# Patient Record
Sex: Male | Born: 1935
Health system: Southern US, Community
[De-identification: ages and names within clinical notes are randomized; demographics above are authoritative.]

## PROBLEM LIST (undated history)

## (undated) DIAGNOSIS — I219 Acute myocardial infarction, unspecified: Secondary | ICD-10-CM

## (undated) DIAGNOSIS — N189 Chronic kidney disease, unspecified: Secondary | ICD-10-CM

## (undated) DIAGNOSIS — M1990 Unspecified osteoarthritis, unspecified site: Secondary | ICD-10-CM

## (undated) DIAGNOSIS — D649 Anemia, unspecified: Secondary | ICD-10-CM

## (undated) DIAGNOSIS — F329 Major depressive disorder, single episode, unspecified: Secondary | ICD-10-CM

## (undated) DIAGNOSIS — I8392 Asymptomatic varicose veins of left lower extremity: Secondary | ICD-10-CM

## (undated) DIAGNOSIS — K219 Gastro-esophageal reflux disease without esophagitis: Secondary | ICD-10-CM

## (undated) DIAGNOSIS — R011 Cardiac murmur, unspecified: Secondary | ICD-10-CM

## (undated) DIAGNOSIS — F32A Depression, unspecified: Secondary | ICD-10-CM

## (undated) DIAGNOSIS — I10 Essential (primary) hypertension: Secondary | ICD-10-CM

## (undated) DIAGNOSIS — M109 Gout, unspecified: Secondary | ICD-10-CM

## (undated) DIAGNOSIS — H269 Unspecified cataract: Secondary | ICD-10-CM

## (undated) DIAGNOSIS — F419 Anxiety disorder, unspecified: Secondary | ICD-10-CM

## (undated) DIAGNOSIS — E119 Type 2 diabetes mellitus without complications: Secondary | ICD-10-CM

## (undated) HISTORY — PX: OTHER SURGICAL HISTORY: SHX169

## (undated) HISTORY — PX: BUNIONECTOMY: SHX129

---

## 2011-06-10 ENCOUNTER — Other Ambulatory Visit: Payer: Self-pay | Admitting: Hematology and Oncology

## 2011-06-10 ENCOUNTER — Encounter (HOSPITAL_BASED_OUTPATIENT_CLINIC_OR_DEPARTMENT_OTHER): Payer: Medicare Other | Admitting: Hematology and Oncology

## 2011-06-10 ENCOUNTER — Ambulatory Visit (HOSPITAL_COMMUNITY)
Admission: RE | Admit: 2011-06-10 | Discharge: 2011-06-10 | Disposition: A | Discharge status: Home / Self Care | Payer: Medicare Other | Source: Ambulatory Visit | Attending: Hematology and Oncology | Admitting: Hematology and Oncology

## 2011-06-10 DIAGNOSIS — R0602 Shortness of breath: Secondary | ICD-10-CM | POA: Insufficient documentation

## 2011-06-10 DIAGNOSIS — Z862 Personal history of diseases of the blood and blood-forming organs and certain disorders involving the immune mechanism: Secondary | ICD-10-CM

## 2011-06-10 DIAGNOSIS — F17200 Nicotine dependence, unspecified, uncomplicated: Secondary | ICD-10-CM

## 2011-06-10 DIAGNOSIS — Z87891 Personal history of nicotine dependence: Secondary | ICD-10-CM | POA: Insufficient documentation

## 2011-06-10 DIAGNOSIS — I10 Essential (primary) hypertension: Secondary | ICD-10-CM | POA: Insufficient documentation

## 2011-06-10 DIAGNOSIS — D539 Nutritional anemia, unspecified: Secondary | ICD-10-CM

## 2011-06-10 LAB — URINALYSIS, MICROSCOPIC - CHCC
Glucose: NEGATIVE g/dL
Nitrite: NEGATIVE
RBC count: NEGATIVE (ref 0–2)
Specific Gravity, Urine: 1.02 (ref 1.003–1.035)

## 2011-06-10 LAB — CBC & DIFF AND RETIC
BASO%: 0.3 % (ref 0.0–2.0)
Basophils Absolute: 0 10*3/uL (ref 0.0–0.1)
EOS%: 1.7 % (ref 0.0–7.0)
HGB: 10.3 g/dL — ABNORMAL LOW (ref 13.0–17.1)
MCH: 22 pg — ABNORMAL LOW (ref 27.2–33.4)
MCHC: 32.3 g/dL (ref 32.0–36.0)
MCV: 68 fL — ABNORMAL LOW (ref 79.3–98.0)
MONO%: 5.9 % (ref 0.0–14.0)
RDW: 17.8 % — ABNORMAL HIGH (ref 11.0–14.6)
Retic %: 1.62 % (ref 0.80–1.80)
Retic Ct Abs: 75.98 10*3/uL (ref 34.80–93.90)
lymph#: 1.9 10*3/uL (ref 0.9–3.3)

## 2011-06-15 LAB — PROTEIN ELECTROPHORESIS, SERUM, WITH REFLEX
Alpha-2-Globulin: 12.4 % — ABNORMAL HIGH (ref 7.1–11.8)
Total Protein, Serum Electrophoresis: 8 g/dL (ref 6.0–8.3)

## 2011-06-15 LAB — HEMOGLOBINOPATHY EVALUATION
Hemoglobin Other: 0 % (ref 0.0–0.0)
Hgb A2 Quant: 2.5 % (ref 2.2–3.2)
Hgb A: 97.5 % (ref 96.8–97.8)

## 2011-06-15 LAB — VITAMIN B12: Vitamin B-12: 419 pg/mL (ref 211–911)

## 2011-06-15 LAB — COMPREHENSIVE METABOLIC PANEL
Albumin: 4.6 g/dL (ref 3.5–5.2)
BUN: 23 mg/dL (ref 6–23)
CO2: 18 mEq/L — ABNORMAL LOW (ref 19–32)
Calcium: 10.1 mg/dL (ref 8.4–10.5)
Chloride: 110 mEq/L (ref 96–112)
Creatinine, Ser: 1.62 mg/dL — ABNORMAL HIGH (ref 0.50–1.35)

## 2011-06-15 LAB — HAPTOGLOBIN: Haptoglobin: 232 mg/dL — ABNORMAL HIGH (ref 30–200)

## 2011-06-15 LAB — IRON AND TIBC
%SAT: 10 % — ABNORMAL LOW (ref 20–55)
Iron: 43 ug/dL (ref 42–165)
TIBC: 447 ug/dL — ABNORMAL HIGH (ref 215–435)

## 2011-06-17 ENCOUNTER — Other Ambulatory Visit: Payer: Self-pay | Admitting: Hematology and Oncology

## 2011-06-17 ENCOUNTER — Encounter (HOSPITAL_BASED_OUTPATIENT_CLINIC_OR_DEPARTMENT_OTHER): Payer: Medicare Other | Admitting: Hematology and Oncology

## 2011-06-17 DIAGNOSIS — D539 Nutritional anemia, unspecified: Secondary | ICD-10-CM

## 2011-06-17 DIAGNOSIS — R112 Nausea with vomiting, unspecified: Secondary | ICD-10-CM

## 2011-06-17 DIAGNOSIS — N2589 Other disorders resulting from impaired renal tubular function: Secondary | ICD-10-CM

## 2011-06-23 ENCOUNTER — Other Ambulatory Visit: Payer: Self-pay | Admitting: Hematology and Oncology

## 2011-06-23 DIAGNOSIS — R634 Abnormal weight loss: Secondary | ICD-10-CM

## 2011-06-23 DIAGNOSIS — R05 Cough: Secondary | ICD-10-CM

## 2011-06-23 DIAGNOSIS — R9389 Abnormal findings on diagnostic imaging of other specified body structures: Secondary | ICD-10-CM

## 2011-06-24 ENCOUNTER — Encounter (HOSPITAL_BASED_OUTPATIENT_CLINIC_OR_DEPARTMENT_OTHER): Payer: Medicare Other | Admitting: Hematology and Oncology

## 2011-06-24 DIAGNOSIS — D509 Iron deficiency anemia, unspecified: Secondary | ICD-10-CM

## 2011-06-26 ENCOUNTER — Ambulatory Visit (HOSPITAL_COMMUNITY)
Admission: RE | Admit: 2011-06-26 | Discharge: 2011-06-26 | Disposition: A | Discharge status: Home / Self Care | Payer: Medicare Other | Source: Ambulatory Visit | Attending: Hematology and Oncology | Admitting: Hematology and Oncology

## 2011-06-26 DIAGNOSIS — R05 Cough: Secondary | ICD-10-CM

## 2011-06-26 DIAGNOSIS — J438 Other emphysema: Secondary | ICD-10-CM | POA: Insufficient documentation

## 2011-06-26 DIAGNOSIS — R9389 Abnormal findings on diagnostic imaging of other specified body structures: Secondary | ICD-10-CM | POA: Insufficient documentation

## 2011-06-26 DIAGNOSIS — R634 Abnormal weight loss: Secondary | ICD-10-CM | POA: Insufficient documentation

## 2011-06-26 DIAGNOSIS — E041 Nontoxic single thyroid nodule: Secondary | ICD-10-CM | POA: Insufficient documentation

## 2011-06-26 DIAGNOSIS — D649 Anemia, unspecified: Secondary | ICD-10-CM | POA: Insufficient documentation

## 2011-06-26 DIAGNOSIS — R059 Cough, unspecified: Secondary | ICD-10-CM | POA: Insufficient documentation

## 2011-06-26 MED ORDER — IOHEXOL 300 MG/ML  SOLN
50.0000 mL | Freq: Once | INTRAMUSCULAR | Status: AC | PRN
  Administered 2011-06-26: 50 mL via INTRAVENOUS

## 2011-11-04 DIAGNOSIS — J00 Acute nasopharyngitis [common cold]: Secondary | ICD-10-CM | POA: Diagnosis not present

## 2011-11-04 DIAGNOSIS — Z79899 Other long term (current) drug therapy: Secondary | ICD-10-CM | POA: Diagnosis not present

## 2011-11-04 DIAGNOSIS — IMO0001 Reserved for inherently not codable concepts without codable children: Secondary | ICD-10-CM | POA: Diagnosis not present

## 2011-11-04 DIAGNOSIS — M109 Gout, unspecified: Secondary | ICD-10-CM | POA: Diagnosis not present

## 2011-11-04 DIAGNOSIS — I10 Essential (primary) hypertension: Secondary | ICD-10-CM | POA: Diagnosis not present

## 2011-12-02 ENCOUNTER — Other Ambulatory Visit (HOSPITAL_BASED_OUTPATIENT_CLINIC_OR_DEPARTMENT_OTHER): Payer: Medicare Other | Admitting: Lab

## 2011-12-02 DIAGNOSIS — D539 Nutritional anemia, unspecified: Secondary | ICD-10-CM

## 2011-12-02 LAB — CBC WITH DIFFERENTIAL/PLATELET
Basophils Absolute: 0 10*3/uL (ref 0.0–0.1)
EOS%: 3.9 % (ref 0.0–7.0)
Eosinophils Absolute: 0.3 10*3/uL (ref 0.0–0.5)
HGB: 9.3 g/dL — ABNORMAL LOW (ref 13.0–17.1)
LYMPH%: 32.5 % (ref 14.0–49.0)
MCH: 23.5 pg — ABNORMAL LOW (ref 27.2–33.4)
MCV: 74 fL — ABNORMAL LOW (ref 79.3–98.0)
MONO%: 9.9 % (ref 0.0–14.0)
NEUT#: 3.7 10*3/uL (ref 1.5–6.5)
Platelets: 215 10*3/uL (ref 140–400)
RBC: 3.98 10*6/uL — ABNORMAL LOW (ref 4.20–5.82)

## 2011-12-03 LAB — COMPREHENSIVE METABOLIC PANEL
Alkaline Phosphatase: 122 U/L — ABNORMAL HIGH (ref 39–117)
CO2: 17 mEq/L — ABNORMAL LOW (ref 19–32)
Creatinine, Ser: 1.68 mg/dL — ABNORMAL HIGH (ref 0.50–1.35)
Glucose, Bld: 115 mg/dL — ABNORMAL HIGH (ref 70–99)
Total Bilirubin: 0.2 mg/dL — ABNORMAL LOW (ref 0.3–1.2)

## 2011-12-03 LAB — IRON AND TIBC
%SAT: 17 % — ABNORMAL LOW (ref 20–55)
Iron: 60 ug/dL (ref 42–165)
TIBC: 347 ug/dL (ref 215–435)
UIBC: 287 ug/dL (ref 125–400)

## 2011-12-03 LAB — VITAMIN B12: Vitamin B-12: 424 pg/mL (ref 211–911)

## 2011-12-03 LAB — FERRITIN: Ferritin: 113 ng/mL (ref 22–322)

## 2011-12-09 ENCOUNTER — Encounter: Payer: Self-pay | Admitting: Hematology and Oncology

## 2011-12-09 ENCOUNTER — Telehealth: Payer: Self-pay | Admitting: Hematology and Oncology

## 2011-12-09 ENCOUNTER — Ambulatory Visit (HOSPITAL_BASED_OUTPATIENT_CLINIC_OR_DEPARTMENT_OTHER): Payer: Medicare Other | Admitting: Hematology and Oncology

## 2011-12-09 VITALS — BP 118/69 | HR 72 | Temp 97.7°F | Ht 72.0 in | Wt 205.0 lb

## 2011-12-09 DIAGNOSIS — D509 Iron deficiency anemia, unspecified: Secondary | ICD-10-CM | POA: Diagnosis not present

## 2011-12-09 DIAGNOSIS — N289 Disorder of kidney and ureter, unspecified: Secondary | ICD-10-CM

## 2011-12-09 DIAGNOSIS — D638 Anemia in other chronic diseases classified elsewhere: Secondary | ICD-10-CM | POA: Diagnosis not present

## 2011-12-09 DIAGNOSIS — R3129 Other microscopic hematuria: Secondary | ICD-10-CM

## 2011-12-09 DIAGNOSIS — E119 Type 2 diabetes mellitus without complications: Secondary | ICD-10-CM | POA: Insufficient documentation

## 2011-12-09 DIAGNOSIS — D539 Nutritional anemia, unspecified: Secondary | ICD-10-CM

## 2011-12-09 DIAGNOSIS — Z794 Long term (current) use of insulin: Secondary | ICD-10-CM | POA: Insufficient documentation

## 2011-12-09 NOTE — Progress Notes (Signed)
CC:   Robyn N. Allyne Gee, M.D.  IDENTIFYING STATEMENT:  The patient is a 78 year old man with anemia who presents for followup.  INTERVAL HISTORY:  [name] was last seen 6 months ago.  He notes he has gained weight.  He is eating well.  He denies pain.  He denies overt blood loss, but does have history of chronic hematuria.  He has yet not established care with a urologist in this area and would like to do so. He is taking over-the-counter iron 1 daily which he is tolerating very well with no GI upset.  He has good energy levels.  He had received a CT scan at his last visit in October 2012 because I was concerned about some peribronchial thickening, but there was no evidence of malignancy. The thoracic spine did show some lucent lesions, but myeloma markers were unremarkable.  MEDICATIONS:  Reviewed and updated.  ALLERGIES:  None.  PHYSICAL EXAM:  The patient is a well-appearing, well-nourished man in no distress.  Vitals:  Pulse 72, blood pressure 108/69, temp 97.7, respirations 20, weight 205 pounds.  HEENT:  Head is atraumatic, normocephalic.  Sclerae anicteric.  Mouth moist.  Neck:  Supple.  Chest: Clear.  Cardiovascular:  Unremarkable.  Abdomen:  Soft, nontender. Bowel sounds present.  Extremities:  No edema.  LAB DATA:  12/02/2011 white cell count 6.9, hemoglobin 9.3, hematocrit 29.4, platelets 215, MCV 74.  Sodium 139, potassium 5.4, chloride 112, CO2 17, glucose 115, BUN 24, creatinine 1.68, T bilirubin 0.2, alkaline phosphatase 122, AST 19, ALT 15, calcium 10, ferritin 113, iron 60, TIBC 247, saturation 17%, B12 424.  IMPRESSION/PLAN:  Mr. Hegeman is a 78 year old man with: 1. Iron deficiency anemia with microscopic hematuria.  His ferritin     store is adequate, so I have asked that he continue with oral iron     as he is already doing. 2. Anemia of chronic disease with underlying diabetes and renal     insufficiency. 3. The patient has microscopic hematuria and will  assist him with a     referral to urology.  He follows up in 9 months' time.    ______________________________ Laurice Record, M.D. LIO/MEDQ  D:  12/09/2011  T:  12/09/2011  Job:  [id number]

## 2011-12-09 NOTE — Progress Notes (Signed)
This office note has been dictated.

## 2011-12-09 NOTE — Telephone Encounter (Signed)
gv pt appt schedule for jan 2014. Also gv pt appt w/dr ottelin for 4/30 @ 9:15 am. 1st available appt was w/dr ottelin otherwise next new pt appt was July. Urology appt made per 4/17 pof.

## 2011-12-09 NOTE — Patient Instructions (Signed)
Patient to follow up as instructed.   Current Outpatient Prescriptions  Medication Sig Dispense Refill  . allopurinol (ZYLOPRIM) 100 MG tablet Take 100 mg by mouth 2 (two) times daily as needed.      Marland Kitchen amLODipine (NORVASC) 5 MG tablet Take 5 mg by mouth daily.      Marland Kitchen aspirin 81 MG tablet Take 81 mg by mouth daily.      . carvedilol (COREG) 25 MG tablet Take 25 mg by mouth 2 (two) times daily with a meal.      . clopidogrel (PLAVIX) 75 MG tablet Take 75 mg by mouth daily.      Marland Kitchen omeprazole (PRILOSEC) 20 MG capsule Take 20 mg by mouth daily.      . sitaGLIPtan-metformin (JANUMET) 50-1000 MG per tablet Take 1 tablet by mouth 2 (two) times daily with a meal.      . Tamsulosin HCl (FLOMAX) 0.4 MG CAPS Take 0.4 mg by mouth 2 (two) times daily.            April 2013  Sunday Monday Tuesday Wednesday Thursday Friday Saturday      1   2   3   4   5   6    7   8   9   10    LAB MO     1:45 PM  (15 min.)  Windell Hummingbird  [facility] 11   12   13    14   15   16   17    EST PT 30  11:30 AM  (30 min.)  Laurice Record, MD  [facility] 18   19   20    21   22   23   24   25   26   27    28   29    30

## 2011-12-22 DIAGNOSIS — N281 Cyst of kidney, acquired: Secondary | ICD-10-CM | POA: Diagnosis not present

## 2011-12-22 DIAGNOSIS — N529 Male erectile dysfunction, unspecified: Secondary | ICD-10-CM | POA: Diagnosis not present

## 2011-12-22 DIAGNOSIS — R3129 Other microscopic hematuria: Secondary | ICD-10-CM | POA: Diagnosis not present

## 2011-12-22 DIAGNOSIS — E291 Testicular hypofunction: Secondary | ICD-10-CM | POA: Diagnosis not present

## 2011-12-22 DIAGNOSIS — N401 Enlarged prostate with lower urinary tract symptoms: Secondary | ICD-10-CM | POA: Diagnosis not present

## 2012-02-04 DIAGNOSIS — D6489 Other specified anemias: Secondary | ICD-10-CM | POA: Diagnosis not present

## 2012-02-04 DIAGNOSIS — Z79899 Other long term (current) drug therapy: Secondary | ICD-10-CM | POA: Diagnosis not present

## 2012-02-04 DIAGNOSIS — M109 Gout, unspecified: Secondary | ICD-10-CM | POA: Diagnosis not present

## 2012-02-04 DIAGNOSIS — I10 Essential (primary) hypertension: Secondary | ICD-10-CM | POA: Diagnosis not present

## 2012-02-05 DIAGNOSIS — Z79899 Other long term (current) drug therapy: Secondary | ICD-10-CM | POA: Diagnosis not present

## 2012-02-05 DIAGNOSIS — D6489 Other specified anemias: Secondary | ICD-10-CM | POA: Diagnosis not present

## 2012-02-05 DIAGNOSIS — M109 Gout, unspecified: Secondary | ICD-10-CM | POA: Diagnosis not present

## 2012-02-12 DIAGNOSIS — I70209 Unspecified atherosclerosis of native arteries of extremities, unspecified extremity: Secondary | ICD-10-CM | POA: Diagnosis not present

## 2012-02-12 DIAGNOSIS — M722 Plantar fascial fibromatosis: Secondary | ICD-10-CM | POA: Diagnosis not present

## 2012-02-12 DIAGNOSIS — B351 Tinea unguium: Secondary | ICD-10-CM | POA: Diagnosis not present

## 2012-02-12 DIAGNOSIS — M79609 Pain in unspecified limb: Secondary | ICD-10-CM | POA: Diagnosis not present

## 2012-02-12 DIAGNOSIS — E1159 Type 2 diabetes mellitus with other circulatory complications: Secondary | ICD-10-CM | POA: Diagnosis not present

## 2012-02-26 DIAGNOSIS — E213 Hyperparathyroidism, unspecified: Secondary | ICD-10-CM | POA: Diagnosis not present

## 2012-02-26 DIAGNOSIS — E119 Type 2 diabetes mellitus without complications: Secondary | ICD-10-CM | POA: Diagnosis not present

## 2012-02-26 DIAGNOSIS — I129 Hypertensive chronic kidney disease with stage 1 through stage 4 chronic kidney disease, or unspecified chronic kidney disease: Secondary | ICD-10-CM | POA: Diagnosis not present

## 2012-02-26 DIAGNOSIS — N183 Chronic kidney disease, stage 3 unspecified: Secondary | ICD-10-CM | POA: Diagnosis not present

## 2012-02-26 DIAGNOSIS — I120 Hypertensive chronic kidney disease with stage 5 chronic kidney disease or end stage renal disease: Secondary | ICD-10-CM | POA: Diagnosis not present

## 2012-02-26 DIAGNOSIS — D509 Iron deficiency anemia, unspecified: Secondary | ICD-10-CM | POA: Diagnosis not present

## 2012-02-26 DIAGNOSIS — D649 Anemia, unspecified: Secondary | ICD-10-CM | POA: Diagnosis not present

## 2012-03-03 DIAGNOSIS — D509 Iron deficiency anemia, unspecified: Secondary | ICD-10-CM | POA: Diagnosis not present

## 2012-04-04 DIAGNOSIS — I129 Hypertensive chronic kidney disease with stage 1 through stage 4 chronic kidney disease, or unspecified chronic kidney disease: Secondary | ICD-10-CM | POA: Diagnosis not present

## 2012-04-04 DIAGNOSIS — M109 Gout, unspecified: Secondary | ICD-10-CM | POA: Diagnosis not present

## 2012-04-04 DIAGNOSIS — D649 Anemia, unspecified: Secondary | ICD-10-CM | POA: Diagnosis not present

## 2012-05-11 DIAGNOSIS — Z8249 Family history of ischemic heart disease and other diseases of the circulatory system: Secondary | ICD-10-CM | POA: Diagnosis not present

## 2012-05-11 DIAGNOSIS — Z833 Family history of diabetes mellitus: Secondary | ICD-10-CM | POA: Diagnosis not present

## 2012-05-11 DIAGNOSIS — I10 Essential (primary) hypertension: Secondary | ICD-10-CM | POA: Diagnosis not present

## 2012-05-18 DIAGNOSIS — Z0000 Encounter for general adult medical examination without abnormal findings: Secondary | ICD-10-CM | POA: Diagnosis not present

## 2012-05-18 DIAGNOSIS — M889 Osteitis deformans of unspecified bone: Secondary | ICD-10-CM | POA: Diagnosis not present

## 2012-05-18 DIAGNOSIS — E559 Vitamin D deficiency, unspecified: Secondary | ICD-10-CM | POA: Diagnosis not present

## 2012-05-18 DIAGNOSIS — Z125 Encounter for screening for malignant neoplasm of prostate: Secondary | ICD-10-CM | POA: Diagnosis not present

## 2012-05-18 DIAGNOSIS — I10 Essential (primary) hypertension: Secondary | ICD-10-CM | POA: Diagnosis not present

## 2012-05-18 DIAGNOSIS — Z79899 Other long term (current) drug therapy: Secondary | ICD-10-CM | POA: Diagnosis not present

## 2012-05-18 DIAGNOSIS — H6120 Impacted cerumen, unspecified ear: Secondary | ICD-10-CM | POA: Diagnosis not present

## 2012-05-18 DIAGNOSIS — D6489 Other specified anemias: Secondary | ICD-10-CM | POA: Diagnosis not present

## 2012-06-06 DIAGNOSIS — Z23 Encounter for immunization: Secondary | ICD-10-CM | POA: Diagnosis not present

## 2012-07-18 DIAGNOSIS — I10 Essential (primary) hypertension: Secondary | ICD-10-CM | POA: Diagnosis not present

## 2012-07-18 DIAGNOSIS — M889 Osteitis deformans of unspecified bone: Secondary | ICD-10-CM | POA: Diagnosis not present

## 2012-07-18 DIAGNOSIS — E119 Type 2 diabetes mellitus without complications: Secondary | ICD-10-CM | POA: Diagnosis not present

## 2012-07-20 DIAGNOSIS — H43399 Other vitreous opacities, unspecified eye: Secondary | ICD-10-CM | POA: Diagnosis not present

## 2012-07-20 DIAGNOSIS — H538 Other visual disturbances: Secondary | ICD-10-CM | POA: Diagnosis not present

## 2012-07-20 DIAGNOSIS — H2510 Age-related nuclear cataract, unspecified eye: Secondary | ICD-10-CM | POA: Diagnosis not present

## 2012-07-20 DIAGNOSIS — H524 Presbyopia: Secondary | ICD-10-CM | POA: Diagnosis not present

## 2012-07-20 DIAGNOSIS — D492 Neoplasm of unspecified behavior of bone, soft tissue, and skin: Secondary | ICD-10-CM | POA: Diagnosis not present

## 2012-08-13 ENCOUNTER — Telehealth: Payer: Self-pay | Admitting: Oncology

## 2012-08-13 NOTE — Telephone Encounter (Signed)
s/w pt and he is aware dr lo is leaving and has been re assign to dr g/lisa,new appt 09/07/2012      anne

## 2012-08-27 ENCOUNTER — Telehealth: Payer: Self-pay | Admitting: Oncology

## 2012-08-27 ENCOUNTER — Encounter: Payer: Self-pay | Admitting: Oncology

## 2012-08-29 ENCOUNTER — Telehealth: Payer: Self-pay | Admitting: Oncology

## 2012-08-29 NOTE — Telephone Encounter (Signed)
Left Message- advised pt on change in appt due to provider schedule changes.Marland KitchenMarland KitchenMarland Kitchen

## 2012-09-02 ENCOUNTER — Other Ambulatory Visit: Payer: Medicare Other | Admitting: Lab

## 2012-09-06 ENCOUNTER — Ambulatory Visit: Payer: Medicare Other | Admitting: Hematology and Oncology

## 2012-09-07 ENCOUNTER — Ambulatory Visit: Payer: Medicare Other | Admitting: Nurse Practitioner

## 2012-09-09 ENCOUNTER — Telehealth: Payer: Self-pay | Admitting: Oncology

## 2012-09-26 DIAGNOSIS — N400 Enlarged prostate without lower urinary tract symptoms: Secondary | ICD-10-CM | POA: Diagnosis not present

## 2012-09-26 DIAGNOSIS — E782 Mixed hyperlipidemia: Secondary | ICD-10-CM | POA: Diagnosis not present

## 2012-09-26 DIAGNOSIS — I10 Essential (primary) hypertension: Secondary | ICD-10-CM | POA: Diagnosis not present

## 2012-09-28 ENCOUNTER — Other Ambulatory Visit (HOSPITAL_BASED_OUTPATIENT_CLINIC_OR_DEPARTMENT_OTHER): Payer: Medicare Other | Admitting: Lab

## 2012-09-28 ENCOUNTER — Other Ambulatory Visit: Payer: Self-pay | Admitting: Nurse Practitioner

## 2012-09-28 DIAGNOSIS — D539 Nutritional anemia, unspecified: Secondary | ICD-10-CM

## 2012-09-29 LAB — CBC WITH DIFFERENTIAL/PLATELET
BASO%: 0.4 % (ref 0.0–2.0)
EOS%: 2.1 % (ref 0.0–7.0)
HCT: 31 % — ABNORMAL LOW (ref 38.4–49.9)
MCH: 22.5 pg — ABNORMAL LOW (ref 27.2–33.4)
MCHC: 31.9 g/dL — ABNORMAL LOW (ref 32.0–36.0)
MONO%: 8.8 % (ref 0.0–14.0)
NEUT%: 59.3 % (ref 39.0–75.0)
RDW: 18 % — ABNORMAL HIGH (ref 11.0–14.6)
lymph#: 2.1 10*3/uL (ref 0.9–3.3)

## 2012-09-29 LAB — TECHNOLOGIST REVIEW

## 2012-09-30 ENCOUNTER — Ambulatory Visit: Payer: Medicare Other | Admitting: Nurse Practitioner

## 2012-10-06 ENCOUNTER — Ambulatory Visit: Payer: Medicare Other | Admitting: Nurse Practitioner

## 2012-10-06 ENCOUNTER — Telehealth: Payer: Self-pay | Admitting: *Deleted

## 2012-10-06 NOTE — Telephone Encounter (Signed)
Not coming to appointment today. Office to reschedule

## 2012-10-07 ENCOUNTER — Other Ambulatory Visit: Payer: Medicare Other | Admitting: Lab

## 2012-10-10 ENCOUNTER — Ambulatory Visit: Payer: Medicare Other | Admitting: Nurse Practitioner

## 2012-10-10 ENCOUNTER — Other Ambulatory Visit: Payer: Medicare Other | Admitting: Lab

## 2012-10-25 DIAGNOSIS — M109 Gout, unspecified: Secondary | ICD-10-CM | POA: Diagnosis not present

## 2012-10-25 DIAGNOSIS — E119 Type 2 diabetes mellitus without complications: Secondary | ICD-10-CM | POA: Diagnosis not present

## 2012-10-25 DIAGNOSIS — I10 Essential (primary) hypertension: Secondary | ICD-10-CM | POA: Diagnosis not present

## 2012-10-25 DIAGNOSIS — N2581 Secondary hyperparathyroidism of renal origin: Secondary | ICD-10-CM | POA: Diagnosis not present

## 2012-10-25 DIAGNOSIS — D649 Anemia, unspecified: Secondary | ICD-10-CM | POA: Diagnosis not present

## 2012-10-25 DIAGNOSIS — E213 Hyperparathyroidism, unspecified: Secondary | ICD-10-CM | POA: Diagnosis not present

## 2012-10-25 DIAGNOSIS — I129 Hypertensive chronic kidney disease with stage 1 through stage 4 chronic kidney disease, or unspecified chronic kidney disease: Secondary | ICD-10-CM | POA: Diagnosis not present

## 2012-11-08 ENCOUNTER — Other Ambulatory Visit (HOSPITAL_COMMUNITY): Payer: Self-pay | Admitting: *Deleted

## 2012-11-09 ENCOUNTER — Encounter (HOSPITAL_COMMUNITY)
Admission: RE | Admit: 2012-11-09 | Discharge: 2012-11-09 | Disposition: A | Discharge status: Home / Self Care | Payer: Medicare Other | Source: Ambulatory Visit | Attending: Nephrology | Admitting: Nephrology

## 2012-11-09 VITALS — BP 123/75 | HR 77 | Temp 98.2°F | Resp 18 | Ht 71.0 in | Wt 204.0 lb

## 2012-11-09 DIAGNOSIS — N289 Disorder of kidney and ureter, unspecified: Secondary | ICD-10-CM

## 2012-11-09 DIAGNOSIS — D539 Nutritional anemia, unspecified: Secondary | ICD-10-CM | POA: Diagnosis not present

## 2012-11-09 MED ORDER — EPOETIN ALFA 20000 UNIT/ML IJ SOLN
INTRAMUSCULAR | Status: AC
  Filled 2012-11-09: qty 1

## 2012-11-09 MED ORDER — FERUMOXYTOL INJECTION 510 MG/17 ML
510.0000 mg | Freq: Once | INTRAVENOUS | Status: AC
  Administered 2012-11-09: 510 mg via INTRAVENOUS

## 2012-11-09 MED ORDER — EPOETIN ALFA 20000 UNIT/ML IJ SOLN
20000.0000 [IU] | INTRAMUSCULAR | Status: DC
  Administered 2012-11-09: 20000 [IU] via SUBCUTANEOUS

## 2012-11-09 MED ORDER — SODIUM CHLORIDE 0.9 % IV SOLN
Freq: Once | INTRAVENOUS | Status: AC
  Administered 2012-11-09: 13:00:00 via INTRAVENOUS

## 2012-11-15 DIAGNOSIS — I10 Essential (primary) hypertension: Secondary | ICD-10-CM | POA: Diagnosis not present

## 2012-11-15 DIAGNOSIS — M889 Osteitis deformans of unspecified bone: Secondary | ICD-10-CM | POA: Diagnosis not present

## 2012-11-30 ENCOUNTER — Encounter (HOSPITAL_COMMUNITY)
Admission: RE | Admit: 2012-11-30 | Discharge: 2012-11-30 | Disposition: A | Discharge status: Home / Self Care | Payer: Medicare Other | Source: Ambulatory Visit | Attending: Nephrology | Admitting: Nephrology

## 2012-11-30 VITALS — BP 145/63 | HR 86 | Temp 98.4°F

## 2012-11-30 DIAGNOSIS — N289 Disorder of kidney and ureter, unspecified: Secondary | ICD-10-CM | POA: Diagnosis not present

## 2012-11-30 LAB — IRON AND TIBC: Iron: 66 ug/dL (ref 42–135)

## 2012-11-30 LAB — POCT HEMOGLOBIN-HEMACUE: Hemoglobin: 10.3 g/dL — ABNORMAL LOW (ref 13.0–17.0)

## 2012-11-30 LAB — FERRITIN: Ferritin: 499 ng/mL — ABNORMAL HIGH (ref 22–322)

## 2012-11-30 MED ORDER — EPOETIN ALFA 20000 UNIT/ML IJ SOLN
INTRAMUSCULAR | Status: AC
  Filled 2012-11-30: qty 1

## 2012-11-30 MED ORDER — EPOETIN ALFA 20000 UNIT/ML IJ SOLN
20000.0000 [IU] | INTRAMUSCULAR | Status: DC
  Administered 2012-11-30: 20000 [IU] via SUBCUTANEOUS

## 2012-12-20 ENCOUNTER — Other Ambulatory Visit (HOSPITAL_COMMUNITY): Payer: Self-pay | Admitting: *Deleted

## 2012-12-21 ENCOUNTER — Encounter (HOSPITAL_COMMUNITY)
Admission: RE | Admit: 2012-12-21 | Discharge: 2012-12-21 | Disposition: A | Discharge status: Home / Self Care | Payer: Medicare Other | Source: Ambulatory Visit | Attending: Nephrology | Admitting: Nephrology

## 2012-12-21 VITALS — BP 121/74 | HR 67 | Temp 98.3°F | Resp 18 | Ht 71.0 in | Wt 204.0 lb

## 2012-12-21 DIAGNOSIS — N289 Disorder of kidney and ureter, unspecified: Secondary | ICD-10-CM

## 2012-12-21 LAB — IRON AND TIBC
Iron: 66 ug/dL (ref 42–135)
Saturation Ratios: 18 % — ABNORMAL LOW (ref 20–55)
TIBC: 372 ug/dL (ref 215–435)

## 2012-12-21 LAB — POCT HEMOGLOBIN-HEMACUE: Hemoglobin: 11.1 g/dL — ABNORMAL LOW (ref 13.0–17.0)

## 2012-12-21 MED ORDER — FERUMOXYTOL INJECTION 510 MG/17 ML
510.0000 mg | Freq: Once | INTRAVENOUS | Status: AC
  Administered 2012-12-21: 510 mg via INTRAVENOUS

## 2012-12-21 MED ORDER — SODIUM CHLORIDE 0.9 % IV SOLN
INTRAVENOUS | Status: DC
  Administered 2012-12-21: 11:00:00 via INTRAVENOUS

## 2012-12-21 MED ORDER — EPOETIN ALFA 20000 UNIT/ML IJ SOLN
20000.0000 [IU] | INTRAMUSCULAR | Status: DC
  Administered 2012-12-21: 20000 [IU] via SUBCUTANEOUS

## 2012-12-21 MED ORDER — EPOETIN ALFA 20000 UNIT/ML IJ SOLN
INTRAMUSCULAR | Status: AC
  Filled 2012-12-21: qty 1

## 2012-12-26 DIAGNOSIS — E782 Mixed hyperlipidemia: Secondary | ICD-10-CM | POA: Diagnosis not present

## 2012-12-26 DIAGNOSIS — I10 Essential (primary) hypertension: Secondary | ICD-10-CM | POA: Diagnosis not present

## 2012-12-26 DIAGNOSIS — E119 Type 2 diabetes mellitus without complications: Secondary | ICD-10-CM | POA: Diagnosis not present

## 2012-12-26 DIAGNOSIS — M109 Gout, unspecified: Secondary | ICD-10-CM | POA: Diagnosis not present

## 2012-12-26 DIAGNOSIS — D649 Anemia, unspecified: Secondary | ICD-10-CM | POA: Diagnosis not present

## 2013-01-04 DIAGNOSIS — I10 Essential (primary) hypertension: Secondary | ICD-10-CM | POA: Diagnosis not present

## 2013-01-04 DIAGNOSIS — R5383 Other fatigue: Secondary | ICD-10-CM | POA: Diagnosis not present

## 2013-01-04 DIAGNOSIS — R5381 Other malaise: Secondary | ICD-10-CM | POA: Diagnosis not present

## 2013-01-04 DIAGNOSIS — E669 Obesity, unspecified: Secondary | ICD-10-CM | POA: Diagnosis not present

## 2013-01-11 ENCOUNTER — Encounter (HOSPITAL_COMMUNITY): Payer: Medicare Other

## 2013-01-17 ENCOUNTER — Other Ambulatory Visit (HOSPITAL_COMMUNITY): Payer: Self-pay

## 2013-01-18 ENCOUNTER — Encounter (HOSPITAL_COMMUNITY)
Admission: RE | Admit: 2013-01-18 | Discharge: 2013-01-18 | Disposition: A | Discharge status: Home / Self Care | Payer: Medicare Other | Source: Ambulatory Visit | Attending: Nephrology | Admitting: Nephrology

## 2013-01-18 VITALS — BP 120/78 | HR 69 | Temp 98.2°F | Resp 18 | Ht 71.0 in | Wt 204.0 lb

## 2013-01-18 DIAGNOSIS — N289 Disorder of kidney and ureter, unspecified: Secondary | ICD-10-CM | POA: Diagnosis not present

## 2013-01-18 LAB — IRON AND TIBC
Iron: 89 ug/dL (ref 42–135)
Saturation Ratios: 26 % (ref 20–55)
TIBC: 337 ug/dL (ref 215–435)

## 2013-01-18 MED ORDER — SODIUM CHLORIDE 0.9 % IV SOLN
INTRAVENOUS | Status: DC
  Administered 2013-01-18: 11:00:00 via INTRAVENOUS

## 2013-01-18 MED ORDER — FERUMOXYTOL INJECTION 510 MG/17 ML: 510.0000 mg | Freq: Once | INTRAVENOUS | Status: AC

## 2013-01-18 MED ORDER — FERUMOXYTOL INJECTION 510 MG/17 ML
INTRAVENOUS | Status: AC
  Administered 2013-01-18: 510 mg via INTRAVENOUS
  Filled 2013-01-18: qty 17

## 2013-01-18 MED ORDER — EPOETIN ALFA 20000 UNIT/ML IJ SOLN
20000.0000 [IU] | INTRAMUSCULAR | Status: DC
  Administered 2013-01-18: 20000 [IU] via SUBCUTANEOUS

## 2013-02-09 DIAGNOSIS — N401 Enlarged prostate with lower urinary tract symptoms: Secondary | ICD-10-CM | POA: Diagnosis not present

## 2013-02-14 ENCOUNTER — Other Ambulatory Visit (HOSPITAL_COMMUNITY): Payer: Self-pay | Admitting: *Deleted

## 2013-02-14 DIAGNOSIS — I129 Hypertensive chronic kidney disease with stage 1 through stage 4 chronic kidney disease, or unspecified chronic kidney disease: Secondary | ICD-10-CM | POA: Diagnosis not present

## 2013-02-14 DIAGNOSIS — E119 Type 2 diabetes mellitus without complications: Secondary | ICD-10-CM | POA: Diagnosis not present

## 2013-02-14 DIAGNOSIS — E213 Hyperparathyroidism, unspecified: Secondary | ICD-10-CM | POA: Diagnosis not present

## 2013-02-14 DIAGNOSIS — I10 Essential (primary) hypertension: Secondary | ICD-10-CM | POA: Diagnosis not present

## 2013-02-14 DIAGNOSIS — N401 Enlarged prostate with lower urinary tract symptoms: Secondary | ICD-10-CM | POA: Diagnosis not present

## 2013-02-14 DIAGNOSIS — N2581 Secondary hyperparathyroidism of renal origin: Secondary | ICD-10-CM | POA: Diagnosis not present

## 2013-02-14 DIAGNOSIS — D649 Anemia, unspecified: Secondary | ICD-10-CM | POA: Diagnosis not present

## 2013-02-15 ENCOUNTER — Encounter (HOSPITAL_COMMUNITY): Payer: Medicare Other

## 2013-04-19 ENCOUNTER — Encounter (HOSPITAL_COMMUNITY)
Admission: RE | Admit: 2013-04-19 | Discharge: 2013-04-19 | Disposition: A | Discharge status: Home / Self Care | Payer: Medicare Other | Source: Ambulatory Visit | Attending: Nephrology | Admitting: Nephrology

## 2013-04-19 VITALS — BP 108/71 | HR 72 | Resp 18

## 2013-04-19 DIAGNOSIS — N189 Chronic kidney disease, unspecified: Secondary | ICD-10-CM | POA: Insufficient documentation

## 2013-04-19 DIAGNOSIS — E119 Type 2 diabetes mellitus without complications: Secondary | ICD-10-CM | POA: Insufficient documentation

## 2013-04-19 DIAGNOSIS — N289 Disorder of kidney and ureter, unspecified: Secondary | ICD-10-CM

## 2013-04-19 DIAGNOSIS — D539 Nutritional anemia, unspecified: Secondary | ICD-10-CM | POA: Diagnosis not present

## 2013-04-19 LAB — IRON AND TIBC
Saturation Ratios: 33 % (ref 20–55)
UIBC: 213 ug/dL (ref 125–400)

## 2013-04-19 LAB — POCT HEMOGLOBIN-HEMACUE: Hemoglobin: 9.6 g/dL — ABNORMAL LOW (ref 13.0–17.0)

## 2013-04-19 LAB — FERRITIN: Ferritin: 924 ng/mL — ABNORMAL HIGH (ref 22–322)

## 2013-04-19 MED ORDER — EPOETIN ALFA 20000 UNIT/ML IJ SOLN: 20000.0000 [IU] | INTRAMUSCULAR | Status: DC

## 2013-04-19 MED ORDER — EPOETIN ALFA 20000 UNIT/ML IJ SOLN
15000.0000 [IU] | Freq: Once | INTRAMUSCULAR | Status: AC
Start: 2013-04-19 — End: 2013-04-19
  Administered 2013-04-19: 15000 [IU] via SUBCUTANEOUS

## 2013-04-19 MED ORDER — EPOETIN ALFA 20000 UNIT/ML IJ SOLN
INTRAMUSCULAR | Status: AC
  Filled 2013-04-19: qty 1

## 2013-05-16 ENCOUNTER — Other Ambulatory Visit (HOSPITAL_COMMUNITY): Payer: Self-pay | Admitting: *Deleted

## 2013-05-17 ENCOUNTER — Encounter (HOSPITAL_COMMUNITY)
Admission: RE | Admit: 2013-05-17 | Discharge: 2013-05-17 | Disposition: A | Discharge status: Home / Self Care | Payer: Medicare Other | Source: Ambulatory Visit | Attending: Nephrology | Admitting: Nephrology

## 2013-05-17 VITALS — BP 126/69 | HR 49 | Resp 18

## 2013-05-17 DIAGNOSIS — N289 Disorder of kidney and ureter, unspecified: Secondary | ICD-10-CM | POA: Insufficient documentation

## 2013-05-17 LAB — IRON AND TIBC
Iron: 71 ug/dL (ref 42–135)
TIBC: 322 ug/dL (ref 215–435)

## 2013-05-17 LAB — POCT HEMOGLOBIN-HEMACUE: Hemoglobin: 10.4 g/dL — ABNORMAL LOW (ref 13.0–17.0)

## 2013-05-17 LAB — FERRITIN: Ferritin: 811 ng/mL — ABNORMAL HIGH (ref 22–322)

## 2013-05-17 MED ORDER — EPOETIN ALFA 20000 UNIT/ML IJ SOLN
INTRAMUSCULAR | Status: AC
  Filled 2013-05-17: qty 1

## 2013-05-17 MED ORDER — EPOETIN ALFA 20000 UNIT/ML IJ SOLN
20000.0000 [IU] | INTRAMUSCULAR | Status: DC
  Administered 2013-05-17: 20000 [IU] via SUBCUTANEOUS

## 2013-05-18 DIAGNOSIS — I10 Essential (primary) hypertension: Secondary | ICD-10-CM | POA: Diagnosis not present

## 2013-05-18 DIAGNOSIS — E119 Type 2 diabetes mellitus without complications: Secondary | ICD-10-CM | POA: Diagnosis not present

## 2013-05-18 DIAGNOSIS — Z23 Encounter for immunization: Secondary | ICD-10-CM | POA: Diagnosis not present

## 2013-05-18 DIAGNOSIS — I129 Hypertensive chronic kidney disease with stage 1 through stage 4 chronic kidney disease, or unspecified chronic kidney disease: Secondary | ICD-10-CM | POA: Diagnosis not present

## 2013-05-22 DIAGNOSIS — E119 Type 2 diabetes mellitus without complications: Secondary | ICD-10-CM | POA: Diagnosis not present

## 2013-05-22 DIAGNOSIS — N400 Enlarged prostate without lower urinary tract symptoms: Secondary | ICD-10-CM | POA: Diagnosis not present

## 2013-05-22 DIAGNOSIS — M109 Gout, unspecified: Secondary | ICD-10-CM | POA: Diagnosis not present

## 2013-05-22 DIAGNOSIS — Z23 Encounter for immunization: Secondary | ICD-10-CM | POA: Diagnosis not present

## 2013-05-22 DIAGNOSIS — K219 Gastro-esophageal reflux disease without esophagitis: Secondary | ICD-10-CM | POA: Diagnosis not present

## 2013-05-22 DIAGNOSIS — B360 Pityriasis versicolor: Secondary | ICD-10-CM | POA: Diagnosis not present

## 2013-05-22 DIAGNOSIS — Z0100 Encounter for examination of eyes and vision without abnormal findings: Secondary | ICD-10-CM | POA: Diagnosis not present

## 2013-05-22 DIAGNOSIS — Z0110 Encounter for examination of ears and hearing without abnormal findings: Secondary | ICD-10-CM | POA: Diagnosis not present

## 2013-05-22 DIAGNOSIS — Z79899 Other long term (current) drug therapy: Secondary | ICD-10-CM | POA: Diagnosis not present

## 2013-05-22 DIAGNOSIS — I10 Essential (primary) hypertension: Secondary | ICD-10-CM | POA: Diagnosis not present

## 2013-05-22 DIAGNOSIS — E782 Mixed hyperlipidemia: Secondary | ICD-10-CM | POA: Diagnosis not present

## 2013-05-22 DIAGNOSIS — Z0000 Encounter for general adult medical examination without abnormal findings: Secondary | ICD-10-CM | POA: Diagnosis not present

## 2013-05-23 DIAGNOSIS — R9431 Abnormal electrocardiogram [ECG] [EKG]: Secondary | ICD-10-CM | POA: Diagnosis not present

## 2013-05-23 DIAGNOSIS — R0789 Other chest pain: Secondary | ICD-10-CM | POA: Diagnosis not present

## 2013-05-23 DIAGNOSIS — I10 Essential (primary) hypertension: Secondary | ICD-10-CM | POA: Diagnosis not present

## 2013-05-23 DIAGNOSIS — E785 Hyperlipidemia, unspecified: Secondary | ICD-10-CM | POA: Diagnosis not present

## 2013-05-26 DIAGNOSIS — R972 Elevated prostate specific antigen [PSA]: Secondary | ICD-10-CM | POA: Diagnosis not present

## 2013-05-26 DIAGNOSIS — N401 Enlarged prostate with lower urinary tract symptoms: Secondary | ICD-10-CM | POA: Diagnosis not present

## 2013-06-05 DIAGNOSIS — R1900 Intra-abdominal and pelvic swelling, mass and lump, unspecified site: Secondary | ICD-10-CM | POA: Diagnosis not present

## 2013-06-08 DIAGNOSIS — I10 Essential (primary) hypertension: Secondary | ICD-10-CM | POA: Diagnosis not present

## 2013-06-08 DIAGNOSIS — R079 Chest pain, unspecified: Secondary | ICD-10-CM | POA: Diagnosis not present

## 2013-06-08 DIAGNOSIS — R0602 Shortness of breath: Secondary | ICD-10-CM | POA: Diagnosis not present

## 2013-06-14 ENCOUNTER — Encounter (HOSPITAL_COMMUNITY)
Admission: RE | Admit: 2013-06-14 | Discharge: 2013-06-14 | Disposition: A | Discharge status: Home / Self Care | Payer: Medicare Other | Source: Ambulatory Visit | Attending: Nephrology | Admitting: Nephrology

## 2013-06-14 VITALS — BP 124/72 | HR 53 | Temp 98.2°F | Resp 20

## 2013-06-14 DIAGNOSIS — N289 Disorder of kidney and ureter, unspecified: Secondary | ICD-10-CM | POA: Insufficient documentation

## 2013-06-14 DIAGNOSIS — R079 Chest pain, unspecified: Secondary | ICD-10-CM | POA: Diagnosis not present

## 2013-06-14 DIAGNOSIS — R0602 Shortness of breath: Secondary | ICD-10-CM | POA: Diagnosis not present

## 2013-06-14 LAB — IRON AND TIBC
Iron: 53 ug/dL (ref 42–135)
Saturation Ratios: 19 % — ABNORMAL LOW (ref 20–55)
TIBC: 285 ug/dL (ref 215–435)
UIBC: 232 ug/dL (ref 125–400)

## 2013-06-14 LAB — FERRITIN: Ferritin: 894 ng/mL — ABNORMAL HIGH (ref 22–322)

## 2013-06-14 MED ORDER — EPOETIN ALFA 20000 UNIT/ML IJ SOLN
20000.0000 [IU] | INTRAMUSCULAR | Status: DC
  Administered 2013-06-14: 20000 [IU] via SUBCUTANEOUS

## 2013-06-14 MED ORDER — EPOETIN ALFA 20000 UNIT/ML IJ SOLN
INTRAMUSCULAR | Status: AC
  Filled 2013-06-14: qty 1

## 2013-06-20 DIAGNOSIS — R1900 Intra-abdominal and pelvic swelling, mass and lump, unspecified site: Secondary | ICD-10-CM | POA: Diagnosis not present

## 2013-06-20 DIAGNOSIS — R0789 Other chest pain: Secondary | ICD-10-CM | POA: Diagnosis not present

## 2013-06-20 DIAGNOSIS — R9431 Abnormal electrocardiogram [ECG] [EKG]: Secondary | ICD-10-CM | POA: Diagnosis not present

## 2013-06-20 DIAGNOSIS — I10 Essential (primary) hypertension: Secondary | ICD-10-CM | POA: Diagnosis not present

## 2013-07-11 ENCOUNTER — Other Ambulatory Visit (HOSPITAL_COMMUNITY): Payer: Self-pay | Admitting: *Deleted

## 2013-07-12 ENCOUNTER — Encounter (HOSPITAL_COMMUNITY)
Admission: RE | Admit: 2013-07-12 | Discharge: 2013-07-12 | Disposition: A | Discharge status: Home / Self Care | Payer: Medicare Other | Source: Ambulatory Visit | Attending: Nephrology | Admitting: Nephrology

## 2013-07-12 VITALS — BP 122/68 | HR 66 | Temp 97.3°F | Resp 20 | Ht 71.75 in | Wt 198.0 lb

## 2013-07-12 DIAGNOSIS — N289 Disorder of kidney and ureter, unspecified: Secondary | ICD-10-CM | POA: Diagnosis not present

## 2013-07-12 LAB — POCT HEMOGLOBIN-HEMACUE: Hemoglobin: 10.6 g/dL — ABNORMAL LOW (ref 13.0–17.0)

## 2013-07-12 LAB — IRON AND TIBC
Iron: 84 ug/dL (ref 42–135)
Saturation Ratios: 29 % (ref 20–55)
UIBC: 207 ug/dL (ref 125–400)

## 2013-07-12 LAB — FERRITIN: Ferritin: 744 ng/mL — ABNORMAL HIGH (ref 22–322)

## 2013-07-12 MED ORDER — EPOETIN ALFA 20000 UNIT/ML IJ SOLN
20000.0000 [IU] | INTRAMUSCULAR | Status: DC
  Administered 2013-07-12: 20000 [IU] via SUBCUTANEOUS

## 2013-07-12 MED ORDER — SODIUM CHLORIDE 0.9 % IV SOLN
Freq: Once | INTRAVENOUS | Status: AC
  Administered 2013-07-12: 11:00:00 via INTRAVENOUS

## 2013-07-12 MED ORDER — FERUMOXYTOL INJECTION 510 MG/17 ML
INTRAVENOUS | Status: AC
  Administered 2013-07-12: 11:00:00 510 mg via INTRAVENOUS
  Filled 2013-07-12: qty 17

## 2013-07-12 MED ORDER — FERUMOXYTOL INJECTION 510 MG/17 ML
510.0000 mg | Freq: Once | INTRAVENOUS | Status: AC
  Administered 2013-07-12: 510 mg via INTRAVENOUS

## 2013-07-12 MED ORDER — EPOETIN ALFA 20000 UNIT/ML IJ SOLN
INTRAMUSCULAR | Status: AC
  Administered 2013-07-12: 11:00:00 20000 [IU] via SUBCUTANEOUS
  Filled 2013-07-12: qty 1

## 2013-08-08 ENCOUNTER — Other Ambulatory Visit (HOSPITAL_COMMUNITY): Payer: Self-pay | Admitting: *Deleted

## 2013-08-09 ENCOUNTER — Encounter (HOSPITAL_COMMUNITY)
Admission: RE | Admit: 2013-08-09 | Discharge: 2013-08-09 | Disposition: A | Discharge status: Home / Self Care | Payer: Medicare Other | Source: Ambulatory Visit | Attending: Nephrology | Admitting: Nephrology

## 2013-08-09 VITALS — BP 132/59 | HR 83 | Temp 98.0°F | Resp 18

## 2013-08-09 DIAGNOSIS — N289 Disorder of kidney and ureter, unspecified: Secondary | ICD-10-CM | POA: Insufficient documentation

## 2013-08-09 LAB — IRON AND TIBC
Saturation Ratios: 29 % (ref 20–55)
TIBC: 281 ug/dL (ref 215–435)

## 2013-08-09 LAB — POCT HEMOGLOBIN-HEMACUE: Hemoglobin: 9.9 g/dL — ABNORMAL LOW (ref 13.0–17.0)

## 2013-08-09 MED ORDER — EPOETIN ALFA 20000 UNIT/ML IJ SOLN
20000.0000 [IU] | INTRAMUSCULAR | Status: DC
  Administered 2013-08-09: 11:00:00 20000 [IU] via SUBCUTANEOUS

## 2013-08-09 MED ORDER — EPOETIN ALFA 20000 UNIT/ML IJ SOLN
INTRAMUSCULAR | Status: AC
  Filled 2013-08-09: qty 1

## 2013-09-05 ENCOUNTER — Other Ambulatory Visit (HOSPITAL_COMMUNITY): Payer: Self-pay | Admitting: *Deleted

## 2013-09-06 ENCOUNTER — Encounter (HOSPITAL_COMMUNITY)
Admission: RE | Admit: 2013-09-06 | Discharge: 2013-09-06 | Disposition: A | Discharge status: Home / Self Care | Payer: Medicare Other | Source: Ambulatory Visit | Attending: Nephrology | Admitting: Nephrology

## 2013-09-06 VITALS — BP 127/82 | HR 70 | Temp 97.7°F | Resp 20

## 2013-09-06 DIAGNOSIS — N289 Disorder of kidney and ureter, unspecified: Secondary | ICD-10-CM | POA: Insufficient documentation

## 2013-09-06 LAB — POCT HEMOGLOBIN-HEMACUE: HEMOGLOBIN: 11.1 g/dL — AB (ref 13.0–17.0)

## 2013-09-06 LAB — IRON AND TIBC
Iron: 87 ug/dL (ref 42–135)
Saturation Ratios: 26 % (ref 20–55)
TIBC: 331 ug/dL (ref 215–435)
UIBC: 244 ug/dL (ref 125–400)

## 2013-09-06 LAB — FERRITIN: FERRITIN: 1255 ng/mL — AB (ref 22–322)

## 2013-09-06 MED ORDER — EPOETIN ALFA 20000 UNIT/ML IJ SOLN
INTRAMUSCULAR | Status: AC
  Administered 2013-09-06: 11:00:00 20000 [IU] via SUBCUTANEOUS
  Filled 2013-09-06: qty 1

## 2013-09-06 MED ORDER — EPOETIN ALFA 20000 UNIT/ML IJ SOLN
20000.0000 [IU] | INTRAMUSCULAR | Status: DC
  Administered 2013-09-06: 20000 [IU] via SUBCUTANEOUS

## 2013-09-25 DIAGNOSIS — IMO0001 Reserved for inherently not codable concepts without codable children: Secondary | ICD-10-CM | POA: Diagnosis not present

## 2013-09-25 DIAGNOSIS — I129 Hypertensive chronic kidney disease with stage 1 through stage 4 chronic kidney disease, or unspecified chronic kidney disease: Secondary | ICD-10-CM | POA: Diagnosis not present

## 2013-09-25 DIAGNOSIS — N058 Unspecified nephritic syndrome with other morphologic changes: Secondary | ICD-10-CM | POA: Diagnosis not present

## 2013-09-25 DIAGNOSIS — N183 Chronic kidney disease, stage 3 unspecified: Secondary | ICD-10-CM | POA: Diagnosis not present

## 2013-09-25 DIAGNOSIS — I10 Essential (primary) hypertension: Secondary | ICD-10-CM | POA: Diagnosis not present

## 2013-09-25 DIAGNOSIS — E1129 Type 2 diabetes mellitus with other diabetic kidney complication: Secondary | ICD-10-CM | POA: Diagnosis not present

## 2013-09-27 ENCOUNTER — Encounter (HOSPITAL_COMMUNITY)
Admission: RE | Admit: 2013-09-27 | Discharge: 2013-09-27 | Disposition: A | Discharge status: Home / Self Care | Payer: Medicare Other | Source: Ambulatory Visit | Attending: Nephrology | Admitting: Nephrology

## 2013-09-27 VITALS — BP 127/64 | HR 87 | Temp 98.2°F | Resp 20

## 2013-09-27 DIAGNOSIS — D649 Anemia, unspecified: Secondary | ICD-10-CM | POA: Diagnosis not present

## 2013-09-27 DIAGNOSIS — N289 Disorder of kidney and ureter, unspecified: Secondary | ICD-10-CM | POA: Diagnosis not present

## 2013-09-27 MED ORDER — EPOETIN ALFA 20000 UNIT/ML IJ SOLN
INTRAMUSCULAR | Status: AC
  Filled 2013-09-27: qty 1

## 2013-09-27 MED ORDER — EPOETIN ALFA 20000 UNIT/ML IJ SOLN
20000.0000 [IU] | INTRAMUSCULAR | Status: DC
  Administered 2013-09-27: 10:00:00 20000 [IU] via SUBCUTANEOUS

## 2013-09-28 LAB — POCT HEMOGLOBIN-HEMACUE: Hemoglobin: 10.3 g/dL — ABNORMAL LOW (ref 13.0–17.0)

## 2013-10-13 DIAGNOSIS — D631 Anemia in chronic kidney disease: Secondary | ICD-10-CM | POA: Diagnosis not present

## 2013-10-13 DIAGNOSIS — I129 Hypertensive chronic kidney disease with stage 1 through stage 4 chronic kidney disease, or unspecified chronic kidney disease: Secondary | ICD-10-CM | POA: Diagnosis not present

## 2013-10-13 DIAGNOSIS — M109 Gout, unspecified: Secondary | ICD-10-CM | POA: Diagnosis not present

## 2013-10-13 DIAGNOSIS — N2581 Secondary hyperparathyroidism of renal origin: Secondary | ICD-10-CM | POA: Diagnosis not present

## 2013-10-13 DIAGNOSIS — N183 Chronic kidney disease, stage 3 unspecified: Secondary | ICD-10-CM | POA: Diagnosis not present

## 2013-10-18 ENCOUNTER — Encounter (HOSPITAL_COMMUNITY)
Admission: RE | Admit: 2013-10-18 | Discharge: 2013-10-18 | Disposition: A | Discharge status: Home / Self Care | Payer: Medicare Other | Source: Ambulatory Visit | Attending: Nephrology | Admitting: Nephrology

## 2013-10-18 VITALS — BP 131/76 | HR 76 | Temp 97.8°F | Resp 20

## 2013-10-18 DIAGNOSIS — N289 Disorder of kidney and ureter, unspecified: Secondary | ICD-10-CM

## 2013-10-18 LAB — FERRITIN: Ferritin: 829 ng/mL — ABNORMAL HIGH (ref 22–322)

## 2013-10-18 LAB — IRON AND TIBC
Iron: 81 ug/dL (ref 42–135)
Saturation Ratios: 27 % (ref 20–55)
TIBC: 304 ug/dL (ref 215–435)
UIBC: 223 ug/dL (ref 125–400)

## 2013-10-18 LAB — POCT HEMOGLOBIN-HEMACUE: Hemoglobin: 10.7 g/dL — ABNORMAL LOW (ref 13.0–17.0)

## 2013-10-18 MED ORDER — EPOETIN ALFA 20000 UNIT/ML IJ SOLN
INTRAMUSCULAR | Status: AC
  Filled 2013-10-18: qty 1

## 2013-10-18 MED ORDER — EPOETIN ALFA 20000 UNIT/ML IJ SOLN
20000.0000 [IU] | INTRAMUSCULAR | Status: DC
  Administered 2013-10-18: 11:00:00 20000 [IU] via SUBCUTANEOUS

## 2013-11-08 ENCOUNTER — Encounter (HOSPITAL_COMMUNITY)
Admission: RE | Admit: 2013-11-08 | Discharge: 2013-11-08 | Disposition: A | Discharge status: Home / Self Care | Payer: Medicare Other | Source: Ambulatory Visit | Attending: Nephrology | Admitting: Nephrology

## 2013-11-08 VITALS — BP 149/100 | HR 86 | Temp 98.1°F | Resp 20

## 2013-11-08 DIAGNOSIS — N289 Disorder of kidney and ureter, unspecified: Secondary | ICD-10-CM | POA: Diagnosis not present

## 2013-11-08 LAB — POCT HEMOGLOBIN-HEMACUE: HEMOGLOBIN: 12.5 g/dL — AB (ref 13.0–17.0)

## 2013-11-08 MED ORDER — EPOETIN ALFA 20000 UNIT/ML IJ SOLN: 20000.0000 [IU] | INTRAMUSCULAR | Status: DC

## 2013-11-22 ENCOUNTER — Encounter (HOSPITAL_COMMUNITY)
Admission: RE | Admit: 2013-11-22 | Discharge: 2013-11-22 | Disposition: A | Discharge status: Home / Self Care | Payer: Medicare Other | Source: Ambulatory Visit | Attending: Nephrology | Admitting: Nephrology

## 2013-11-22 VITALS — BP 113/60 | HR 86 | Temp 98.3°F | Resp 20

## 2013-11-22 DIAGNOSIS — N289 Disorder of kidney and ureter, unspecified: Secondary | ICD-10-CM | POA: Insufficient documentation

## 2013-11-22 LAB — IRON AND TIBC
IRON: 70 ug/dL (ref 42–135)
Saturation Ratios: 25 % (ref 20–55)
TIBC: 282 ug/dL (ref 215–435)
UIBC: 212 ug/dL (ref 125–400)

## 2013-11-22 LAB — POCT HEMOGLOBIN-HEMACUE: Hemoglobin: 10.2 g/dL — ABNORMAL LOW (ref 13.0–17.0)

## 2013-11-22 LAB — FERRITIN: Ferritin: 752 ng/mL — ABNORMAL HIGH (ref 22–322)

## 2013-11-22 MED ORDER — EPOETIN ALFA 20000 UNIT/ML IJ SOLN
20000.0000 [IU] | INTRAMUSCULAR | Status: DC
  Administered 2013-11-22: 20000 [IU] via SUBCUTANEOUS

## 2013-11-22 MED ORDER — EPOETIN ALFA 20000 UNIT/ML IJ SOLN
INTRAMUSCULAR | Status: AC
  Filled 2013-11-22: qty 1

## 2013-12-13 ENCOUNTER — Encounter (HOSPITAL_COMMUNITY)
Admission: RE | Admit: 2013-12-13 | Discharge: 2013-12-13 | Disposition: A | Discharge status: Home / Self Care | Payer: Medicare Other | Source: Ambulatory Visit | Attending: Nephrology | Admitting: Nephrology

## 2013-12-13 VITALS — BP 128/77 | HR 92 | Resp 14

## 2013-12-13 DIAGNOSIS — N289 Disorder of kidney and ureter, unspecified: Secondary | ICD-10-CM

## 2013-12-13 LAB — POCT HEMOGLOBIN-HEMACUE: Hemoglobin: 11 g/dL — ABNORMAL LOW (ref 13.0–17.0)

## 2013-12-13 MED ORDER — EPOETIN ALFA 20000 UNIT/ML IJ SOLN
20000.0000 [IU] | INTRAMUSCULAR | Status: DC
  Administered 2013-12-13: 20000 [IU] via SUBCUTANEOUS

## 2013-12-13 MED ORDER — EPOETIN ALFA 20000 UNIT/ML IJ SOLN
INTRAMUSCULAR | Status: AC
  Filled 2013-12-13: qty 1

## 2013-12-22 DIAGNOSIS — H35039 Hypertensive retinopathy, unspecified eye: Secondary | ICD-10-CM | POA: Diagnosis not present

## 2013-12-22 DIAGNOSIS — H25049 Posterior subcapsular polar age-related cataract, unspecified eye: Secondary | ICD-10-CM | POA: Diagnosis not present

## 2013-12-22 DIAGNOSIS — H353190 Nonexudative age-related macular degeneration, unspecified eye, stage unspecified: Secondary | ICD-10-CM | POA: Diagnosis not present

## 2013-12-22 DIAGNOSIS — H25019 Cortical age-related cataract, unspecified eye: Secondary | ICD-10-CM | POA: Diagnosis not present

## 2013-12-22 DIAGNOSIS — H2510 Age-related nuclear cataract, unspecified eye: Secondary | ICD-10-CM | POA: Diagnosis not present

## 2013-12-25 DIAGNOSIS — N058 Unspecified nephritic syndrome with other morphologic changes: Secondary | ICD-10-CM | POA: Diagnosis not present

## 2013-12-25 DIAGNOSIS — E1129 Type 2 diabetes mellitus with other diabetic kidney complication: Secondary | ICD-10-CM | POA: Diagnosis not present

## 2013-12-25 DIAGNOSIS — I129 Hypertensive chronic kidney disease with stage 1 through stage 4 chronic kidney disease, or unspecified chronic kidney disease: Secondary | ICD-10-CM | POA: Diagnosis not present

## 2013-12-25 DIAGNOSIS — N183 Chronic kidney disease, stage 3 unspecified: Secondary | ICD-10-CM | POA: Diagnosis not present

## 2014-01-03 ENCOUNTER — Encounter (HOSPITAL_COMMUNITY)
Admission: RE | Admit: 2014-01-03 | Discharge: 2014-01-03 | Disposition: A | Discharge status: Home / Self Care | Payer: Medicare Other | Source: Ambulatory Visit | Attending: Nephrology | Admitting: Nephrology

## 2014-01-03 VITALS — BP 135/83 | HR 79 | Temp 98.5°F | Resp 20

## 2014-01-03 DIAGNOSIS — N289 Disorder of kidney and ureter, unspecified: Secondary | ICD-10-CM | POA: Diagnosis not present

## 2014-01-03 LAB — IRON AND TIBC
IRON: 72 ug/dL (ref 42–135)
Saturation Ratios: 24 % (ref 20–55)
TIBC: 303 ug/dL (ref 215–435)
UIBC: 231 ug/dL (ref 125–400)

## 2014-01-03 LAB — POCT HEMOGLOBIN-HEMACUE: Hemoglobin: 10.2 g/dL — ABNORMAL LOW (ref 13.0–17.0)

## 2014-01-03 LAB — FERRITIN: Ferritin: 671 ng/mL — ABNORMAL HIGH (ref 22–322)

## 2014-01-03 MED ORDER — EPOETIN ALFA 20000 UNIT/ML IJ SOLN
INTRAMUSCULAR | Status: AC
  Filled 2014-01-03: qty 1

## 2014-01-03 MED ORDER — EPOETIN ALFA 20000 UNIT/ML IJ SOLN
20000.0000 [IU] | INTRAMUSCULAR | Status: DC
Start: 2014-01-03 — End: 2014-01-04
  Administered 2014-01-03: 20000 [IU] via SUBCUTANEOUS

## 2014-01-09 DIAGNOSIS — H5700 Unspecified anomaly of pupillary function: Secondary | ICD-10-CM | POA: Diagnosis not present

## 2014-01-09 DIAGNOSIS — H269 Unspecified cataract: Secondary | ICD-10-CM | POA: Diagnosis not present

## 2014-01-09 DIAGNOSIS — H2510 Age-related nuclear cataract, unspecified eye: Secondary | ICD-10-CM | POA: Diagnosis not present

## 2014-01-24 ENCOUNTER — Encounter (HOSPITAL_COMMUNITY)
Admission: RE | Admit: 2014-01-24 | Discharge: 2014-01-24 | Disposition: A | Discharge status: Home / Self Care | Payer: Medicare Other | Source: Ambulatory Visit | Attending: Nephrology | Admitting: Nephrology

## 2014-01-24 VITALS — BP 125/76 | HR 84 | Temp 98.4°F | Resp 20

## 2014-01-24 DIAGNOSIS — N289 Disorder of kidney and ureter, unspecified: Secondary | ICD-10-CM | POA: Diagnosis not present

## 2014-01-24 LAB — POCT HEMOGLOBIN-HEMACUE: Hemoglobin: 10.8 g/dL — ABNORMAL LOW (ref 13.0–17.0)

## 2014-01-24 MED ORDER — EPOETIN ALFA 20000 UNIT/ML IJ SOLN
20000.0000 [IU] | INTRAMUSCULAR | Status: DC
  Administered 2014-01-24: 20000 [IU] via SUBCUTANEOUS

## 2014-01-24 MED ORDER — EPOETIN ALFA 20000 UNIT/ML IJ SOLN
INTRAMUSCULAR | Status: AC
  Filled 2014-01-24: qty 1

## 2014-02-13 DIAGNOSIS — N139 Obstructive and reflux uropathy, unspecified: Secondary | ICD-10-CM | POA: Diagnosis not present

## 2014-02-13 DIAGNOSIS — N401 Enlarged prostate with lower urinary tract symptoms: Secondary | ICD-10-CM | POA: Diagnosis not present

## 2014-02-13 DIAGNOSIS — N138 Other obstructive and reflux uropathy: Secondary | ICD-10-CM | POA: Diagnosis not present

## 2014-02-14 ENCOUNTER — Encounter (HOSPITAL_COMMUNITY)
Admission: RE | Admit: 2014-02-14 | Discharge: 2014-02-14 | Disposition: A | Discharge status: Home / Self Care | Payer: Medicare Other | Source: Ambulatory Visit | Attending: Nephrology | Admitting: Nephrology

## 2014-02-14 VITALS — BP 133/90 | HR 90 | Temp 98.2°F | Resp 20

## 2014-02-14 DIAGNOSIS — N289 Disorder of kidney and ureter, unspecified: Secondary | ICD-10-CM

## 2014-02-14 LAB — POCT HEMOGLOBIN-HEMACUE: Hemoglobin: 10.8 g/dL — ABNORMAL LOW (ref 13.0–17.0)

## 2014-02-14 LAB — IRON AND TIBC
IRON: 79 ug/dL (ref 42–135)
SATURATION RATIOS: 26 % (ref 20–55)
TIBC: 304 ug/dL (ref 215–435)
UIBC: 225 ug/dL (ref 125–400)

## 2014-02-14 LAB — FERRITIN: FERRITIN: 751 ng/mL — AB (ref 22–322)

## 2014-02-14 MED ORDER — EPOETIN ALFA 20000 UNIT/ML IJ SOLN: 20000.0000 [IU] | INTRAMUSCULAR | Status: DC

## 2014-02-14 MED ORDER — EPOETIN ALFA 20000 UNIT/ML IJ SOLN
INTRAMUSCULAR | Status: AC
Start: 2014-02-14 — End: 2014-02-14
  Administered 2014-02-14: 20000 [IU] via SUBCUTANEOUS
  Filled 2014-02-14: qty 1

## 2014-02-16 DIAGNOSIS — H25019 Cortical age-related cataract, unspecified eye: Secondary | ICD-10-CM | POA: Diagnosis not present

## 2014-02-16 DIAGNOSIS — H25049 Posterior subcapsular polar age-related cataract, unspecified eye: Secondary | ICD-10-CM | POA: Diagnosis not present

## 2014-02-16 DIAGNOSIS — H2510 Age-related nuclear cataract, unspecified eye: Secondary | ICD-10-CM | POA: Diagnosis not present

## 2014-02-20 DIAGNOSIS — N401 Enlarged prostate with lower urinary tract symptoms: Secondary | ICD-10-CM | POA: Diagnosis not present

## 2014-02-20 DIAGNOSIS — N139 Obstructive and reflux uropathy, unspecified: Secondary | ICD-10-CM | POA: Diagnosis not present

## 2014-02-20 DIAGNOSIS — R972 Elevated prostate specific antigen [PSA]: Secondary | ICD-10-CM | POA: Diagnosis not present

## 2014-02-26 DIAGNOSIS — N039 Chronic nephritic syndrome with unspecified morphologic changes: Secondary | ICD-10-CM | POA: Diagnosis not present

## 2014-02-26 DIAGNOSIS — N183 Chronic kidney disease, stage 3 unspecified: Secondary | ICD-10-CM | POA: Diagnosis not present

## 2014-02-26 DIAGNOSIS — D631 Anemia in chronic kidney disease: Secondary | ICD-10-CM | POA: Diagnosis not present

## 2014-02-26 DIAGNOSIS — E1129 Type 2 diabetes mellitus with other diabetic kidney complication: Secondary | ICD-10-CM | POA: Diagnosis not present

## 2014-02-26 DIAGNOSIS — N2581 Secondary hyperparathyroidism of renal origin: Secondary | ICD-10-CM | POA: Diagnosis not present

## 2014-02-26 DIAGNOSIS — I129 Hypertensive chronic kidney disease with stage 1 through stage 4 chronic kidney disease, or unspecified chronic kidney disease: Secondary | ICD-10-CM | POA: Diagnosis not present

## 2014-03-07 ENCOUNTER — Inpatient Hospital Stay (HOSPITAL_COMMUNITY): Admission: RE | Admit: 2014-03-07 | Payer: Medicare Other | Source: Ambulatory Visit

## 2014-03-12 ENCOUNTER — Encounter (HOSPITAL_COMMUNITY)
Admission: RE | Admit: 2014-03-12 | Discharge: 2014-03-12 | Disposition: A | Discharge status: Home / Self Care | Payer: Medicare Other | Source: Ambulatory Visit | Attending: Nephrology | Admitting: Nephrology

## 2014-03-12 VITALS — BP 137/91 | HR 64 | Temp 98.1°F | Resp 20

## 2014-03-12 DIAGNOSIS — N289 Disorder of kidney and ureter, unspecified: Secondary | ICD-10-CM | POA: Insufficient documentation

## 2014-03-12 LAB — IRON AND TIBC
Iron: 71 ug/dL (ref 42–135)
Saturation Ratios: 23 % (ref 20–55)
TIBC: 315 ug/dL (ref 215–435)
UIBC: 244 ug/dL (ref 125–400)

## 2014-03-12 LAB — FERRITIN: Ferritin: 778 ng/mL — ABNORMAL HIGH (ref 22–322)

## 2014-03-12 LAB — POCT HEMOGLOBIN-HEMACUE: HEMOGLOBIN: 11 g/dL — AB (ref 13.0–17.0)

## 2014-03-12 MED ORDER — EPOETIN ALFA 20000 UNIT/ML IJ SOLN
INTRAMUSCULAR | Status: AC
  Filled 2014-03-12: qty 1

## 2014-03-12 MED ORDER — EPOETIN ALFA 20000 UNIT/ML IJ SOLN
20000.0000 [IU] | INTRAMUSCULAR | Status: DC
  Administered 2014-03-12: 20000 [IU] via SUBCUTANEOUS

## 2014-03-13 DIAGNOSIS — H5700 Unspecified anomaly of pupillary function: Secondary | ICD-10-CM | POA: Diagnosis not present

## 2014-03-13 DIAGNOSIS — H2510 Age-related nuclear cataract, unspecified eye: Secondary | ICD-10-CM | POA: Diagnosis not present

## 2014-04-02 ENCOUNTER — Encounter (HOSPITAL_COMMUNITY)
Admission: RE | Admit: 2014-04-02 | Discharge: 2014-04-02 | Disposition: A | Discharge status: Home / Self Care | Payer: Medicare Other | Source: Ambulatory Visit | Attending: Nephrology | Admitting: Nephrology

## 2014-04-02 VITALS — BP 138/80 | HR 85 | Temp 99.6°F | Resp 20

## 2014-04-02 DIAGNOSIS — N289 Disorder of kidney and ureter, unspecified: Secondary | ICD-10-CM | POA: Insufficient documentation

## 2014-04-02 LAB — POCT HEMOGLOBIN-HEMACUE: Hemoglobin: 11.5 g/dL — ABNORMAL LOW (ref 13.0–17.0)

## 2014-04-02 MED ORDER — EPOETIN ALFA 20000 UNIT/ML IJ SOLN
20000.0000 [IU] | INTRAMUSCULAR | Status: DC
  Administered 2014-04-02: 20000 [IU] via SUBCUTANEOUS

## 2014-04-02 MED ORDER — EPOETIN ALFA 20000 UNIT/ML IJ SOLN
INTRAMUSCULAR | Status: AC
  Administered 2014-04-02: 20000 [IU] via SUBCUTANEOUS
  Filled 2014-04-02: qty 1

## 2014-04-23 ENCOUNTER — Encounter (HOSPITAL_COMMUNITY): Payer: Medicare Other

## 2014-04-27 ENCOUNTER — Other Ambulatory Visit (HOSPITAL_COMMUNITY): Payer: Self-pay

## 2014-05-01 ENCOUNTER — Encounter (HOSPITAL_COMMUNITY)
Admission: RE | Admit: 2014-05-01 | Discharge: 2014-05-01 | Disposition: A | Discharge status: Home / Self Care | Payer: Medicare Other | Source: Ambulatory Visit | Attending: Nephrology | Admitting: Nephrology

## 2014-05-01 VITALS — BP 131/79 | HR 81 | Temp 98.1°F | Resp 20

## 2014-05-01 DIAGNOSIS — N183 Chronic kidney disease, stage 3 unspecified: Secondary | ICD-10-CM | POA: Diagnosis not present

## 2014-05-01 DIAGNOSIS — N289 Disorder of kidney and ureter, unspecified: Secondary | ICD-10-CM | POA: Diagnosis present

## 2014-05-01 DIAGNOSIS — D638 Anemia in other chronic diseases classified elsewhere: Secondary | ICD-10-CM | POA: Diagnosis not present

## 2014-05-01 LAB — FERRITIN: FERRITIN: 746 ng/mL — AB (ref 22–322)

## 2014-05-01 LAB — IRON AND TIBC
Iron: 74 ug/dL (ref 42–135)
SATURATION RATIOS: 24 % (ref 20–55)
TIBC: 312 ug/dL (ref 215–435)
UIBC: 238 ug/dL (ref 125–400)

## 2014-05-01 LAB — POCT HEMOGLOBIN-HEMACUE: Hemoglobin: 10.6 g/dL — ABNORMAL LOW (ref 13.0–17.0)

## 2014-05-01 MED ORDER — EPOETIN ALFA 20000 UNIT/ML IJ SOLN
20000.0000 [IU] | INTRAMUSCULAR | Status: DC
  Administered 2014-05-01: 20000 [IU] via SUBCUTANEOUS

## 2014-05-01 MED ORDER — EPOETIN ALFA 20000 UNIT/ML IJ SOLN
INTRAMUSCULAR | Status: AC
  Filled 2014-05-01: qty 1

## 2014-05-14 DIAGNOSIS — R059 Cough, unspecified: Secondary | ICD-10-CM | POA: Diagnosis not present

## 2014-05-14 DIAGNOSIS — R05 Cough: Secondary | ICD-10-CM | POA: Diagnosis not present

## 2014-05-14 DIAGNOSIS — Z23 Encounter for immunization: Secondary | ICD-10-CM | POA: Diagnosis not present

## 2014-05-14 DIAGNOSIS — F17200 Nicotine dependence, unspecified, uncomplicated: Secondary | ICD-10-CM | POA: Diagnosis not present

## 2014-05-24 ENCOUNTER — Encounter (HOSPITAL_COMMUNITY): Payer: Medicare Other

## 2014-06-01 ENCOUNTER — Encounter (HOSPITAL_COMMUNITY)
Admission: RE | Admit: 2014-06-01 | Discharge: 2014-06-01 | Disposition: A | Discharge status: Home / Self Care | Payer: Medicare Other | Source: Ambulatory Visit | Attending: Nephrology | Admitting: Nephrology

## 2014-06-01 VITALS — BP 122/76 | HR 84 | Temp 98.5°F

## 2014-06-01 DIAGNOSIS — N289 Disorder of kidney and ureter, unspecified: Secondary | ICD-10-CM | POA: Diagnosis not present

## 2014-06-01 LAB — POCT HEMOGLOBIN-HEMACUE: Hemoglobin: 10.4 g/dL — ABNORMAL LOW (ref 13.0–17.0)

## 2014-06-01 MED ORDER — EPOETIN ALFA 20000 UNIT/ML IJ SOLN
INTRAMUSCULAR | Status: AC
  Filled 2014-06-01: qty 1

## 2014-06-01 MED ORDER — EPOETIN ALFA 20000 UNIT/ML IJ SOLN
20000.0000 [IU] | INTRAMUSCULAR | Status: DC
  Administered 2014-06-01: 20000 [IU] via SUBCUTANEOUS

## 2014-06-02 LAB — IRON AND TIBC
Iron: 64 ug/dL (ref 42–135)
Saturation Ratios: 21 % (ref 20–55)
TIBC: 302 ug/dL (ref 215–435)
UIBC: 238 ug/dL (ref 125–400)

## 2014-06-02 LAB — FERRITIN: Ferritin: 798 ng/mL — ABNORMAL HIGH (ref 22–322)

## 2014-06-06 DIAGNOSIS — M5441 Lumbago with sciatica, right side: Secondary | ICD-10-CM | POA: Diagnosis not present

## 2014-06-06 DIAGNOSIS — N08 Glomerular disorders in diseases classified elsewhere: Secondary | ICD-10-CM | POA: Diagnosis not present

## 2014-06-06 DIAGNOSIS — N183 Chronic kidney disease, stage 3 (moderate): Secondary | ICD-10-CM | POA: Diagnosis not present

## 2014-06-06 DIAGNOSIS — F17200 Nicotine dependence, unspecified, uncomplicated: Secondary | ICD-10-CM | POA: Diagnosis not present

## 2014-06-06 DIAGNOSIS — I129 Hypertensive chronic kidney disease with stage 1 through stage 4 chronic kidney disease, or unspecified chronic kidney disease: Secondary | ICD-10-CM | POA: Diagnosis not present

## 2014-06-06 DIAGNOSIS — Z79899 Other long term (current) drug therapy: Secondary | ICD-10-CM | POA: Diagnosis not present

## 2014-06-06 DIAGNOSIS — E1122 Type 2 diabetes mellitus with diabetic chronic kidney disease: Secondary | ICD-10-CM | POA: Diagnosis not present

## 2014-06-28 ENCOUNTER — Other Ambulatory Visit (HOSPITAL_COMMUNITY): Payer: Self-pay | Admitting: *Deleted

## 2014-06-29 ENCOUNTER — Encounter (HOSPITAL_COMMUNITY)
Admission: RE | Admit: 2014-06-29 | Discharge: 2014-06-29 | Disposition: A | Discharge status: Home / Self Care | Payer: Medicare Other | Source: Ambulatory Visit | Attending: Nephrology | Admitting: Nephrology

## 2014-06-29 DIAGNOSIS — N289 Disorder of kidney and ureter, unspecified: Secondary | ICD-10-CM | POA: Diagnosis not present

## 2014-06-29 LAB — IRON AND TIBC
IRON: 62 ug/dL (ref 42–135)
SATURATION RATIOS: 21 % (ref 20–55)
TIBC: 294 ug/dL (ref 215–435)
UIBC: 232 ug/dL (ref 125–400)

## 2014-06-29 LAB — FERRITIN: Ferritin: 795 ng/mL — ABNORMAL HIGH (ref 22–322)

## 2014-06-29 LAB — POCT HEMOGLOBIN-HEMACUE: Hemoglobin: 10.5 g/dL — ABNORMAL LOW (ref 13.0–17.0)

## 2014-06-29 MED ORDER — EPOETIN ALFA 20000 UNIT/ML IJ SOLN
INTRAMUSCULAR | Status: AC
  Filled 2014-06-29: qty 1

## 2014-06-29 MED ORDER — SODIUM CHLORIDE 0.9 % IV SOLN
510.0000 mg | Freq: Once | INTRAVENOUS | Status: AC
  Administered 2014-06-29: 510 mg via INTRAVENOUS
  Filled 2014-06-29: qty 17

## 2014-06-29 MED ORDER — EPOETIN ALFA 20000 UNIT/ML IJ SOLN
20000.0000 [IU] | INTRAMUSCULAR | Status: DC
  Administered 2014-06-29: 20000 [IU] via SUBCUTANEOUS

## 2014-07-24 DIAGNOSIS — I129 Hypertensive chronic kidney disease with stage 1 through stage 4 chronic kidney disease, or unspecified chronic kidney disease: Secondary | ICD-10-CM | POA: Diagnosis not present

## 2014-07-24 DIAGNOSIS — N183 Chronic kidney disease, stage 3 (moderate): Secondary | ICD-10-CM | POA: Diagnosis not present

## 2014-07-24 DIAGNOSIS — D649 Anemia, unspecified: Secondary | ICD-10-CM | POA: Diagnosis not present

## 2014-07-24 DIAGNOSIS — N2581 Secondary hyperparathyroidism of renal origin: Secondary | ICD-10-CM | POA: Diagnosis not present

## 2014-07-24 DIAGNOSIS — N189 Chronic kidney disease, unspecified: Secondary | ICD-10-CM | POA: Diagnosis not present

## 2014-07-24 DIAGNOSIS — D631 Anemia in chronic kidney disease: Secondary | ICD-10-CM | POA: Diagnosis not present

## 2014-07-27 ENCOUNTER — Encounter (HOSPITAL_COMMUNITY)
Admission: RE | Admit: 2014-07-27 | Discharge: 2014-07-27 | Disposition: A | Discharge status: Home / Self Care | Payer: Medicare Other | Source: Ambulatory Visit | Attending: Nephrology | Admitting: Nephrology

## 2014-07-27 DIAGNOSIS — D631 Anemia in chronic kidney disease: Secondary | ICD-10-CM | POA: Insufficient documentation

## 2014-07-27 DIAGNOSIS — N289 Disorder of kidney and ureter, unspecified: Secondary | ICD-10-CM

## 2014-07-27 DIAGNOSIS — N183 Chronic kidney disease, stage 3 (moderate): Secondary | ICD-10-CM | POA: Diagnosis not present

## 2014-07-27 LAB — POCT HEMOGLOBIN-HEMACUE: Hemoglobin: 10.9 g/dL — ABNORMAL LOW (ref 13.0–17.0)

## 2014-07-27 LAB — IRON AND TIBC
Iron: 74 ug/dL (ref 42–135)
Saturation Ratios: 24 % (ref 20–55)
TIBC: 313 ug/dL (ref 215–435)
UIBC: 239 ug/dL (ref 125–400)

## 2014-07-27 LAB — FERRITIN: FERRITIN: 1025 ng/mL — AB (ref 22–322)

## 2014-07-27 MED ORDER — EPOETIN ALFA 20000 UNIT/ML IJ SOLN
20000.0000 [IU] | INTRAMUSCULAR | Status: DC
  Administered 2014-07-27: 20000 [IU] via SUBCUTANEOUS

## 2014-08-27 ENCOUNTER — Encounter (HOSPITAL_COMMUNITY)
Admission: RE | Admit: 2014-08-27 | Discharge: 2014-08-27 | Disposition: A | Discharge status: Home / Self Care | Payer: Medicare Other | Source: Ambulatory Visit | Attending: Nephrology | Admitting: Nephrology

## 2014-08-27 DIAGNOSIS — N183 Chronic kidney disease, stage 3 (moderate): Secondary | ICD-10-CM | POA: Insufficient documentation

## 2014-08-27 DIAGNOSIS — D631 Anemia in chronic kidney disease: Secondary | ICD-10-CM | POA: Diagnosis not present

## 2014-08-27 DIAGNOSIS — N289 Disorder of kidney and ureter, unspecified: Secondary | ICD-10-CM

## 2014-08-27 LAB — FERRITIN: Ferritin: 874 ng/mL — ABNORMAL HIGH (ref 22–322)

## 2014-08-27 LAB — IRON AND TIBC
Iron: 83 ug/dL (ref 42–165)
Saturation Ratios: 27 % (ref 20–55)
TIBC: 307 ug/dL (ref 215–435)
UIBC: 224 ug/dL (ref 125–400)

## 2014-08-27 LAB — POCT HEMOGLOBIN-HEMACUE: Hemoglobin: 10.8 g/dL — ABNORMAL LOW (ref 13.0–17.0)

## 2014-08-27 MED ORDER — EPOETIN ALFA 20000 UNIT/ML IJ SOLN
INTRAMUSCULAR | Status: AC
  Filled 2014-08-27: qty 1

## 2014-08-27 MED ORDER — EPOETIN ALFA 20000 UNIT/ML IJ SOLN
20000.0000 [IU] | INTRAMUSCULAR | Status: DC
  Administered 2014-08-27: 20000 [IU] via SUBCUTANEOUS

## 2014-09-21 ENCOUNTER — Other Ambulatory Visit (HOSPITAL_COMMUNITY): Payer: Self-pay | Admitting: *Deleted

## 2014-09-24 ENCOUNTER — Encounter (HOSPITAL_COMMUNITY)
Admission: RE | Admit: 2014-09-24 | Discharge: 2014-09-24 | Disposition: A | Discharge status: Home / Self Care | Payer: Medicare Other | Source: Ambulatory Visit | Attending: Nephrology | Admitting: Nephrology

## 2014-09-24 DIAGNOSIS — D631 Anemia in chronic kidney disease: Secondary | ICD-10-CM | POA: Insufficient documentation

## 2014-09-24 DIAGNOSIS — N289 Disorder of kidney and ureter, unspecified: Secondary | ICD-10-CM

## 2014-09-24 DIAGNOSIS — N183 Chronic kidney disease, stage 3 (moderate): Secondary | ICD-10-CM | POA: Insufficient documentation

## 2014-09-24 LAB — IRON AND TIBC
Iron: 99 ug/dL (ref 42–165)
Saturation Ratios: 35 % (ref 20–55)
TIBC: 281 ug/dL (ref 215–435)
UIBC: 182 ug/dL (ref 125–400)

## 2014-09-24 LAB — POCT HEMOGLOBIN-HEMACUE: Hemoglobin: 11.1 g/dL — ABNORMAL LOW (ref 13.0–17.0)

## 2014-09-24 MED ORDER — EPOETIN ALFA 20000 UNIT/ML IJ SOLN
20000.0000 [IU] | INTRAMUSCULAR | Status: DC
  Administered 2014-09-24: 20000 [IU] via SUBCUTANEOUS

## 2014-09-24 MED ORDER — EPOETIN ALFA 20000 UNIT/ML IJ SOLN
INTRAMUSCULAR | Status: AC
  Filled 2014-09-24: qty 1

## 2014-09-24 MED ORDER — SODIUM CHLORIDE 0.9 % IV SOLN
510.0000 mg | Freq: Once | INTRAVENOUS | Status: AC
  Administered 2014-09-24: 510 mg via INTRAVENOUS
  Filled 2014-09-24: qty 17

## 2014-09-25 LAB — FERRITIN: Ferritin: 785 ng/mL — ABNORMAL HIGH (ref 22–322)

## 2014-10-10 DIAGNOSIS — F17210 Nicotine dependence, cigarettes, uncomplicated: Secondary | ICD-10-CM | POA: Diagnosis not present

## 2014-10-10 DIAGNOSIS — E782 Mixed hyperlipidemia: Secondary | ICD-10-CM | POA: Diagnosis not present

## 2014-10-10 DIAGNOSIS — Z79899 Other long term (current) drug therapy: Secondary | ICD-10-CM | POA: Diagnosis not present

## 2014-10-10 DIAGNOSIS — R609 Edema, unspecified: Secondary | ICD-10-CM | POA: Diagnosis not present

## 2014-10-10 DIAGNOSIS — N183 Chronic kidney disease, stage 3 (moderate): Secondary | ICD-10-CM | POA: Diagnosis not present

## 2014-10-10 DIAGNOSIS — F17200 Nicotine dependence, unspecified, uncomplicated: Secondary | ICD-10-CM | POA: Diagnosis not present

## 2014-10-10 DIAGNOSIS — E1122 Type 2 diabetes mellitus with diabetic chronic kidney disease: Secondary | ICD-10-CM | POA: Diagnosis not present

## 2014-10-10 DIAGNOSIS — E119 Type 2 diabetes mellitus without complications: Secondary | ICD-10-CM | POA: Diagnosis not present

## 2014-10-10 DIAGNOSIS — N08 Glomerular disorders in diseases classified elsewhere: Secondary | ICD-10-CM | POA: Diagnosis not present

## 2014-10-10 DIAGNOSIS — M5441 Lumbago with sciatica, right side: Secondary | ICD-10-CM | POA: Diagnosis not present

## 2014-10-10 DIAGNOSIS — I129 Hypertensive chronic kidney disease with stage 1 through stage 4 chronic kidney disease, or unspecified chronic kidney disease: Secondary | ICD-10-CM | POA: Diagnosis not present

## 2014-10-19 ENCOUNTER — Other Ambulatory Visit (HOSPITAL_COMMUNITY): Payer: Self-pay | Admitting: *Deleted

## 2014-10-22 ENCOUNTER — Encounter (HOSPITAL_COMMUNITY)
Admission: RE | Admit: 2014-10-22 | Discharge: 2014-10-22 | Disposition: A | Discharge status: Home / Self Care | Payer: Medicare Other | Source: Ambulatory Visit | Attending: Nephrology | Admitting: Nephrology

## 2014-10-22 DIAGNOSIS — N183 Chronic kidney disease, stage 3 (moderate): Secondary | ICD-10-CM | POA: Diagnosis not present

## 2014-10-22 DIAGNOSIS — D631 Anemia in chronic kidney disease: Secondary | ICD-10-CM | POA: Diagnosis not present

## 2014-10-22 DIAGNOSIS — N289 Disorder of kidney and ureter, unspecified: Secondary | ICD-10-CM

## 2014-10-22 LAB — IRON AND TIBC
IRON: 107 ug/dL (ref 42–165)
Saturation Ratios: 38 % (ref 20–55)
TIBC: 280 ug/dL (ref 215–435)
UIBC: 173 ug/dL (ref 125–400)

## 2014-10-22 LAB — POCT HEMOGLOBIN-HEMACUE: HEMOGLOBIN: 11.1 g/dL — AB (ref 13.0–17.0)

## 2014-10-22 LAB — FERRITIN: FERRITIN: 1414 ng/mL — AB (ref 22–322)

## 2014-10-22 MED ORDER — EPOETIN ALFA 20000 UNIT/ML IJ SOLN
20000.0000 [IU] | INTRAMUSCULAR | Status: DC
  Administered 2014-10-22: 20000 [IU] via SUBCUTANEOUS

## 2014-10-22 MED ORDER — EPOETIN ALFA 20000 UNIT/ML IJ SOLN
INTRAMUSCULAR | Status: DC
Start: 2014-10-22 — End: 2014-10-23
  Filled 2014-10-22: qty 1

## 2014-11-19 ENCOUNTER — Encounter (HOSPITAL_COMMUNITY)
Admission: RE | Admit: 2014-11-19 | Discharge: 2014-11-19 | Disposition: A | Discharge status: Home / Self Care | Payer: Medicare Other | Source: Ambulatory Visit | Attending: Nephrology | Admitting: Nephrology

## 2014-11-19 DIAGNOSIS — D631 Anemia in chronic kidney disease: Secondary | ICD-10-CM | POA: Insufficient documentation

## 2014-11-19 DIAGNOSIS — N183 Chronic kidney disease, stage 3 (moderate): Secondary | ICD-10-CM | POA: Diagnosis not present

## 2014-11-19 DIAGNOSIS — N289 Disorder of kidney and ureter, unspecified: Secondary | ICD-10-CM

## 2014-11-19 LAB — IRON AND TIBC
IRON: 96 ug/dL (ref 42–165)
Saturation Ratios: 30 % (ref 20–55)
TIBC: 318 ug/dL (ref 215–435)
UIBC: 222 ug/dL (ref 125–400)

## 2014-11-19 LAB — POCT HEMOGLOBIN-HEMACUE: Hemoglobin: 10.7 g/dL — ABNORMAL LOW (ref 13.0–17.0)

## 2014-11-19 LAB — FERRITIN: FERRITIN: 1348 ng/mL — AB (ref 22–322)

## 2014-11-19 MED ORDER — EPOETIN ALFA 20000 UNIT/ML IJ SOLN
INTRAMUSCULAR | Status: AC
  Filled 2014-11-19: qty 1

## 2014-11-19 MED ORDER — EPOETIN ALFA 20000 UNIT/ML IJ SOLN
20000.0000 [IU] | INTRAMUSCULAR | Status: DC
  Administered 2014-11-19: 20000 [IU] via SUBCUTANEOUS

## 2014-12-17 ENCOUNTER — Encounter (HOSPITAL_COMMUNITY)
Admission: RE | Admit: 2014-12-17 | Discharge: 2014-12-17 | Disposition: A | Discharge status: Home / Self Care | Payer: Medicare Other | Source: Ambulatory Visit | Attending: Nephrology | Admitting: Nephrology

## 2014-12-17 DIAGNOSIS — Z79899 Other long term (current) drug therapy: Secondary | ICD-10-CM | POA: Diagnosis not present

## 2014-12-17 DIAGNOSIS — Z5181 Encounter for therapeutic drug level monitoring: Secondary | ICD-10-CM | POA: Insufficient documentation

## 2014-12-17 DIAGNOSIS — N289 Disorder of kidney and ureter, unspecified: Secondary | ICD-10-CM

## 2014-12-17 DIAGNOSIS — D631 Anemia in chronic kidney disease: Secondary | ICD-10-CM | POA: Diagnosis not present

## 2014-12-17 DIAGNOSIS — N183 Chronic kidney disease, stage 3 (moderate): Secondary | ICD-10-CM | POA: Diagnosis not present

## 2014-12-17 LAB — POCT HEMOGLOBIN-HEMACUE: Hemoglobin: 10.6 g/dL — ABNORMAL LOW (ref 13.0–17.0)

## 2014-12-17 LAB — IRON AND TIBC
IRON: 113 ug/dL (ref 42–165)
Saturation Ratios: 42 % (ref 20–55)
TIBC: 272 ug/dL (ref 215–435)
UIBC: 159 ug/dL (ref 125–400)

## 2014-12-17 LAB — FERRITIN: FERRITIN: 1212 ng/mL — AB (ref 22–322)

## 2014-12-17 MED ORDER — EPOETIN ALFA 20000 UNIT/ML IJ SOLN
INTRAMUSCULAR | Status: AC
  Filled 2014-12-17: qty 1

## 2014-12-17 MED ORDER — EPOETIN ALFA 20000 UNIT/ML IJ SOLN
20000.0000 [IU] | INTRAMUSCULAR | Status: DC
  Administered 2014-12-17: 20000 [IU] via SUBCUTANEOUS

## 2015-01-11 ENCOUNTER — Other Ambulatory Visit (HOSPITAL_COMMUNITY): Payer: Self-pay

## 2015-01-14 ENCOUNTER — Encounter (HOSPITAL_COMMUNITY)
Admission: RE | Admit: 2015-01-14 | Discharge: 2015-01-14 | Disposition: A | Discharge status: Home / Self Care | Payer: Medicare Other | Source: Ambulatory Visit | Attending: Nephrology | Admitting: Nephrology

## 2015-01-14 DIAGNOSIS — D631 Anemia in chronic kidney disease: Secondary | ICD-10-CM | POA: Insufficient documentation

## 2015-01-14 DIAGNOSIS — N183 Chronic kidney disease, stage 3 (moderate): Secondary | ICD-10-CM | POA: Insufficient documentation

## 2015-01-14 DIAGNOSIS — N289 Disorder of kidney and ureter, unspecified: Secondary | ICD-10-CM

## 2015-01-14 LAB — IRON AND TIBC
IRON: 63 ug/dL (ref 45–182)
Saturation Ratios: 18 % (ref 17.9–39.5)
TIBC: 343 ug/dL (ref 250–450)
UIBC: 280 ug/dL

## 2015-01-14 LAB — FERRITIN: FERRITIN: 791 ng/mL — AB (ref 24–336)

## 2015-01-14 LAB — POCT HEMOGLOBIN-HEMACUE: Hemoglobin: 10.2 g/dL — ABNORMAL LOW (ref 13.0–17.0)

## 2015-01-14 MED ORDER — EPOETIN ALFA 20000 UNIT/ML IJ SOLN
20000.0000 [IU] | INTRAMUSCULAR | Status: DC
  Administered 2015-01-14: 20000 [IU] via SUBCUTANEOUS

## 2015-01-14 MED ORDER — EPOETIN ALFA 20000 UNIT/ML IJ SOLN
INTRAMUSCULAR | Status: AC
  Filled 2015-01-14: qty 1

## 2015-02-08 ENCOUNTER — Other Ambulatory Visit (HOSPITAL_COMMUNITY): Payer: Self-pay | Admitting: *Deleted

## 2015-02-11 ENCOUNTER — Encounter (HOSPITAL_COMMUNITY)
Admission: RE | Admit: 2015-02-11 | Discharge: 2015-02-11 | Disposition: A | Discharge status: Home / Self Care | Payer: Medicare Other | Source: Ambulatory Visit | Attending: Nephrology | Admitting: Nephrology

## 2015-02-11 DIAGNOSIS — N183 Chronic kidney disease, stage 3 (moderate): Secondary | ICD-10-CM | POA: Insufficient documentation

## 2015-02-11 DIAGNOSIS — N289 Disorder of kidney and ureter, unspecified: Secondary | ICD-10-CM

## 2015-02-11 DIAGNOSIS — D631 Anemia in chronic kidney disease: Secondary | ICD-10-CM | POA: Insufficient documentation

## 2015-02-11 LAB — IRON AND TIBC
Iron: 89 ug/dL (ref 45–182)
Saturation Ratios: 27 % (ref 17.9–39.5)
TIBC: 333 ug/dL (ref 250–450)
UIBC: 244 ug/dL

## 2015-02-11 LAB — POCT HEMOGLOBIN-HEMACUE: Hemoglobin: 10.3 g/dL — ABNORMAL LOW (ref 13.0–17.0)

## 2015-02-11 LAB — FERRITIN: Ferritin: 802 ng/mL — ABNORMAL HIGH (ref 24–336)

## 2015-02-11 MED ORDER — EPOETIN ALFA 20000 UNIT/ML IJ SOLN
20000.0000 [IU] | INTRAMUSCULAR | Status: DC
  Administered 2015-02-11: 20000 [IU] via SUBCUTANEOUS

## 2015-02-11 MED ORDER — FERUMOXYTOL INJECTION 510 MG/17 ML
510.0000 mg | Freq: Once | INTRAVENOUS | Status: AC
  Administered 2015-02-11: 510 mg via INTRAVENOUS
  Filled 2015-02-11: qty 17

## 2015-02-11 MED ORDER — EPOETIN ALFA 20000 UNIT/ML IJ SOLN
INTRAMUSCULAR | Status: AC
  Filled 2015-02-11: qty 1

## 2015-02-15 DIAGNOSIS — N401 Enlarged prostate with lower urinary tract symptoms: Secondary | ICD-10-CM | POA: Diagnosis not present

## 2015-02-21 DIAGNOSIS — Z23 Encounter for immunization: Secondary | ICD-10-CM | POA: Diagnosis not present

## 2015-02-22 DIAGNOSIS — R972 Elevated prostate specific antigen [PSA]: Secondary | ICD-10-CM | POA: Diagnosis not present

## 2015-02-22 DIAGNOSIS — R351 Nocturia: Secondary | ICD-10-CM | POA: Diagnosis not present

## 2015-03-05 DIAGNOSIS — N2581 Secondary hyperparathyroidism of renal origin: Secondary | ICD-10-CM | POA: Diagnosis not present

## 2015-03-05 DIAGNOSIS — E1129 Type 2 diabetes mellitus with other diabetic kidney complication: Secondary | ICD-10-CM | POA: Diagnosis not present

## 2015-03-05 DIAGNOSIS — M109 Gout, unspecified: Secondary | ICD-10-CM | POA: Diagnosis not present

## 2015-03-05 DIAGNOSIS — D631 Anemia in chronic kidney disease: Secondary | ICD-10-CM | POA: Diagnosis not present

## 2015-03-05 DIAGNOSIS — I129 Hypertensive chronic kidney disease with stage 1 through stage 4 chronic kidney disease, or unspecified chronic kidney disease: Secondary | ICD-10-CM | POA: Diagnosis not present

## 2015-03-05 DIAGNOSIS — N183 Chronic kidney disease, stage 3 (moderate): Secondary | ICD-10-CM | POA: Diagnosis not present

## 2015-03-11 ENCOUNTER — Encounter (HOSPITAL_COMMUNITY): Payer: Medicare Other

## 2015-03-21 ENCOUNTER — Encounter (HOSPITAL_COMMUNITY)
Admission: RE | Admit: 2015-03-21 | Discharge: 2015-03-21 | Disposition: A | Discharge status: Home / Self Care | Payer: Medicare Other | Source: Ambulatory Visit | Attending: Nephrology | Admitting: Nephrology

## 2015-03-21 DIAGNOSIS — D631 Anemia in chronic kidney disease: Secondary | ICD-10-CM | POA: Insufficient documentation

## 2015-03-21 DIAGNOSIS — N183 Chronic kidney disease, stage 3 (moderate): Secondary | ICD-10-CM | POA: Diagnosis not present

## 2015-03-21 DIAGNOSIS — Z5181 Encounter for therapeutic drug level monitoring: Secondary | ICD-10-CM | POA: Diagnosis not present

## 2015-03-21 DIAGNOSIS — Z79899 Other long term (current) drug therapy: Secondary | ICD-10-CM | POA: Insufficient documentation

## 2015-03-21 DIAGNOSIS — N289 Disorder of kidney and ureter, unspecified: Secondary | ICD-10-CM

## 2015-03-21 LAB — FERRITIN: FERRITIN: 1161 ng/mL — AB (ref 24–336)

## 2015-03-21 LAB — IRON AND TIBC
IRON: 132 ug/dL (ref 45–182)
SATURATION RATIOS: 38 % (ref 17.9–39.5)
TIBC: 343 ug/dL (ref 250–450)
UIBC: 211 ug/dL

## 2015-03-21 LAB — POCT HEMOGLOBIN-HEMACUE: HEMOGLOBIN: 10.6 g/dL — AB (ref 13.0–17.0)

## 2015-03-21 MED ORDER — EPOETIN ALFA 20000 UNIT/ML IJ SOLN
INTRAMUSCULAR | Status: AC
  Filled 2015-03-21: qty 1

## 2015-03-21 MED ORDER — EPOETIN ALFA 20000 UNIT/ML IJ SOLN
20000.0000 [IU] | INTRAMUSCULAR | Status: DC
  Administered 2015-03-21: 20000 [IU] via SUBCUTANEOUS

## 2015-04-17 ENCOUNTER — Other Ambulatory Visit (HOSPITAL_COMMUNITY): Payer: Self-pay | Admitting: *Deleted

## 2015-04-18 ENCOUNTER — Encounter (HOSPITAL_COMMUNITY)
Admission: RE | Admit: 2015-04-18 | Discharge: 2015-04-18 | Disposition: A | Discharge status: Home / Self Care | Payer: Medicare Other | Source: Ambulatory Visit | Attending: Nephrology | Admitting: Nephrology

## 2015-04-18 DIAGNOSIS — N183 Chronic kidney disease, stage 3 (moderate): Secondary | ICD-10-CM | POA: Diagnosis not present

## 2015-04-18 DIAGNOSIS — D631 Anemia in chronic kidney disease: Secondary | ICD-10-CM | POA: Diagnosis not present

## 2015-04-18 DIAGNOSIS — N289 Disorder of kidney and ureter, unspecified: Secondary | ICD-10-CM

## 2015-04-18 LAB — IRON AND TIBC
IRON: 98 ug/dL (ref 45–182)
Saturation Ratios: 29 % (ref 17.9–39.5)
TIBC: 336 ug/dL (ref 250–450)
UIBC: 238 ug/dL

## 2015-04-18 LAB — POCT HEMOGLOBIN-HEMACUE: Hemoglobin: 11.1 g/dL — ABNORMAL LOW (ref 13.0–17.0)

## 2015-04-18 LAB — FERRITIN: FERRITIN: 1214 ng/mL — AB (ref 24–336)

## 2015-04-18 MED ORDER — EPOETIN ALFA 20000 UNIT/ML IJ SOLN
20000.0000 [IU] | INTRAMUSCULAR | Status: DC
  Administered 2015-04-18: 20000 [IU] via SUBCUTANEOUS

## 2015-04-18 MED ORDER — EPOETIN ALFA 20000 UNIT/ML IJ SOLN
INTRAMUSCULAR | Status: AC
  Filled 2015-04-18: qty 1

## 2015-05-03 DIAGNOSIS — M5441 Lumbago with sciatica, right side: Secondary | ICD-10-CM | POA: Diagnosis not present

## 2015-05-03 DIAGNOSIS — R51 Headache: Secondary | ICD-10-CM | POA: Diagnosis not present

## 2015-05-03 DIAGNOSIS — E1122 Type 2 diabetes mellitus with diabetic chronic kidney disease: Secondary | ICD-10-CM | POA: Diagnosis not present

## 2015-05-03 DIAGNOSIS — Z87891 Personal history of nicotine dependence: Secondary | ICD-10-CM | POA: Diagnosis not present

## 2015-05-14 ENCOUNTER — Encounter (HOSPITAL_COMMUNITY)
Admission: RE | Admit: 2015-05-14 | Discharge: 2015-05-14 | Disposition: A | Discharge status: Home / Self Care | Payer: Medicare Other | Source: Ambulatory Visit | Attending: Nephrology | Admitting: Nephrology

## 2015-05-14 DIAGNOSIS — N289 Disorder of kidney and ureter, unspecified: Secondary | ICD-10-CM

## 2015-05-14 DIAGNOSIS — D631 Anemia in chronic kidney disease: Secondary | ICD-10-CM | POA: Insufficient documentation

## 2015-05-14 DIAGNOSIS — N183 Chronic kidney disease, stage 3 (moderate): Secondary | ICD-10-CM | POA: Diagnosis not present

## 2015-05-14 LAB — FERRITIN: FERRITIN: 782 ng/mL — AB (ref 24–336)

## 2015-05-14 LAB — IRON AND TIBC
Iron: 63 ug/dL (ref 45–182)
Saturation Ratios: 18 % (ref 17.9–39.5)
TIBC: 350 ug/dL (ref 250–450)
UIBC: 287 ug/dL

## 2015-05-14 LAB — POCT HEMOGLOBIN-HEMACUE: Hemoglobin: 11.2 g/dL — ABNORMAL LOW (ref 13.0–17.0)

## 2015-05-14 MED ORDER — EPOETIN ALFA 20000 UNIT/ML IJ SOLN
INTRAMUSCULAR | Status: AC
  Filled 2015-05-14: qty 1

## 2015-05-14 MED ORDER — EPOETIN ALFA 20000 UNIT/ML IJ SOLN
20000.0000 [IU] | INTRAMUSCULAR | Status: DC
  Administered 2015-05-14: 20000 [IU] via SUBCUTANEOUS

## 2015-05-15 ENCOUNTER — Encounter (HOSPITAL_COMMUNITY): Payer: BLUE CROSS/BLUE SHIELD

## 2015-06-04 DIAGNOSIS — M545 Low back pain: Secondary | ICD-10-CM | POA: Diagnosis not present

## 2015-06-04 DIAGNOSIS — M25551 Pain in right hip: Secondary | ICD-10-CM | POA: Diagnosis not present

## 2015-06-04 DIAGNOSIS — M889 Osteitis deformans of unspecified bone: Secondary | ICD-10-CM | POA: Diagnosis not present

## 2015-06-06 ENCOUNTER — Other Ambulatory Visit: Payer: Self-pay | Admitting: Orthopaedic Surgery

## 2015-06-06 DIAGNOSIS — M545 Low back pain: Secondary | ICD-10-CM

## 2015-06-11 ENCOUNTER — Encounter (HOSPITAL_COMMUNITY)
Admission: RE | Admit: 2015-06-11 | Discharge: 2015-06-11 | Disposition: A | Discharge status: Home / Self Care | Payer: Medicare Other | Source: Ambulatory Visit | Attending: Nephrology | Admitting: Nephrology

## 2015-06-11 DIAGNOSIS — N289 Disorder of kidney and ureter, unspecified: Secondary | ICD-10-CM

## 2015-06-11 DIAGNOSIS — D631 Anemia in chronic kidney disease: Secondary | ICD-10-CM | POA: Insufficient documentation

## 2015-06-11 DIAGNOSIS — N183 Chronic kidney disease, stage 3 (moderate): Secondary | ICD-10-CM | POA: Insufficient documentation

## 2015-06-11 LAB — POCT HEMOGLOBIN-HEMACUE: Hemoglobin: 11.4 g/dL — ABNORMAL LOW (ref 13.0–17.0)

## 2015-06-11 MED ORDER — EPOETIN ALFA 20000 UNIT/ML IJ SOLN
20000.0000 [IU] | INTRAMUSCULAR | Status: DC
  Administered 2015-06-11: 20000 [IU] via SUBCUTANEOUS

## 2015-06-11 MED ORDER — EPOETIN ALFA 20000 UNIT/ML IJ SOLN
INTRAMUSCULAR | Status: AC
  Administered 2015-06-11: 20000 [IU] via SUBCUTANEOUS
  Filled 2015-06-11: qty 1

## 2015-06-12 LAB — IRON AND TIBC
Iron: 95 ug/dL (ref 45–182)
Saturation Ratios: 27 % (ref 17.9–39.5)
TIBC: 358 ug/dL (ref 250–450)
UIBC: 263 ug/dL

## 2015-06-12 LAB — FERRITIN: FERRITIN: 942 ng/mL — AB (ref 24–336)

## 2015-06-17 ENCOUNTER — Other Ambulatory Visit (HOSPITAL_COMMUNITY): Payer: Self-pay | Admitting: *Deleted

## 2015-06-17 ENCOUNTER — Ambulatory Visit
Admission: RE | Admit: 2015-06-17 | Discharge: 2015-06-17 | Disposition: A | Discharge status: Home / Self Care | Payer: Medicare Other | Source: Ambulatory Visit | Attending: Orthopaedic Surgery | Admitting: Orthopaedic Surgery

## 2015-06-17 DIAGNOSIS — M545 Low back pain: Secondary | ICD-10-CM

## 2015-06-17 DIAGNOSIS — M5126 Other intervertebral disc displacement, lumbar region: Secondary | ICD-10-CM | POA: Diagnosis not present

## 2015-06-18 ENCOUNTER — Inpatient Hospital Stay (HOSPITAL_COMMUNITY): Admission: RE | Admit: 2015-06-18 | Payer: Medicare Other | Source: Ambulatory Visit

## 2015-06-19 DIAGNOSIS — M545 Low back pain: Secondary | ICD-10-CM | POA: Diagnosis not present

## 2015-06-21 DIAGNOSIS — H524 Presbyopia: Secondary | ICD-10-CM | POA: Diagnosis not present

## 2015-06-21 DIAGNOSIS — H35033 Hypertensive retinopathy, bilateral: Secondary | ICD-10-CM | POA: Diagnosis not present

## 2015-06-21 DIAGNOSIS — H3509 Other intraretinal microvascular abnormalities: Secondary | ICD-10-CM | POA: Diagnosis not present

## 2015-06-21 DIAGNOSIS — H353130 Nonexudative age-related macular degeneration, bilateral, stage unspecified: Secondary | ICD-10-CM | POA: Diagnosis not present

## 2015-06-21 DIAGNOSIS — Z961 Presence of intraocular lens: Secondary | ICD-10-CM | POA: Diagnosis not present

## 2015-06-21 DIAGNOSIS — Z23 Encounter for immunization: Secondary | ICD-10-CM | POA: Diagnosis not present

## 2015-07-09 ENCOUNTER — Encounter (HOSPITAL_COMMUNITY): Payer: Medicare Other

## 2015-07-17 ENCOUNTER — Encounter (HOSPITAL_COMMUNITY)
Admission: RE | Admit: 2015-07-17 | Discharge: 2015-07-17 | Disposition: A | Discharge status: Home / Self Care | Payer: Medicare Other | Source: Ambulatory Visit | Attending: Nephrology | Admitting: Nephrology

## 2015-07-17 DIAGNOSIS — D631 Anemia in chronic kidney disease: Secondary | ICD-10-CM | POA: Insufficient documentation

## 2015-07-17 DIAGNOSIS — N183 Chronic kidney disease, stage 3 (moderate): Secondary | ICD-10-CM | POA: Insufficient documentation

## 2015-07-17 DIAGNOSIS — N289 Disorder of kidney and ureter, unspecified: Secondary | ICD-10-CM

## 2015-07-17 LAB — IRON AND TIBC
Iron: 79 ug/dL (ref 45–182)
SATURATION RATIOS: 23 % (ref 17.9–39.5)
TIBC: 339 ug/dL (ref 250–450)
UIBC: 260 ug/dL

## 2015-07-17 LAB — FERRITIN: Ferritin: 914 ng/mL — ABNORMAL HIGH (ref 24–336)

## 2015-07-17 MED ORDER — EPOETIN ALFA 20000 UNIT/ML IJ SOLN
20000.0000 [IU] | INTRAMUSCULAR | Status: DC
  Administered 2015-07-17: 20000 [IU] via SUBCUTANEOUS

## 2015-07-17 MED ORDER — EPOETIN ALFA 20000 UNIT/ML IJ SOLN
INTRAMUSCULAR | Status: AC
  Administered 2015-07-17: 20000 [IU] via SUBCUTANEOUS
  Filled 2015-07-17: qty 1

## 2015-07-17 MED ORDER — SODIUM CHLORIDE 0.9 % IV SOLN
510.0000 mg | Freq: Once | INTRAVENOUS | Status: AC
  Administered 2015-07-17: 510 mg via INTRAVENOUS
  Filled 2015-07-17: qty 17

## 2015-07-19 LAB — POCT HEMOGLOBIN-HEMACUE: Hemoglobin: 10.2 g/dL — ABNORMAL LOW (ref 13.0–17.0)

## 2015-07-29 DIAGNOSIS — E1122 Type 2 diabetes mellitus with diabetic chronic kidney disease: Secondary | ICD-10-CM | POA: Diagnosis not present

## 2015-07-29 DIAGNOSIS — K219 Gastro-esophageal reflux disease without esophagitis: Secondary | ICD-10-CM | POA: Diagnosis not present

## 2015-07-29 DIAGNOSIS — N183 Chronic kidney disease, stage 3 (moderate): Secondary | ICD-10-CM | POA: Diagnosis not present

## 2015-07-29 DIAGNOSIS — N08 Glomerular disorders in diseases classified elsewhere: Secondary | ICD-10-CM | POA: Diagnosis not present

## 2015-07-30 ENCOUNTER — Other Ambulatory Visit (HOSPITAL_COMMUNITY): Payer: Self-pay | Admitting: *Deleted

## 2015-07-31 ENCOUNTER — Inpatient Hospital Stay (HOSPITAL_COMMUNITY): Admission: RE | Admit: 2015-07-31 | Payer: Medicare Other | Source: Ambulatory Visit

## 2015-08-13 ENCOUNTER — Other Ambulatory Visit (HOSPITAL_COMMUNITY): Payer: Self-pay | Admitting: *Deleted

## 2015-08-14 ENCOUNTER — Encounter (HOSPITAL_COMMUNITY)
Admission: RE | Admit: 2015-08-14 | Discharge: 2015-08-14 | Disposition: A | Discharge status: Home / Self Care | Payer: Medicare Other | Source: Ambulatory Visit | Attending: Nephrology | Admitting: Nephrology

## 2015-08-14 DIAGNOSIS — D631 Anemia in chronic kidney disease: Secondary | ICD-10-CM | POA: Diagnosis not present

## 2015-08-14 DIAGNOSIS — N289 Disorder of kidney and ureter, unspecified: Secondary | ICD-10-CM

## 2015-08-14 DIAGNOSIS — N183 Chronic kidney disease, stage 3 (moderate): Secondary | ICD-10-CM | POA: Diagnosis not present

## 2015-08-14 LAB — FERRITIN: Ferritin: 1299 ng/mL — ABNORMAL HIGH (ref 24–336)

## 2015-08-14 LAB — IRON AND TIBC
IRON: 123 ug/dL (ref 45–182)
SATURATION RATIOS: 35 % (ref 17.9–39.5)
TIBC: 351 ug/dL (ref 250–450)
UIBC: 228 ug/dL

## 2015-08-14 LAB — POCT HEMOGLOBIN-HEMACUE: HEMOGLOBIN: 10 g/dL — AB (ref 13.0–17.0)

## 2015-08-14 MED ORDER — EPOETIN ALFA 20000 UNIT/ML IJ SOLN
INTRAMUSCULAR | Status: AC
  Filled 2015-08-14: qty 1

## 2015-08-14 MED ORDER — SODIUM CHLORIDE 0.9 % IV SOLN
510.0000 mg | Freq: Once | INTRAVENOUS | Status: AC
  Administered 2015-08-14: 510 mg via INTRAVENOUS
  Filled 2015-08-14: qty 17

## 2015-08-14 MED ORDER — EPOETIN ALFA 20000 UNIT/ML IJ SOLN
20000.0000 [IU] | INTRAMUSCULAR | Status: DC
  Administered 2015-08-14: 20000 [IU] via SUBCUTANEOUS

## 2015-08-21 ENCOUNTER — Other Ambulatory Visit: Payer: Self-pay | Admitting: Nurse Practitioner

## 2015-08-21 DIAGNOSIS — R110 Nausea: Secondary | ICD-10-CM

## 2015-08-21 DIAGNOSIS — K219 Gastro-esophageal reflux disease without esophagitis: Secondary | ICD-10-CM

## 2015-08-27 ENCOUNTER — Ambulatory Visit
Admission: RE | Admit: 2015-08-27 | Discharge: 2015-08-27 | Disposition: A | Discharge status: Home / Self Care | Payer: Medicare Other | Source: Ambulatory Visit | Attending: Nurse Practitioner | Admitting: Nurse Practitioner

## 2015-08-27 DIAGNOSIS — K219 Gastro-esophageal reflux disease without esophagitis: Secondary | ICD-10-CM

## 2015-08-27 DIAGNOSIS — R110 Nausea: Secondary | ICD-10-CM

## 2015-08-28 ENCOUNTER — Other Ambulatory Visit: Payer: Medicare Other

## 2015-09-04 DIAGNOSIS — J069 Acute upper respiratory infection, unspecified: Secondary | ICD-10-CM | POA: Diagnosis not present

## 2015-09-04 DIAGNOSIS — I129 Hypertensive chronic kidney disease with stage 1 through stage 4 chronic kidney disease, or unspecified chronic kidney disease: Secondary | ICD-10-CM | POA: Diagnosis not present

## 2015-09-04 DIAGNOSIS — R110 Nausea: Secondary | ICD-10-CM | POA: Diagnosis not present

## 2015-09-04 DIAGNOSIS — N183 Chronic kidney disease, stage 3 (moderate): Secondary | ICD-10-CM | POA: Diagnosis not present

## 2015-09-09 DIAGNOSIS — R634 Abnormal weight loss: Secondary | ICD-10-CM | POA: Diagnosis not present

## 2015-09-09 DIAGNOSIS — R1310 Dysphagia, unspecified: Secondary | ICD-10-CM | POA: Diagnosis not present

## 2015-09-09 DIAGNOSIS — R112 Nausea with vomiting, unspecified: Secondary | ICD-10-CM | POA: Diagnosis not present

## 2015-09-09 DIAGNOSIS — R197 Diarrhea, unspecified: Secondary | ICD-10-CM | POA: Diagnosis not present

## 2015-09-11 ENCOUNTER — Encounter (HOSPITAL_COMMUNITY)
Admission: RE | Admit: 2015-09-11 | Discharge: 2015-09-11 | Disposition: A | Discharge status: Home / Self Care | Payer: Medicare Other | Source: Ambulatory Visit | Attending: Nephrology | Admitting: Nephrology

## 2015-09-11 DIAGNOSIS — D631 Anemia in chronic kidney disease: Secondary | ICD-10-CM | POA: Insufficient documentation

## 2015-09-11 DIAGNOSIS — N183 Chronic kidney disease, stage 3 (moderate): Secondary | ICD-10-CM | POA: Insufficient documentation

## 2015-09-11 DIAGNOSIS — N289 Disorder of kidney and ureter, unspecified: Secondary | ICD-10-CM

## 2015-09-11 LAB — IRON AND TIBC
IRON: 124 ug/dL (ref 45–182)
SATURATION RATIOS: 38 % (ref 17.9–39.5)
TIBC: 326 ug/dL (ref 250–450)
UIBC: 202 ug/dL

## 2015-09-11 LAB — POCT HEMOGLOBIN-HEMACUE: HEMOGLOBIN: 10.8 g/dL — AB (ref 13.0–17.0)

## 2015-09-11 LAB — FERRITIN: Ferritin: 1897 ng/mL — ABNORMAL HIGH (ref 24–336)

## 2015-09-11 MED ORDER — EPOETIN ALFA 20000 UNIT/ML IJ SOLN
INTRAMUSCULAR | Status: AC
  Administered 2015-09-11: 20000 [IU]
  Filled 2015-09-11: qty 1

## 2015-09-11 MED ORDER — EPOETIN ALFA 20000 UNIT/ML IJ SOLN: 20000.0000 [IU] | INTRAMUSCULAR | Status: DC

## 2015-09-24 DIAGNOSIS — R1310 Dysphagia, unspecified: Secondary | ICD-10-CM | POA: Diagnosis not present

## 2015-09-24 DIAGNOSIS — R112 Nausea with vomiting, unspecified: Secondary | ICD-10-CM | POA: Diagnosis not present

## 2015-09-24 DIAGNOSIS — R634 Abnormal weight loss: Secondary | ICD-10-CM | POA: Diagnosis not present

## 2015-09-25 ENCOUNTER — Other Ambulatory Visit: Payer: Self-pay | Admitting: Gastroenterology

## 2015-09-25 DIAGNOSIS — R110 Nausea: Secondary | ICD-10-CM

## 2015-10-01 ENCOUNTER — Ambulatory Visit (HOSPITAL_COMMUNITY)
Admission: RE | Admit: 2015-10-01 | Discharge: 2015-10-01 | Disposition: A | Discharge status: Home / Self Care | Payer: Medicare Other | Source: Ambulatory Visit | Attending: Gastroenterology | Admitting: Gastroenterology

## 2015-10-01 ENCOUNTER — Encounter (INDEPENDENT_AMBULATORY_CARE_PROVIDER_SITE_OTHER): Payer: Self-pay

## 2015-10-01 DIAGNOSIS — R140 Abdominal distension (gaseous): Secondary | ICD-10-CM | POA: Insufficient documentation

## 2015-10-01 DIAGNOSIS — R112 Nausea with vomiting, unspecified: Secondary | ICD-10-CM | POA: Insufficient documentation

## 2015-10-01 DIAGNOSIS — K219 Gastro-esophageal reflux disease without esophagitis: Secondary | ICD-10-CM | POA: Diagnosis not present

## 2015-10-01 DIAGNOSIS — E119 Type 2 diabetes mellitus without complications: Secondary | ICD-10-CM | POA: Diagnosis not present

## 2015-10-01 DIAGNOSIS — R110 Nausea: Secondary | ICD-10-CM

## 2015-10-01 MED ORDER — TECHNETIUM TC 99M SULFUR COLLOID
1.9200 | Freq: Once | INTRAVENOUS | Status: AC | PRN
  Administered 2015-10-01: 1.92 via ORAL

## 2015-10-08 ENCOUNTER — Other Ambulatory Visit (HOSPITAL_COMMUNITY): Payer: Self-pay | Admitting: *Deleted

## 2015-10-09 ENCOUNTER — Encounter (HOSPITAL_COMMUNITY)
Admission: RE | Admit: 2015-10-09 | Discharge: 2015-10-09 | Disposition: A | Discharge status: Home / Self Care | Payer: Medicare Other | Source: Ambulatory Visit | Attending: Nephrology | Admitting: Nephrology

## 2015-10-09 DIAGNOSIS — N183 Chronic kidney disease, stage 3 (moderate): Secondary | ICD-10-CM | POA: Diagnosis not present

## 2015-10-09 DIAGNOSIS — D631 Anemia in chronic kidney disease: Secondary | ICD-10-CM | POA: Diagnosis not present

## 2015-10-09 DIAGNOSIS — N289 Disorder of kidney and ureter, unspecified: Secondary | ICD-10-CM

## 2015-10-09 LAB — IRON AND TIBC
Iron: 95 ug/dL (ref 45–182)
Saturation Ratios: 26 % (ref 17.9–39.5)
TIBC: 365 ug/dL (ref 250–450)
UIBC: 270 ug/dL

## 2015-10-09 LAB — FERRITIN: FERRITIN: 1423 ng/mL — AB (ref 24–336)

## 2015-10-09 MED ORDER — EPOETIN ALFA 20000 UNIT/ML IJ SOLN
20000.0000 [IU] | INTRAMUSCULAR | Status: DC
  Administered 2015-10-09: 20000 [IU] via SUBCUTANEOUS

## 2015-10-09 MED ORDER — EPOETIN ALFA 20000 UNIT/ML IJ SOLN
INTRAMUSCULAR | Status: AC
  Filled 2015-10-09: qty 1

## 2015-10-10 LAB — POCT HEMOGLOBIN-HEMACUE: Hemoglobin: 11 g/dL — ABNORMAL LOW (ref 13.0–17.0)

## 2015-11-06 ENCOUNTER — Encounter (HOSPITAL_COMMUNITY)
Admission: RE | Admit: 2015-11-06 | Discharge: 2015-11-06 | Disposition: A | Discharge status: Home / Self Care | Payer: Medicare Other | Source: Ambulatory Visit | Attending: Nephrology | Admitting: Nephrology

## 2015-11-06 DIAGNOSIS — N289 Disorder of kidney and ureter, unspecified: Secondary | ICD-10-CM

## 2015-11-06 DIAGNOSIS — N183 Chronic kidney disease, stage 3 (moderate): Secondary | ICD-10-CM | POA: Insufficient documentation

## 2015-11-06 DIAGNOSIS — D631 Anemia in chronic kidney disease: Secondary | ICD-10-CM | POA: Diagnosis not present

## 2015-11-06 LAB — FERRITIN: Ferritin: 1209 ng/mL — ABNORMAL HIGH (ref 24–336)

## 2015-11-06 LAB — POCT HEMOGLOBIN-HEMACUE: HEMOGLOBIN: 8.5 g/dL — AB (ref 13.0–17.0)

## 2015-11-06 LAB — IRON AND TIBC
IRON: 85 ug/dL (ref 45–182)
SATURATION RATIOS: 25 % (ref 17.9–39.5)
TIBC: 344 ug/dL (ref 250–450)
UIBC: 259 ug/dL

## 2015-11-06 MED ORDER — EPOETIN ALFA 20000 UNIT/ML IJ SOLN
20000.0000 [IU] | INTRAMUSCULAR | Status: DC
  Administered 2015-11-06: 20000 [IU] via SUBCUTANEOUS

## 2015-11-06 MED ORDER — EPOETIN ALFA 20000 UNIT/ML IJ SOLN
INTRAMUSCULAR | Status: AC
  Filled 2015-11-06: qty 1

## 2015-11-08 DIAGNOSIS — D509 Iron deficiency anemia, unspecified: Secondary | ICD-10-CM | POA: Diagnosis not present

## 2015-11-20 ENCOUNTER — Encounter (HOSPITAL_COMMUNITY)
Admission: RE | Admit: 2015-11-20 | Discharge: 2015-11-20 | Disposition: A | Discharge status: Home / Self Care | Payer: Medicare Other | Source: Ambulatory Visit | Attending: Nephrology | Admitting: Nephrology

## 2015-11-20 DIAGNOSIS — D631 Anemia in chronic kidney disease: Secondary | ICD-10-CM | POA: Diagnosis not present

## 2015-11-20 DIAGNOSIS — N289 Disorder of kidney and ureter, unspecified: Secondary | ICD-10-CM

## 2015-11-20 DIAGNOSIS — N183 Chronic kidney disease, stage 3 (moderate): Secondary | ICD-10-CM | POA: Diagnosis not present

## 2015-11-20 LAB — POCT HEMOGLOBIN-HEMACUE: HEMOGLOBIN: 10.3 g/dL — AB (ref 13.0–17.0)

## 2015-11-20 MED ORDER — EPOETIN ALFA 20000 UNIT/ML IJ SOLN
20000.0000 [IU] | INTRAMUSCULAR | Status: DC
  Administered 2015-11-20: 20000 [IU] via SUBCUTANEOUS

## 2015-11-20 MED ORDER — EPOETIN ALFA 20000 UNIT/ML IJ SOLN
INTRAMUSCULAR | Status: AC
  Filled 2015-11-20: qty 1

## 2015-12-04 ENCOUNTER — Encounter (HOSPITAL_COMMUNITY): Payer: Medicare Other

## 2015-12-04 ENCOUNTER — Encounter (HOSPITAL_COMMUNITY)
Admission: RE | Admit: 2015-12-04 | Discharge: 2015-12-04 | Disposition: A | Discharge status: Home / Self Care | Payer: Medicare Other | Source: Ambulatory Visit | Attending: Nephrology | Admitting: Nephrology

## 2015-12-04 DIAGNOSIS — D631 Anemia in chronic kidney disease: Secondary | ICD-10-CM | POA: Diagnosis not present

## 2015-12-04 DIAGNOSIS — N289 Disorder of kidney and ureter, unspecified: Secondary | ICD-10-CM

## 2015-12-04 DIAGNOSIS — N183 Chronic kidney disease, stage 3 (moderate): Secondary | ICD-10-CM | POA: Diagnosis not present

## 2015-12-04 LAB — FERRITIN: Ferritin: 1233 ng/mL — ABNORMAL HIGH (ref 24–336)

## 2015-12-04 LAB — IRON AND TIBC
IRON: 92 ug/dL (ref 45–182)
SATURATION RATIOS: 26 % (ref 17.9–39.5)
TIBC: 350 ug/dL (ref 250–450)
UIBC: 258 ug/dL

## 2015-12-04 LAB — POCT HEMOGLOBIN-HEMACUE: Hemoglobin: 10.6 g/dL — ABNORMAL LOW (ref 13.0–17.0)

## 2015-12-04 MED ORDER — EPOETIN ALFA 20000 UNIT/ML IJ SOLN
INTRAMUSCULAR | Status: AC
  Filled 2015-12-04: qty 1

## 2015-12-04 MED ORDER — EPOETIN ALFA 20000 UNIT/ML IJ SOLN
20000.0000 [IU] | INTRAMUSCULAR | Status: DC
  Administered 2015-12-04: 20000 [IU] via SUBCUTANEOUS

## 2015-12-17 DIAGNOSIS — M545 Low back pain: Secondary | ICD-10-CM | POA: Diagnosis not present

## 2015-12-18 ENCOUNTER — Encounter (HOSPITAL_COMMUNITY)
Admission: RE | Admit: 2015-12-18 | Discharge: 2015-12-18 | Disposition: A | Discharge status: Home / Self Care | Payer: Medicare Other | Source: Ambulatory Visit | Attending: Nephrology | Admitting: Nephrology

## 2015-12-18 DIAGNOSIS — D631 Anemia in chronic kidney disease: Secondary | ICD-10-CM | POA: Diagnosis not present

## 2015-12-18 DIAGNOSIS — N289 Disorder of kidney and ureter, unspecified: Secondary | ICD-10-CM

## 2015-12-18 DIAGNOSIS — N183 Chronic kidney disease, stage 3 (moderate): Secondary | ICD-10-CM | POA: Diagnosis not present

## 2015-12-18 LAB — POCT HEMOGLOBIN-HEMACUE: Hemoglobin: 10.4 g/dL — ABNORMAL LOW (ref 13.0–17.0)

## 2015-12-18 MED ORDER — EPOETIN ALFA 20000 UNIT/ML IJ SOLN
INTRAMUSCULAR | Status: AC
  Filled 2015-12-18: qty 1

## 2015-12-18 MED ORDER — EPOETIN ALFA 20000 UNIT/ML IJ SOLN
20000.0000 [IU] | INTRAMUSCULAR | Status: DC
  Administered 2015-12-18: 20000 [IU] via SUBCUTANEOUS

## 2015-12-27 DIAGNOSIS — N400 Enlarged prostate without lower urinary tract symptoms: Secondary | ICD-10-CM | POA: Diagnosis not present

## 2015-12-27 DIAGNOSIS — M109 Gout, unspecified: Secondary | ICD-10-CM | POA: Diagnosis not present

## 2015-12-27 DIAGNOSIS — D631 Anemia in chronic kidney disease: Secondary | ICD-10-CM | POA: Diagnosis not present

## 2015-12-27 DIAGNOSIS — K219 Gastro-esophageal reflux disease without esophagitis: Secondary | ICD-10-CM | POA: Diagnosis not present

## 2015-12-27 DIAGNOSIS — I129 Hypertensive chronic kidney disease with stage 1 through stage 4 chronic kidney disease, or unspecified chronic kidney disease: Secondary | ICD-10-CM | POA: Diagnosis not present

## 2015-12-27 DIAGNOSIS — E785 Hyperlipidemia, unspecified: Secondary | ICD-10-CM | POA: Diagnosis not present

## 2015-12-27 DIAGNOSIS — Z720 Tobacco use: Secondary | ICD-10-CM | POA: Diagnosis not present

## 2015-12-27 DIAGNOSIS — N2589 Other disorders resulting from impaired renal tubular function: Secondary | ICD-10-CM | POA: Diagnosis not present

## 2015-12-27 DIAGNOSIS — E1129 Type 2 diabetes mellitus with other diabetic kidney complication: Secondary | ICD-10-CM | POA: Diagnosis not present

## 2015-12-27 DIAGNOSIS — N2581 Secondary hyperparathyroidism of renal origin: Secondary | ICD-10-CM | POA: Diagnosis not present

## 2015-12-27 DIAGNOSIS — N183 Chronic kidney disease, stage 3 (moderate): Secondary | ICD-10-CM | POA: Diagnosis not present

## 2015-12-27 DIAGNOSIS — K279 Peptic ulcer, site unspecified, unspecified as acute or chronic, without hemorrhage or perforation: Secondary | ICD-10-CM | POA: Diagnosis not present

## 2015-12-31 ENCOUNTER — Other Ambulatory Visit (HOSPITAL_COMMUNITY): Payer: Self-pay | Admitting: *Deleted

## 2016-01-01 ENCOUNTER — Encounter (HOSPITAL_COMMUNITY): Payer: Medicare Other

## 2016-01-09 ENCOUNTER — Encounter (HOSPITAL_COMMUNITY)
Admission: RE | Admit: 2016-01-09 | Discharge: 2016-01-09 | Disposition: A | Discharge status: Home / Self Care | Payer: Medicare Other | Source: Ambulatory Visit | Attending: Nephrology | Admitting: Nephrology

## 2016-01-09 DIAGNOSIS — N289 Disorder of kidney and ureter, unspecified: Secondary | ICD-10-CM

## 2016-01-09 DIAGNOSIS — N183 Chronic kidney disease, stage 3 (moderate): Secondary | ICD-10-CM | POA: Insufficient documentation

## 2016-01-09 DIAGNOSIS — D631 Anemia in chronic kidney disease: Secondary | ICD-10-CM | POA: Diagnosis not present

## 2016-01-09 LAB — IRON AND TIBC
Iron: 84 ug/dL (ref 45–182)
SATURATION RATIOS: 26 % (ref 17.9–39.5)
TIBC: 322 ug/dL (ref 250–450)
UIBC: 238 ug/dL

## 2016-01-09 LAB — POCT HEMOGLOBIN-HEMACUE: HEMOGLOBIN: 10.4 g/dL — AB (ref 13.0–17.0)

## 2016-01-09 LAB — FERRITIN: Ferritin: 1264 ng/mL — ABNORMAL HIGH (ref 24–336)

## 2016-01-09 MED ORDER — EPOETIN ALFA 20000 UNIT/ML IJ SOLN
20000.0000 [IU] | INTRAMUSCULAR | Status: DC
  Administered 2016-01-09: 20000 [IU] via SUBCUTANEOUS

## 2016-01-09 MED ORDER — EPOETIN ALFA 20000 UNIT/ML IJ SOLN
INTRAMUSCULAR | Status: AC
  Filled 2016-01-09: qty 1

## 2016-01-23 ENCOUNTER — Encounter (HOSPITAL_COMMUNITY)
Admission: RE | Admit: 2016-01-23 | Discharge: 2016-01-23 | Disposition: A | Discharge status: Home / Self Care | Payer: Medicare Other | Source: Ambulatory Visit | Attending: Nephrology | Admitting: Nephrology

## 2016-01-23 DIAGNOSIS — D631 Anemia in chronic kidney disease: Secondary | ICD-10-CM | POA: Insufficient documentation

## 2016-01-23 DIAGNOSIS — N183 Chronic kidney disease, stage 3 (moderate): Secondary | ICD-10-CM | POA: Insufficient documentation

## 2016-01-23 DIAGNOSIS — N289 Disorder of kidney and ureter, unspecified: Secondary | ICD-10-CM

## 2016-01-23 LAB — POCT HEMOGLOBIN-HEMACUE: Hemoglobin: 10.4 g/dL — ABNORMAL LOW (ref 13.0–17.0)

## 2016-01-23 MED ORDER — EPOETIN ALFA 20000 UNIT/ML IJ SOLN
INTRAMUSCULAR | Status: AC
  Administered 2016-01-23: 20000 [IU] via SUBCUTANEOUS
  Filled 2016-01-23: qty 1

## 2016-01-23 MED ORDER — EPOETIN ALFA 20000 UNIT/ML IJ SOLN
20000.0000 [IU] | INTRAMUSCULAR | Status: DC
  Administered 2016-01-23: 20000 [IU] via SUBCUTANEOUS

## 2016-02-06 ENCOUNTER — Encounter (HOSPITAL_COMMUNITY)
Admission: RE | Admit: 2016-02-06 | Discharge: 2016-02-06 | Disposition: A | Discharge status: Home / Self Care | Payer: Medicare Other | Source: Ambulatory Visit | Attending: Nephrology | Admitting: Nephrology

## 2016-02-06 DIAGNOSIS — N183 Chronic kidney disease, stage 3 (moderate): Secondary | ICD-10-CM | POA: Diagnosis not present

## 2016-02-06 DIAGNOSIS — D631 Anemia in chronic kidney disease: Secondary | ICD-10-CM | POA: Diagnosis not present

## 2016-02-06 DIAGNOSIS — N289 Disorder of kidney and ureter, unspecified: Secondary | ICD-10-CM

## 2016-02-06 LAB — FERRITIN: FERRITIN: 1035 ng/mL — AB (ref 24–336)

## 2016-02-06 LAB — IRON AND TIBC
Iron: 86 ug/dL (ref 45–182)
SATURATION RATIOS: 28 % (ref 17.9–39.5)
TIBC: 307 ug/dL (ref 250–450)
UIBC: 221 ug/dL

## 2016-02-06 LAB — POCT HEMOGLOBIN-HEMACUE: HEMOGLOBIN: 10.1 g/dL — AB (ref 13.0–17.0)

## 2016-02-06 MED ORDER — EPOETIN ALFA 20000 UNIT/ML IJ SOLN
INTRAMUSCULAR | Status: AC
  Filled 2016-02-06: qty 1

## 2016-02-06 MED ORDER — EPOETIN ALFA 20000 UNIT/ML IJ SOLN
20000.0000 [IU] | INTRAMUSCULAR | Status: DC
  Administered 2016-02-06: 20000 [IU] via SUBCUTANEOUS

## 2016-02-11 NOTE — Telephone Encounter (Signed)
Ilean China at 08/29/2012 8:46 AM     Status: Signed       Expand All Collapse All   Left Message- advised pt on change in appt due to provider schedule changes.Marland KitchenMarland KitchenMarland Kitchen

## 2016-02-11 NOTE — Telephone Encounter (Signed)
Alec Solis at 08/29/2012 8:46 AM     Status: Signed       Expand All Collapse All   Left Message- advised pt on change in appt due to provider schedule changes.Marland KitchenMarland KitchenMarland Kitchen

## 2016-02-19 ENCOUNTER — Other Ambulatory Visit (HOSPITAL_COMMUNITY): Payer: Self-pay | Admitting: *Deleted

## 2016-02-20 ENCOUNTER — Encounter (HOSPITAL_COMMUNITY)
Admission: RE | Admit: 2016-02-20 | Discharge: 2016-02-20 | Disposition: A | Discharge status: Home / Self Care | Payer: Medicare Other | Source: Ambulatory Visit | Attending: Nephrology | Admitting: Nephrology

## 2016-02-20 DIAGNOSIS — N289 Disorder of kidney and ureter, unspecified: Secondary | ICD-10-CM

## 2016-02-20 DIAGNOSIS — Z79899 Other long term (current) drug therapy: Secondary | ICD-10-CM | POA: Insufficient documentation

## 2016-02-20 DIAGNOSIS — N183 Chronic kidney disease, stage 3 (moderate): Secondary | ICD-10-CM | POA: Insufficient documentation

## 2016-02-20 DIAGNOSIS — D631 Anemia in chronic kidney disease: Secondary | ICD-10-CM | POA: Diagnosis not present

## 2016-02-20 DIAGNOSIS — Z5181 Encounter for therapeutic drug level monitoring: Secondary | ICD-10-CM | POA: Diagnosis not present

## 2016-02-20 DIAGNOSIS — D509 Iron deficiency anemia, unspecified: Secondary | ICD-10-CM | POA: Insufficient documentation

## 2016-02-20 LAB — POCT HEMOGLOBIN-HEMACUE: Hemoglobin: 10.7 g/dL — ABNORMAL LOW (ref 13.0–17.0)

## 2016-02-20 MED ORDER — SODIUM CHLORIDE 0.9 % IV SOLN
510.0000 mg | Freq: Once | INTRAVENOUS | Status: AC
  Administered 2016-02-20: 510 mg via INTRAVENOUS
  Filled 2016-02-20: qty 17

## 2016-02-20 MED ORDER — EPOETIN ALFA 20000 UNIT/ML IJ SOLN
20000.0000 [IU] | INTRAMUSCULAR | Status: DC
  Administered 2016-02-20: 20000 [IU] via SUBCUTANEOUS

## 2016-02-20 MED ORDER — EPOETIN ALFA 20000 UNIT/ML IJ SOLN
INTRAMUSCULAR | Status: AC
  Filled 2016-02-20: qty 1

## 2016-02-24 DIAGNOSIS — R972 Elevated prostate specific antigen [PSA]: Secondary | ICD-10-CM | POA: Diagnosis not present

## 2016-03-02 DIAGNOSIS — R972 Elevated prostate specific antigen [PSA]: Secondary | ICD-10-CM | POA: Diagnosis not present

## 2016-03-02 DIAGNOSIS — R351 Nocturia: Secondary | ICD-10-CM | POA: Diagnosis not present

## 2016-03-04 ENCOUNTER — Encounter (HOSPITAL_COMMUNITY)
Admission: RE | Admit: 2016-03-04 | Discharge: 2016-03-04 | Disposition: A | Discharge status: Home / Self Care | Payer: Medicare Other | Source: Ambulatory Visit | Attending: Nephrology | Admitting: Nephrology

## 2016-03-04 DIAGNOSIS — D631 Anemia in chronic kidney disease: Secondary | ICD-10-CM | POA: Insufficient documentation

## 2016-03-04 DIAGNOSIS — N289 Disorder of kidney and ureter, unspecified: Secondary | ICD-10-CM

## 2016-03-04 DIAGNOSIS — N183 Chronic kidney disease, stage 3 (moderate): Secondary | ICD-10-CM | POA: Diagnosis not present

## 2016-03-04 LAB — IRON AND TIBC
IRON: 80 ug/dL (ref 45–182)
Saturation Ratios: 27 % (ref 17.9–39.5)
TIBC: 301 ug/dL (ref 250–450)
UIBC: 221 ug/dL

## 2016-03-04 LAB — FERRITIN: FERRITIN: 1571 ng/mL — AB (ref 24–336)

## 2016-03-04 LAB — POCT HEMOGLOBIN-HEMACUE: Hemoglobin: 10.3 g/dL — ABNORMAL LOW (ref 13.0–17.0)

## 2016-03-04 MED ORDER — EPOETIN ALFA 20000 UNIT/ML IJ SOLN
INTRAMUSCULAR | Status: AC
  Filled 2016-03-04: qty 1

## 2016-03-04 MED ORDER — EPOETIN ALFA 20000 UNIT/ML IJ SOLN
20000.0000 [IU] | INTRAMUSCULAR | Status: DC
  Administered 2016-03-04: 20000 [IU] via SUBCUTANEOUS

## 2016-03-05 ENCOUNTER — Encounter (HOSPITAL_COMMUNITY): Payer: Medicare Other

## 2016-03-11 DIAGNOSIS — E1122 Type 2 diabetes mellitus with diabetic chronic kidney disease: Secondary | ICD-10-CM | POA: Diagnosis not present

## 2016-03-11 DIAGNOSIS — N183 Chronic kidney disease, stage 3 (moderate): Secondary | ICD-10-CM | POA: Diagnosis not present

## 2016-03-11 DIAGNOSIS — Z0000 Encounter for general adult medical examination without abnormal findings: Secondary | ICD-10-CM | POA: Diagnosis not present

## 2016-03-11 DIAGNOSIS — I129 Hypertensive chronic kidney disease with stage 1 through stage 4 chronic kidney disease, or unspecified chronic kidney disease: Secondary | ICD-10-CM | POA: Diagnosis not present

## 2016-03-11 DIAGNOSIS — E782 Mixed hyperlipidemia: Secondary | ICD-10-CM | POA: Diagnosis not present

## 2016-03-11 DIAGNOSIS — R413 Other amnesia: Secondary | ICD-10-CM | POA: Diagnosis not present

## 2016-03-17 ENCOUNTER — Other Ambulatory Visit (HOSPITAL_COMMUNITY): Payer: Self-pay | Admitting: *Deleted

## 2016-03-18 ENCOUNTER — Encounter (HOSPITAL_COMMUNITY)
Admission: RE | Admit: 2016-03-18 | Discharge: 2016-03-18 | Disposition: A | Discharge status: Home / Self Care | Payer: Medicare Other | Source: Ambulatory Visit | Attending: Nephrology | Admitting: Nephrology

## 2016-03-18 DIAGNOSIS — D631 Anemia in chronic kidney disease: Secondary | ICD-10-CM | POA: Diagnosis not present

## 2016-03-18 DIAGNOSIS — N289 Disorder of kidney and ureter, unspecified: Secondary | ICD-10-CM

## 2016-03-18 DIAGNOSIS — N183 Chronic kidney disease, stage 3 (moderate): Secondary | ICD-10-CM | POA: Diagnosis not present

## 2016-03-18 LAB — POCT HEMOGLOBIN-HEMACUE: HEMOGLOBIN: 10.8 g/dL — AB (ref 13.0–17.0)

## 2016-03-18 MED ORDER — EPOETIN ALFA 20000 UNIT/ML IJ SOLN
20000.0000 [IU] | INTRAMUSCULAR | Status: DC
  Administered 2016-03-18: 20000 [IU] via SUBCUTANEOUS

## 2016-03-18 MED ORDER — EPOETIN ALFA 20000 UNIT/ML IJ SOLN
INTRAMUSCULAR | Status: AC
  Filled 2016-03-18: qty 1

## 2016-03-18 MED ORDER — SODIUM CHLORIDE 0.9 % IV SOLN
510.0000 mg | Freq: Once | INTRAVENOUS | Status: AC
  Administered 2016-03-18: 510 mg via INTRAVENOUS
  Filled 2016-03-18: qty 17

## 2016-04-01 ENCOUNTER — Encounter (HOSPITAL_COMMUNITY)
Admission: RE | Admit: 2016-04-01 | Discharge: 2016-04-01 | Disposition: A | Discharge status: Home / Self Care | Payer: Medicare Other | Source: Ambulatory Visit | Attending: Nephrology | Admitting: Nephrology

## 2016-04-01 DIAGNOSIS — D631 Anemia in chronic kidney disease: Secondary | ICD-10-CM | POA: Diagnosis not present

## 2016-04-01 DIAGNOSIS — N183 Chronic kidney disease, stage 3 (moderate): Secondary | ICD-10-CM | POA: Diagnosis not present

## 2016-04-01 DIAGNOSIS — N289 Disorder of kidney and ureter, unspecified: Secondary | ICD-10-CM

## 2016-04-01 LAB — FERRITIN: Ferritin: 1753 ng/mL — ABNORMAL HIGH (ref 24–336)

## 2016-04-01 LAB — POCT HEMOGLOBIN-HEMACUE: Hemoglobin: 11 g/dL — ABNORMAL LOW (ref 13.0–17.0)

## 2016-04-01 LAB — IRON AND TIBC
IRON: 74 ug/dL (ref 45–182)
Saturation Ratios: 25 % (ref 17.9–39.5)
TIBC: 298 ug/dL (ref 250–450)
UIBC: 224 ug/dL

## 2016-04-01 MED ORDER — EPOETIN ALFA 20000 UNIT/ML IJ SOLN
20000.0000 [IU] | INTRAMUSCULAR | Status: DC
  Administered 2016-04-01: 20000 [IU] via SUBCUTANEOUS

## 2016-04-01 MED ORDER — EPOETIN ALFA 20000 UNIT/ML IJ SOLN
INTRAMUSCULAR | Status: AC
  Filled 2016-04-01: qty 1

## 2016-04-15 ENCOUNTER — Encounter (HOSPITAL_COMMUNITY)
Admission: RE | Admit: 2016-04-15 | Discharge: 2016-04-15 | Disposition: A | Discharge status: Home / Self Care | Payer: Medicare Other | Source: Ambulatory Visit | Attending: Nephrology | Admitting: Nephrology

## 2016-04-15 DIAGNOSIS — N183 Chronic kidney disease, stage 3 (moderate): Secondary | ICD-10-CM | POA: Diagnosis not present

## 2016-04-15 DIAGNOSIS — N289 Disorder of kidney and ureter, unspecified: Secondary | ICD-10-CM

## 2016-04-15 DIAGNOSIS — D631 Anemia in chronic kidney disease: Secondary | ICD-10-CM | POA: Diagnosis not present

## 2016-04-15 LAB — POCT HEMOGLOBIN-HEMACUE: HEMOGLOBIN: 12.2 g/dL — AB (ref 13.0–17.0)

## 2016-04-15 MED ORDER — EPOETIN ALFA 20000 UNIT/ML IJ SOLN
INTRAMUSCULAR | Status: AC
  Filled 2016-04-15: qty 1

## 2016-04-15 MED ORDER — EPOETIN ALFA 20000 UNIT/ML IJ SOLN: 20000.0000 [IU] | INTRAMUSCULAR | Status: DC

## 2016-04-29 ENCOUNTER — Encounter (HOSPITAL_COMMUNITY)
Admission: RE | Admit: 2016-04-29 | Discharge: 2016-04-29 | Disposition: A | Discharge status: Home / Self Care | Payer: Medicare Other | Source: Ambulatory Visit | Attending: Nephrology | Admitting: Nephrology

## 2016-04-29 DIAGNOSIS — N183 Chronic kidney disease, stage 3 (moderate): Secondary | ICD-10-CM | POA: Insufficient documentation

## 2016-04-29 DIAGNOSIS — N289 Disorder of kidney and ureter, unspecified: Secondary | ICD-10-CM

## 2016-04-29 DIAGNOSIS — D631 Anemia in chronic kidney disease: Secondary | ICD-10-CM | POA: Diagnosis not present

## 2016-04-29 LAB — IRON AND TIBC
Iron: 67 ug/dL (ref 45–182)
Saturation Ratios: 23 % (ref 17.9–39.5)
TIBC: 297 ug/dL (ref 250–450)
UIBC: 230 ug/dL

## 2016-04-29 LAB — POCT HEMOGLOBIN-HEMACUE: HEMOGLOBIN: 11.7 g/dL — AB (ref 13.0–17.0)

## 2016-04-29 LAB — FERRITIN: Ferritin: 1481 ng/mL — ABNORMAL HIGH (ref 24–336)

## 2016-04-29 MED ORDER — EPOETIN ALFA 20000 UNIT/ML IJ SOLN
20000.0000 [IU] | INTRAMUSCULAR | Status: DC
  Administered 2016-04-29: 20000 [IU] via SUBCUTANEOUS

## 2016-04-29 MED ORDER — EPOETIN ALFA 20000 UNIT/ML IJ SOLN
INTRAMUSCULAR | Status: AC
  Administered 2016-04-29: 20000 [IU] via SUBCUTANEOUS
  Filled 2016-04-29: qty 1

## 2016-05-12 ENCOUNTER — Other Ambulatory Visit (HOSPITAL_COMMUNITY): Payer: Self-pay | Admitting: *Deleted

## 2016-05-13 ENCOUNTER — Encounter (HOSPITAL_COMMUNITY)
Admission: RE | Admit: 2016-05-13 | Discharge: 2016-05-13 | Disposition: A | Discharge status: Home / Self Care | Payer: Medicare Other | Source: Ambulatory Visit | Attending: Nephrology | Admitting: Nephrology

## 2016-05-13 DIAGNOSIS — N289 Disorder of kidney and ureter, unspecified: Secondary | ICD-10-CM

## 2016-05-13 DIAGNOSIS — D631 Anemia in chronic kidney disease: Secondary | ICD-10-CM | POA: Diagnosis not present

## 2016-05-13 DIAGNOSIS — N183 Chronic kidney disease, stage 3 (moderate): Secondary | ICD-10-CM | POA: Diagnosis not present

## 2016-05-13 LAB — POCT HEMOGLOBIN-HEMACUE: Hemoglobin: 11.4 g/dL — ABNORMAL LOW (ref 13.0–17.0)

## 2016-05-13 MED ORDER — EPOETIN ALFA 20000 UNIT/ML IJ SOLN
20000.0000 [IU] | INTRAMUSCULAR | Status: DC
  Administered 2016-05-13: 20000 [IU] via SUBCUTANEOUS

## 2016-05-13 MED ORDER — EPOETIN ALFA 20000 UNIT/ML IJ SOLN
INTRAMUSCULAR | Status: AC
  Filled 2016-05-13: qty 1

## 2016-05-25 DIAGNOSIS — M549 Dorsalgia, unspecified: Secondary | ICD-10-CM | POA: Diagnosis not present

## 2016-05-25 DIAGNOSIS — F17211 Nicotine dependence, cigarettes, in remission: Secondary | ICD-10-CM | POA: Diagnosis not present

## 2016-05-25 DIAGNOSIS — E1165 Type 2 diabetes mellitus with hyperglycemia: Secondary | ICD-10-CM | POA: Diagnosis not present

## 2016-05-25 DIAGNOSIS — E782 Mixed hyperlipidemia: Secondary | ICD-10-CM | POA: Diagnosis not present

## 2016-05-26 ENCOUNTER — Other Ambulatory Visit: Payer: Self-pay | Admitting: Internal Medicine

## 2016-05-26 ENCOUNTER — Ambulatory Visit
Admission: RE | Admit: 2016-05-26 | Discharge: 2016-05-26 | Disposition: A | Discharge status: Home / Self Care | Payer: Medicare Other | Source: Ambulatory Visit | Attending: Internal Medicine | Admitting: Internal Medicine

## 2016-05-26 DIAGNOSIS — M549 Dorsalgia, unspecified: Secondary | ICD-10-CM

## 2016-05-26 DIAGNOSIS — Z87891 Personal history of nicotine dependence: Secondary | ICD-10-CM

## 2016-05-26 DIAGNOSIS — R079 Chest pain, unspecified: Secondary | ICD-10-CM | POA: Diagnosis not present

## 2016-05-27 ENCOUNTER — Encounter (HOSPITAL_COMMUNITY)
Admission: RE | Admit: 2016-05-27 | Discharge: 2016-05-27 | Disposition: A | Discharge status: Home / Self Care | Payer: Medicare Other | Source: Ambulatory Visit | Attending: Nephrology | Admitting: Nephrology

## 2016-05-27 DIAGNOSIS — N289 Disorder of kidney and ureter, unspecified: Secondary | ICD-10-CM

## 2016-05-27 DIAGNOSIS — N183 Chronic kidney disease, stage 3 (moderate): Secondary | ICD-10-CM | POA: Diagnosis not present

## 2016-05-27 DIAGNOSIS — D631 Anemia in chronic kidney disease: Secondary | ICD-10-CM | POA: Insufficient documentation

## 2016-05-27 LAB — IRON AND TIBC
IRON: 45 ug/dL (ref 45–182)
SATURATION RATIOS: 15 % — AB (ref 17.9–39.5)
TIBC: 298 ug/dL (ref 250–450)
UIBC: 253 ug/dL

## 2016-05-27 LAB — FERRITIN: FERRITIN: 1281 ng/mL — AB (ref 24–336)

## 2016-05-27 LAB — POCT HEMOGLOBIN-HEMACUE: Hemoglobin: 11.2 g/dL — ABNORMAL LOW (ref 13.0–17.0)

## 2016-05-27 MED ORDER — EPOETIN ALFA 20000 UNIT/ML IJ SOLN
INTRAMUSCULAR | Status: AC
  Filled 2016-05-27: qty 1

## 2016-05-27 MED ORDER — EPOETIN ALFA 20000 UNIT/ML IJ SOLN
20000.0000 [IU] | INTRAMUSCULAR | Status: DC
  Administered 2016-05-27: 20000 [IU] via SUBCUTANEOUS

## 2016-05-28 DIAGNOSIS — Z23 Encounter for immunization: Secondary | ICD-10-CM | POA: Diagnosis not present

## 2016-06-02 ENCOUNTER — Emergency Department (HOSPITAL_COMMUNITY): Payer: Medicare Other

## 2016-06-02 ENCOUNTER — Encounter (HOSPITAL_COMMUNITY): Payer: Self-pay | Admitting: Emergency Medicine

## 2016-06-02 ENCOUNTER — Emergency Department (HOSPITAL_COMMUNITY)
Admission: EM | Admit: 2016-06-02 | Discharge: 2016-06-02 | Disposition: A | Discharge status: Home / Self Care | Payer: Medicare Other | Attending: Physician Assistant | Admitting: Physician Assistant

## 2016-06-02 DIAGNOSIS — F17210 Nicotine dependence, cigarettes, uncomplicated: Secondary | ICD-10-CM | POA: Diagnosis not present

## 2016-06-02 DIAGNOSIS — E1165 Type 2 diabetes mellitus with hyperglycemia: Secondary | ICD-10-CM | POA: Diagnosis not present

## 2016-06-02 DIAGNOSIS — Z7982 Long term (current) use of aspirin: Secondary | ICD-10-CM | POA: Insufficient documentation

## 2016-06-02 DIAGNOSIS — I10 Essential (primary) hypertension: Secondary | ICD-10-CM | POA: Diagnosis not present

## 2016-06-02 DIAGNOSIS — Z7984 Long term (current) use of oral hypoglycemic drugs: Secondary | ICD-10-CM | POA: Diagnosis not present

## 2016-06-02 DIAGNOSIS — Z79899 Other long term (current) drug therapy: Secondary | ICD-10-CM | POA: Diagnosis not present

## 2016-06-02 DIAGNOSIS — R739 Hyperglycemia, unspecified: Secondary | ICD-10-CM | POA: Diagnosis not present

## 2016-06-02 HISTORY — DX: Essential (primary) hypertension: I10

## 2016-06-02 HISTORY — DX: Type 2 diabetes mellitus without complications: E11.9

## 2016-06-02 HISTORY — DX: Unspecified cataract: H26.9

## 2016-06-02 HISTORY — DX: Gout, unspecified: M10.9

## 2016-06-02 LAB — COMPREHENSIVE METABOLIC PANEL
ALBUMIN: 3.8 g/dL (ref 3.5–5.0)
ALT: 22 U/L (ref 17–63)
AST: 27 U/L (ref 15–41)
Alkaline Phosphatase: 104 U/L (ref 38–126)
Anion gap: 10 (ref 5–15)
BUN: 21 mg/dL — AB (ref 6–20)
CHLORIDE: 101 mmol/L (ref 101–111)
CO2: 22 mmol/L (ref 22–32)
CREATININE: 1.79 mg/dL — AB (ref 0.61–1.24)
Calcium: 10 mg/dL (ref 8.9–10.3)
GFR calc Af Amer: 39 mL/min — ABNORMAL LOW (ref >60)
GFR, EST NON AFRICAN AMERICAN: 34 mL/min — AB (ref >60)
GLUCOSE: 491 mg/dL — AB (ref 65–99)
Potassium: 4.9 mmol/L (ref 3.5–5.1)
Sodium: 133 mmol/L — ABNORMAL LOW (ref 135–145)
Total Bilirubin: 0.7 mg/dL (ref 0.3–1.2)
Total Protein: 7.3 g/dL (ref 6.5–8.1)

## 2016-06-02 LAB — URINE MICROSCOPIC-ADD ON
BACTERIA UA: NONE SEEN
Squamous Epithelial / LPF: NONE SEEN
WBC, UA: NONE SEEN WBC/hpf (ref 0–5)

## 2016-06-02 LAB — CBC WITH DIFFERENTIAL/PLATELET
BASOS ABS: 0 10*3/uL (ref 0.0–0.1)
Basophils Relative: 1 %
Eosinophils Absolute: 0.1 10*3/uL (ref 0.0–0.7)
Eosinophils Relative: 2 %
HEMATOCRIT: 37.3 % — AB (ref 39.0–52.0)
Hemoglobin: 12.1 g/dL — ABNORMAL LOW (ref 13.0–17.0)
LYMPHS PCT: 30 %
Lymphs Abs: 1.9 10*3/uL (ref 0.7–4.0)
MCH: 23.4 pg — ABNORMAL LOW (ref 26.0–34.0)
MCHC: 32.4 g/dL (ref 30.0–36.0)
MCV: 72.1 fL — ABNORMAL LOW (ref 78.0–100.0)
Monocytes Absolute: 0.6 10*3/uL (ref 0.1–1.0)
Monocytes Relative: 9 %
NEUTROS ABS: 3.8 10*3/uL (ref 1.7–7.7)
Neutrophils Relative %: 59 %
PLATELETS: 190 10*3/uL (ref 150–400)
RBC: 5.17 MIL/uL (ref 4.22–5.81)
RDW: 16.1 % — ABNORMAL HIGH (ref 11.5–15.5)
WBC: 6.4 10*3/uL (ref 4.0–10.5)

## 2016-06-02 LAB — URINALYSIS, ROUTINE W REFLEX MICROSCOPIC
BILIRUBIN URINE: NEGATIVE
HGB URINE DIPSTICK: NEGATIVE
Ketones, ur: NEGATIVE mg/dL
Leukocytes, UA: NEGATIVE
Nitrite: NEGATIVE
Protein, ur: NEGATIVE mg/dL
SPECIFIC GRAVITY, URINE: 1.027 (ref 1.005–1.030)
pH: 6.5 (ref 5.0–8.0)

## 2016-06-02 LAB — I-STAT VENOUS BLOOD GAS, ED
Acid-Base Excess: 1 mmol/L (ref 0.0–2.0)
BICARBONATE: 27.4 mmol/L (ref 20.0–28.0)
O2 SAT: 89 %
PH VEN: 7.375 (ref 7.250–7.430)
TCO2: 29 mmol/L (ref 0–100)
pCO2, Ven: 46.8 mmHg (ref 44.0–60.0)
pO2, Ven: 59 mmHg — ABNORMAL HIGH (ref 32.0–45.0)

## 2016-06-02 LAB — CBG MONITORING, ED
GLUCOSE-CAPILLARY: 479 mg/dL — AB (ref 65–99)
Glucose-Capillary: 498 mg/dL — ABNORMAL HIGH (ref 65–99)

## 2016-06-02 NOTE — ED Notes (Signed)
Duplicate orders for istat venous blood draw.  Already collected

## 2016-06-02 NOTE — ED Provider Notes (Signed)
[facility] DEPT Provider Note   CSN: [id number] Arrival date & time: 06/02/16  0935     History   Chief Complaint Chief Complaint  Patient presents with  . Hyperglycemia    HPI Alec Solis is a 82 y.o. male.  HPI  Patient is a 82 year old male presenting with hyperglycemia. Patient reports that his primary care physician has made multiple changes to his diabetes regimen. He's been very frustrated by this. He feels like it worked better when he was on his original medication. He says that he asked to talk to his primary care physician but they were unable to tell him why they the amde changes. He reports that his sugars have been running high. However he is asymptomatic, no dizziness no nausea no vomiting.  Patient called the primary care physician for an appointment today because her sugars running high and they told him to come here to the emergency department.  Past Medical History:  Diagnosis Date  . Cataract   . Diabetes mellitus without complication ([place])   . Gout   . Hypertension     Patient Active Problem List   Diagnosis Date Noted  . Unspecified deficiency anemia 12/09/2011  . Diabetes mellitus 12/09/2011  . Renal insufficiency 12/09/2011    Past Surgical History:  Procedure Laterality Date  . cataract         Home Medications    Prior to Admission medications   Medication Sig Start Date End Date Taking? Authorizing Provider  allopurinol (ZYLOPRIM) 100 MG tablet Take 100 mg by mouth 2 (two) times daily as needed.   Yes Historical Provider, MD  amLODipine (NORVASC) 5 MG tablet Take 5 mg by mouth daily.   Yes Historical Provider, MD  aspirin 81 MG tablet Take 81 mg by mouth daily.   Yes Historical Provider, MD  calcitRIOL (ROCALTROL) 0.25 MCG capsule Take 0.25 mcg by mouth See admin instructions. Monday Thursday Sunday,  Pt takes every 3rd day   Yes Historical Provider, MD  carvedilol (COREG) 25 MG tablet Take 25 mg by mouth 2 (two) times daily with  a meal.   Yes Historical Provider, MD  clopidogrel (PLAVIX) 75 MG tablet Take 75 mg by mouth daily.   Yes Historical Provider, MD  dapagliflozin propanediol (FARXIGA) 5 MG TABS tablet Take 5 mg by mouth daily.   Yes Historical Provider, MD  linagliptin (TRADJENTA) 5 MG TABS tablet Take 5 mg by mouth daily.   Yes Historical Provider, MD  omeprazole (PRILOSEC) 20 MG capsule Take 20 mg by mouth daily.   Yes Historical Provider, MD  sitaGLIPtan-metformin (JANUMET) 50-1000 MG per tablet Take 1 tablet by mouth 2 (two) times daily with a meal.   Yes Historical Provider, MD  sodium bicarbonate 650 MG tablet Take 1,300 mg by mouth 2 (two) times daily.   Yes Historical Provider, MD  Tamsulosin HCl (FLOMAX) 0.4 MG CAPS Take 0.4 mg by mouth 2 (two) times daily.   Yes Historical Provider, MD    Family History No family history on file.  Social History Social History  Substance Use Topics  . Smoking status: Current Every Day Smoker    Packs/day: 0.25    Years: 50.00    Types: Cigarettes  . Smokeless tobacco: Not on file  . Alcohol use Not on file     Allergies   Review of patient's allergies indicates no known allergies.   Review of Systems Review of Systems  Eyes: Negative for visual disturbance.  Cardiovascular: Negative for chest  pain.  Neurological: Negative for dizziness.  Psychiatric/Behavioral: Negative for agitation.     Physical Exam Updated Vital Signs BP (!) 141/101   Pulse 70   Temp 98.5 F (36.9 C) (Oral)   SpO2 99%   Physical Exam  Constitutional: He is oriented to person, place, and time. He appears well-nourished.  HENT:  Head: Normocephalic.  Eyes: Conjunctivae are normal.  Cardiovascular: Normal rate and regular rhythm.   Pulmonary/Chest: Effort normal and breath sounds normal. No respiratory distress.  Abdominal: Soft. There is no tenderness.  Musculoskeletal: Normal range of motion.  Neurological: He is oriented to person, place, and time.  Skin: Skin is  warm and dry. He is not diaphoretic.  Psychiatric: He has a normal mood and affect. His behavior is normal.     ED Treatments / Results  Labs (all labs ordered are listed, but only abnormal results are displayed) Labs Reviewed  CBC WITH DIFFERENTIAL/PLATELET - Abnormal; Notable for the following:       Result Value   Hemoglobin 12.1 (*)    HCT 37.3 (*)    MCV 72.1 (*)    MCH 23.4 (*)    RDW 16.1 (*)    All other components within normal limits  COMPREHENSIVE METABOLIC PANEL - Abnormal; Notable for the following:    Sodium 133 (*)    Glucose, Bld 491 (*)    BUN 21 (*)    Creatinine, Ser 1.79 (*)    GFR calc non Af Amer 34 (*)    GFR calc Af Amer 39 (*)    All other components within normal limits  URINALYSIS, ROUTINE W REFLEX MICROSCOPIC (NOT AT [facility]) - Abnormal; Notable for the following:    Glucose, UA >1000 (*)    All other components within normal limits  CBG MONITORING, ED - Abnormal; Notable for the following:    Glucose-Capillary 498 (*)    All other components within normal limits  CBG MONITORING, ED - Abnormal; Notable for the following:    Glucose-Capillary 479 (*)    All other components within normal limits  I-STAT VENOUS BLOOD GAS, ED - Abnormal; Notable for the following:    pO2, Ven 59.0 (*)    All other components within normal limits  URINE CULTURE  URINE MICROSCOPIC-ADD ON    EKG  EKG Interpretation None       Radiology Dg Chest 2 View  Result Date: 06/02/2016 CLINICAL DATA:  Hyperglycemia since medication change 2 months ago. History of diabetes and hypertension. Discontinue smoking 1 year ago. EXAM: CHEST  2 VIEW COMPARISON:  PA and lateral chest x-ray of May 26, 2016 FINDINGS: The lungs are adequately inflated. There is no focal infiltrate or pleural effusion. The interstitial markings are coarse. The heart and pulmonary vascularity are normal. The mediastinum is normal in width. There is calcification in the wall of the aortic arch. The bony  thorax exhibits no acute abnormality. IMPRESSION: Mild chronic bronchitic-smoking related changes. No CHF, pneumonia, nor other acute cardiopulmonary abnormality. Electronically Signed   By: David  Martinique M.D.   On: 06/02/2016 11:57    Procedures Procedures (including critical care time)  Medications Ordered in ED Medications - No data to display   Initial Impression / Assessment and Plan / ED Course  I have reviewed the triage vital signs and the nursing notes.  Pertinent labs & imaging results that were available during my care of the patient were reviewed by me and considered in my medical decision making (see chart  for details).  Clinical Course   Alec Solis is a very friendly 82 year old male presenting with hyperglycemia. He's been very frustrated because he was taken off Janumet by his provider and then he has had poor control of sugars. He feels like he's been unable to figure out why it is that he was taken off. He is on a new medication called transient. He is from his sugars running high. Indeed here he's got a sugar of 460s. We looked for evidence of DKA or other infectious ideology for raise blood sugar and found none.  1:59 PM Called Dr. Baird Cancer at [facility]. We will have him follow-up with appointment with her on [place] 2:30. In addition Alec Solis will place Janumet samples for him to pick up today and take until follow-up on Monday. Patient happy with plan.  Patient is comfortable, ambulatory, and taking PO at time of discharge.  Patient expressed understanding about return precautions.    No current facility-administered medications for this encounter.    Current Outpatient Prescriptions  Medication Sig Dispense Refill  . allopurinol (ZYLOPRIM) 100 MG tablet Take 100 mg by mouth 2 (two) times daily as needed.    Marland Kitchen amLODipine (NORVASC) 5 MG tablet Take 5 mg by mouth daily.    Marland Kitchen aspirin 81 MG tablet Take 81 mg by mouth daily.    . calcitRIOL (ROCALTROL) 0.25 MCG capsule Take  0.25 mcg by mouth See admin instructions. Monday Thursday Sunday,  Pt takes every 3rd day    . carvedilol (COREG) 25 MG tablet Take 25 mg by mouth 2 (two) times daily with a meal.    . clopidogrel (PLAVIX) 75 MG tablet Take 75 mg by mouth daily.    . dapagliflozin propanediol (FARXIGA) 5 MG TABS tablet Take 5 mg by mouth daily.    Marland Kitchen linagliptin (TRADJENTA) 5 MG TABS tablet Take 5 mg by mouth daily.    Marland Kitchen omeprazole (PRILOSEC) 20 MG capsule Take 20 mg by mouth daily.    . sitaGLIPtan-metformin (JANUMET) 50-1000 MG per tablet Take 1 tablet by mouth 2 (two) times daily with a meal.    . sodium bicarbonate 650 MG tablet Take 1,300 mg by mouth 2 (two) times daily.    . Tamsulosin HCl (FLOMAX) 0.4 MG CAPS Take 0.4 mg by mouth 2 (two) times daily.        Final Clinical Impressions(s) / ED Diagnoses   Final diagnoses:  None    New Prescriptions New Prescriptions   No medications on file     Cayden Granholm Julio Alm, MD 06/02/16 1400

## 2016-06-02 NOTE — ED Notes (Signed)
CBG 479 

## 2016-06-02 NOTE — ED Notes (Signed)
Papers reviewed and pt. Verbalizes understanding and intent to follow up with MD regarding the new medication.

## 2016-06-02 NOTE — Discharge Instructions (Signed)
You were seen today for high blood sugars. We've talked to her primary care physician and they have samples of Janumet available at the office fo you to pickup and use until the follow up appointment on Monday at 2:30. She wants you to know that he may require use insulin in the future given how high your blood sugars have been on your medications.  Please stop your current medication for diabetes and take the [place].

## 2016-06-02 NOTE — ED Triage Notes (Addendum)
Pt reports they changed meds in August and they have not been holding him. No changes in diet. Urination frequency increased. Denies other symptoms. Sugar today 400's. States he was on insulin but removed to tradjenta and farxiga.

## 2016-06-03 LAB — URINE CULTURE: CULTURE: NO GROWTH

## 2016-06-08 DIAGNOSIS — N183 Chronic kidney disease, stage 3 (moderate): Secondary | ICD-10-CM | POA: Diagnosis not present

## 2016-06-08 DIAGNOSIS — E1122 Type 2 diabetes mellitus with diabetic chronic kidney disease: Secondary | ICD-10-CM | POA: Diagnosis not present

## 2016-06-08 DIAGNOSIS — N08 Glomerular disorders in diseases classified elsewhere: Secondary | ICD-10-CM | POA: Diagnosis not present

## 2016-06-08 DIAGNOSIS — R350 Frequency of micturition: Secondary | ICD-10-CM | POA: Diagnosis not present

## 2016-06-09 ENCOUNTER — Other Ambulatory Visit (HOSPITAL_COMMUNITY): Payer: Self-pay | Admitting: *Deleted

## 2016-06-09 DIAGNOSIS — E1129 Type 2 diabetes mellitus with other diabetic kidney complication: Secondary | ICD-10-CM | POA: Diagnosis not present

## 2016-06-09 DIAGNOSIS — M109 Gout, unspecified: Secondary | ICD-10-CM | POA: Diagnosis not present

## 2016-06-09 DIAGNOSIS — I129 Hypertensive chronic kidney disease with stage 1 through stage 4 chronic kidney disease, or unspecified chronic kidney disease: Secondary | ICD-10-CM | POA: Diagnosis not present

## 2016-06-09 DIAGNOSIS — N2589 Other disorders resulting from impaired renal tubular function: Secondary | ICD-10-CM | POA: Diagnosis not present

## 2016-06-09 DIAGNOSIS — D631 Anemia in chronic kidney disease: Secondary | ICD-10-CM | POA: Diagnosis not present

## 2016-06-09 DIAGNOSIS — N183 Chronic kidney disease, stage 3 (moderate): Secondary | ICD-10-CM | POA: Diagnosis not present

## 2016-06-09 DIAGNOSIS — N390 Urinary tract infection, site not specified: Secondary | ICD-10-CM | POA: Diagnosis not present

## 2016-06-09 DIAGNOSIS — M889 Osteitis deformans of unspecified bone: Secondary | ICD-10-CM | POA: Diagnosis not present

## 2016-06-10 ENCOUNTER — Encounter (HOSPITAL_COMMUNITY)
Admission: RE | Admit: 2016-06-10 | Discharge: 2016-06-10 | Disposition: A | Discharge status: Home / Self Care | Payer: Medicare Other | Source: Ambulatory Visit | Attending: Nephrology | Admitting: Nephrology

## 2016-06-10 DIAGNOSIS — N289 Disorder of kidney and ureter, unspecified: Secondary | ICD-10-CM

## 2016-06-10 LAB — POCT HEMOGLOBIN-HEMACUE: HEMOGLOBIN: 12.1 g/dL — AB (ref 13.0–17.0)

## 2016-06-10 MED ORDER — EPOETIN ALFA 20000 UNIT/ML IJ SOLN: 20000.0000 [IU] | INTRAMUSCULAR | Status: DC

## 2016-06-10 MED ORDER — FERUMOXYTOL INJECTION 510 MG/17 ML
510.0000 mg | Freq: Once | INTRAVENOUS | Status: AC
  Administered 2016-06-10: 510 mg via INTRAVENOUS
  Filled 2016-06-10: qty 17

## 2016-06-11 ENCOUNTER — Encounter (HOSPITAL_COMMUNITY): Payer: Self-pay

## 2016-06-11 ENCOUNTER — Emergency Department (HOSPITAL_COMMUNITY): Payer: Medicare Other

## 2016-06-11 ENCOUNTER — Inpatient Hospital Stay (HOSPITAL_COMMUNITY)
Admission: EM | Admit: 2016-06-11 | Discharge: 2016-06-14 | DRG: 638 | Disposition: A | Discharge status: Home Health | Payer: Medicare Other | Attending: Internal Medicine | Admitting: Internal Medicine

## 2016-06-11 DIAGNOSIS — M5489 Other dorsalgia: Secondary | ICD-10-CM | POA: Diagnosis not present

## 2016-06-11 DIAGNOSIS — K219 Gastro-esophageal reflux disease without esophagitis: Secondary | ICD-10-CM | POA: Diagnosis present

## 2016-06-11 DIAGNOSIS — N183 Chronic kidney disease, stage 3 unspecified: Secondary | ICD-10-CM

## 2016-06-11 DIAGNOSIS — S29012A Strain of muscle and tendon of back wall of thorax, initial encounter: Secondary | ICD-10-CM | POA: Diagnosis not present

## 2016-06-11 DIAGNOSIS — Y92007 Garden or yard of unspecified non-institutional (private) residence as the place of occurrence of the external cause: Secondary | ICD-10-CM

## 2016-06-11 DIAGNOSIS — S199XXA Unspecified injury of neck, initial encounter: Secondary | ICD-10-CM | POA: Diagnosis not present

## 2016-06-11 DIAGNOSIS — Z87891 Personal history of nicotine dependence: Secondary | ICD-10-CM

## 2016-06-11 DIAGNOSIS — Z800 Family history of malignant neoplasm of digestive organs: Secondary | ICD-10-CM

## 2016-06-11 DIAGNOSIS — D539 Nutritional anemia, unspecified: Secondary | ICD-10-CM | POA: Diagnosis not present

## 2016-06-11 DIAGNOSIS — Z7902 Long term (current) use of antithrombotics/antiplatelets: Secondary | ICD-10-CM

## 2016-06-11 DIAGNOSIS — E119 Type 2 diabetes mellitus without complications: Secondary | ICD-10-CM

## 2016-06-11 DIAGNOSIS — Z833 Family history of diabetes mellitus: Secondary | ICD-10-CM

## 2016-06-11 DIAGNOSIS — N179 Acute kidney failure, unspecified: Secondary | ICD-10-CM | POA: Diagnosis not present

## 2016-06-11 DIAGNOSIS — E1165 Type 2 diabetes mellitus with hyperglycemia: Secondary | ICD-10-CM | POA: Diagnosis not present

## 2016-06-11 DIAGNOSIS — R52 Pain, unspecified: Secondary | ICD-10-CM

## 2016-06-11 DIAGNOSIS — W010XXA Fall on same level from slipping, tripping and stumbling without subsequent striking against object, initial encounter: Secondary | ICD-10-CM | POA: Diagnosis present

## 2016-06-11 DIAGNOSIS — S299XXA Unspecified injury of thorax, initial encounter: Secondary | ICD-10-CM | POA: Diagnosis not present

## 2016-06-11 DIAGNOSIS — W19XXXA Unspecified fall, initial encounter: Secondary | ICD-10-CM

## 2016-06-11 DIAGNOSIS — E11649 Type 2 diabetes mellitus with hypoglycemia without coma: Secondary | ICD-10-CM | POA: Diagnosis not present

## 2016-06-11 DIAGNOSIS — R0781 Pleurodynia: Secondary | ICD-10-CM | POA: Diagnosis not present

## 2016-06-11 DIAGNOSIS — Z803 Family history of malignant neoplasm of breast: Secondary | ICD-10-CM

## 2016-06-11 DIAGNOSIS — M546 Pain in thoracic spine: Secondary | ICD-10-CM

## 2016-06-11 DIAGNOSIS — I129 Hypertensive chronic kidney disease with stage 1 through stage 4 chronic kidney disease, or unspecified chronic kidney disease: Secondary | ICD-10-CM | POA: Diagnosis present

## 2016-06-11 DIAGNOSIS — N1832 Chronic kidney disease, stage 3b: Secondary | ICD-10-CM

## 2016-06-11 DIAGNOSIS — N401 Enlarged prostate with lower urinary tract symptoms: Secondary | ICD-10-CM | POA: Diagnosis present

## 2016-06-11 DIAGNOSIS — Z79899 Other long term (current) drug therapy: Secondary | ICD-10-CM

## 2016-06-11 DIAGNOSIS — Z7982 Long term (current) use of aspirin: Secondary | ICD-10-CM

## 2016-06-11 DIAGNOSIS — T148XXA Other injury of unspecified body region, initial encounter: Secondary | ICD-10-CM | POA: Diagnosis not present

## 2016-06-11 DIAGNOSIS — Z794 Long term (current) use of insulin: Secondary | ICD-10-CM

## 2016-06-11 DIAGNOSIS — R739 Hyperglycemia, unspecified: Secondary | ICD-10-CM | POA: Diagnosis not present

## 2016-06-11 DIAGNOSIS — S0990XA Unspecified injury of head, initial encounter: Secondary | ICD-10-CM | POA: Diagnosis not present

## 2016-06-11 DIAGNOSIS — Z86718 Personal history of other venous thrombosis and embolism: Secondary | ICD-10-CM

## 2016-06-11 DIAGNOSIS — R350 Frequency of micturition: Secondary | ICD-10-CM | POA: Diagnosis present

## 2016-06-11 LAB — BASIC METABOLIC PANEL
Anion gap: 11 (ref 5–15)
Anion gap: 11 (ref 5–15)
BUN: 29 mg/dL — ABNORMAL HIGH (ref 6–20)
BUN: 27 mg/dL — AB (ref 6–20)
CHLORIDE: 102 mmol/L (ref 101–111)
CO2: 23 mmol/L (ref 22–32)
CO2: 19 mmol/L — AB (ref 22–32)
CREATININE: 2.2 mg/dL — AB (ref 0.61–1.24)
Calcium: 10.2 mg/dL (ref 8.9–10.3)
Calcium: 9.8 mg/dL (ref 8.9–10.3)
Chloride: 95 mmol/L — ABNORMAL LOW (ref 101–111)
Creatinine, Ser: 2.46 mg/dL — ABNORMAL HIGH (ref 0.61–1.24)
GFR calc Af Amer: 27 mL/min — ABNORMAL LOW (ref >60)
GFR calc non Af Amer: 26 mL/min — ABNORMAL LOW (ref >60)
GFR, EST AFRICAN AMERICAN: 31 mL/min — AB (ref >60)
GFR, EST NON AFRICAN AMERICAN: 23 mL/min — AB (ref >60)
GLUCOSE: 505 mg/dL — AB (ref 65–99)
Glucose, Bld: 318 mg/dL — ABNORMAL HIGH (ref 65–99)
POTASSIUM: 4.9 mmol/L (ref 3.5–5.1)
Potassium: 4.5 mmol/L (ref 3.5–5.1)
Sodium: 129 mmol/L — ABNORMAL LOW (ref 135–145)
Sodium: 132 mmol/L — ABNORMAL LOW (ref 135–145)

## 2016-06-11 LAB — URINALYSIS, ROUTINE W REFLEX MICROSCOPIC
Bilirubin Urine: NEGATIVE
Glucose, UA: >1000 mg/dL — AB
Ketones, ur: NEGATIVE mg/dL
Leukocytes, UA: NEGATIVE
Nitrite: NEGATIVE
PROTEIN: NEGATIVE mg/dL
SPECIFIC GRAVITY, URINE: 1.023 (ref 1.005–1.030)
pH: 6 (ref 5.0–8.0)

## 2016-06-11 LAB — CBC WITH DIFFERENTIAL/PLATELET
Basophils Absolute: 0 10*3/uL (ref 0.0–0.1)
Basophils Relative: 1 %
EOS PCT: 1 %
Eosinophils Absolute: 0.1 10*3/uL (ref 0.0–0.7)
HCT: 38.1 % — ABNORMAL LOW (ref 39.0–52.0)
Hemoglobin: 12.5 g/dL — ABNORMAL LOW (ref 13.0–17.0)
LYMPHS ABS: 1.8 10*3/uL (ref 0.7–4.0)
LYMPHS PCT: 29 %
MCH: 23.2 pg — AB (ref 26.0–34.0)
MCHC: 32.8 g/dL (ref 30.0–36.0)
MCV: 70.8 fL — AB (ref 78.0–100.0)
MONO ABS: 0.4 10*3/uL (ref 0.1–1.0)
Monocytes Relative: 7 %
Neutro Abs: 4 10*3/uL (ref 1.7–7.7)
Neutrophils Relative %: 63 %
PLATELETS: 127 10*3/uL — AB (ref 150–400)
RBC: 5.38 MIL/uL (ref 4.22–5.81)
RDW: 15.2 % (ref 11.5–15.5)
WBC: 6.3 10*3/uL (ref 4.0–10.5)

## 2016-06-11 LAB — URINE MICROSCOPIC-ADD ON

## 2016-06-11 LAB — CBG MONITORING, ED: Glucose-Capillary: 510 mg/dL (ref 65–99)

## 2016-06-11 LAB — TROPONIN I

## 2016-06-11 LAB — GLUCOSE, CAPILLARY: Glucose-Capillary: 263 mg/dL — ABNORMAL HIGH (ref 65–99)

## 2016-06-11 MED ORDER — AMLODIPINE BESYLATE 5 MG PO TABS
5.0000 mg | ORAL_TABLET | Freq: Every day | ORAL | Status: DC
  Administered 2016-06-11 – 2016-06-14 (×4): 5 mg via ORAL
  Filled 2016-06-11 (×4): qty 1

## 2016-06-11 MED ORDER — SODIUM CHLORIDE 0.9 % IV BOLUS (SEPSIS)
1000.0000 mL | Freq: Once | INTRAVENOUS | Status: AC
  Administered 2016-06-11: 1000 mL via INTRAVENOUS

## 2016-06-11 MED ORDER — SODIUM CHLORIDE 0.9 % IV SOLN
INTRAVENOUS | Status: AC
  Administered 2016-06-11: 18:00:00 via INTRAVENOUS

## 2016-06-11 MED ORDER — ACETAMINOPHEN 325 MG PO TABS
650.0000 mg | ORAL_TABLET | Freq: Four times a day (QID) | ORAL | Status: DC | PRN
  Administered 2016-06-11: 650 mg via ORAL
  Filled 2016-06-11: qty 2

## 2016-06-11 MED ORDER — METFORMIN HCL 500 MG PO TABS: 1000.0000 mg | ORAL_TABLET | Freq: Two times a day (BID) | ORAL | Status: DC

## 2016-06-11 MED ORDER — LINAGLIPTIN 5 MG PO TABS
5.0000 mg | ORAL_TABLET | Freq: Every day | ORAL | Status: DC
  Administered 2016-06-12 – 2016-06-14 (×3): 5 mg via ORAL
  Filled 2016-06-11 (×3): qty 1

## 2016-06-11 MED ORDER — ACETAMINOPHEN 325 MG PO TABS
650.0000 mg | ORAL_TABLET | Freq: Once | ORAL | Status: AC
  Administered 2016-06-11: 650 mg via ORAL
  Filled 2016-06-11: qty 2

## 2016-06-11 MED ORDER — SODIUM CHLORIDE 0.9 % IV SOLN
INTRAVENOUS | Status: DC
  Administered 2016-06-11: 23:00:00 via INTRAVENOUS

## 2016-06-11 MED ORDER — INSULIN ASPART 100 UNIT/ML ~~LOC~~ SOLN
0.0000 [IU] | Freq: Every day | SUBCUTANEOUS | Status: DC
  Administered 2016-06-11: 3 [IU] via SUBCUTANEOUS

## 2016-06-11 MED ORDER — ENOXAPARIN SODIUM 30 MG/0.3ML ~~LOC~~ SOLN
30.0000 mg | SUBCUTANEOUS | Status: DC
  Administered 2016-06-11: 30 mg via SUBCUTANEOUS
  Filled 2016-06-11: qty 0.3

## 2016-06-11 MED ORDER — INSULIN ASPART 100 UNIT/ML ~~LOC~~ SOLN
0.0000 [IU] | Freq: Three times a day (TID) | SUBCUTANEOUS | Status: DC
  Administered 2016-06-12: 9 [IU] via SUBCUTANEOUS
  Administered 2016-06-12: 7 [IU] via SUBCUTANEOUS
  Administered 2016-06-12: 1 [IU] via SUBCUTANEOUS
  Administered 2016-06-13: 5 [IU] via SUBCUTANEOUS
  Administered 2016-06-13: 3 [IU] via SUBCUTANEOUS
  Administered 2016-06-13: 7 [IU] via SUBCUTANEOUS
  Administered 2016-06-14: 2 [IU] via SUBCUTANEOUS
  Administered 2016-06-14: 5 [IU] via SUBCUTANEOUS

## 2016-06-11 MED ORDER — CALCITRIOL 0.25 MCG PO CAPS
0.2500 ug | ORAL_CAPSULE | ORAL | Status: DC
  Administered 2016-06-11: 0.25 ug via ORAL
  Filled 2016-06-11: qty 1

## 2016-06-11 MED ORDER — SODIUM BICARBONATE 650 MG PO TABS
1300.0000 mg | ORAL_TABLET | Freq: Two times a day (BID) | ORAL | Status: DC
  Administered 2016-06-11 – 2016-06-14 (×6): 1300 mg via ORAL
  Filled 2016-06-11 (×6): qty 2

## 2016-06-11 MED ORDER — CARVEDILOL 25 MG PO TABS
25.0000 mg | ORAL_TABLET | Freq: Two times a day (BID) | ORAL | Status: DC
  Administered 2016-06-11 – 2016-06-14 (×6): 25 mg via ORAL
  Filled 2016-06-11 (×6): qty 1

## 2016-06-11 MED ORDER — SITAGLIPTIN PHOS-METFORMIN HCL 50-1000 MG PO TABS: 1.0000 | ORAL_TABLET | Freq: Two times a day (BID) | ORAL | Status: DC

## 2016-06-11 MED ORDER — INSULIN ASPART 100 UNIT/ML ~~LOC~~ SOLN: 3.0000 [IU] | Freq: Three times a day (TID) | SUBCUTANEOUS | Status: DC

## 2016-06-11 MED ORDER — INSULIN ASPART 100 UNIT/ML ~~LOC~~ SOLN
10.0000 [IU] | Freq: Once | SUBCUTANEOUS | Status: AC
  Administered 2016-06-11: 10 [IU] via INTRAVENOUS
  Filled 2016-06-11: qty 1

## 2016-06-11 MED ORDER — CLOPIDOGREL BISULFATE 75 MG PO TABS
75.0000 mg | ORAL_TABLET | Freq: Every day | ORAL | Status: DC
  Administered 2016-06-12 – 2016-06-14 (×3): 75 mg via ORAL
  Filled 2016-06-11 (×3): qty 1

## 2016-06-11 MED ORDER — TAMSULOSIN HCL 0.4 MG PO CAPS
0.4000 mg | ORAL_CAPSULE | Freq: Two times a day (BID) | ORAL | Status: DC
  Administered 2016-06-11 – 2016-06-14 (×6): 0.4 mg via ORAL
  Filled 2016-06-11 (×6): qty 1

## 2016-06-11 MED ORDER — ASPIRIN EC 81 MG PO TBEC
81.0000 mg | DELAYED_RELEASE_TABLET | Freq: Every day | ORAL | Status: DC
  Administered 2016-06-12 – 2016-06-14 (×3): 81 mg via ORAL
  Filled 2016-06-11 (×4): qty 1

## 2016-06-11 MED ORDER — ACETAMINOPHEN 650 MG RE SUPP: 650.0000 mg | Freq: Four times a day (QID) | RECTAL | Status: DC | PRN

## 2016-06-11 MED ORDER — SODIUM CHLORIDE 0.9% FLUSH
3.0000 mL | Freq: Two times a day (BID) | INTRAVENOUS | Status: DC
  Administered 2016-06-11 – 2016-06-13 (×3): 3 mL via INTRAVENOUS

## 2016-06-11 MED ORDER — PANTOPRAZOLE SODIUM 40 MG PO TBEC
80.0000 mg | DELAYED_RELEASE_TABLET | Freq: Every day | ORAL | Status: DC
  Administered 2016-06-12 – 2016-06-14 (×3): 80 mg via ORAL
  Filled 2016-06-11 (×3): qty 2

## 2016-06-11 NOTE — ED Notes (Signed)
Report attempted to 6E  

## 2016-06-11 NOTE — ED Notes (Signed)
Report given.

## 2016-06-11 NOTE — H&P (Signed)
History and Physical    Alec Solis [id number] DOB: [birth year] DOA: 06/11/2016  PCP: Maximino Greenland, MD   Patient coming from: Home  Chief Complaint: Mechanical fall and hyperglycemia  HPI: Alec Solis is a 82 y.o. male with medical history significant of diabetes mellitus (currently uncontrolled), anemia and hypertension as well as history of DVT in the past that presents after a mechanical fall at home.  Patient came home from the store and tripped over a railroad tie.  He did attempt to twist his body to avoid hitting his head as he is on blood thinners at home.  He mentions to me that his back hurts significantly and that he came to the hospital to be evaluated.  He is normally very active, walking daily and eating very healthy.  He denies chest pain, chest pressure, denies shortness of breath denies change in vision, headache, denies sustaining trauma anywhere besides back and shoulder.  States recently his blood sugars have gotten significantly out of control secondary to his diabetes medication being changed by someone in his PCP office.  He reports that since that time his blood sugars have been extremely elevated.  Now he is back on Janumet (which he was previously controlled on) and is hopeful that his blood sugars will become controlled again.  Denies recent change in diet or decreased fluid intake.  Just saw nephrologist last week and kidney function was "about normal".  ED Course: Patient underwent CT scan of the head and cervical spine both of which were negative for acute abnormalities.  Chest xray was negative.  Patient was found to be hyperglycemic with blood sugars in the 500s and have acute on chronic renal failure with a creatinine of 2.46 (baseline appears to be around 1.6) Was given 10units of Novolog.  Hospitalist was asked to admit for acute on chronic renal failure and blood sugar control.  Of note Dr. Wyvonnia Dusky did state patient had slight crepitus in chest on exam but as  previously mentioned Xray was negative.  Review of Systems: As per HPI otherwise 10 point review of systems negative.    Past Medical History:  Diagnosis Date  . Cataract   . Diabetes mellitus without complication ([place])   . Gout   . Hypertension     Past Surgical History:  Procedure Laterality Date  . cataract       reports that he has quit smoking. His smoking use included Cigarettes. He has a 12.50 pack-year smoking history. He has never used smokeless tobacco. He reports that he drinks alcohol. He reports that he does not use drugs.  No Known Allergies  Family History  Problem Relation Age of Onset  . Breast cancer Mother   . Stomach cancer Mother   . Diabetes Mellitus II Maternal Aunt      Prior to Admission medications   Medication Sig Start Date End Date Taking? Authorizing Provider  allopurinol (ZYLOPRIM) 100 MG tablet Take 100 mg by mouth 2 (two) times daily.    Yes Historical Provider, MD  aspirin 81 MG tablet Take 81 mg by mouth daily.   Yes Historical Provider, MD  calcitRIOL (ROCALTROL) 0.25 MCG capsule Take 0.25 mcg by mouth See admin instructions. Monday Thursday Sunday,  Pt takes every 3rd day   Yes Historical Provider, MD  carvedilol (COREG) 25 MG tablet Take 25 mg by mouth 2 (two) times daily with a meal.   Yes Historical Provider, MD  clopidogrel (PLAVIX) 75 MG tablet Take 75 mg by mouth  daily.   Yes Historical Provider, MD  FLUZONE HIGH-DOSE 0.5 ML SUSY Inject 1 application into the muscle once. 05/28/16  Yes Historical Provider, MD  omeprazole (PRILOSEC) 40 MG capsule Take 40 mg by mouth daily. 05/25/16  Yes Historical Provider, MD  sitaGLIPtan-metformin (JANUMET) 50-1000 MG per tablet Take 1 tablet by mouth 2 (two) times daily with a meal.   Yes Historical Provider, MD  sodium bicarbonate 650 MG tablet Take 1,300 mg by mouth 2 (two) times daily.   Yes Historical Provider, MD  Tamsulosin HCl (FLOMAX) 0.4 MG CAPS Take 0.4 mg by mouth 2 (two) times daily.    Yes Historical Provider, MD  amLODipine (NORVASC) 5 MG tablet Take 5 mg by mouth daily.    Historical Provider, MD    Physical Exam: Vitals:   06/11/16 1700 06/11/16 1730 06/11/16 1800 06/11/16 1842  BP: 130/86 124/92 127/90 (!) 153/101  Pulse:   79 85  Resp: 17 18 17 16   Temp:      TempSrc:      SpO2:   96% 99%  Weight:      Height:          Constitutional: NAD, calm, comfortable Vitals:   06/11/16 1700 06/11/16 1730 06/11/16 1800 06/11/16 1842  BP: 130/86 124/92 127/90 (!) 153/101  Pulse:   79 85  Resp: 17 18 17 16   Temp:      TempSrc:      SpO2:   96% 99%  Weight:      Height:       Eyes: PERRL, lids and conjunctivae normal, arcus senilus ENMT: Mucous membranes are moist. Posterior pharynx clear of any exudate or lesions. Upper dentures in place  Neck: normal, supple, no masses, no thyromegaly Respiratory: clear to auscultation bilaterally, no wheezing, no crackles. Normal respiratory effort. No accessory muscle use.  Cardiovascular: Regular rate and rhythm but what sounds like immature beats, no murmurs / rubs / gallops. No extremity edema. 2+ pedal pulses. No carotid bruits.  Abdomen: no tenderness, no masses palpated. No hepatosplenomegaly. Bowel sounds positive.  Musculoskeletal: no clubbing / cyanosis. No joint deformity upper and lower extremities but pain with movement of the right upper extremity. no contractures. Normal muscle tone. Pain in thoracic back and lumbar spine Skin: no rashes, lesions, ulcers. No induration Neurologic: CN 2-12 grossly intact. Sensation intact, DTR normal. Strength 5/5 in all 4. No meningismus Psychiatric: Normal judgment and insight. Alert and oriented x 3. Normal mood.   Labs on Admission: I have personally reviewed following labs and imaging studies  CBC:  Recent Labs Lab 06/10/16 1039 06/11/16 1547  WBC  --  6.3  NEUTROABS  --  4.0  HGB 12.1* 12.5*  HCT  --  38.1*  MCV  --  70.8*  PLT  --  [id number]   Basic Metabolic  Panel:  Recent Labs Lab 06/11/16 1547  NA 129*  K 4.9  CL 95*  CO2 23  GLUCOSE 505*  BUN 29*  CREATININE 2.46*  CALCIUM 10.2   GFR: Estimated Creatinine Clearance: 25.8 mL/min (by C-G formula based on SCr of 2.46 mg/dL (H)). Liver Function Tests: No results for input(s): AST, ALT, ALKPHOS, BILITOT, PROT, ALBUMIN in the last 168 hours. No results for input(s): LIPASE, AMYLASE in the last 168 hours. No results for input(s): AMMONIA in the last 168 hours. Coagulation Profile: No results for input(s): INR, PROTIME in the last 168 hours. Cardiac Enzymes:  Recent Labs Lab 06/11/16 1547  TROPONINI <0.03  BNP (last 3 results) No results for input(s): PROBNP in the last 8760 hours. HbA1C: No results for input(s): HGBA1C in the last 72 hours. CBG:  Recent Labs Lab 06/11/16 1525  GLUCAP 510*   Lipid Profile: No results for input(s): CHOL, HDL, LDLCALC, TRIG, CHOLHDL, LDLDIRECT in the last 72 hours. Thyroid Function Tests: No results for input(s): TSH, T4TOTAL, FREET4, T3FREE, THYROIDAB in the last 72 hours. Anemia Panel: No results for input(s): VITAMINB12, FOLATE, FERRITIN, TIBC, IRON, RETICCTPCT in the last 72 hours. Urine analysis:    Component Value Date/Time   COLORURINE YELLOW 06/11/2016 [place] 06/11/2016 1638   LABSPEC 1.023 06/11/2016 1638   LABSPEC 1.020 06/10/2011 1141   PHURINE 6.0 06/11/2016 1638   GLUCOSEU >1000 (A) 06/11/2016 1638   HGBUR TRACE (A) 06/11/2016 1638   BILIRUBINUR NEGATIVE 06/11/2016 1638   BILIRUBINUR Negative 06/10/2011 1141   KETONESUR NEGATIVE 06/11/2016 1638   PROTEINUR NEGATIVE 06/11/2016 1638   NITRITE NEGATIVE 06/11/2016 1638   LEUKOCYTESUR NEGATIVE 06/11/2016 1638   LEUKOCYTESUR Negative 06/10/2011 1141   Sepsis Labs: !!!!!!!!!!!!!!!!!!!!!!!!!!!!!!!!!!!!!!!!!!!! @LABRCNTIP (procalcitonin:4,lacticidven:4) ) Recent Results (from the past 240 hour(s))  Urine culture     Status: None   Collection Time:  06/02/16 11:33 AM  Result Value Ref Range Status   Specimen Description URINE, RANDOM  Final   Special Requests NONE  Final   Culture NO GROWTH  Final   Report Status 06/03/2016 FINAL  Final     Radiological Exams on Admission: Dg Chest 2 View  Result Date: 06/11/2016 CLINICAL DATA:  Fall with left posterior rib pain EXAM: CHEST  2 VIEW COMPARISON:  06/02/2016 FINDINGS: Remote appearing posterior left seventh rib fracture. No acute fracture is noted. No evidence of hemothorax or pneumothorax. Notable asymmetric proximal right humerus osteopenia. No cardiomegaly. Stable aortic tortuosity and upper right mediastinal convexity from tortuous vessels on 2012 chest CT. IMPRESSION: Stable from prior.  No evidence of acute disease or injury. Electronically Signed   By: Monte Fantasia M.D.   On: 06/11/2016 16:22   Ct Head Wo Contrast  Result Date: 06/11/2016 CLINICAL DATA:  Tripped and fell this morning, left posterior rib pain EXAM: CT HEAD WITHOUT CONTRAST CT CERVICAL SPINE WITHOUT CONTRAST TECHNIQUE: Multidetector CT imaging of the head and cervical spine was performed following the standard protocol without intravenous contrast. Multiplanar CT image reconstructions of the cervical spine were also generated. COMPARISON:  None. FINDINGS: CT HEAD FINDINGS Brain: No intracranial hemorrhage, mass effect or midline shift. No acute cortical infarction. No mass lesion is noted on this unenhanced scan. Mild cerebral atrophy. Mild periventricular white matter decreased attenuation probable due to chronic small vessel ischemic changes. Vascular: Atherosclerotic calcifications of carotid siphon. Skull: No skull fracture is noted. Sinuses/Orbits: Unremarkable Other: None CT CERVICAL SPINE FINDINGS Alignment: There is normal alignment. Skull base and vertebrae: No acute fracture or subluxation. Degenerative changes are noted C1-C2 articulation. Anterior spurring noted lower endplate of C2 vertebral body. Mild  anterior spurring upper and lower endplate of C5 vertebral body. Soft tissues and spinal canal: No prevertebral soft tissue swelling. Spinal canal is patent. Central subtle cervical airway is patent. Disc levels: There is disc space flattening with mild anterior mild posterior spurring at C3-C4 level. Disc space flattening with mild anterior and mild posterior spurring at C5-C6 level. The visualized lung apices shows no evidence of pneumothorax. Bilateral emphysematous changes are noted. Other: None IMPRESSION: 1. No acute intracranial abnormality. Mild cerebral atrophy. Mild periventricular white matter decreased attenuation probable  due to chronic small vessel ischemic changes. 2. No cervical spine acute fracture or subluxation. Degenerative changes as described above. Electronically Signed   By: Lahoma Crocker M.D.   On: 06/11/2016 16:32   Ct Cervical Spine Wo Contrast  Result Date: 06/11/2016 CLINICAL DATA:  Tripped and fell this morning, left posterior rib pain EXAM: CT HEAD WITHOUT CONTRAST CT CERVICAL SPINE WITHOUT CONTRAST TECHNIQUE: Multidetector CT imaging of the head and cervical spine was performed following the standard protocol without intravenous contrast. Multiplanar CT image reconstructions of the cervical spine were also generated. COMPARISON:  None. FINDINGS: CT HEAD FINDINGS Brain: No intracranial hemorrhage, mass effect or midline shift. No acute cortical infarction. No mass lesion is noted on this unenhanced scan. Mild cerebral atrophy. Mild periventricular white matter decreased attenuation probable due to chronic small vessel ischemic changes. Vascular: Atherosclerotic calcifications of carotid siphon. Skull: No skull fracture is noted. Sinuses/Orbits: Unremarkable Other: None CT CERVICAL SPINE FINDINGS Alignment: There is normal alignment. Skull base and vertebrae: No acute fracture or subluxation. Degenerative changes are noted C1-C2 articulation. Anterior spurring noted lower endplate of  C2 vertebral body. Mild anterior spurring upper and lower endplate of C5 vertebral body. Soft tissues and spinal canal: No prevertebral soft tissue swelling. Spinal canal is patent. Central subtle cervical airway is patent. Disc levels: There is disc space flattening with mild anterior mild posterior spurring at C3-C4 level. Disc space flattening with mild anterior and mild posterior spurring at C5-C6 level. The visualized lung apices shows no evidence of pneumothorax. Bilateral emphysematous changes are noted. Other: None IMPRESSION: 1. No acute intracranial abnormality. Mild cerebral atrophy. Mild periventricular white matter decreased attenuation probable due to chronic small vessel ischemic changes. 2. No cervical spine acute fracture or subluxation. Degenerative changes as described above. Electronically Signed   By: Lahoma Crocker M.D.   On: 06/11/2016 16:32    EKG: Independently reviewed Sinus rhythm Ventricular premature complex Left anterior fascicular block Abnormal R-wave progression, early transition Left ventricular hypertrophy  Assessment/Plan Active Problems:   Deficiency anemia   Diabetes mellitus ([place])   Acute renal failure (ARF) ([facility])   Hyperglycemia     Hyperglycemia in Diabetes Mellitus (Not DKA) - s/p 10 units Novolog in ED - sensitive scale ISS - HgA1c pending - will hold Janumet given acute renal failure  Acute renal failure - s/p boluses in ED - will continue gentle fluid hydration of 179ml/hr - repeat BMP now and again in am  Anemia - appears around patient's baseline - will expect slight decrease with IVF hydration  Hyponatremia - slightly below normal - likely due to hyperglycemia  Pain from mechanical fall - Tylenol 650mg  PO QID  DVT prophylaxis: Lovenox (for Cr<30) Code Status: Full Family Communication: None Disposition Plan: Likely discharge home tomorrow if blood sugar better controlled and kidney function improves Consults called: None Admission  status: Observation telemetry  Newman Pies MD [facility] Pager [phone]  If 7PM-7AM, please contact night-coverage [email or web address] Password [email or web address]  06/11/2016, 7:15 PM

## 2016-06-11 NOTE — ED Provider Notes (Signed)
[facility] DEPT Provider Note   CSN: [id number] Arrival date & time: 06/11/16  1523   By signing my name below, I, Dolores Hoose, attest that this documentation has been prepared under the direction and in the presence of Ezequiel Essex, MD . Electronically Signed: Dolores Hoose, Scribe. 06/11/2016. 3:24 PM.  History   Chief Complaint Chief Complaint  Patient presents with  . Fall    The history is provided by the patient and the EMS personnel. No language interpreter was used.     HPI Comments:  Alec Solis is a 82 y.o. male brought in by EMS, who presents to the Emergency Department complaining of left sided rib pain s/p fall occurring about an hour ago. EMS states that the pt was coming back from an appointment with his PCP when he tripped on a log, fell and landed on his right side. He states that the jarring motion of the fall leads him to believe he could have broken ribs on his left side. Pt states that he has broken ribs in the past and his current pain is similar to the pain he experienced then. He denies any chest pain, dizziness, SOB, neck pain, syncope, head injury, headache or abdominal pain. Pt is currently compliant with his blood thinner medication (plavix and aspirin). He reports being diagnosed with DM and HTN and was headed to his PCP due to issues with his blood sugar. Pt has recently switched medications to [place].    Past Medical History:  Diagnosis Date  . Cataract   . Diabetes mellitus without complication ([place])   . Gout   . Hypertension     Patient Active Problem List   Diagnosis Date Noted  . Unspecified deficiency anemia 12/09/2011  . Diabetes mellitus 12/09/2011  . Renal insufficiency 12/09/2011    Past Surgical History:  Procedure Laterality Date  . cataract         Home Medications    Prior to Admission medications   Medication Sig Start Date End Date Taking? Authorizing Provider  allopurinol (ZYLOPRIM) 100 MG tablet Take 100 mg  by mouth 2 (two) times daily as needed.    Historical Provider, MD  amLODipine (NORVASC) 5 MG tablet Take 5 mg by mouth daily.    Historical Provider, MD  aspirin 81 MG tablet Take 81 mg by mouth daily.    Historical Provider, MD  calcitRIOL (ROCALTROL) 0.25 MCG capsule Take 0.25 mcg by mouth See admin instructions. Monday Thursday Sunday,  Pt takes every 3rd day    Historical Provider, MD  carvedilol (COREG) 25 MG tablet Take 25 mg by mouth 2 (two) times daily with a meal.    Historical Provider, MD  clopidogrel (PLAVIX) 75 MG tablet Take 75 mg by mouth daily.    Historical Provider, MD  dapagliflozin propanediol (FARXIGA) 5 MG TABS tablet Take 5 mg by mouth daily.    Historical Provider, MD  linagliptin (TRADJENTA) 5 MG TABS tablet Take 5 mg by mouth daily.    Historical Provider, MD  omeprazole (PRILOSEC) 20 MG capsule Take 20 mg by mouth daily.    Historical Provider, MD  sitaGLIPtan-metformin (JANUMET) 50-1000 MG per tablet Take 1 tablet by mouth 2 (two) times daily with a meal.    Historical Provider, MD  sodium bicarbonate 650 MG tablet Take 1,300 mg by mouth 2 (two) times daily.    Historical Provider, MD  Tamsulosin HCl (FLOMAX) 0.4 MG CAPS Take 0.4 mg by mouth 2 (two) times daily.  Historical Provider, MD    Family History No family history on file.  Social History Social History  Substance Use Topics  . Smoking status: Former Smoker    Packs/day: 0.25    Years: 50.00    Types: Cigarettes  . Smokeless tobacco: Never Used  . Alcohol use Yes     Allergies   Review of patient's allergies indicates no known allergies.   Review of Systems Review of Systems 10 Systems reviewed and are negative for acute change except as noted in the HPI.   Physical Exam Updated Vital Signs BP 140/94 (BP Location: Right Arm)   Pulse 77   Temp 98.2 F (36.8 C) (Oral)   Resp 18   Ht 6' (1.829 m)   Wt 176 lb (79.8 kg)   SpO2 100%   BMI 23.87 kg/m   Physical Exam  Constitutional:  He is oriented to person, place, and time. He appears well-developed and well-nourished. No distress.  HENT:  Head: Normocephalic and atraumatic.  Mouth/Throat: Oropharynx is clear and moist. No oropharyngeal exudate.  Eyes: Conjunctivae and EOM are normal. Pupils are equal, round, and reactive to light.  Neck: Normal range of motion. Neck supple.  No meningismus.  Cardiovascular: Normal rate, regular rhythm, normal heart sounds and intact distal pulses.   No murmur heard. Pulmonary/Chest: Effort normal and breath sounds normal. No respiratory distress.  Breath sounds normal bilaterally  Abdominal: Soft. There is no tenderness. There is no rebound and no guarding.  Musculoskeletal: Normal range of motion. He exhibits no edema or tenderness.  No c-spine tenderness. Tenderness with crepitus to left thoracic back. No bruising. No midline T or L spine tenderness. Full ROM of hips without pain.  Neurological: He is alert and oriented to person, place, and time. No cranial nerve deficit. He exhibits normal muscle tone. Coordination normal.   5/5 strength throughout. CN 2-12 intact.Equal grip strength.   Skin: Skin is warm.  Psychiatric: He has a normal mood and affect. His behavior is normal.  Nursing note and vitals reviewed.    ED Treatments / Results  DIAGNOSTIC STUDIES:  Oxygen Saturation is 100% on RA, normal by my interpretation.    COORDINATION OF CARE:  3:39 PM Discussed treatment plan with pt at bedside which included CXR and pt agreed to plan.  Labs (all labs ordered are listed, but only abnormal results are displayed) Labs Reviewed  CBC WITH DIFFERENTIAL/PLATELET - Abnormal; Notable for the following:       Result Value   Hemoglobin 12.5 (*)    HCT 38.1 (*)    MCV 70.8 (*)    MCH 23.2 (*)    Platelets 127 (*)    All other components within normal limits  BASIC METABOLIC PANEL - Abnormal; Notable for the following:    Sodium 129 (*)    Chloride 95 (*)    Glucose, Bld  505 (*)    BUN 29 (*)    Creatinine, Ser 2.46 (*)    GFR calc non Af Amer 23 (*)    GFR calc Af Amer 27 (*)    All other components within normal limits  URINALYSIS, ROUTINE W REFLEX MICROSCOPIC (NOT AT [facility]) - Abnormal; Notable for the following:    Glucose, UA >1000 (*)    Hgb urine dipstick TRACE (*)    All other components within normal limits  URINE MICROSCOPIC-ADD ON - Abnormal; Notable for the following:    Squamous Epithelial / LPF 0-5 (*)    Bacteria,  UA RARE (*)    All other components within normal limits  BASIC METABOLIC PANEL - Abnormal; Notable for the following:    Sodium 132 (*)    CO2 19 (*)    Glucose, Bld 318 (*)    BUN 27 (*)    Creatinine, Ser 2.20 (*)    GFR calc non Af Amer 26 (*)    GFR calc Af Amer 31 (*)    All other components within normal limits  GLUCOSE, CAPILLARY - Abnormal; Notable for the following:    Glucose-Capillary 263 (*)    All other components within normal limits  CBG MONITORING, ED - Abnormal; Notable for the following:    Glucose-Capillary 510 (*)    All other components within normal limits  TROPONIN I  BASIC METABOLIC PANEL  CBC  HEMOGLOBIN A1C    EKG  EKG Interpretation  Date/Time:  Thursday June 11 2016 15:36:23 EDT Ventricular Rate:  76 PR Interval:    QRS Duration: 98 QT Interval:  392 QTC Calculation: 441 R Axis:   -44 Text Interpretation:  Sinus rhythm Ventricular premature complex Left anterior fascicular block Abnormal R-wave progression, early transition Left ventricular hypertrophy Baseline w upsloping ST segment V2 No previous ECGs available Confirmed by Wyvonnia Dusky  MD, Simrin Vegh [phone]) on 06/11/2016 3:41:27 PM       Radiology Dg Chest 2 View  Result Date: 06/11/2016 CLINICAL DATA:  Fall with left posterior rib pain EXAM: CHEST  2 VIEW COMPARISON:  06/02/2016 FINDINGS: Remote appearing posterior left seventh rib fracture. No acute fracture is noted. No evidence of hemothorax or pneumothorax. Notable  asymmetric proximal right humerus osteopenia. No cardiomegaly. Stable aortic tortuosity and upper right mediastinal convexity from tortuous vessels on 2012 chest CT. IMPRESSION: Stable from prior.  No evidence of acute disease or injury. Electronically Signed   By: Monte Fantasia M.D.   On: 06/11/2016 16:22   Ct Head Wo Contrast  Result Date: 06/11/2016 CLINICAL DATA:  Tripped and fell this morning, left posterior rib pain EXAM: CT HEAD WITHOUT CONTRAST CT CERVICAL SPINE WITHOUT CONTRAST TECHNIQUE: Multidetector CT imaging of the head and cervical spine was performed following the standard protocol without intravenous contrast. Multiplanar CT image reconstructions of the cervical spine were also generated. COMPARISON:  None. FINDINGS: CT HEAD FINDINGS Brain: No intracranial hemorrhage, mass effect or midline shift. No acute cortical infarction. No mass lesion is noted on this unenhanced scan. Mild cerebral atrophy. Mild periventricular white matter decreased attenuation probable due to chronic small vessel ischemic changes. Vascular: Atherosclerotic calcifications of carotid siphon. Skull: No skull fracture is noted. Sinuses/Orbits: Unremarkable Other: None CT CERVICAL SPINE FINDINGS Alignment: There is normal alignment. Skull base and vertebrae: No acute fracture or subluxation. Degenerative changes are noted C1-C2 articulation. Anterior spurring noted lower endplate of C2 vertebral body. Mild anterior spurring upper and lower endplate of C5 vertebral body. Soft tissues and spinal canal: No prevertebral soft tissue swelling. Spinal canal is patent. Central subtle cervical airway is patent. Disc levels: There is disc space flattening with mild anterior mild posterior spurring at C3-C4 level. Disc space flattening with mild anterior and mild posterior spurring at C5-C6 level. The visualized lung apices shows no evidence of pneumothorax. Bilateral emphysematous changes are noted. Other: None IMPRESSION: 1. No  acute intracranial abnormality. Mild cerebral atrophy. Mild periventricular white matter decreased attenuation probable due to chronic small vessel ischemic changes. 2. No cervical spine acute fracture or subluxation. Degenerative changes as described above. Electronically Signed   By:  Lahoma Crocker M.D.   On: 06/11/2016 16:32   Ct Cervical Spine Wo Contrast  Result Date: 06/11/2016 CLINICAL DATA:  Tripped and fell this morning, left posterior rib pain EXAM: CT HEAD WITHOUT CONTRAST CT CERVICAL SPINE WITHOUT CONTRAST TECHNIQUE: Multidetector CT imaging of the head and cervical spine was performed following the standard protocol without intravenous contrast. Multiplanar CT image reconstructions of the cervical spine were also generated. COMPARISON:  None. FINDINGS: CT HEAD FINDINGS Brain: No intracranial hemorrhage, mass effect or midline shift. No acute cortical infarction. No mass lesion is noted on this unenhanced scan. Mild cerebral atrophy. Mild periventricular white matter decreased attenuation probable due to chronic small vessel ischemic changes. Vascular: Atherosclerotic calcifications of carotid siphon. Skull: No skull fracture is noted. Sinuses/Orbits: Unremarkable Other: None CT CERVICAL SPINE FINDINGS Alignment: There is normal alignment. Skull base and vertebrae: No acute fracture or subluxation. Degenerative changes are noted C1-C2 articulation. Anterior spurring noted lower endplate of C2 vertebral body. Mild anterior spurring upper and lower endplate of C5 vertebral body. Soft tissues and spinal canal: No prevertebral soft tissue swelling. Spinal canal is patent. Central subtle cervical airway is patent. Disc levels: There is disc space flattening with mild anterior mild posterior spurring at C3-C4 level. Disc space flattening with mild anterior and mild posterior spurring at C5-C6 level. The visualized lung apices shows no evidence of pneumothorax. Bilateral emphysematous changes are noted. Other:  None IMPRESSION: 1. No acute intracranial abnormality. Mild cerebral atrophy. Mild periventricular white matter decreased attenuation probable due to chronic small vessel ischemic changes. 2. No cervical spine acute fracture or subluxation. Degenerative changes as described above. Electronically Signed   By: Lahoma Crocker M.D.   On: 06/11/2016 16:32    Procedures Procedures (including critical care time)  Medications Ordered in ED Medications  sodium chloride 0.9 % bolus 1,000 mL (not administered)     Initial Impression / Assessment and Plan / ED Course  I have reviewed the triage vital signs and the nursing notes.  Pertinent labs & imaging results that were available during my care of the patient were reviewed by me and considered in my medical decision making (see chart for details).  Clinical Course  Mechanical fall with L posterior rib pain.  No LOC.  Hyperglycemic.  EKG with ST elevation in v2 only. No comparision. No chest pain.  Labs show hyperglycemia without DKA. Does show worsening creatinine 2.5 from 1.6. Patient will be hydrated and given insulin.  Traumatic imaging is negative. No rib fractures. CT head negative. CT C spine negative. Troponin negative Plan observation admission for hydration, correction of blood sugars and kidney function. No hypoxia or tachycardia. No acute rib fractures or PTX seen on CXR. Admission d/w Dr. Jearld Shines.  Final Clinical Impressions(s) / ED Diagnoses   Final diagnoses:  Fall, initial encounter  Hyperglycemia  Acute renal failure, unspecified acute renal failure type [facility])    New Prescriptions New Prescriptions   No medications on file   I personally performed the services described in this documentation, which was scribed in my presence. The recorded information has been reviewed and is accurate.     Ezequiel Essex, MD 06/12/16 [phone]

## 2016-06-11 NOTE — ED Triage Notes (Signed)
Pt. Tripped and fell this AM in his front yard. He fell on the R side and has pain on the L. No LOC, Did not his head, on plavix, A/Ox4. Pain is b tween 0-7 and CBG is 510

## 2016-06-12 DIAGNOSIS — M5440 Lumbago with sciatica, unspecified side: Secondary | ICD-10-CM | POA: Diagnosis not present

## 2016-06-12 DIAGNOSIS — E1100 Type 2 diabetes mellitus with hyperosmolarity without nonketotic hyperglycemic-hyperosmolar coma (NKHHC): Secondary | ICD-10-CM

## 2016-06-12 DIAGNOSIS — N179 Acute kidney failure, unspecified: Secondary | ICD-10-CM

## 2016-06-12 DIAGNOSIS — I10 Essential (primary) hypertension: Secondary | ICD-10-CM

## 2016-06-12 LAB — CBC
HCT: 32 % — ABNORMAL LOW (ref 39.0–52.0)
HEMOGLOBIN: 10.6 g/dL — AB (ref 13.0–17.0)
MCH: 23.2 pg — ABNORMAL LOW (ref 26.0–34.0)
MCHC: 33.1 g/dL (ref 30.0–36.0)
MCV: 70.2 fL — ABNORMAL LOW (ref 78.0–100.0)
PLATELETS: 146 10*3/uL — AB (ref 150–400)
RBC: 4.56 MIL/uL (ref 4.22–5.81)
RDW: 15 % (ref 11.5–15.5)
WBC: 6.7 10*3/uL (ref 4.0–10.5)

## 2016-06-12 LAB — GLUCOSE, CAPILLARY
Glucose-Capillary: 324 mg/dL — ABNORMAL HIGH (ref 65–99)
Glucose-Capillary: 377 mg/dL — ABNORMAL HIGH (ref 65–99)
Glucose-Capillary: 123 mg/dL — ABNORMAL HIGH (ref 65–99)
Glucose-Capillary: 136 mg/dL — ABNORMAL HIGH (ref 65–99)

## 2016-06-12 LAB — BASIC METABOLIC PANEL
ANION GAP: 7 (ref 5–15)
BUN: 21 mg/dL — ABNORMAL HIGH (ref 6–20)
CALCIUM: 9.2 mg/dL (ref 8.9–10.3)
CO2: 23 mmol/L (ref 22–32)
CREATININE: 1.76 mg/dL — AB (ref 0.61–1.24)
Chloride: 103 mmol/L (ref 101–111)
GFR calc Af Amer: 40 mL/min — ABNORMAL LOW (ref >60)
GFR, EST NON AFRICAN AMERICAN: 35 mL/min — AB (ref >60)
GLUCOSE: 328 mg/dL — AB (ref 65–99)
Potassium: 4.1 mmol/L (ref 3.5–5.1)
Sodium: 133 mmol/L — ABNORMAL LOW (ref 135–145)

## 2016-06-12 LAB — HEMOGLOBIN A1C
HEMOGLOBIN A1C: 13.8 % — AB (ref 4.8–5.6)
MEAN PLASMA GLUCOSE: 349 mg/dL

## 2016-06-12 MED ORDER — TRAMADOL HCL 50 MG PO TABS
50.0000 mg | ORAL_TABLET | Freq: Four times a day (QID) | ORAL | Status: DC | PRN
  Administered 2016-06-12 – 2016-06-14 (×3): 50 mg via ORAL
  Filled 2016-06-12 (×3): qty 1

## 2016-06-12 MED ORDER — CALCITRIOL 0.25 MCG PO CAPS
0.2500 ug | ORAL_CAPSULE | ORAL | Status: DC
  Administered 2016-06-14: 0.25 ug via ORAL
  Filled 2016-06-12: qty 1

## 2016-06-12 MED ORDER — INSULIN ASPART 100 UNIT/ML ~~LOC~~ SOLN
6.0000 [IU] | Freq: Three times a day (TID) | SUBCUTANEOUS | Status: DC
  Administered 2016-06-12 – 2016-06-14 (×6): 6 [IU] via SUBCUTANEOUS

## 2016-06-12 MED ORDER — INSULIN GLARGINE 100 UNIT/ML ~~LOC~~ SOLN
15.0000 [IU] | Freq: Every day | SUBCUTANEOUS | Status: DC
  Filled 2016-06-12: qty 0.15

## 2016-06-12 MED ORDER — INSULIN STARTER KIT- SYRINGES (ENGLISH)
1.0000 | Freq: Once | Status: AC
  Administered 2016-06-12: 1
  Filled 2016-06-12: qty 1

## 2016-06-12 MED ORDER — METHOCARBAMOL 500 MG PO TABS
750.0000 mg | ORAL_TABLET | Freq: Four times a day (QID) | ORAL | Status: DC | PRN
  Administered 2016-06-12 – 2016-06-14 (×6): 750 mg via ORAL
  Filled 2016-06-12 (×6): qty 2

## 2016-06-12 MED ORDER — ACETAMINOPHEN 500 MG PO TABS
1000.0000 mg | ORAL_TABLET | Freq: Three times a day (TID) | ORAL | Status: DC
  Administered 2016-06-12 – 2016-06-14 (×6): 1000 mg via ORAL
  Filled 2016-06-12 (×6): qty 2

## 2016-06-12 MED ORDER — GLUCERNA SHAKE PO LIQD
237.0000 mL | Freq: Two times a day (BID) | ORAL | Status: DC
  Administered 2016-06-13 – 2016-06-14 (×4): 237 mL via ORAL

## 2016-06-12 MED ORDER — INSULIN GLARGINE 100 UNIT/ML ~~LOC~~ SOLN
20.0000 [IU] | Freq: Every day | SUBCUTANEOUS | Status: DC
  Administered 2016-06-12: 20 [IU] via SUBCUTANEOUS
  Filled 2016-06-12 (×2): qty 0.2

## 2016-06-12 MED ORDER — INSULIN STARTER KIT- PEN NEEDLES (ENGLISH)
1.0000 | Freq: Once | Status: AC
  Administered 2016-06-12: 1
  Filled 2016-06-12: qty 1

## 2016-06-12 MED ORDER — ENOXAPARIN SODIUM 40 MG/0.4ML ~~LOC~~ SOLN
40.0000 mg | Freq: Every day | SUBCUTANEOUS | Status: DC
  Administered 2016-06-12 – 2016-06-13 (×2): 40 mg via SUBCUTANEOUS
  Filled 2016-06-12 (×2): qty 0.4

## 2016-06-12 MED ORDER — ACETAMINOPHEN 325 MG PO TABS: 650.0000 mg | ORAL_TABLET | Freq: Four times a day (QID) | ORAL | Status: DC | PRN

## 2016-06-12 MED ORDER — GLUCERNA SHAKE PO LIQD
237.0000 mL | Freq: Three times a day (TID) | ORAL | Status: DC
  Administered 2016-06-12: 237 mL via ORAL

## 2016-06-12 NOTE — Progress Notes (Signed)
Initial Nutrition Assessment  DOCUMENTATION CODES:   Not applicable  INTERVENTION:  Glucerna Shake po BID, each supplement provides 220 kcal and 10 grams of protein.  Encourage adequate PO intake.  NUTRITION DIAGNOSIS:   Increased nutrient needs related to acute illness as evidenced by estimated needs.  GOAL:   Patient will meet greater than or equal to 90% of their needs  MONITOR:   PO intake, Labs, Weight trends, Supplement acceptance, Skin, I & O's  REASON FOR ASSESSMENT:   Malnutrition Screening Tool    ASSESSMENT:   82 y.o. male with history of diabetes mellitus, renal insufficiency, anemia, HTN, and DVT s/p previous mechanical fall at home. The patient presented to the ED 10/19 after tripping outside his home. He fell and landed on his right side, with no head injury or LOS. His presenting complaint was left-sided back pain. CT of head and cervical spine were negative for acute abnormalities. CXR also negative. Patient was admitted for acute on chronic renal failure with creatinine of 2.46 (baseline 1.6) and blood sugar control (CBG 510). Diabetes has been poorly controlled since medication changes by PCP in August and the ED in October. Urinary frequency is increased.  Pt was unavailable during attempted time of visit. RD unable to obtain nutrition history. Current meal completion has been 75-100%. Pt currently has Glucerna Shake ordered and has been consuming them. RD to modify Glucerna orders to better meet nutrition needs. Unable to complete Nutrition-Focused physical exam at this time. RD to perform at next visit.   Labs and medications reviewed. CBG's 263-377 mg/dL.   Diet Order:  Diet Carb Modified Fluid consistency: Thin; Room service appropriate? Yes  Skin:  Reviewed, no issues  Last BM:  10/18  Height:   Ht Readings from Last 1 Encounters:  06/11/16 6' (1.829 m)    Weight:   Wt Readings from Last 1 Encounters:  06/11/16 187 lb 1.6 oz (84.9 kg)     Ideal Body Weight:  80.9 kg  BMI:  Body mass index is 25.38 kg/m.  Estimated Nutritional Needs:   Kcal:  [id number]  Protein:  80-90 grams  Fluid:  1.9 -2 L/day  EDUCATION NEEDS:   No education needs identified at this time  Corrin Parker, MS, RD, LDN Pager # [phone] After hours/ weekend pager # [phone]

## 2016-06-12 NOTE — Progress Notes (Signed)
PROGRESS NOTE        PATIENT DETAILS Name: Alec Solis Age: 82 y.o. Sex: male Date of Birth: 19-Oct-1932 Admit Date: 06/11/2016 Admitting Physician Wallis Bamberg, MD [id number] N, MD  Brief Narrative: Patient is a 82 y.o. male with history of diabetes mellitus, renal insufficiency, anemia, HTN, and DVT s/p previous mechanical fall at home. The patient presented to the ED 10/19 after tripping outside his home. He fell and landed on his right side, with no head injury or LOS. His presenting complaint was left-sided back pain. CT of head and cervical spine were negative for acute abnormalities. CXR also negative. Patient was admitted for acute on chronic renal failure with creatinine of 2.46 (baseline 1.6) and blood sugar control (CBG 510). Diabetes has been poorly controlled since medication changes by PCP in August and the ED in October. Urinary frequency is increased.  Subjective: Patient is lying still in bed due to back pain. Limited truncal mobility. Increased urinary frequency. Decreased appetite.  Denies anterior chest wall pain, SOB. Denies nausea, vomiting.  Denies low back pain, right sided pain.   Assessment/Plan: Uncontrolled type 2 diabetes with Hyperglycemia: Symptomatic with polyuria and polydipsia-will require insulin on discharge-start Lantus/pre-meal NovoLog. Begin diabetic education/insulin administration education. Await A1c. Will not plan on resuming metformin on discharge.  Acute on chronic kidney disease stage III: likely secondary to prerenal azotemia setting of hyperglycemia. Markedly improved-and almost back to usual baseline. Follow electrolytes  Anemia: Slight decrease in hemoglobin-suspect secondary to IV fluid dilution. Follow CBC   Hyponatremia: likely pseudohyponatremia due to severe hypoglycemia. Improving with correction of hyperglycemia. Follow electrolytes   Back pain from mechanical fall: Pain mostly in the upper  thoracic area-non focal exam.suspect mostly muscular etiology at this time-will see if a trial of Tylenol, muscle relaxant will alleviate his pain. Patient not keen on opiates. Ambulate with physical therapy. If no improvement will consider further imaging.   Hypertension: Controlled, continue Coreg  Other-appears to be on both aspirin and Plavix-denies history of CAD or CVA-patient nor family apparently aware of indication for dual antiplatelets   DVT Prophylaxis: Lovenox  Code Status: Full code  Family Communication: Spouse at bedside  Disposition Plan: Remain inpatient - home 10/21 if improved  Antimicrobial agents: None  Procedures: None  CONSULTS: None  Time spent: 30 minutes  MEDICATIONS: Anti-infectives    None      Scheduled Meds: . acetaminophen  1,000 mg Oral Q8H  . amLODipine  5 mg Oral Daily  . aspirin EC  81 mg Oral Daily  . [START ON 06/14/2016] calcitRIOL  0.25 mcg Oral Q3 days  . carvedilol  25 mg Oral BID WC  . clopidogrel  75 mg Oral Daily  . enoxaparin (LOVENOX) injection  40 mg Subcutaneous QHS  . insulin aspart  0-5 Units Subcutaneous QHS  . insulin aspart  0-9 Units Subcutaneous TID WC  . insulin aspart  6 Units Subcutaneous TID WC  . insulin glargine  20 Units Subcutaneous Daily  . linagliptin  5 mg Oral Daily  . pantoprazole  80 mg Oral Daily  . sodium bicarbonate  1,300 mg Oral BID  . sodium chloride flush  3 mL Intravenous Q12H  . tamsulosin  0.4 mg Oral BID   Continuous Infusions: . sodium chloride 75 mL/hr at 06/11/16 2249   PRN Meds:.methocarbamol, traMADol   PHYSICAL EXAM:  Vital signs: Vitals:   06/11/16 1842 06/11/16 2010 06/12/16 0547 06/12/16 0843  BP: (!) 153/101 (!) 164/83 127/83 139/88  Pulse: 85 (!) 59 78 73  Resp: 16 19 18 18   Temp:  98.6 F (37 C) 98.4 F (36.9 C) 98.4 F (36.9 C)  TempSrc:  Oral Oral Oral  SpO2: 99% 100% 98% 98%  Weight:  84.9 kg (187 lb 1.6 oz)    Height:  6' (1.829 m)     Filed  Weights   06/11/16 1525 06/11/16 2010  Weight: 79.8 kg (176 lb) 84.9 kg (187 lb 1.6 oz)   Body mass index is 25.38 kg/m.   General appearance :Awake, alert, calm. Speech Clear. Not toxic Looking Eyes: pupils equally reactive to light and accomodation,no scleral icterus.Pink conjunctiva HEENT: Atraumatic and Normocephalic Neck: supple, no JVD. No cervical lymphadenopathy. No thyromegaly Resp:Good air entry bilaterally, no added sounds  CVS: S1 S2 regular, no murmurs.  GI: Bowel sounds present, Non tender and not distended with no gaurding, rigidity or rebound.No organomegaly GU: Negative CVA tenderness. Extremities: B/L Lower Ext shows no edema, both legs are warm to touch Neurology:  speech clear,Non focal, sensation is grossly intact. Psychiatric: Normal judgment and insight. Alert and oriented x 3. Normal mood. Musculoskeletal: Tenderness with palpation to left thoracic back. No lumbar spine tenderness. 5/5 lower extremity strength. Negative straight leg raise.  Skin:No Rash, warm and dry Wounds:N/A  I have personally reviewed following labs and imaging studies  LABORATORY DATA: CBC:  Recent Labs Lab 06/10/16 1039 06/11/16 1547 06/12/16 0510  WBC  --  6.3 6.7  NEUTROABS  --  4.0  --   HGB 12.1* 12.5* 10.6*  HCT  --  38.1* 32.0*  MCV  --  70.8* 70.2*  PLT  --  127* 146*    Basic Metabolic Panel:  Recent Labs Lab 06/11/16 1547 06/11/16 1954 06/12/16 0510  NA 129* 132* 133*  K 4.9 4.5 4.1  CL 95* 102 103  CO2 23 19* 23  GLUCOSE 505* 318* 328*  BUN 29* 27* 21*  CREATININE 2.46* 2.20* 1.76*  CALCIUM 10.2 9.8 9.2    GFR: Estimated Creatinine Clearance: 36.1 mL/min (by C-G formula based on SCr of 1.76 mg/dL (H)).  Liver Function Tests: No results for input(s): AST, ALT, ALKPHOS, BILITOT, PROT, ALBUMIN in the last 168 hours. No results for input(s): LIPASE, AMYLASE in the last 168 hours. No results for input(s): AMMONIA in the last 168 hours.  Coagulation  Profile: No results for input(s): INR, PROTIME in the last 168 hours.  Cardiac Enzymes:  Recent Labs Lab 06/11/16 1547  TROPONINI <0.03    BNP (last 3 results) No results for input(s): PROBNP in the last 8760 hours.  HbA1C: No results for input(s): HGBA1C in the last 72 hours.  CBG:  Recent Labs Lab 06/11/16 1525 06/11/16 2056 06/12/16 0739  GLUCAP 510* 263* 324*    Lipid Profile: No results for input(s): CHOL, HDL, LDLCALC, TRIG, CHOLHDL, LDLDIRECT in the last 72 hours.  Thyroid Function Tests: No results for input(s): TSH, T4TOTAL, FREET4, T3FREE, THYROIDAB in the last 72 hours.  Anemia Panel: No results for input(s): VITAMINB12, FOLATE, FERRITIN, TIBC, IRON, RETICCTPCT in the last 72 hours.  Urine analysis:    Component Value Date/Time   COLORURINE YELLOW 06/11/2016 [place] 06/11/2016 1638   LABSPEC 1.023 06/11/2016 1638   LABSPEC 1.020 06/10/2011 1141   PHURINE 6.0 06/11/2016 1638   GLUCOSEU >1000 (A) 06/11/2016 1638  HGBUR TRACE (A) 06/11/2016 1638   BILIRUBINUR NEGATIVE 06/11/2016 1638   BILIRUBINUR Negative 06/10/2011 1141   [place] 06/11/2016 1638   PROTEINUR NEGATIVE 06/11/2016 1638   NITRITE NEGATIVE 06/11/2016 1638   LEUKOCYTESUR NEGATIVE 06/11/2016 1638   LEUKOCYTESUR Negative 06/10/2011 1141    Sepsis Labs: Lactic Acid, Venous No results found for: LATICACIDVEN  MICROBIOLOGY: Recent Results (from the past 240 hour(s))  Urine culture     Status: None   Collection Time: 06/02/16 11:33 AM  Result Value Ref Range Status   Specimen Description URINE, RANDOM  Final   Special Requests NONE  Final   Culture NO GROWTH  Final   Report Status 06/03/2016 FINAL  Final    RADIOLOGY STUDIES/RESULTS: Dg Chest 2 View  Result Date: 06/11/2016 CLINICAL DATA:  Fall with left posterior rib pain EXAM: CHEST  2 VIEW COMPARISON:  06/02/2016 FINDINGS: Remote appearing posterior left seventh rib fracture. No acute fracture is  noted. No evidence of hemothorax or pneumothorax. Notable asymmetric proximal right humerus osteopenia. No cardiomegaly. Stable aortic tortuosity and upper right mediastinal convexity from tortuous vessels on 2012 chest CT. IMPRESSION: Stable from prior.  No evidence of acute disease or injury. Electronically Signed   By: Monte Fantasia M.D.   On: 06/11/2016 16:22   Dg Chest 2 View  Result Date: 06/02/2016 CLINICAL DATA:  Hyperglycemia since medication change 2 months ago. History of diabetes and hypertension. Discontinue smoking 1 year ago. EXAM: CHEST  2 VIEW COMPARISON:  PA and lateral chest x-ray of May 26, 2016 FINDINGS: The lungs are adequately inflated. There is no focal infiltrate or pleural effusion. The interstitial markings are coarse. The heart and pulmonary vascularity are normal. The mediastinum is normal in width. There is calcification in the wall of the aortic arch. The bony thorax exhibits no acute abnormality. IMPRESSION: Mild chronic bronchitic-smoking related changes. No CHF, pneumonia, nor other acute cardiopulmonary abnormality. Electronically Signed   By: David  Martinique M.D.   On: 06/02/2016 11:57   Dg Chest 2 View  Result Date: 05/26/2016 CLINICAL DATA:  Chronic posterior right chest pain for years. EXAM: CHEST  2 VIEW COMPARISON:  Chest CT 06/26/2011 FINDINGS: Heart and mediastinal contours are within normal limits. No focal opacities or effusions. No acute bony abnormality. IMPRESSION: No active cardiopulmonary disease. Electronically Signed   By: Rolm Baptise M.D.   On: 05/26/2016 13:25   Ct Head Wo Contrast  Result Date: 06/11/2016 CLINICAL DATA:  Tripped and fell this morning, left posterior rib pain EXAM: CT HEAD WITHOUT CONTRAST CT CERVICAL SPINE WITHOUT CONTRAST TECHNIQUE: Multidetector CT imaging of the head and cervical spine was performed following the standard protocol without intravenous contrast. Multiplanar CT image reconstructions of the cervical spine were  also generated. COMPARISON:  None. FINDINGS: CT HEAD FINDINGS Brain: No intracranial hemorrhage, mass effect or midline shift. No acute cortical infarction. No mass lesion is noted on this unenhanced scan. Mild cerebral atrophy. Mild periventricular white matter decreased attenuation probable due to chronic small vessel ischemic changes. Vascular: Atherosclerotic calcifications of carotid siphon. Skull: No skull fracture is noted. Sinuses/Orbits: Unremarkable Other: None CT CERVICAL SPINE FINDINGS Alignment: There is normal alignment. Skull base and vertebrae: No acute fracture or subluxation. Degenerative changes are noted C1-C2 articulation. Anterior spurring noted lower endplate of C2 vertebral body. Mild anterior spurring upper and lower endplate of C5 vertebral body. Soft tissues and spinal canal: No prevertebral soft tissue swelling. Spinal canal is patent. Central subtle cervical airway is patent. Disc  levels: There is disc space flattening with mild anterior mild posterior spurring at C3-C4 level. Disc space flattening with mild anterior and mild posterior spurring at C5-C6 level. The visualized lung apices shows no evidence of pneumothorax. Bilateral emphysematous changes are noted. Other: None IMPRESSION: 1. No acute intracranial abnormality. Mild cerebral atrophy. Mild periventricular white matter decreased attenuation probable due to chronic small vessel ischemic changes. 2. No cervical spine acute fracture or subluxation. Degenerative changes as described above. Electronically Signed   By: Lahoma Crocker M.D.   On: 06/11/2016 16:32   Ct Cervical Spine Wo Contrast  Result Date: 06/11/2016 CLINICAL DATA:  Tripped and fell this morning, left posterior rib pain EXAM: CT HEAD WITHOUT CONTRAST CT CERVICAL SPINE WITHOUT CONTRAST TECHNIQUE: Multidetector CT imaging of the head and cervical spine was performed following the standard protocol without intravenous contrast. Multiplanar CT image reconstructions of  the cervical spine were also generated. COMPARISON:  None. FINDINGS: CT HEAD FINDINGS Brain: No intracranial hemorrhage, mass effect or midline shift. No acute cortical infarction. No mass lesion is noted on this unenhanced scan. Mild cerebral atrophy. Mild periventricular white matter decreased attenuation probable due to chronic small vessel ischemic changes. Vascular: Atherosclerotic calcifications of carotid siphon. Skull: No skull fracture is noted. Sinuses/Orbits: Unremarkable Other: None CT CERVICAL SPINE FINDINGS Alignment: There is normal alignment. Skull base and vertebrae: No acute fracture or subluxation. Degenerative changes are noted C1-C2 articulation. Anterior spurring noted lower endplate of C2 vertebral body. Mild anterior spurring upper and lower endplate of C5 vertebral body. Soft tissues and spinal canal: No prevertebral soft tissue swelling. Spinal canal is patent. Central subtle cervical airway is patent. Disc levels: There is disc space flattening with mild anterior mild posterior spurring at C3-C4 level. Disc space flattening with mild anterior and mild posterior spurring at C5-C6 level. The visualized lung apices shows no evidence of pneumothorax. Bilateral emphysematous changes are noted. Other: None IMPRESSION: 1. No acute intracranial abnormality. Mild cerebral atrophy. Mild periventricular white matter decreased attenuation probable due to chronic small vessel ischemic changes. 2. No cervical spine acute fracture or subluxation. Degenerative changes as described above. Electronically Signed   By: Lahoma Crocker M.D.   On: 06/11/2016 16:32     LOS: 0 days   Filiberto Pinks, PA-S  [facility] Pager:[phone]  If 7PM-7AM, please contact night-coverage [email or web address] Password [facility] 06/12/2016, 11:06 AM  Attending MD note  Patient was seen, examined,treatment plan was discussed with the PA-S.  I have personally reviewed the clinical findings, lab, imaging studies and  management of this patient in detail. I agree with the documentation, as recorded by the PA-S.   Patient presenting with a mechanical fall-following which she was found to have severe hyperglycemia. He does endorse symptoms of polyuria and polydipsia. These have been ongoing for at least a month.  His main complaint today is upper back pain-that is worse on minimal movements.  On Exam: Gen. exam: Awake, alert, not in any distress Chest: Good air entry bilaterally, no rhonchi or rales CVS: S1-S2 regular, no murmurs Abdomen: Soft, nontender and nondistended Neurology: Non-focal Skin: No rash or lesions  Impression Uncontrolled type 2 diabetes with hyperglycemia-patient is symptomatic with polyuria and polydipsia Upper back pain  Plan Start Lantus/pre-meal NovoLog-await A1c Attempted to ambulate with physical therapy Both spouse and patient are not sure why he is on dual antiplatelets  Rest as above  [facility]

## 2016-06-12 NOTE — Progress Notes (Signed)
Inpatient Diabetes Program Recommendations  AACE/ADA: New Consensus Statement on Inpatient Glycemic Control (2015)  Target Ranges:  Prepandial:   less than 140 mg/dL      Peak postprandial:   less than 180 mg/dL (1-2 hours)      Critically ill patients:  140 - 180 mg/dL   Lab Results  Component Value Date   GLUCAP 377 (H) 06/12/2016    Review of Glycemic ControlResults for CLAIR, DIAB (MRN [id number]) as of 06/12/2016 17:06  Ref. Range 06/02/2016 10:39 06/11/2016 15:25 06/11/2016 20:56 06/12/2016 07:39 06/12/2016 11:35  Glucose-Capillary Latest Ref Range: 65 - 99 mg/dL 479 (H) 510 (HH) 263 (H) 324 (H) 377 (H)    Diabetes history: Type 2 diabetes/ Diagnosed in 2000. Outpatient Diabetes medications: Janumet 50-1000 bid Current orders for Inpatient glycemic control:  Novolog sensitive tid with meals and HS, Novolog 6 units tid with meals, Lantus 20 units daily  Inpatient Diabetes Program Recommendations:    Discussed need for insulin.  He states he was on insulin in the past from 2000-2005 and then was switched to PO meds.  He states that CBG's were in the 90-140's however they have gotten worse since being taken off Metformin?  He is familiar with insulin administration.  Discussed hypoglycemia signs, symptoms and treatment.  Discussed goal blood sugars 100-180 and the importance of avoidance of hypoglycemia.    Thanks, Adah Perl, RN, BC-ADM Inpatient Diabetes Coordinator Pager [phone] (8a-5p)

## 2016-06-12 NOTE — Care Management Obs Status (Signed)
[facility] NOTIFICATION   Patient Details  Name: Alec Solis MRN: [id number] Date of Birth: 1933-08-17   Medicare Observation Status Notification Given:  Yes    Wahid Holley, Rory Percy, RN 06/12/2016, 1:20 PM

## 2016-06-12 NOTE — Evaluation (Signed)
Physical Therapy Evaluation Patient Details Name: Alec Solis MRN: [id number] DOB: July 12, 1933 Today's Date: 06/12/2016   History of Present Illness  Patient is a 82 y.o. male with history of diabetes mellitus, renal insufficiency, anemia, HTN, and DVT s/p previous mechanical fall at home. The patient presented to the ED 10/19 after tripping outside his home. He fell and landed on his right side, with no head injury or LOS. His presenting complaint was left-sided back pain. CT of head and cervical spine were negative for acute abnormalities. CXR also negative.  Clinical Impression  Pt admitted with above diagnosis. Pt currently with functional limitations due to the deficits listed below (see PT Problem List).  Pt will benefit from skilled PT to increase their independence and safety with mobility to allow discharge to the venue listed below.  Pt reports he normally walks a mile a day and currently limited by pain with sit <> stand.  He required RW for gait and needing to stairs with B rails and step to pattern.  Recommend RW, 3-1 BSC, and family also interested in tub transfer bench (depending on coverage).  Recommend HHPT as well to work towards returning to high PLOF.     Follow Up Recommendations Home health PT    Equipment Recommendations  Rolling walker with 5" wheels;3in1 (PT);Other (comment) (family interested in tub transfer bench)    Recommendations for Other Services       Precautions / Restrictions Precautions Precautions: Fall Restrictions Weight Bearing Restrictions: No      Mobility  Bed Mobility               General bed mobility comments: Pt up in recliner upon arrival  Transfers Overall transfer level: Needs assistance Equipment used: Rolling walker (2 wheeled) Transfers: Sit to/from Stand Sit to Stand: Min assist         General transfer comment: MIN A to power up and for hand placement with standing and to sit. Sat on very front edge of chair and  had to sit there several minutes before feeling he could scoot back.  Ambulation/Gait Ambulation/Gait assistance: Min guard Ambulation Distance (Feet): 340 Feet Assistive device: Rolling walker (2 wheeled) Gait Pattern/deviations: Step-through pattern;Trunk flexed     General Gait Details: Cues for posture. Reliance on RW for pain management  Stairs Stairs: Yes Stairs assistance: Min guard Stair Management: Two rails;Step to pattern;Forwards;Backwards Number of Stairs: 2 General stair comments: Pt did do backwards descent due to IV pole.  Some pain at end of stairs, but subsided and pt able to walk back to room.  Wheelchair Mobility    Modified Rankin (Stroke Patients Only)       Balance Overall balance assessment: History of Falls;Needs assistance   Sitting balance-Leahy Scale: Good       Standing balance-Leahy Scale: Fair Standing balance comment: Able to stand briefly without support                             Pertinent Vitals/Pain Pain Assessment: Faces Faces Pain Scale: Hurts whole lot Pain Location: L thoracic grabbing pain at times Pain Descriptors / Indicators: Grimacing Pain Intervention(s): RN gave pain meds during session    Home Living Family/patient expects to be discharged to:: Private residence Living Arrangements: Spouse/significant other Available Help at Discharge: Family;Available 24 hours/day Type of Home: House Home Access: Stairs to enter Entrance Stairs-Rails: Right;Left;Can reach both Entrance Stairs-Number of Steps: 3 Home Layout: One level Home  Equipment: Kasandra Knudsen - single point      Prior Function                 Hand Dominance        Extremity/Trunk Assessment   Upper Extremity Assessment: Overall WFL for tasks assessed           Lower Extremity Assessment: Overall WFL for tasks assessed      Cervical / Trunk Assessment: Normal  Communication   Communication: No difficulties  Cognition  Arousal/Alertness: Awake/alert Behavior During Therapy: WFL for tasks assessed/performed Overall Cognitive Status: Within Functional Limits for tasks assessed                      General Comments General comments (skin integrity, edema, etc.): Educated pt and wife on proper bed mobility to decrease strain    Exercises     Assessment/Plan    PT Assessment Patient needs continued PT services  PT Problem List Decreased activity tolerance;Decreased balance;Decreased mobility;Pain;Decreased knowledge of use of DME          PT Treatment Interventions DME instruction;Gait training;Stair training;Functional mobility training;Therapeutic activities;Therapeutic exercise;Patient/family education    PT Goals (Current goals can be found in the Care Plan section)  Acute Rehab PT Goals Patient Stated Goal: Return to walking a mile/day PT Goal Formulation: With patient Time For Goal Achievement: 06/19/16 Potential to Achieve Goals: Good    Frequency Min 3X/week   Barriers to discharge        Co-evaluation               End of Session Equipment Utilized During Treatment: Gait belt Activity Tolerance: Patient limited by pain Patient left: in chair;with call bell/phone within reach;with family/visitor present;Other (comment) (with lunch) Nurse Communication: Mobility status    Functional Assessment Tool Used: clinical judgement and objective findings Functional Limitation: Changing and maintaining body position Changing and Maintaining Body Position Current Status [id number]): At least 1 percent but less than 20 percent impaired, limited or restricted Changing and Maintaining Body Position Goal Status [id number]): At least 1 percent but less than 20 percent impaired, limited or restricted    Time: 1322-1349 PT Time Calculation (min) (ACUTE ONLY): 27 min   Charges:   PT Evaluation $PT Eval Low Complexity: 1 Procedure PT Treatments $Gait Training: 8-22 mins   PT G Codes:    PT G-Codes **NOT FOR INPATIENT CLASS** Functional Assessment Tool Used: clinical judgement and objective findings Functional Limitation: Changing and maintaining body position Changing and Maintaining Body Position Current Status [id number]): At least 1 percent but less than 20 percent impaired, limited or restricted Changing and Maintaining Body Position Goal Status [id number]): At least 1 percent but less than 20 percent impaired, limited or restricted    [facility]UB[facility] 06/12/2016, 2:03 PM

## 2016-06-13 ENCOUNTER — Observation Stay (HOSPITAL_COMMUNITY): Payer: Medicare Other

## 2016-06-13 DIAGNOSIS — W010XXA Fall on same level from slipping, tripping and stumbling without subsequent striking against object, initial encounter: Secondary | ICD-10-CM | POA: Diagnosis present

## 2016-06-13 DIAGNOSIS — M5440 Lumbago with sciatica, unspecified side: Secondary | ICD-10-CM | POA: Diagnosis not present

## 2016-06-13 DIAGNOSIS — Y92007 Garden or yard of unspecified non-institutional (private) residence as the place of occurrence of the external cause: Secondary | ICD-10-CM | POA: Diagnosis not present

## 2016-06-13 DIAGNOSIS — Z800 Family history of malignant neoplasm of digestive organs: Secondary | ICD-10-CM | POA: Diagnosis not present

## 2016-06-13 DIAGNOSIS — Z86718 Personal history of other venous thrombosis and embolism: Secondary | ICD-10-CM | POA: Diagnosis not present

## 2016-06-13 DIAGNOSIS — K219 Gastro-esophageal reflux disease without esophagitis: Secondary | ICD-10-CM | POA: Diagnosis present

## 2016-06-13 DIAGNOSIS — N401 Enlarged prostate with lower urinary tract symptoms: Secondary | ICD-10-CM | POA: Diagnosis present

## 2016-06-13 DIAGNOSIS — I10 Essential (primary) hypertension: Secondary | ICD-10-CM | POA: Diagnosis not present

## 2016-06-13 DIAGNOSIS — N183 Chronic kidney disease, stage 3 (moderate): Secondary | ICD-10-CM | POA: Diagnosis present

## 2016-06-13 DIAGNOSIS — E1165 Type 2 diabetes mellitus with hyperglycemia: Secondary | ICD-10-CM | POA: Diagnosis present

## 2016-06-13 DIAGNOSIS — E0800 Diabetes mellitus due to underlying condition with hyperosmolarity without nonketotic hyperglycemic-hyperosmolar coma (NKHHC): Secondary | ICD-10-CM | POA: Diagnosis not present

## 2016-06-13 DIAGNOSIS — R739 Hyperglycemia, unspecified: Secondary | ICD-10-CM

## 2016-06-13 DIAGNOSIS — D539 Nutritional anemia, unspecified: Secondary | ICD-10-CM | POA: Diagnosis present

## 2016-06-13 DIAGNOSIS — I129 Hypertensive chronic kidney disease with stage 1 through stage 4 chronic kidney disease, or unspecified chronic kidney disease: Secondary | ICD-10-CM | POA: Diagnosis present

## 2016-06-13 DIAGNOSIS — N179 Acute kidney failure, unspecified: Secondary | ICD-10-CM | POA: Diagnosis not present

## 2016-06-13 DIAGNOSIS — M546 Pain in thoracic spine: Secondary | ICD-10-CM | POA: Diagnosis not present

## 2016-06-13 DIAGNOSIS — Z794 Long term (current) use of insulin: Secondary | ICD-10-CM | POA: Diagnosis not present

## 2016-06-13 DIAGNOSIS — S299XXA Unspecified injury of thorax, initial encounter: Secondary | ICD-10-CM | POA: Diagnosis not present

## 2016-06-13 DIAGNOSIS — Z87891 Personal history of nicotine dependence: Secondary | ICD-10-CM | POA: Diagnosis not present

## 2016-06-13 DIAGNOSIS — Z833 Family history of diabetes mellitus: Secondary | ICD-10-CM | POA: Diagnosis not present

## 2016-06-13 DIAGNOSIS — Z7902 Long term (current) use of antithrombotics/antiplatelets: Secondary | ICD-10-CM | POA: Diagnosis not present

## 2016-06-13 DIAGNOSIS — N400 Enlarged prostate without lower urinary tract symptoms: Secondary | ICD-10-CM | POA: Diagnosis not present

## 2016-06-13 DIAGNOSIS — S29012A Strain of muscle and tendon of back wall of thorax, initial encounter: Secondary | ICD-10-CM | POA: Diagnosis present

## 2016-06-13 DIAGNOSIS — E11649 Type 2 diabetes mellitus with hypoglycemia without coma: Secondary | ICD-10-CM | POA: Diagnosis not present

## 2016-06-13 DIAGNOSIS — Z803 Family history of malignant neoplasm of breast: Secondary | ICD-10-CM | POA: Diagnosis not present

## 2016-06-13 DIAGNOSIS — R350 Frequency of micturition: Secondary | ICD-10-CM | POA: Diagnosis present

## 2016-06-13 DIAGNOSIS — Z79899 Other long term (current) drug therapy: Secondary | ICD-10-CM | POA: Diagnosis not present

## 2016-06-13 DIAGNOSIS — Z7982 Long term (current) use of aspirin: Secondary | ICD-10-CM | POA: Diagnosis not present

## 2016-06-13 LAB — BASIC METABOLIC PANEL
ANION GAP: 7 (ref 5–15)
BUN: 17 mg/dL (ref 6–20)
CHLORIDE: 104 mmol/L (ref 101–111)
CO2: 23 mmol/L (ref 22–32)
CREATININE: 1.65 mg/dL — AB (ref 0.61–1.24)
Calcium: 9.2 mg/dL (ref 8.9–10.3)
GFR calc non Af Amer: 37 mL/min — ABNORMAL LOW (ref >60)
GFR, EST AFRICAN AMERICAN: 43 mL/min — AB (ref >60)
Glucose, Bld: 191 mg/dL — ABNORMAL HIGH (ref 65–99)
POTASSIUM: 3.6 mmol/L (ref 3.5–5.1)
SODIUM: 134 mmol/L — AB (ref 135–145)

## 2016-06-13 LAB — GLUCOSE, CAPILLARY
GLUCOSE-CAPILLARY: 214 mg/dL — AB (ref 65–99)
GLUCOSE-CAPILLARY: 340 mg/dL — AB (ref 65–99)
GLUCOSE-CAPILLARY: 287 mg/dL — AB (ref 65–99)
GLUCOSE-CAPILLARY: 168 mg/dL — AB (ref 65–99)

## 2016-06-13 MED ORDER — INSULIN GLARGINE 100 UNIT/ML ~~LOC~~ SOLN
24.0000 [IU] | Freq: Every day | SUBCUTANEOUS | Status: DC
  Administered 2016-06-13 – 2016-06-14 (×2): 24 [IU] via SUBCUTANEOUS
  Filled 2016-06-13 (×2): qty 0.24

## 2016-06-13 NOTE — Progress Notes (Signed)
Received call from central telemetry that patient has 3 beat run x 2. Patient asymptomatic. VSS. Message sent to MD. Will continue to monitor. Bartholomew Crews, RN

## 2016-06-13 NOTE — Progress Notes (Signed)
PROGRESS NOTE        PATIENT DETAILS Name: Alec Solis Age: 82 y.o. Sex: male Date of Birth: 08/02/1933 Admit Date: 06/11/2016 Admitting Physician Wallis Bamberg, MD SX:[id number] N, MD  Brief Narrative: Patient is a 82 y.o. male with history of diabetes mellitus, chronic kidney disease stage III, who presented to the ED following a mechanical fall. He was found to have uncontrolled diabetes with hyper glycemia, acute on chronic kidney disease and subsequently admitted to the hospitalist service.   Subjective: Continues to have back pain-very minimal ambulation due to pain. Sugars are much better.  Denies anterior chest wall pain, SOB. Denies nausea, vomiting.  Denies low back pain, right sided pain.   Assessment/Plan: Uncontrolled type 2 diabetes with Hyperglycemia: Since he is symptomatic with polyuria and polydipsia-started on insulin-CBGs are better controlled-will increase Lantus to 24 units daily, continue NovoLog 6 units before meals and SSI. A1c significantly elevated at 13.8. He is familiar with insulin administration, he has used insulin in the past.  Acute on chronic kidney disease stage III: likely secondary to prerenal azotemia setting of hyperglycemia. Creatinine is improved and close to his usual baseline. Follow electrolytes periodically.  Anemia: Slight decrease in hemoglobin-suspect secondary to IV fluid dilution. Follow CBC   Hyponatremia: likely pseudohyponatremia due to severe hypoglycemia. Improving with correction of hyperglycemia. Follow electrolytes   Back pain from mechanical fall: Continues to have mid thoracic pain-limiting ambulation-not much improvement with a trial of Tylenol/muscle relaxant and physical therapy-obtain MRI today. Continue to follow.    Hypertension: Controlled, continue Coreg  BPH: Continue Flomax  GERD: Continue PPI  Other-appears to be on both aspirin and Plavix-denies history of CAD or  CVA-patient nor family apparently aware of indication for dual antiplatelets   DVT Prophylaxis: Lovenox  Code Status: Full code  Family Communication: Spouse at bedside  Disposition Plan: Remain inpatient -home likely on 10/22 if clinical improvement continues  Antimicrobial agents: None  Procedures: None  CONSULTS: None  Time spent: 30 minutes  MEDICATIONS: Anti-infectives    None      Scheduled Meds: . acetaminophen  1,000 mg Oral Q8H  . amLODipine  5 mg Oral Daily  . aspirin EC  81 mg Oral Daily  . [START ON 06/14/2016] calcitRIOL  0.25 mcg Oral Q3 days  . carvedilol  25 mg Oral BID WC  . clopidogrel  75 mg Oral Daily  . enoxaparin (LOVENOX) injection  40 mg Subcutaneous QHS  . feeding supplement (GLUCERNA SHAKE)  237 mL Oral BID BM  . insulin aspart  0-5 Units Subcutaneous QHS  . insulin aspart  0-9 Units Subcutaneous TID WC  . insulin aspart  6 Units Subcutaneous TID WC  . insulin glargine  24 Units Subcutaneous Daily  . linagliptin  5 mg Oral Daily  . pantoprazole  80 mg Oral Daily  . sodium bicarbonate  1,300 mg Oral BID  . sodium chloride flush  3 mL Intravenous Q12H  . tamsulosin  0.4 mg Oral BID   Continuous Infusions:   PRN Meds:.methocarbamol, traMADol   PHYSICAL EXAM: Vital signs: Vitals:   06/12/16 1700 06/12/16 2314 06/13/16 0629 06/13/16 1019  BP: (!) 141/78 140/84 (!) 154/83 (!) 141/85  Pulse: 81 60 67 77  Resp: 18 19 18 18   Temp: 98.8 F (37.1 C) 98.6 F (37 C) 98.9 F (37.2 C) 98.3 F (  36.8 C)  TempSrc: Oral Oral Oral Oral  SpO2: 97% 100% 100% 100%  Weight:      Height:       Filed Weights   06/11/16 1525 06/11/16 2010  Weight: 79.8 kg (176 lb) 84.9 kg (187 lb 1.6 oz)   Body mass index is 25.38 kg/m.   General appearance :Awake, alert, calm. Speech Clear. Not toxic Looking Eyes: pupils equally reactive to light and accomodation,no scleral icterus.Pink conjunctiva HEENT: Atraumatic and Normocephalic Neck: supple, no  JVD. No cervical lymphadenopathy. No thyromegaly Resp:Good air entry bilaterally, no added sounds  CVS: S1 S2 regular, no murmurs.  GI: Bowel sounds present, Non tender and not distended with no gaurding, rigidity or rebound.No organomegaly GU: Negative CVA tenderness. Extremities: B/L Lower Ext shows no edema, both legs are warm to touch Neurology:  speech clear,Non focal, sensation is grossly intact. Psychiatric: Normal judgment and insight. Alert and oriented x 3. Normal mood. Musculoskeletal: Tenderness with palpation to left thoracic back. No lumbar spine tenderness. 5/5 lower extremity strength. Negative straight leg raise.  Skin:No Rash, warm and dry Wounds:N/A  I have personally reviewed following labs and imaging studies  LABORATORY DATA: CBC:  Recent Labs Lab 06/10/16 1039 06/11/16 1547 06/12/16 0510  WBC  --  6.3 6.7  NEUTROABS  --  4.0  --   HGB 12.1* 12.5* 10.6*  HCT  --  38.1* 32.0*  MCV  --  70.8* 70.2*  PLT  --  127* 146*    Basic Metabolic Panel:  Recent Labs Lab 06/11/16 1547 06/11/16 1954 06/12/16 0510 06/13/16 0446  NA 129* 132* 133* 134*  K 4.9 4.5 4.1 3.6  CL 95* 102 103 104  CO2 23 19* 23 23  GLUCOSE 505* 318* 328* 191*  BUN 29* 27* 21* 17  CREATININE 2.46* 2.20* 1.76* 1.65*  CALCIUM 10.2 9.8 9.2 9.2    GFR: Estimated Creatinine Clearance: 38.5 mL/min (by C-G formula based on SCr of 1.65 mg/dL (H)).  Liver Function Tests: No results for input(s): AST, ALT, ALKPHOS, BILITOT, PROT, ALBUMIN in the last 168 hours. No results for input(s): LIPASE, AMYLASE in the last 168 hours. No results for input(s): AMMONIA in the last 168 hours.  Coagulation Profile: No results for input(s): INR, PROTIME in the last 168 hours.  Cardiac Enzymes:  Recent Labs Lab 06/11/16 1547  TROPONINI <0.03    BNP (last 3 results) No results for input(s): PROBNP in the last 8760 hours.  HbA1C:  Recent Labs  06/12/16 0510  HGBA1C 13.8*     CBG:  Recent Labs Lab 06/12/16 0739 06/12/16 1135 06/12/16 1730 06/12/16 2311 06/13/16 0756  GLUCAP 324* 377* 123* 136* 214*    Lipid Profile: No results for input(s): CHOL, HDL, LDLCALC, TRIG, CHOLHDL, LDLDIRECT in the last 72 hours.  Thyroid Function Tests: No results for input(s): TSH, T4TOTAL, FREET4, T3FREE, THYROIDAB in the last 72 hours.  Anemia Panel: No results for input(s): VITAMINB12, FOLATE, FERRITIN, TIBC, IRON, RETICCTPCT in the last 72 hours.  Urine analysis:    Component Value Date/Time   COLORURINE YELLOW 06/11/2016 [place] 06/11/2016 1638   LABSPEC 1.023 06/11/2016 1638   LABSPEC 1.020 06/10/2011 1141   PHURINE 6.0 06/11/2016 1638   GLUCOSEU >1000 (A) 06/11/2016 1638   HGBUR TRACE (A) 06/11/2016 [place] 06/11/2016 1638   BILIRUBINUR Negative 06/10/2011 1141   [place] 06/11/2016 [place] 06/11/2016 1638   NITRITE NEGATIVE 06/11/2016 1638  LEUKOCYTESUR NEGATIVE 06/11/2016 1638   LEUKOCYTESUR Negative 06/10/2011 1141    Sepsis Labs: Lactic Acid, Venous No results found for: LATICACIDVEN  MICROBIOLOGY: No results found for this or any previous visit (from the past 240 hour(s)).  RADIOLOGY STUDIES/RESULTS: Dg Chest 2 View  Result Date: 06/11/2016 CLINICAL DATA:  Fall with left posterior rib pain EXAM: CHEST  2 VIEW COMPARISON:  06/02/2016 FINDINGS: Remote appearing posterior left seventh rib fracture. No acute fracture is noted. No evidence of hemothorax or pneumothorax. Notable asymmetric proximal right humerus osteopenia. No cardiomegaly. Stable aortic tortuosity and upper right mediastinal convexity from tortuous vessels on 2012 chest CT. IMPRESSION: Stable from prior.  No evidence of acute disease or injury. Electronically Signed   By: Monte Fantasia M.D.   On: 06/11/2016 16:22   Dg Chest 2 View  Result Date: 06/02/2016 CLINICAL DATA:  Hyperglycemia since medication  change 2 months ago. History of diabetes and hypertension. Discontinue smoking 1 year ago. EXAM: CHEST  2 VIEW COMPARISON:  PA and lateral chest x-ray of May 26, 2016 FINDINGS: The lungs are adequately inflated. There is no focal infiltrate or pleural effusion. The interstitial markings are coarse. The heart and pulmonary vascularity are normal. The mediastinum is normal in width. There is calcification in the wall of the aortic arch. The bony thorax exhibits no acute abnormality. IMPRESSION: Mild chronic bronchitic-smoking related changes. No CHF, pneumonia, nor other acute cardiopulmonary abnormality. Electronically Signed   By: David  Martinique M.D.   On: 06/02/2016 11:57   Dg Chest 2 View  Result Date: 05/26/2016 CLINICAL DATA:  Chronic posterior right chest pain for years. EXAM: CHEST  2 VIEW COMPARISON:  Chest CT 06/26/2011 FINDINGS: Heart and mediastinal contours are within normal limits. No focal opacities or effusions. No acute bony abnormality. IMPRESSION: No active cardiopulmonary disease. Electronically Signed   By: Rolm Baptise M.D.   On: 05/26/2016 13:25   Ct Head Wo Contrast  Result Date: 06/11/2016 CLINICAL DATA:  Tripped and fell this morning, left posterior rib pain EXAM: CT HEAD WITHOUT CONTRAST CT CERVICAL SPINE WITHOUT CONTRAST TECHNIQUE: Multidetector CT imaging of the head and cervical spine was performed following the standard protocol without intravenous contrast. Multiplanar CT image reconstructions of the cervical spine were also generated. COMPARISON:  None. FINDINGS: CT HEAD FINDINGS Brain: No intracranial hemorrhage, mass effect or midline shift. No acute cortical infarction. No mass lesion is noted on this unenhanced scan. Mild cerebral atrophy. Mild periventricular white matter decreased attenuation probable due to chronic small vessel ischemic changes. Vascular: Atherosclerotic calcifications of carotid siphon. Skull: No skull fracture is noted. Sinuses/Orbits: Unremarkable  Other: None CT CERVICAL SPINE FINDINGS Alignment: There is normal alignment. Skull base and vertebrae: No acute fracture or subluxation. Degenerative changes are noted C1-C2 articulation. Anterior spurring noted lower endplate of C2 vertebral body. Mild anterior spurring upper and lower endplate of C5 vertebral body. Soft tissues and spinal canal: No prevertebral soft tissue swelling. Spinal canal is patent. Central subtle cervical airway is patent. Disc levels: There is disc space flattening with mild anterior mild posterior spurring at C3-C4 level. Disc space flattening with mild anterior and mild posterior spurring at C5-C6 level. The visualized lung apices shows no evidence of pneumothorax. Bilateral emphysematous changes are noted. Other: None IMPRESSION: 1. No acute intracranial abnormality. Mild cerebral atrophy. Mild periventricular white matter decreased attenuation probable due to chronic small vessel ischemic changes. 2. No cervical spine acute fracture or subluxation. Degenerative changes as described above. Electronically Signed  By: Lahoma Crocker M.D.   On: 06/11/2016 16:32   Ct Cervical Spine Wo Contrast  Result Date: 06/11/2016 CLINICAL DATA:  Tripped and fell this morning, left posterior rib pain EXAM: CT HEAD WITHOUT CONTRAST CT CERVICAL SPINE WITHOUT CONTRAST TECHNIQUE: Multidetector CT imaging of the head and cervical spine was performed following the standard protocol without intravenous contrast. Multiplanar CT image reconstructions of the cervical spine were also generated. COMPARISON:  None. FINDINGS: CT HEAD FINDINGS Brain: No intracranial hemorrhage, mass effect or midline shift. No acute cortical infarction. No mass lesion is noted on this unenhanced scan. Mild cerebral atrophy. Mild periventricular white matter decreased attenuation probable due to chronic small vessel ischemic changes. Vascular: Atherosclerotic calcifications of carotid siphon. Skull: No skull fracture is noted.  Sinuses/Orbits: Unremarkable Other: None CT CERVICAL SPINE FINDINGS Alignment: There is normal alignment. Skull base and vertebrae: No acute fracture or subluxation. Degenerative changes are noted C1-C2 articulation. Anterior spurring noted lower endplate of C2 vertebral body. Mild anterior spurring upper and lower endplate of C5 vertebral body. Soft tissues and spinal canal: No prevertebral soft tissue swelling. Spinal canal is patent. Central subtle cervical airway is patent. Disc levels: There is disc space flattening with mild anterior mild posterior spurring at C3-C4 level. Disc space flattening with mild anterior and mild posterior spurring at C5-C6 level. The visualized lung apices shows no evidence of pneumothorax. Bilateral emphysematous changes are noted. Other: None IMPRESSION: 1. No acute intracranial abnormality. Mild cerebral atrophy. Mild periventricular white matter decreased attenuation probable due to chronic small vessel ischemic changes. 2. No cervical spine acute fracture or subluxation. Degenerative changes as described above. Electronically Signed   By: Lahoma Crocker M.D.   On: 06/11/2016 16:32     LOS: 0 days   [facility] Pager:[phone]  If 7PM-7AM, please contact night-coverage [email or web address] Password [email or web address] 06/13/2016, 11:14 AM

## 2016-06-14 DIAGNOSIS — N400 Enlarged prostate without lower urinary tract symptoms: Secondary | ICD-10-CM

## 2016-06-14 DIAGNOSIS — E0800 Diabetes mellitus due to underlying condition with hyperosmolarity without nonketotic hyperglycemic-hyperosmolar coma (NKHHC): Secondary | ICD-10-CM

## 2016-06-14 LAB — GLUCOSE, CAPILLARY
GLUCOSE-CAPILLARY: 167 mg/dL — AB (ref 65–99)
Glucose-Capillary: 256 mg/dL — ABNORMAL HIGH (ref 65–99)

## 2016-06-14 MED ORDER — INSULIN PEN NEEDLE 32G X 8 MM MISC: 0 refills | Status: DC

## 2016-06-14 MED ORDER — LIVING WELL WITH DIABETES BOOK
Freq: Once | Status: AC
  Administered 2016-06-14: 11:00:00
  Filled 2016-06-14: qty 1

## 2016-06-14 MED ORDER — GLUCERNA SHAKE PO LIQD: 237.0000 mL | Freq: Two times a day (BID) | ORAL | 0 refills | Status: DC

## 2016-06-14 MED ORDER — INSULIN GLARGINE 100 UNIT/ML SOLOSTAR PEN: 24.0000 [IU] | PEN_INJECTOR | Freq: Every day | SUBCUTANEOUS | 0 refills | Status: DC

## 2016-06-14 MED ORDER — METHOCARBAMOL 750 MG PO TABS: 750.0000 mg | ORAL_TABLET | Freq: Four times a day (QID) | ORAL | 0 refills | Status: DC | PRN

## 2016-06-14 MED ORDER — INSULIN ASPART 100 UNIT/ML FLEXPEN
6.0000 [IU] | PEN_INJECTOR | Freq: Three times a day (TID) | SUBCUTANEOUS | 0 refills | Status: DC
Start: 2016-06-14 — End: 2019-03-07

## 2016-06-14 MED ORDER — TRAMADOL HCL 50 MG PO TABS: 50.0000 mg | ORAL_TABLET | Freq: Four times a day (QID) | ORAL | 0 refills | Status: DC | PRN

## 2016-06-14 MED ORDER — ACETAMINOPHEN 500 MG PO TABS: 1000.0000 mg | ORAL_TABLET | Freq: Three times a day (TID) | ORAL | 0 refills | Status: DC | PRN

## 2016-06-14 NOTE — Progress Notes (Signed)
Upper back pain slowly improving-but still persists. Patient much more comfortable and able to move around much more than the past few days. MRI of the thoracic spine negative. CBGs relatively stable-he wants to go home-I have asked him to follow-up with his primary care practitioner in the next few days. See discharge summary for details.

## 2016-06-14 NOTE — Progress Notes (Signed)
Patient discharged home per MD. Discharge instructions reviewed with patient and spouse. NSL discontinued. Patient escorted out via wheelchair and NT. Bartholomew Crews, RN

## 2016-06-14 NOTE — Discharge Summary (Signed)
PATIENT DETAILS Name: Alec Solis Age: 82 y.o. Sex: male Date of Birth: 26-Jun-1933 MRN: [id number]. Admitting Physician: Wallis Bamberg, MD [id number] N, MD  Admit Date: 06/11/2016 Discharge date: 06/14/2016  Recommendations for Outpatient Follow-up:  1. Follow up with PCP in 1 weeks 2. Please obtain BMP/CBC in one week 3. Please re-assess upper/mid back pain at follow up  Admitted From:  Home  Disposition: Home with home health services    Home Health: Yea  Equipment/Devices: None  Discharge Condition: Stable  CODE STATUS: FULL CODE  Diet recommendation:  Heart Healthy / Carb Modified  Brief Summary: See H&P, Labs, Consult and Test reports for all details in brief, Patient is a 82 y.o. male with history of diabetes mellitus, chronic kidney disease stage III, who presented to the ED following a mechanical fall. He was found to have uncontrolled diabetes with hyper glycemia, acute on chronic kidney disease and subsequently admitted to the hospitalist service.   Brief Hospital Course: Uncontrolled type 2 diabetes with Hyperglycemia: Since he was symptomatic with polyuria and polydipsia-started on insulin-CBGs are better controlled-continue Lantus to 24 units daily, continue NovoLog 6 units before meals. A1c significantly elevated at 13.8. He is familiar with insulin administration, he has used insulin in the past. He is stable to be discharged home with further optimization of his insulin regimen to be done by his PCP in the outpatient setting.  Acute on chronic kidney disease stage III: likely secondary to prerenal azotemia setting of hyperglycemia. Creatinine is improved and close to his usual baseline. Follow electrolytes periodically as outpatient.  Anemia: Slight decrease in hemoglobin-suspect secondary to IV fluid dilution. Follow CBC closely in the outpatient setting   Hyponatremia: likely pseudohyponatremia due to severe hypoglycemia. Improving  with correction of hyperglycemia. Follow electrolytes in the outpatient setting  Back pain from mechanical fall: Continues to have mid/upper thoracic pain-better than the past few days with Tylenol/Robaxin and as needed tramadol. Pain is mostly with movement of his upper extremities and torso, and also reproducible on palpation of his left lateral mid/upper spine area. I suspect that he may have had muscle strain when he fell.MRI of the thoracic spine negative for  acute abnormalities. He seems much more comfortable than the past few days, and seems to have much more range of motion. Seen by physical therapy, home health services ordered. Have asked patient to continue taking Tylenol and other supportive follow-up with PCP.  Hypertension: Controlled, continue Coreg  BPH: Continue Flomax  GERD: Continue PPI  Other-appears to be on both aspirin and Plavix-denies history of CAD or CVA-patient nor family apparently aware of indication for dual antiplatelets-continue for now and follow up with PCP.  Procedures/Studies: None  Discharge Diagnoses:  Active Problems:   Deficiency anemia   Diabetes mellitus ([place])   Acute renal failure (ARF) (HCC)   Hyperglycemia   Discharge Instructions:  Activity:  As tolerated with Full fall precautions use walker/cane & assistance as needed  Discharge Instructions    Call MD for:  persistant nausea and vomiting    Complete by:  As directed    Call MD for:  severe uncontrolled pain    Complete by:  As directed    Diet - low sodium heart healthy    Complete by:  As directed    Diet Carb Modified    Complete by:  As directed    Discharge instructions    Complete by:  As directed    Follow with Primary MD  SANDERS,ROBYN  N, MD  within 1 week  If you continue to have back pain-try taking Tylenol first, if Tylenol does not resolve the back pain-then take Ultram/tramadol. Avoid nonsteroidal anti-inflammatory medications given chronic kidney  disease.  Please get a complete blood count and chemistry panel checked by your Primary MD at your next visit, and again as instructed by your Primary MD.  Get Medicines reviewed and adjusted: Please take all your medications with you for your next visit with your Primary MD  Laboratory/radiological data: Please request your Primary MD to go over all hospital tests and procedure/radiological results at the follow up, please ask your Primary MD to get all Hospital records sent to his/her office.  In some cases, they will be blood work, cultures and biopsy results pending at the time of your discharge. Please request that your primary care M.D. follows up on these results.  Also Note the following: If you experience worsening of your admission symptoms, develop shortness of breath, life threatening emergency, suicidal or homicidal thoughts you must seek medical attention immediately by calling 911 or calling your MD immediately  if symptoms less severe.  You must read complete instructions/literature along with all the possible adverse reactions/side effects for all the Medicines you take and that have been prescribed to you. Take any new Medicines after you have completely understood and accpet all the possible adverse reactions/side effects.   Do not drive when taking Pain medications or sleeping medications (Benzodaizepines)  Do not take more than prescribed Pain, Sleep and Anxiety Medications. It is not advisable to combine anxiety,sleep and pain medications without talking with your primary care practitioner  Special Instructions: If you have smoked or chewed Tobacco  in the last 2 yrs please stop smoking, stop any regular Alcohol  and or any Recreational drug use.  Wear Seat belts while driving.  Please note: You were cared for by a hospitalist during your hospital stay. Once you are discharged, your primary care physician will handle any further medical issues. Please note that NO  REFILLS for any discharge medications will be authorized once you are discharged, as it is imperative that you return to your primary care physician (or establish a relationship with a primary care physician if you do not have one) for your post hospital discharge needs so that they can reassess your need for medications and monitor your lab values.   Increase activity slowly    Complete by:  As directed        Medication List    STOP taking these medications   sitaGLIPtin-metformin 50-1000 MG tablet Commonly known as:  JANUMET     TAKE these medications   acetaminophen 500 MG tablet Commonly known as:  TYLENOL Take 2 tablets (1,000 mg total) by mouth every 8 (eight) hours as needed.   allopurinol 100 MG tablet Commonly known as:  ZYLOPRIM Take 100 mg by mouth 2 (two) times daily.   amLODipine 5 MG tablet Commonly known as:  NORVASC Take 5 mg by mouth daily.   aspirin 81 MG tablet Take 81 mg by mouth daily.   calcitRIOL 0.25 MCG capsule Commonly known as:  ROCALTROL Take 0.25 mcg by mouth See admin instructions. Monday Thursday Sunday,  Pt takes every 3rd day   carvedilol 25 MG tablet Commonly known as:  COREG Take 25 mg by mouth 2 (two) times daily with a meal.   clopidogrel 75 MG tablet Commonly known as:  PLAVIX Take 75 mg by mouth daily.   feeding  supplement (GLUCERNA SHAKE) Liqd Take 237 mLs by mouth 2 (two) times daily between meals.   FLUZONE HIGH-DOSE 0.5 ML Susy Generic drug:  Influenza Vac Split High-Dose Inject 1 application into the muscle once.   insulin aspart 100 UNIT/ML FlexPen Commonly known as:  NOVOLOG FLEXPEN Inject 6 Units into the skin 3 (three) times daily with meals.   Insulin Glargine 100 UNIT/ML Solostar Pen Commonly known as:  LANTUS Inject 24 Units into the skin daily.   Insulin Pen Needle 32G X 8 MM Misc Use as directed   methocarbamol 750 MG tablet Commonly known as:  ROBAXIN Take 1 tablet (750 mg total) by mouth every 6 (six)  hours as needed for muscle spasms.   omeprazole 40 MG capsule Commonly known as:  PRILOSEC Take 40 mg by mouth daily.   sodium bicarbonate 650 MG tablet Take 1,300 mg by mouth 2 (two) times daily.   tamsulosin 0.4 MG Caps capsule Commonly known as:  FLOMAX Take 0.4 mg by mouth 2 (two) times daily.   traMADol 50 MG tablet Commonly known as:  ULTRAM Take 1 tablet (50 mg total) by mouth every 6 (six) hours as needed for moderate pain.      Follow-up Information    Maximino Greenland, MD. Schedule an appointment as soon as possible for a visit in 1 week(s).   Specialty:  Internal Medicine Contact information: [address] [place] [address] [phone]          No Known Allergies  Consultations:   None  Other Procedures/Studies: Dg Chest 2 View  Result Date: 06/11/2016 CLINICAL DATA:  Fall with left posterior rib pain EXAM: CHEST  2 VIEW COMPARISON:  06/02/2016 FINDINGS: Remote appearing posterior left seventh rib fracture. No acute fracture is noted. No evidence of hemothorax or pneumothorax. Notable asymmetric proximal right humerus osteopenia. No cardiomegaly. Stable aortic tortuosity and upper right mediastinal convexity from tortuous vessels on 2012 chest CT. IMPRESSION: Stable from prior.  No evidence of acute disease or injury. Electronically Signed   By: Monte Fantasia M.D.   On: 06/11/2016 16:22   Dg Chest 2 View  Result Date: 06/02/2016 CLINICAL DATA:  Hyperglycemia since medication change 2 months ago. History of diabetes and hypertension. Discontinue smoking 1 year ago. EXAM: CHEST  2 VIEW COMPARISON:  PA and lateral chest x-ray of May 26, 2016 FINDINGS: The lungs are adequately inflated. There is no focal infiltrate or pleural effusion. The interstitial markings are coarse. The heart and pulmonary vascularity are normal. The mediastinum is normal in width. There is calcification in the wall of the aortic arch. The bony thorax exhibits no  acute abnormality. IMPRESSION: Mild chronic bronchitic-smoking related changes. No CHF, pneumonia, nor other acute cardiopulmonary abnormality. Electronically Signed   By: David  Martinique M.D.   On: 06/02/2016 11:57   Dg Chest 2 View  Result Date: 05/26/2016 CLINICAL DATA:  Chronic posterior right chest pain for years. EXAM: CHEST  2 VIEW COMPARISON:  Chest CT 06/26/2011 FINDINGS: Heart and mediastinal contours are within normal limits. No focal opacities or effusions. No acute bony abnormality. IMPRESSION: No active cardiopulmonary disease. Electronically Signed   By: Rolm Baptise M.D.   On: 05/26/2016 13:25   Ct Head Wo Contrast  Result Date: 06/11/2016 CLINICAL DATA:  Tripped and fell this morning, left posterior rib pain EXAM: CT HEAD WITHOUT CONTRAST CT CERVICAL SPINE WITHOUT CONTRAST TECHNIQUE: Multidetector CT imaging of the head and cervical spine was performed following the standard protocol  without intravenous contrast. Multiplanar CT image reconstructions of the cervical spine were also generated. COMPARISON:  None. FINDINGS: CT HEAD FINDINGS Brain: No intracranial hemorrhage, mass effect or midline shift. No acute cortical infarction. No mass lesion is noted on this unenhanced scan. Mild cerebral atrophy. Mild periventricular white matter decreased attenuation probable due to chronic small vessel ischemic changes. Vascular: Atherosclerotic calcifications of carotid siphon. Skull: No skull fracture is noted. Sinuses/Orbits: Unremarkable Other: None CT CERVICAL SPINE FINDINGS Alignment: There is normal alignment. Skull base and vertebrae: No acute fracture or subluxation. Degenerative changes are noted C1-C2 articulation. Anterior spurring noted lower endplate of C2 vertebral body. Mild anterior spurring upper and lower endplate of C5 vertebral body. Soft tissues and spinal canal: No prevertebral soft tissue swelling. Spinal canal is patent. Central subtle cervical airway is patent. Disc levels:  There is disc space flattening with mild anterior mild posterior spurring at C3-C4 level. Disc space flattening with mild anterior and mild posterior spurring at C5-C6 level. The visualized lung apices shows no evidence of pneumothorax. Bilateral emphysematous changes are noted. Other: None IMPRESSION: 1. No acute intracranial abnormality. Mild cerebral atrophy. Mild periventricular white matter decreased attenuation probable due to chronic small vessel ischemic changes. 2. No cervical spine acute fracture or subluxation. Degenerative changes as described above. Electronically Signed   By: Lahoma Crocker M.D.   On: 06/11/2016 16:32   Ct Cervical Spine Wo Contrast  Result Date: 06/11/2016 CLINICAL DATA:  Tripped and fell this morning, left posterior rib pain EXAM: CT HEAD WITHOUT CONTRAST CT CERVICAL SPINE WITHOUT CONTRAST TECHNIQUE: Multidetector CT imaging of the head and cervical spine was performed following the standard protocol without intravenous contrast. Multiplanar CT image reconstructions of the cervical spine were also generated. COMPARISON:  None. FINDINGS: CT HEAD FINDINGS Brain: No intracranial hemorrhage, mass effect or midline shift. No acute cortical infarction. No mass lesion is noted on this unenhanced scan. Mild cerebral atrophy. Mild periventricular white matter decreased attenuation probable due to chronic small vessel ischemic changes. Vascular: Atherosclerotic calcifications of carotid siphon. Skull: No skull fracture is noted. Sinuses/Orbits: Unremarkable Other: None CT CERVICAL SPINE FINDINGS Alignment: There is normal alignment. Skull base and vertebrae: No acute fracture or subluxation. Degenerative changes are noted C1-C2 articulation. Anterior spurring noted lower endplate of C2 vertebral body. Mild anterior spurring upper and lower endplate of C5 vertebral body. Soft tissues and spinal canal: No prevertebral soft tissue swelling. Spinal canal is patent. Central subtle cervical airway  is patent. Disc levels: There is disc space flattening with mild anterior mild posterior spurring at C3-C4 level. Disc space flattening with mild anterior and mild posterior spurring at C5-C6 level. The visualized lung apices shows no evidence of pneumothorax. Bilateral emphysematous changes are noted. Other: None IMPRESSION: 1. No acute intracranial abnormality. Mild cerebral atrophy. Mild periventricular white matter decreased attenuation probable due to chronic small vessel ischemic changes. 2. No cervical spine acute fracture or subluxation. Degenerative changes as described above. Electronically Signed   By: Lahoma Crocker M.D.   On: 06/11/2016 16:32   Mr Thoracic Spine Wo Contrast  Result Date: 06/13/2016 CLINICAL DATA:  Fall, mid back and left flank pain starting on Thursday. EXAM: MRI THORACIC SPINE WITHOUT CONTRAST TECHNIQUE: Multiplanar, multisequence MR imaging of the thoracic spine was performed. No intravenous contrast was administered. COMPARISON:  None. FINDINGS: Alignment:  No subluxation identified.  Thoracic kyphosis noted. Vertebrae: Marrow heterogeneity is present. Although this can be caused by marrow infiltrative processes, the most common causes include anemia,  smoking, obesity, or advancing age. No compression fracture is identified. Hemangioma in the spinous process of T5. Cord:  No significant abnormal spinal cord signal is observed. Paraspinal and other soft tissues: Bilateral fluid signal intensity renal lesions, probably cysts. Suspected left posterior thyroid nodule, incompletely visualized. Disc levels: T1-2:  Unremarkable. T2-3:  Unremarkable. T3-4:  Unremarkable. T4-5:  Unremarkable. T5-6:  Unremarkable. T6-7:  Unremarkable. T7-8:  Unremarkable. T8-9:  Unremarkable. T9-10:  Unremarkable. T10-11:  Unremarkable. T11-12:  No impingement.  Mild right facet arthropathy. IMPRESSION: 1. Diffuse low T1 signal in the thoracic spine vertebra, possibly related to the patient's renal failure or  anemia, marrow infiltrative process, or advancing age. 2. No thoracic spine impingement or compression fracture identified. 3. Thoracic kyphosis. 4. Left posterior thyroid nodule versus less likely adenopathy, about 2.4 cm. Not appreciably changed from prior chest CT, likely benign. Electronically Signed   By: Van Clines M.D.   On: 06/13/2016 12:35      TODAY-DAY OF DISCHARGE:  Subjective:   Alec Solis today has no headache,no chest abdominal pain,no new weakness tingling or numbness, feels much better wants to go home today.   Objective:   Blood pressure 112/68, pulse 71, temperature 98.6 F (37 C), temperature source Oral, resp. rate 18, height 6' (1.829 m), weight 84.9 kg (187 lb 2.7 oz), SpO2 99 %.  Intake/Output Summary (Last 24 hours) at 06/14/16 1143 Last data filed at 06/14/16 0900  Gross per 24 hour  Intake             1020 ml  Output              700 ml  Net              320 ml   Filed Weights   06/11/16 1525 06/11/16 2010 06/13/16 2043  Weight: 79.8 kg (176 lb) 84.9 kg (187 lb 1.6 oz) 84.9 kg (187 lb 2.7 oz)    Exam: Awake Alert, Oriented *3, No new F.N deficits, Normal affect [place].AT,PERRAL Supple Neck,No JVD, No cervical lymphadenopathy appriciated.  Symmetrical Chest wall movement, Good air movement bilaterally, CTAB RRR,No Gallops,Rubs or new Murmurs, No Parasternal Heave +ve B.Sounds, Abd Soft, Non tender, No organomegaly appriciated, No rebound -guarding or rigidity. No Cyanosis, Clubbing or edema, No new Rash or bruise   PERTINENT RADIOLOGIC STUDIES: Dg Chest 2 View  Result Date: 06/11/2016 CLINICAL DATA:  Fall with left posterior rib pain EXAM: CHEST  2 VIEW COMPARISON:  06/02/2016 FINDINGS: Remote appearing posterior left seventh rib fracture. No acute fracture is noted. No evidence of hemothorax or pneumothorax. Notable asymmetric proximal right humerus osteopenia. No cardiomegaly. Stable aortic tortuosity and upper right mediastinal convexity  from tortuous vessels on 2012 chest CT. IMPRESSION: Stable from prior.  No evidence of acute disease or injury. Electronically Signed   By: Monte Fantasia M.D.   On: 06/11/2016 16:22   Dg Chest 2 View  Result Date: 06/02/2016 CLINICAL DATA:  Hyperglycemia since medication change 2 months ago. History of diabetes and hypertension. Discontinue smoking 1 year ago. EXAM: CHEST  2 VIEW COMPARISON:  PA and lateral chest x-ray of May 26, 2016 FINDINGS: The lungs are adequately inflated. There is no focal infiltrate or pleural effusion. The interstitial markings are coarse. The heart and pulmonary vascularity are normal. The mediastinum is normal in width. There is calcification in the wall of the aortic arch. The bony thorax exhibits no acute abnormality. IMPRESSION: Mild chronic bronchitic-smoking related changes. No CHF, pneumonia, nor other  acute cardiopulmonary abnormality. Electronically Signed   By: David  Martinique M.D.   On: 06/02/2016 11:57   Dg Chest 2 View  Result Date: 05/26/2016 CLINICAL DATA:  Chronic posterior right chest pain for years. EXAM: CHEST  2 VIEW COMPARISON:  Chest CT 06/26/2011 FINDINGS: Heart and mediastinal contours are within normal limits. No focal opacities or effusions. No acute bony abnormality. IMPRESSION: No active cardiopulmonary disease. Electronically Signed   By: Rolm Baptise M.D.   On: 05/26/2016 13:25   Ct Head Wo Contrast  Result Date: 06/11/2016 CLINICAL DATA:  Tripped and fell this morning, left posterior rib pain EXAM: CT HEAD WITHOUT CONTRAST CT CERVICAL SPINE WITHOUT CONTRAST TECHNIQUE: Multidetector CT imaging of the head and cervical spine was performed following the standard protocol without intravenous contrast. Multiplanar CT image reconstructions of the cervical spine were also generated. COMPARISON:  None. FINDINGS: CT HEAD FINDINGS Brain: No intracranial hemorrhage, mass effect or midline shift. No acute cortical infarction. No mass lesion is noted on  this unenhanced scan. Mild cerebral atrophy. Mild periventricular white matter decreased attenuation probable due to chronic small vessel ischemic changes. Vascular: Atherosclerotic calcifications of carotid siphon. Skull: No skull fracture is noted. Sinuses/Orbits: Unremarkable Other: None CT CERVICAL SPINE FINDINGS Alignment: There is normal alignment. Skull base and vertebrae: No acute fracture or subluxation. Degenerative changes are noted C1-C2 articulation. Anterior spurring noted lower endplate of C2 vertebral body. Mild anterior spurring upper and lower endplate of C5 vertebral body. Soft tissues and spinal canal: No prevertebral soft tissue swelling. Spinal canal is patent. Central subtle cervical airway is patent. Disc levels: There is disc space flattening with mild anterior mild posterior spurring at C3-C4 level. Disc space flattening with mild anterior and mild posterior spurring at C5-C6 level. The visualized lung apices shows no evidence of pneumothorax. Bilateral emphysematous changes are noted. Other: None IMPRESSION: 1. No acute intracranial abnormality. Mild cerebral atrophy. Mild periventricular white matter decreased attenuation probable due to chronic small vessel ischemic changes. 2. No cervical spine acute fracture or subluxation. Degenerative changes as described above. Electronically Signed   By: Lahoma Crocker M.D.   On: 06/11/2016 16:32   Ct Cervical Spine Wo Contrast  Result Date: 06/11/2016 CLINICAL DATA:  Tripped and fell this morning, left posterior rib pain EXAM: CT HEAD WITHOUT CONTRAST CT CERVICAL SPINE WITHOUT CONTRAST TECHNIQUE: Multidetector CT imaging of the head and cervical spine was performed following the standard protocol without intravenous contrast. Multiplanar CT image reconstructions of the cervical spine were also generated. COMPARISON:  None. FINDINGS: CT HEAD FINDINGS Brain: No intracranial hemorrhage, mass effect or midline shift. No acute cortical infarction. No  mass lesion is noted on this unenhanced scan. Mild cerebral atrophy. Mild periventricular white matter decreased attenuation probable due to chronic small vessel ischemic changes. Vascular: Atherosclerotic calcifications of carotid siphon. Skull: No skull fracture is noted. Sinuses/Orbits: Unremarkable Other: None CT CERVICAL SPINE FINDINGS Alignment: There is normal alignment. Skull base and vertebrae: No acute fracture or subluxation. Degenerative changes are noted C1-C2 articulation. Anterior spurring noted lower endplate of C2 vertebral body. Mild anterior spurring upper and lower endplate of C5 vertebral body. Soft tissues and spinal canal: No prevertebral soft tissue swelling. Spinal canal is patent. Central subtle cervical airway is patent. Disc levels: There is disc space flattening with mild anterior mild posterior spurring at C3-C4 level. Disc space flattening with mild anterior and mild posterior spurring at C5-C6 level. The visualized lung apices shows no evidence of pneumothorax. Bilateral emphysematous changes are  noted. Other: None IMPRESSION: 1. No acute intracranial abnormality. Mild cerebral atrophy. Mild periventricular white matter decreased attenuation probable due to chronic small vessel ischemic changes. 2. No cervical spine acute fracture or subluxation. Degenerative changes as described above. Electronically Signed   By: Lahoma Crocker M.D.   On: 06/11/2016 16:32   Mr Thoracic Spine Wo Contrast  Result Date: 06/13/2016 CLINICAL DATA:  Fall, mid back and left flank pain starting on Thursday. EXAM: MRI THORACIC SPINE WITHOUT CONTRAST TECHNIQUE: Multiplanar, multisequence MR imaging of the thoracic spine was performed. No intravenous contrast was administered. COMPARISON:  None. FINDINGS: Alignment:  No subluxation identified.  Thoracic kyphosis noted. Vertebrae: Marrow heterogeneity is present. Although this can be caused by marrow infiltrative processes, the most common causes include  anemia, smoking, obesity, or advancing age. No compression fracture is identified. Hemangioma in the spinous process of T5. Cord:  No significant abnormal spinal cord signal is observed. Paraspinal and other soft tissues: Bilateral fluid signal intensity renal lesions, probably cysts. Suspected left posterior thyroid nodule, incompletely visualized. Disc levels: T1-2:  Unremarkable. T2-3:  Unremarkable. T3-4:  Unremarkable. T4-5:  Unremarkable. T5-6:  Unremarkable. T6-7:  Unremarkable. T7-8:  Unremarkable. T8-9:  Unremarkable. T9-10:  Unremarkable. T10-11:  Unremarkable. T11-12:  No impingement.  Mild right facet arthropathy. IMPRESSION: 1. Diffuse low T1 signal in the thoracic spine vertebra, possibly related to the patient's renal failure or anemia, marrow infiltrative process, or advancing age. 2. No thoracic spine impingement or compression fracture identified. 3. Thoracic kyphosis. 4. Left posterior thyroid nodule versus less likely adenopathy, about 2.4 cm. Not appreciably changed from prior chest CT, likely benign. Electronically Signed   By: Van Clines M.D.   On: 06/13/2016 12:35     PERTINENT LAB RESULTS: CBC:  Recent Labs  06/11/16 1547 06/12/16 0510  WBC 6.3 6.7  HGB 12.5* 10.6*  HCT 38.1* 32.0*  PLT 127* 146*   CMET CMP     Component Value Date/Time   NA 134 (L) 06/13/2016 0446   K 3.6 06/13/2016 0446   CL 104 06/13/2016 0446   CO2 23 06/13/2016 0446   GLUCOSE 191 (H) 06/13/2016 0446   BUN 17 06/13/2016 0446   CREATININE 1.65 (H) 06/13/2016 0446   CALCIUM 9.2 06/13/2016 0446   PROT 7.3 06/02/2016 1217   ALBUMIN 3.8 06/02/2016 1217   AST 27 06/02/2016 1217   ALT 22 06/02/2016 1217   ALKPHOS 104 06/02/2016 1217   BILITOT 0.7 06/02/2016 1217   GFRNONAA 37 (L) 06/13/2016 0446   GFRAA 43 (L) 06/13/2016 0446    GFR Estimated Creatinine Clearance: 38.5 mL/min (by C-G formula based on SCr of 1.65 mg/dL (H)). No results for input(s): LIPASE, AMYLASE in the last 72  hours.  Recent Labs  06/11/16 1547  TROPONINI <0.03   Invalid input(s): POCBNP No results for input(s): DDIMER in the last 72 hours.  Recent Labs  06/12/16 0510  HGBA1C 13.8*   No results for input(s): CHOL, HDL, LDLCALC, TRIG, CHOLHDL, LDLDIRECT in the last 72 hours. No results for input(s): TSH, T4TOTAL, T3FREE, THYROIDAB in the last 72 hours.  Invalid input(s): FREET3 No results for input(s): VITAMINB12, FOLATE, FERRITIN, TIBC, IRON, RETICCTPCT in the last 72 hours. Coags: No results for input(s): INR in the last 72 hours.  Invalid input(s): PT Microbiology: No results found for this or any previous visit (from the past 240 hour(s)).  FURTHER DISCHARGE INSTRUCTIONS:  Get Medicines reviewed and adjusted: Please take all your medications with you for your  next visit with your Primary MD  Laboratory/radiological data: Please request your Primary MD to go over all hospital tests and procedure/radiological results at the follow up, please ask your Primary MD to get all Hospital records sent to his/her office.  In some cases, they will be blood work, cultures and biopsy results pending at the time of your discharge. Please request that your primary care M.D. goes through all the records of your hospital data and follows up on these results.  Also Note the following: If you experience worsening of your admission symptoms, develop shortness of breath, life threatening emergency, suicidal or homicidal thoughts you must seek medical attention immediately by calling 911 or calling your MD immediately  if symptoms less severe.  You must read complete instructions/literature along with all the possible adverse reactions/side effects for all the Medicines you take and that have been prescribed to you. Take any new Medicines after you have completely understood and accpet all the possible adverse reactions/side effects.   Do not drive when taking Pain medications or sleeping medications  (Benzodaizepines)  Do not take more than prescribed Pain, Sleep and Anxiety Medications. It is not advisable to combine anxiety,sleep and pain medications without talking with your primary care practitioner  Special Instructions: If you have smoked or chewed Tobacco  in the last 2 yrs please stop smoking, stop any regular Alcohol  and or any Recreational drug use.  Wear Seat belts while driving.  Please note: You were cared for by a hospitalist during your hospital stay. Once you are discharged, your primary care physician will handle any further medical issues. Please note that NO REFILLS for any discharge medications will be authorized once you are discharged, as it is imperative that you return to your primary care physician (or establish a relationship with a primary care physician if you do not have one) for your post hospital discharge needs so that they can reassess your need for medications and monitor your lab values.  Total Time spent coordinating discharge including counseling, education and face to face time equals 45 minutes.  SignedOren Binet 06/14/2016 11:43 AM

## 2016-06-15 DIAGNOSIS — Z87891 Personal history of nicotine dependence: Secondary | ICD-10-CM | POA: Diagnosis not present

## 2016-06-15 DIAGNOSIS — Z7982 Long term (current) use of aspirin: Secondary | ICD-10-CM | POA: Diagnosis not present

## 2016-06-15 DIAGNOSIS — M109 Gout, unspecified: Secondary | ICD-10-CM | POA: Diagnosis not present

## 2016-06-15 DIAGNOSIS — Z794 Long term (current) use of insulin: Secondary | ICD-10-CM | POA: Diagnosis not present

## 2016-06-15 DIAGNOSIS — Z86718 Personal history of other venous thrombosis and embolism: Secondary | ICD-10-CM | POA: Diagnosis not present

## 2016-06-15 DIAGNOSIS — Z9181 History of falling: Secondary | ICD-10-CM | POA: Diagnosis not present

## 2016-06-15 DIAGNOSIS — R0789 Other chest pain: Secondary | ICD-10-CM | POA: Diagnosis not present

## 2016-06-15 DIAGNOSIS — E1165 Type 2 diabetes mellitus with hyperglycemia: Secondary | ICD-10-CM | POA: Diagnosis not present

## 2016-06-15 DIAGNOSIS — I10 Essential (primary) hypertension: Secondary | ICD-10-CM | POA: Diagnosis not present

## 2016-06-15 DIAGNOSIS — D649 Anemia, unspecified: Secondary | ICD-10-CM | POA: Diagnosis not present

## 2016-06-16 DIAGNOSIS — Z9181 History of falling: Secondary | ICD-10-CM | POA: Diagnosis not present

## 2016-06-16 DIAGNOSIS — E1165 Type 2 diabetes mellitus with hyperglycemia: Secondary | ICD-10-CM | POA: Diagnosis not present

## 2016-06-16 DIAGNOSIS — D649 Anemia, unspecified: Secondary | ICD-10-CM | POA: Diagnosis not present

## 2016-06-16 DIAGNOSIS — R0789 Other chest pain: Secondary | ICD-10-CM | POA: Diagnosis not present

## 2016-06-16 DIAGNOSIS — I10 Essential (primary) hypertension: Secondary | ICD-10-CM | POA: Diagnosis not present

## 2016-06-16 DIAGNOSIS — M109 Gout, unspecified: Secondary | ICD-10-CM | POA: Diagnosis not present

## 2016-06-19 DIAGNOSIS — R0789 Other chest pain: Secondary | ICD-10-CM | POA: Diagnosis not present

## 2016-06-19 DIAGNOSIS — D649 Anemia, unspecified: Secondary | ICD-10-CM | POA: Diagnosis not present

## 2016-06-19 DIAGNOSIS — Z9181 History of falling: Secondary | ICD-10-CM | POA: Diagnosis not present

## 2016-06-19 DIAGNOSIS — E1165 Type 2 diabetes mellitus with hyperglycemia: Secondary | ICD-10-CM | POA: Diagnosis not present

## 2016-06-19 DIAGNOSIS — I10 Essential (primary) hypertension: Secondary | ICD-10-CM | POA: Diagnosis not present

## 2016-06-19 DIAGNOSIS — M109 Gout, unspecified: Secondary | ICD-10-CM | POA: Diagnosis not present

## 2016-06-22 DIAGNOSIS — Z9181 History of falling: Secondary | ICD-10-CM | POA: Diagnosis not present

## 2016-06-22 DIAGNOSIS — D649 Anemia, unspecified: Secondary | ICD-10-CM | POA: Diagnosis not present

## 2016-06-22 DIAGNOSIS — R0789 Other chest pain: Secondary | ICD-10-CM | POA: Diagnosis not present

## 2016-06-22 DIAGNOSIS — M109 Gout, unspecified: Secondary | ICD-10-CM | POA: Diagnosis not present

## 2016-06-22 DIAGNOSIS — I10 Essential (primary) hypertension: Secondary | ICD-10-CM | POA: Diagnosis not present

## 2016-06-22 DIAGNOSIS — E1165 Type 2 diabetes mellitus with hyperglycemia: Secondary | ICD-10-CM | POA: Diagnosis not present

## 2016-06-23 DIAGNOSIS — M109 Gout, unspecified: Secondary | ICD-10-CM | POA: Diagnosis not present

## 2016-06-23 DIAGNOSIS — E1165 Type 2 diabetes mellitus with hyperglycemia: Secondary | ICD-10-CM | POA: Diagnosis not present

## 2016-06-23 DIAGNOSIS — D649 Anemia, unspecified: Secondary | ICD-10-CM | POA: Diagnosis not present

## 2016-06-23 DIAGNOSIS — Z79899 Other long term (current) drug therapy: Secondary | ICD-10-CM | POA: Diagnosis not present

## 2016-06-23 DIAGNOSIS — R0789 Other chest pain: Secondary | ICD-10-CM | POA: Diagnosis not present

## 2016-06-23 DIAGNOSIS — Z9181 History of falling: Secondary | ICD-10-CM | POA: Diagnosis not present

## 2016-06-23 DIAGNOSIS — I10 Essential (primary) hypertension: Secondary | ICD-10-CM | POA: Diagnosis not present

## 2016-06-24 ENCOUNTER — Encounter (HOSPITAL_COMMUNITY): Payer: Medicare Other

## 2016-06-26 DIAGNOSIS — Z9181 History of falling: Secondary | ICD-10-CM | POA: Diagnosis not present

## 2016-06-26 DIAGNOSIS — E1165 Type 2 diabetes mellitus with hyperglycemia: Secondary | ICD-10-CM | POA: Diagnosis not present

## 2016-06-26 DIAGNOSIS — D649 Anemia, unspecified: Secondary | ICD-10-CM | POA: Diagnosis not present

## 2016-06-26 DIAGNOSIS — M109 Gout, unspecified: Secondary | ICD-10-CM | POA: Diagnosis not present

## 2016-06-26 DIAGNOSIS — I10 Essential (primary) hypertension: Secondary | ICD-10-CM | POA: Diagnosis not present

## 2016-06-26 DIAGNOSIS — R0789 Other chest pain: Secondary | ICD-10-CM | POA: Diagnosis not present

## 2016-06-29 ENCOUNTER — Encounter (HOSPITAL_COMMUNITY)
Admission: RE | Admit: 2016-06-29 | Discharge: 2016-06-29 | Disposition: A | Discharge status: Home / Self Care | Payer: Medicare Other | Source: Ambulatory Visit | Attending: Nephrology | Admitting: Nephrology

## 2016-06-29 DIAGNOSIS — D631 Anemia in chronic kidney disease: Secondary | ICD-10-CM | POA: Insufficient documentation

## 2016-06-29 DIAGNOSIS — N289 Disorder of kidney and ureter, unspecified: Secondary | ICD-10-CM

## 2016-06-29 DIAGNOSIS — N183 Chronic kidney disease, stage 3 (moderate): Secondary | ICD-10-CM | POA: Insufficient documentation

## 2016-06-29 MED ORDER — EPOETIN ALFA 20000 UNIT/ML IJ SOLN
INTRAMUSCULAR | Status: AC
  Filled 2016-06-29: qty 1

## 2016-06-29 MED ORDER — EPOETIN ALFA 20000 UNIT/ML IJ SOLN
20000.0000 [IU] | INTRAMUSCULAR | Status: DC
Start: 2016-06-29 — End: 2016-06-30
  Administered 2016-06-29: 20000 [IU] via SUBCUTANEOUS

## 2016-06-30 DIAGNOSIS — E1165 Type 2 diabetes mellitus with hyperglycemia: Secondary | ICD-10-CM | POA: Diagnosis not present

## 2016-06-30 DIAGNOSIS — I10 Essential (primary) hypertension: Secondary | ICD-10-CM | POA: Diagnosis not present

## 2016-06-30 DIAGNOSIS — R0789 Other chest pain: Secondary | ICD-10-CM | POA: Diagnosis not present

## 2016-06-30 DIAGNOSIS — D649 Anemia, unspecified: Secondary | ICD-10-CM | POA: Diagnosis not present

## 2016-06-30 DIAGNOSIS — M109 Gout, unspecified: Secondary | ICD-10-CM | POA: Diagnosis not present

## 2016-06-30 DIAGNOSIS — Z9181 History of falling: Secondary | ICD-10-CM | POA: Diagnosis not present

## 2016-06-30 LAB — POCT HEMOGLOBIN-HEMACUE: Hemoglobin: 10.7 g/dL — ABNORMAL LOW (ref 13.0–17.0)

## 2016-07-06 DIAGNOSIS — D649 Anemia, unspecified: Secondary | ICD-10-CM | POA: Diagnosis not present

## 2016-07-06 DIAGNOSIS — E1165 Type 2 diabetes mellitus with hyperglycemia: Secondary | ICD-10-CM | POA: Diagnosis not present

## 2016-07-06 DIAGNOSIS — R0789 Other chest pain: Secondary | ICD-10-CM | POA: Diagnosis not present

## 2016-07-06 DIAGNOSIS — I10 Essential (primary) hypertension: Secondary | ICD-10-CM | POA: Diagnosis not present

## 2016-07-06 DIAGNOSIS — M109 Gout, unspecified: Secondary | ICD-10-CM | POA: Diagnosis not present

## 2016-07-06 DIAGNOSIS — Z9181 History of falling: Secondary | ICD-10-CM | POA: Diagnosis not present

## 2016-07-09 DIAGNOSIS — E1165 Type 2 diabetes mellitus with hyperglycemia: Secondary | ICD-10-CM | POA: Diagnosis not present

## 2016-07-09 DIAGNOSIS — D649 Anemia, unspecified: Secondary | ICD-10-CM | POA: Diagnosis not present

## 2016-07-09 DIAGNOSIS — Z9181 History of falling: Secondary | ICD-10-CM | POA: Diagnosis not present

## 2016-07-09 DIAGNOSIS — I10 Essential (primary) hypertension: Secondary | ICD-10-CM | POA: Diagnosis not present

## 2016-07-09 DIAGNOSIS — M109 Gout, unspecified: Secondary | ICD-10-CM | POA: Diagnosis not present

## 2016-07-09 DIAGNOSIS — R0789 Other chest pain: Secondary | ICD-10-CM | POA: Diagnosis not present

## 2016-07-13 ENCOUNTER — Encounter (HOSPITAL_COMMUNITY)
Admission: RE | Admit: 2016-07-13 | Discharge: 2016-07-13 | Disposition: A | Discharge status: Home / Self Care | Payer: Medicare Other | Source: Ambulatory Visit | Attending: Nephrology | Admitting: Nephrology

## 2016-07-13 DIAGNOSIS — D631 Anemia in chronic kidney disease: Secondary | ICD-10-CM | POA: Diagnosis not present

## 2016-07-13 DIAGNOSIS — N183 Chronic kidney disease, stage 3 (moderate): Secondary | ICD-10-CM | POA: Diagnosis not present

## 2016-07-13 DIAGNOSIS — N289 Disorder of kidney and ureter, unspecified: Secondary | ICD-10-CM

## 2016-07-13 LAB — POCT HEMOGLOBIN-HEMACUE: HEMOGLOBIN: 10.4 g/dL — AB (ref 13.0–17.0)

## 2016-07-13 LAB — GLUCOSE, CAPILLARY: GLUCOSE-CAPILLARY: 148 mg/dL — AB (ref 65–99)

## 2016-07-13 MED ORDER — EPOETIN ALFA 20000 UNIT/ML IJ SOLN
20000.0000 [IU] | INTRAMUSCULAR | Status: DC
  Administered 2016-07-13: 20000 [IU] via SUBCUTANEOUS

## 2016-07-13 MED ORDER — EPOETIN ALFA 20000 UNIT/ML IJ SOLN
INTRAMUSCULAR | Status: AC
  Administered 2016-07-13: 20000 [IU] via SUBCUTANEOUS
  Filled 2016-07-13: qty 1

## 2016-07-14 DIAGNOSIS — N08 Glomerular disorders in diseases classified elsewhere: Secondary | ICD-10-CM | POA: Diagnosis not present

## 2016-07-14 DIAGNOSIS — N183 Chronic kidney disease, stage 3 (moderate): Secondary | ICD-10-CM | POA: Diagnosis not present

## 2016-07-14 DIAGNOSIS — E1122 Type 2 diabetes mellitus with diabetic chronic kidney disease: Secondary | ICD-10-CM | POA: Diagnosis not present

## 2016-07-14 DIAGNOSIS — M25572 Pain in left ankle and joints of left foot: Secondary | ICD-10-CM | POA: Diagnosis not present

## 2016-07-27 ENCOUNTER — Encounter (HOSPITAL_COMMUNITY)
Admission: RE | Admit: 2016-07-27 | Discharge: 2016-07-27 | Disposition: A | Discharge status: Home / Self Care | Payer: Medicare Other | Source: Ambulatory Visit | Attending: Nephrology | Admitting: Nephrology

## 2016-07-27 DIAGNOSIS — D631 Anemia in chronic kidney disease: Secondary | ICD-10-CM | POA: Insufficient documentation

## 2016-07-27 DIAGNOSIS — N183 Chronic kidney disease, stage 3 (moderate): Secondary | ICD-10-CM | POA: Insufficient documentation

## 2016-07-27 DIAGNOSIS — N289 Disorder of kidney and ureter, unspecified: Secondary | ICD-10-CM

## 2016-07-27 LAB — IRON AND TIBC
Iron: 75 ug/dL (ref 45–182)
Saturation Ratios: 28 % (ref 17.9–39.5)
TIBC: 270 ug/dL (ref 250–450)
UIBC: 195 ug/dL

## 2016-07-27 LAB — FERRITIN: FERRITIN: 1500 ng/mL — AB (ref 24–336)

## 2016-07-27 LAB — POCT HEMOGLOBIN-HEMACUE: HEMOGLOBIN: 10.2 g/dL — AB (ref 13.0–17.0)

## 2016-07-27 MED ORDER — EPOETIN ALFA 20000 UNIT/ML IJ SOLN
INTRAMUSCULAR | Status: AC
  Filled 2016-07-27: qty 1

## 2016-07-27 MED ORDER — EPOETIN ALFA 20000 UNIT/ML IJ SOLN
20000.0000 [IU] | INTRAMUSCULAR | Status: DC
  Administered 2016-07-27: 20000 [IU] via SUBCUTANEOUS

## 2016-08-04 DIAGNOSIS — N183 Chronic kidney disease, stage 3 (moderate): Secondary | ICD-10-CM | POA: Diagnosis not present

## 2016-08-04 DIAGNOSIS — I129 Hypertensive chronic kidney disease with stage 1 through stage 4 chronic kidney disease, or unspecified chronic kidney disease: Secondary | ICD-10-CM | POA: Diagnosis not present

## 2016-08-04 DIAGNOSIS — Z1389 Encounter for screening for other disorder: Secondary | ICD-10-CM | POA: Diagnosis not present

## 2016-08-04 DIAGNOSIS — N08 Glomerular disorders in diseases classified elsewhere: Secondary | ICD-10-CM | POA: Diagnosis not present

## 2016-08-04 DIAGNOSIS — E1122 Type 2 diabetes mellitus with diabetic chronic kidney disease: Secondary | ICD-10-CM | POA: Diagnosis not present

## 2016-08-07 ENCOUNTER — Other Ambulatory Visit (HOSPITAL_COMMUNITY): Payer: Self-pay | Admitting: *Deleted

## 2016-08-10 ENCOUNTER — Encounter (HOSPITAL_COMMUNITY)
Admission: RE | Admit: 2016-08-10 | Discharge: 2016-08-10 | Disposition: A | Discharge status: Home / Self Care | Payer: Medicare Other | Source: Ambulatory Visit | Attending: Nephrology | Admitting: Nephrology

## 2016-08-10 DIAGNOSIS — D631 Anemia in chronic kidney disease: Secondary | ICD-10-CM | POA: Diagnosis not present

## 2016-08-10 DIAGNOSIS — N183 Chronic kidney disease, stage 3 (moderate): Secondary | ICD-10-CM | POA: Diagnosis not present

## 2016-08-10 DIAGNOSIS — N289 Disorder of kidney and ureter, unspecified: Secondary | ICD-10-CM

## 2016-08-10 LAB — POCT HEMOGLOBIN-HEMACUE: HEMOGLOBIN: 11.6 g/dL — AB (ref 13.0–17.0)

## 2016-08-10 MED ORDER — EPOETIN ALFA 20000 UNIT/ML IJ SOLN
INTRAMUSCULAR | Status: AC
  Filled 2016-08-10: qty 1

## 2016-08-10 MED ORDER — EPOETIN ALFA 20000 UNIT/ML IJ SOLN
20000.0000 [IU] | INTRAMUSCULAR | Status: DC
  Administered 2016-08-10: 20000 [IU] via SUBCUTANEOUS

## 2016-08-12 ENCOUNTER — Encounter (HOSPITAL_COMMUNITY): Payer: Medicare Other

## 2016-08-25 ENCOUNTER — Encounter (HOSPITAL_COMMUNITY)
Admission: RE | Admit: 2016-08-25 | Discharge: 2016-08-25 | Disposition: A | Discharge status: Home / Self Care | Payer: Medicare Other | Source: Ambulatory Visit | Attending: Nephrology | Admitting: Nephrology

## 2016-08-25 DIAGNOSIS — D631 Anemia in chronic kidney disease: Secondary | ICD-10-CM | POA: Diagnosis not present

## 2016-08-25 DIAGNOSIS — N183 Chronic kidney disease, stage 3 (moderate): Secondary | ICD-10-CM | POA: Diagnosis not present

## 2016-08-25 DIAGNOSIS — N289 Disorder of kidney and ureter, unspecified: Secondary | ICD-10-CM

## 2016-08-25 LAB — FERRITIN: Ferritin: 1314 ng/mL — ABNORMAL HIGH (ref 24–336)

## 2016-08-25 LAB — IRON AND TIBC
IRON: 70 ug/dL (ref 45–182)
Saturation Ratios: 24 % (ref 17.9–39.5)
TIBC: 287 ug/dL (ref 250–450)
UIBC: 217 ug/dL

## 2016-08-25 LAB — POCT HEMOGLOBIN-HEMACUE: HEMOGLOBIN: 11.1 g/dL — AB (ref 13.0–17.0)

## 2016-08-25 MED ORDER — EPOETIN ALFA 20000 UNIT/ML IJ SOLN
20000.0000 [IU] | INTRAMUSCULAR | Status: DC
  Administered 2016-08-25: 20000 [IU] via SUBCUTANEOUS

## 2016-08-25 MED ORDER — EPOETIN ALFA 20000 UNIT/ML IJ SOLN
INTRAMUSCULAR | Status: AC
  Filled 2016-08-25: qty 1

## 2016-09-07 ENCOUNTER — Other Ambulatory Visit (HOSPITAL_COMMUNITY): Payer: Self-pay | Admitting: *Deleted

## 2016-09-08 ENCOUNTER — Encounter (HOSPITAL_COMMUNITY)
Admission: RE | Admit: 2016-09-08 | Discharge: 2016-09-08 | Disposition: A | Discharge status: Home / Self Care | Payer: Medicare Other | Source: Ambulatory Visit | Attending: Nephrology | Admitting: Nephrology

## 2016-09-08 DIAGNOSIS — D631 Anemia in chronic kidney disease: Secondary | ICD-10-CM | POA: Diagnosis not present

## 2016-09-08 DIAGNOSIS — N183 Chronic kidney disease, stage 3 (moderate): Secondary | ICD-10-CM | POA: Diagnosis not present

## 2016-09-08 DIAGNOSIS — N289 Disorder of kidney and ureter, unspecified: Secondary | ICD-10-CM

## 2016-09-08 LAB — POCT HEMOGLOBIN-HEMACUE: HEMOGLOBIN: 11.1 g/dL — AB (ref 13.0–17.0)

## 2016-09-08 MED ORDER — FERUMOXYTOL INJECTION 510 MG/17 ML
510.0000 mg | Freq: Once | INTRAVENOUS | Status: AC
  Administered 2016-09-08: 11:00:00 510 mg via INTRAVENOUS
  Filled 2016-09-08: qty 17

## 2016-09-08 MED ORDER — EPOETIN ALFA 20000 UNIT/ML IJ SOLN
INTRAMUSCULAR | Status: AC
  Administered 2016-09-08: 20000 [IU] via SUBCUTANEOUS
  Filled 2016-09-08: qty 1

## 2016-09-08 MED ORDER — EPOETIN ALFA 20000 UNIT/ML IJ SOLN: 20000.0000 [IU] | INTRAMUSCULAR | Status: DC

## 2016-09-22 ENCOUNTER — Encounter (HOSPITAL_COMMUNITY)
Admission: RE | Admit: 2016-09-22 | Discharge: 2016-09-22 | Disposition: A | Discharge status: Home / Self Care | Payer: Medicare Other | Source: Ambulatory Visit | Attending: Nephrology | Admitting: Nephrology

## 2016-09-22 DIAGNOSIS — D631 Anemia in chronic kidney disease: Secondary | ICD-10-CM | POA: Diagnosis not present

## 2016-09-22 DIAGNOSIS — N183 Chronic kidney disease, stage 3 (moderate): Secondary | ICD-10-CM | POA: Diagnosis not present

## 2016-09-22 DIAGNOSIS — N289 Disorder of kidney and ureter, unspecified: Secondary | ICD-10-CM

## 2016-09-22 MED ORDER — EPOETIN ALFA 20000 UNIT/ML IJ SOLN
INTRAMUSCULAR | Status: AC
  Filled 2016-09-22: qty 1

## 2016-09-22 MED ORDER — EPOETIN ALFA 20000 UNIT/ML IJ SOLN
20000.0000 [IU] | INTRAMUSCULAR | Status: DC
  Administered 2016-09-22: 11:00:00 20000 [IU] via SUBCUTANEOUS

## 2016-09-23 LAB — POCT HEMOGLOBIN-HEMACUE: Hemoglobin: 11 g/dL — ABNORMAL LOW (ref 13.0–17.0)

## 2016-10-06 ENCOUNTER — Encounter (HOSPITAL_COMMUNITY)
Admission: RE | Admit: 2016-10-06 | Discharge: 2016-10-06 | Disposition: A | Discharge status: Home / Self Care | Payer: Medicare Other | Source: Ambulatory Visit | Attending: Nephrology | Admitting: Nephrology

## 2016-10-06 DIAGNOSIS — N183 Chronic kidney disease, stage 3 (moderate): Secondary | ICD-10-CM | POA: Insufficient documentation

## 2016-10-06 DIAGNOSIS — D631 Anemia in chronic kidney disease: Secondary | ICD-10-CM | POA: Insufficient documentation

## 2016-10-06 DIAGNOSIS — M889 Osteitis deformans of unspecified bone: Secondary | ICD-10-CM | POA: Diagnosis not present

## 2016-10-06 DIAGNOSIS — N2589 Other disorders resulting from impaired renal tubular function: Secondary | ICD-10-CM | POA: Diagnosis not present

## 2016-10-06 DIAGNOSIS — I129 Hypertensive chronic kidney disease with stage 1 through stage 4 chronic kidney disease, or unspecified chronic kidney disease: Secondary | ICD-10-CM | POA: Diagnosis not present

## 2016-10-06 DIAGNOSIS — N2581 Secondary hyperparathyroidism of renal origin: Secondary | ICD-10-CM | POA: Diagnosis not present

## 2016-10-06 DIAGNOSIS — M109 Gout, unspecified: Secondary | ICD-10-CM | POA: Diagnosis not present

## 2016-10-06 DIAGNOSIS — N289 Disorder of kidney and ureter, unspecified: Secondary | ICD-10-CM

## 2016-10-06 DIAGNOSIS — E1129 Type 2 diabetes mellitus with other diabetic kidney complication: Secondary | ICD-10-CM | POA: Diagnosis not present

## 2016-10-06 LAB — IRON AND TIBC
IRON: 112 ug/dL (ref 45–182)
Saturation Ratios: 37 % (ref 17.9–39.5)
TIBC: 307 ug/dL (ref 250–450)
UIBC: 195 ug/dL

## 2016-10-06 LAB — FERRITIN: Ferritin: 1603 ng/mL — ABNORMAL HIGH (ref 24–336)

## 2016-10-06 LAB — POCT HEMOGLOBIN-HEMACUE: Hemoglobin: 12 g/dL — ABNORMAL LOW (ref 13.0–17.0)

## 2016-10-06 MED ORDER — EPOETIN ALFA 20000 UNIT/ML IJ SOLN
INTRAMUSCULAR | Status: AC
  Filled 2016-10-06: qty 1

## 2016-10-06 MED ORDER — EPOETIN ALFA 20000 UNIT/ML IJ SOLN: 20000.0000 [IU] | INTRAMUSCULAR | Status: DC

## 2016-10-12 DIAGNOSIS — N183 Chronic kidney disease, stage 3 (moderate): Secondary | ICD-10-CM | POA: Diagnosis not present

## 2016-10-12 DIAGNOSIS — N08 Glomerular disorders in diseases classified elsewhere: Secondary | ICD-10-CM | POA: Diagnosis not present

## 2016-10-12 DIAGNOSIS — I129 Hypertensive chronic kidney disease with stage 1 through stage 4 chronic kidney disease, or unspecified chronic kidney disease: Secondary | ICD-10-CM | POA: Diagnosis not present

## 2016-10-12 DIAGNOSIS — E1122 Type 2 diabetes mellitus with diabetic chronic kidney disease: Secondary | ICD-10-CM | POA: Diagnosis not present

## 2016-10-20 ENCOUNTER — Encounter (HOSPITAL_COMMUNITY)
Admission: RE | Admit: 2016-10-20 | Discharge: 2016-10-20 | Disposition: A | Discharge status: Home / Self Care | Payer: Medicare Other | Source: Ambulatory Visit | Attending: Nephrology | Admitting: Nephrology

## 2016-10-20 DIAGNOSIS — N289 Disorder of kidney and ureter, unspecified: Secondary | ICD-10-CM

## 2016-10-20 DIAGNOSIS — D631 Anemia in chronic kidney disease: Secondary | ICD-10-CM | POA: Diagnosis not present

## 2016-10-20 DIAGNOSIS — N183 Chronic kidney disease, stage 3 (moderate): Secondary | ICD-10-CM | POA: Diagnosis not present

## 2016-10-20 LAB — POCT HEMOGLOBIN-HEMACUE: Hemoglobin: 11.9 g/dL — ABNORMAL LOW (ref 13.0–17.0)

## 2016-10-20 MED ORDER — EPOETIN ALFA 20000 UNIT/ML IJ SOLN
INTRAMUSCULAR | Status: AC
  Filled 2016-10-20: qty 1

## 2016-10-20 MED ORDER — EPOETIN ALFA 20000 UNIT/ML IJ SOLN
20000.0000 [IU] | INTRAMUSCULAR | Status: DC
  Administered 2016-10-20: 11:00:00 20000 [IU] via SUBCUTANEOUS

## 2016-11-02 ENCOUNTER — Other Ambulatory Visit (HOSPITAL_COMMUNITY): Payer: Self-pay | Admitting: *Deleted

## 2016-11-03 ENCOUNTER — Encounter (HOSPITAL_COMMUNITY)
Admission: RE | Admit: 2016-11-03 | Discharge: 2016-11-03 | Disposition: A | Discharge status: Home / Self Care | Payer: Medicare Other | Source: Ambulatory Visit | Attending: Nephrology | Admitting: Nephrology

## 2016-11-03 DIAGNOSIS — N289 Disorder of kidney and ureter, unspecified: Secondary | ICD-10-CM

## 2016-11-03 DIAGNOSIS — D631 Anemia in chronic kidney disease: Secondary | ICD-10-CM | POA: Diagnosis not present

## 2016-11-03 DIAGNOSIS — N183 Chronic kidney disease, stage 3 (moderate): Secondary | ICD-10-CM | POA: Insufficient documentation

## 2016-11-03 LAB — IRON AND TIBC
IRON: 85 ug/dL (ref 45–182)
Saturation Ratios: 27 % (ref 17.9–39.5)
TIBC: 312 ug/dL (ref 250–450)
UIBC: 227 ug/dL

## 2016-11-03 LAB — POCT HEMOGLOBIN-HEMACUE: Hemoglobin: 12 g/dL — ABNORMAL LOW (ref 13.0–17.0)

## 2016-11-03 LAB — FERRITIN: Ferritin: 1455 ng/mL — ABNORMAL HIGH (ref 24–336)

## 2016-11-03 MED ORDER — EPOETIN ALFA 20000 UNIT/ML IJ SOLN: 20000.0000 [IU] | INTRAMUSCULAR | Status: DC

## 2016-11-06 DIAGNOSIS — H353130 Nonexudative age-related macular degeneration, bilateral, stage unspecified: Secondary | ICD-10-CM | POA: Diagnosis not present

## 2016-11-06 DIAGNOSIS — Z961 Presence of intraocular lens: Secondary | ICD-10-CM | POA: Diagnosis not present

## 2016-11-06 DIAGNOSIS — E119 Type 2 diabetes mellitus without complications: Secondary | ICD-10-CM | POA: Diagnosis not present

## 2016-11-06 DIAGNOSIS — H35033 Hypertensive retinopathy, bilateral: Secondary | ICD-10-CM | POA: Diagnosis not present

## 2016-11-16 ENCOUNTER — Other Ambulatory Visit (HOSPITAL_COMMUNITY): Payer: Self-pay | Admitting: *Deleted

## 2016-11-17 ENCOUNTER — Ambulatory Visit (HOSPITAL_COMMUNITY)
Admission: RE | Admit: 2016-11-17 | Discharge: 2016-11-17 | Disposition: A | Discharge status: Home / Self Care | Payer: Medicare Other | Source: Ambulatory Visit | Attending: Nephrology | Admitting: Nephrology

## 2016-11-17 DIAGNOSIS — Z5181 Encounter for therapeutic drug level monitoring: Secondary | ICD-10-CM | POA: Insufficient documentation

## 2016-11-17 DIAGNOSIS — N289 Disorder of kidney and ureter, unspecified: Secondary | ICD-10-CM

## 2016-11-17 DIAGNOSIS — N183 Chronic kidney disease, stage 3 (moderate): Secondary | ICD-10-CM | POA: Insufficient documentation

## 2016-11-17 DIAGNOSIS — Z79899 Other long term (current) drug therapy: Secondary | ICD-10-CM | POA: Insufficient documentation

## 2016-11-17 DIAGNOSIS — D631 Anemia in chronic kidney disease: Secondary | ICD-10-CM | POA: Diagnosis not present

## 2016-11-17 LAB — POCT HEMOGLOBIN-HEMACUE: Hemoglobin: 11.5 g/dL — ABNORMAL LOW (ref 13.0–17.0)

## 2016-11-17 MED ORDER — EPOETIN ALFA 20000 UNIT/ML IJ SOLN
20000.0000 [IU] | INTRAMUSCULAR | Status: DC
  Administered 2016-11-17: 13:00:00 20000 [IU] via SUBCUTANEOUS

## 2016-11-17 MED ORDER — SODIUM CHLORIDE 0.9 % IV SOLN
510.0000 mg | Freq: Once | INTRAVENOUS | Status: AC
  Administered 2016-11-17: 510 mg via INTRAVENOUS
  Filled 2016-11-17: qty 17

## 2016-11-17 MED ORDER — EPOETIN ALFA 20000 UNIT/ML IJ SOLN
INTRAMUSCULAR | Status: AC
  Filled 2016-11-17: qty 1

## 2016-12-01 ENCOUNTER — Encounter (HOSPITAL_COMMUNITY)
Admission: RE | Admit: 2016-12-01 | Discharge: 2016-12-01 | Disposition: A | Discharge status: Home / Self Care | Payer: Medicare Other | Source: Ambulatory Visit | Attending: Nephrology | Admitting: Nephrology

## 2016-12-01 DIAGNOSIS — N183 Chronic kidney disease, stage 3 (moderate): Secondary | ICD-10-CM | POA: Insufficient documentation

## 2016-12-01 DIAGNOSIS — D631 Anemia in chronic kidney disease: Secondary | ICD-10-CM | POA: Diagnosis not present

## 2016-12-01 DIAGNOSIS — N289 Disorder of kidney and ureter, unspecified: Secondary | ICD-10-CM

## 2016-12-01 LAB — FERRITIN: Ferritin: 1612 ng/mL — ABNORMAL HIGH (ref 24–336)

## 2016-12-01 LAB — IRON AND TIBC
Iron: 89 ug/dL (ref 45–182)
Saturation Ratios: 29 % (ref 17.9–39.5)
TIBC: 308 ug/dL (ref 250–450)
UIBC: 219 ug/dL

## 2016-12-01 LAB — POCT HEMOGLOBIN-HEMACUE: Hemoglobin: 11.9 g/dL — ABNORMAL LOW (ref 13.0–17.0)

## 2016-12-01 MED ORDER — EPOETIN ALFA 20000 UNIT/ML IJ SOLN
20000.0000 [IU] | INTRAMUSCULAR | Status: DC
  Administered 2016-12-01: 20000 [IU] via SUBCUTANEOUS

## 2016-12-01 MED ORDER — EPOETIN ALFA 20000 UNIT/ML IJ SOLN
INTRAMUSCULAR | Status: AC
  Filled 2016-12-01: qty 1

## 2016-12-15 ENCOUNTER — Encounter (HOSPITAL_COMMUNITY)
Admission: RE | Admit: 2016-12-15 | Discharge: 2016-12-15 | Disposition: A | Discharge status: Home / Self Care | Payer: Medicare Other | Source: Ambulatory Visit | Attending: Nephrology | Admitting: Nephrology

## 2016-12-15 DIAGNOSIS — D631 Anemia in chronic kidney disease: Secondary | ICD-10-CM | POA: Diagnosis not present

## 2016-12-15 DIAGNOSIS — N183 Chronic kidney disease, stage 3 (moderate): Secondary | ICD-10-CM | POA: Diagnosis not present

## 2016-12-15 DIAGNOSIS — N289 Disorder of kidney and ureter, unspecified: Secondary | ICD-10-CM

## 2016-12-15 LAB — POCT HEMOGLOBIN-HEMACUE: Hemoglobin: 12.3 g/dL — ABNORMAL LOW (ref 13.0–17.0)

## 2016-12-15 MED ORDER — EPOETIN ALFA 20000 UNIT/ML IJ SOLN: 20000.0000 [IU] | INTRAMUSCULAR | Status: DC

## 2016-12-29 ENCOUNTER — Encounter (HOSPITAL_COMMUNITY)
Admission: RE | Admit: 2016-12-29 | Discharge: 2016-12-29 | Disposition: A | Discharge status: Home / Self Care | Payer: Medicare Other | Source: Ambulatory Visit | Attending: Nephrology | Admitting: Nephrology

## 2016-12-29 DIAGNOSIS — D631 Anemia in chronic kidney disease: Secondary | ICD-10-CM | POA: Insufficient documentation

## 2016-12-29 DIAGNOSIS — N289 Disorder of kidney and ureter, unspecified: Secondary | ICD-10-CM

## 2016-12-29 DIAGNOSIS — N183 Chronic kidney disease, stage 3 (moderate): Secondary | ICD-10-CM | POA: Insufficient documentation

## 2016-12-29 LAB — IRON AND TIBC
IRON: 102 ug/dL (ref 45–182)
Saturation Ratios: 35 % (ref 17.9–39.5)
TIBC: 295 ug/dL (ref 250–450)
UIBC: 193 ug/dL

## 2016-12-29 LAB — FERRITIN: Ferritin: 1502 ng/mL — ABNORMAL HIGH (ref 24–336)

## 2016-12-29 LAB — POCT HEMOGLOBIN-HEMACUE: HEMOGLOBIN: 11.4 g/dL — AB (ref 13.0–17.0)

## 2016-12-29 MED ORDER — EPOETIN ALFA 20000 UNIT/ML IJ SOLN
INTRAMUSCULAR | Status: AC
  Administered 2016-12-29: 11:00:00 20000 [IU] via SUBCUTANEOUS
  Filled 2016-12-29: qty 1

## 2016-12-29 MED ORDER — EPOETIN ALFA 20000 UNIT/ML IJ SOLN: 20000.0000 [IU] | INTRAMUSCULAR | Status: DC

## 2017-01-04 DIAGNOSIS — N2589 Other disorders resulting from impaired renal tubular function: Secondary | ICD-10-CM | POA: Diagnosis not present

## 2017-01-04 DIAGNOSIS — M109 Gout, unspecified: Secondary | ICD-10-CM | POA: Diagnosis not present

## 2017-01-04 DIAGNOSIS — N183 Chronic kidney disease, stage 3 (moderate): Secondary | ICD-10-CM | POA: Diagnosis not present

## 2017-01-04 DIAGNOSIS — N2581 Secondary hyperparathyroidism of renal origin: Secondary | ICD-10-CM | POA: Diagnosis not present

## 2017-01-04 DIAGNOSIS — Z720 Tobacco use: Secondary | ICD-10-CM | POA: Diagnosis not present

## 2017-01-04 DIAGNOSIS — I129 Hypertensive chronic kidney disease with stage 1 through stage 4 chronic kidney disease, or unspecified chronic kidney disease: Secondary | ICD-10-CM | POA: Diagnosis not present

## 2017-01-04 DIAGNOSIS — D631 Anemia in chronic kidney disease: Secondary | ICD-10-CM | POA: Diagnosis not present

## 2017-01-04 DIAGNOSIS — Z6829 Body mass index (BMI) 29.0-29.9, adult: Secondary | ICD-10-CM | POA: Diagnosis not present

## 2017-01-04 DIAGNOSIS — E785 Hyperlipidemia, unspecified: Secondary | ICD-10-CM | POA: Diagnosis not present

## 2017-01-04 DIAGNOSIS — N400 Enlarged prostate without lower urinary tract symptoms: Secondary | ICD-10-CM | POA: Diagnosis not present

## 2017-01-04 DIAGNOSIS — K219 Gastro-esophageal reflux disease without esophagitis: Secondary | ICD-10-CM | POA: Diagnosis not present

## 2017-01-04 DIAGNOSIS — E1129 Type 2 diabetes mellitus with other diabetic kidney complication: Secondary | ICD-10-CM | POA: Diagnosis not present

## 2017-01-12 ENCOUNTER — Encounter (HOSPITAL_COMMUNITY)
Admission: RE | Admit: 2017-01-12 | Discharge: 2017-01-12 | Disposition: A | Discharge status: Home / Self Care | Payer: Medicare Other | Source: Ambulatory Visit | Attending: Nephrology | Admitting: Nephrology

## 2017-01-12 DIAGNOSIS — D631 Anemia in chronic kidney disease: Secondary | ICD-10-CM | POA: Diagnosis not present

## 2017-01-12 DIAGNOSIS — N289 Disorder of kidney and ureter, unspecified: Secondary | ICD-10-CM

## 2017-01-12 DIAGNOSIS — N183 Chronic kidney disease, stage 3 (moderate): Secondary | ICD-10-CM | POA: Diagnosis not present

## 2017-01-12 LAB — POCT HEMOGLOBIN-HEMACUE: HEMOGLOBIN: 11.3 g/dL — AB (ref 13.0–17.0)

## 2017-01-12 MED ORDER — EPOETIN ALFA 20000 UNIT/ML IJ SOLN
20000.0000 [IU] | INTRAMUSCULAR | Status: DC
  Administered 2017-01-12: 11:00:00 20000 [IU] via SUBCUTANEOUS

## 2017-01-19 DIAGNOSIS — I129 Hypertensive chronic kidney disease with stage 1 through stage 4 chronic kidney disease, or unspecified chronic kidney disease: Secondary | ICD-10-CM | POA: Diagnosis not present

## 2017-01-26 ENCOUNTER — Encounter (HOSPITAL_COMMUNITY)
Admission: RE | Admit: 2017-01-26 | Discharge: 2017-01-26 | Disposition: A | Discharge status: Home / Self Care | Payer: Medicare Other | Source: Ambulatory Visit | Attending: Nephrology | Admitting: Nephrology

## 2017-01-26 DIAGNOSIS — N183 Chronic kidney disease, stage 3 (moderate): Secondary | ICD-10-CM | POA: Insufficient documentation

## 2017-01-26 DIAGNOSIS — N289 Disorder of kidney and ureter, unspecified: Secondary | ICD-10-CM

## 2017-01-26 DIAGNOSIS — D631 Anemia in chronic kidney disease: Secondary | ICD-10-CM | POA: Diagnosis not present

## 2017-01-26 LAB — IRON AND TIBC
IRON: 74 ug/dL (ref 45–182)
SATURATION RATIOS: 24 % (ref 17.9–39.5)
TIBC: 308 ug/dL (ref 250–450)
UIBC: 234 ug/dL

## 2017-01-26 LAB — FERRITIN: FERRITIN: 1466 ng/mL — AB (ref 24–336)

## 2017-01-26 LAB — POCT HEMOGLOBIN-HEMACUE: Hemoglobin: 11.9 g/dL — ABNORMAL LOW (ref 13.0–17.0)

## 2017-01-26 MED ORDER — EPOETIN ALFA 20000 UNIT/ML IJ SOLN: 20000.0000 [IU] | INTRAMUSCULAR | Status: DC

## 2017-01-26 MED ORDER — EPOETIN ALFA 20000 UNIT/ML IJ SOLN
INTRAMUSCULAR | Status: AC
  Administered 2017-01-26: 20000 [IU] via SUBCUTANEOUS
  Filled 2017-01-26: qty 1

## 2017-02-08 ENCOUNTER — Other Ambulatory Visit (HOSPITAL_COMMUNITY): Payer: Self-pay | Admitting: *Deleted

## 2017-02-09 ENCOUNTER — Encounter (HOSPITAL_COMMUNITY)
Admission: RE | Admit: 2017-02-09 | Discharge: 2017-02-09 | Disposition: A | Discharge status: Home / Self Care | Payer: Medicare Other | Source: Ambulatory Visit | Attending: Nephrology | Admitting: Nephrology

## 2017-02-09 DIAGNOSIS — N289 Disorder of kidney and ureter, unspecified: Secondary | ICD-10-CM

## 2017-02-09 DIAGNOSIS — N183 Chronic kidney disease, stage 3 (moderate): Secondary | ICD-10-CM | POA: Diagnosis not present

## 2017-02-09 DIAGNOSIS — D631 Anemia in chronic kidney disease: Secondary | ICD-10-CM | POA: Diagnosis not present

## 2017-02-09 LAB — POCT HEMOGLOBIN-HEMACUE: HEMOGLOBIN: 11 g/dL — AB (ref 13.0–17.0)

## 2017-02-09 MED ORDER — EPOETIN ALFA 20000 UNIT/ML IJ SOLN
20000.0000 [IU] | INTRAMUSCULAR | Status: DC
  Administered 2017-02-09: 10:00:00 20000 [IU] via SUBCUTANEOUS

## 2017-02-09 MED ORDER — EPOETIN ALFA 20000 UNIT/ML IJ SOLN
INTRAMUSCULAR | Status: AC
  Filled 2017-02-09: qty 1

## 2017-02-23 ENCOUNTER — Encounter (HOSPITAL_COMMUNITY)
Admission: RE | Admit: 2017-02-23 | Discharge: 2017-02-23 | Disposition: A | Discharge status: Home / Self Care | Payer: Medicare Other | Source: Ambulatory Visit | Attending: Nephrology | Admitting: Nephrology

## 2017-02-23 DIAGNOSIS — D631 Anemia in chronic kidney disease: Secondary | ICD-10-CM | POA: Insufficient documentation

## 2017-02-23 DIAGNOSIS — N183 Chronic kidney disease, stage 3 (moderate): Secondary | ICD-10-CM | POA: Insufficient documentation

## 2017-02-23 DIAGNOSIS — N289 Disorder of kidney and ureter, unspecified: Secondary | ICD-10-CM

## 2017-02-23 LAB — IRON AND TIBC
Iron: 93 ug/dL (ref 45–182)
SATURATION RATIOS: 31 % (ref 17.9–39.5)
TIBC: 300 ug/dL (ref 250–450)
UIBC: 207 ug/dL

## 2017-02-23 LAB — FERRITIN: FERRITIN: 1379 ng/mL — AB (ref 24–336)

## 2017-02-23 LAB — POCT HEMOGLOBIN-HEMACUE: HEMOGLOBIN: 11.1 g/dL — AB (ref 13.0–17.0)

## 2017-02-23 MED ORDER — EPOETIN ALFA 20000 UNIT/ML IJ SOLN
INTRAMUSCULAR | Status: AC
  Filled 2017-02-23: qty 1

## 2017-02-23 MED ORDER — EPOETIN ALFA 20000 UNIT/ML IJ SOLN
20000.0000 [IU] | INTRAMUSCULAR | Status: DC
Start: 2017-02-23 — End: 2017-02-24
  Administered 2017-02-23: 10:00:00 20000 [IU] via SUBCUTANEOUS

## 2017-03-09 ENCOUNTER — Encounter (HOSPITAL_COMMUNITY)
Admission: RE | Admit: 2017-03-09 | Discharge: 2017-03-09 | Disposition: A | Discharge status: Home / Self Care | Payer: Medicare Other | Source: Ambulatory Visit | Attending: Nephrology | Admitting: Nephrology

## 2017-03-09 DIAGNOSIS — N289 Disorder of kidney and ureter, unspecified: Secondary | ICD-10-CM

## 2017-03-09 DIAGNOSIS — N183 Chronic kidney disease, stage 3 (moderate): Secondary | ICD-10-CM | POA: Diagnosis not present

## 2017-03-09 DIAGNOSIS — D631 Anemia in chronic kidney disease: Secondary | ICD-10-CM | POA: Diagnosis not present

## 2017-03-09 LAB — POCT HEMOGLOBIN-HEMACUE: HEMOGLOBIN: 10.7 g/dL — AB (ref 13.0–17.0)

## 2017-03-09 MED ORDER — EPOETIN ALFA 20000 UNIT/ML IJ SOLN
20000.0000 [IU] | INTRAMUSCULAR | Status: DC
  Administered 2017-03-09: 11:00:00 20000 [IU] via SUBCUTANEOUS

## 2017-03-09 MED ORDER — EPOETIN ALFA 20000 UNIT/ML IJ SOLN
INTRAMUSCULAR | Status: AC
  Filled 2017-03-09: qty 1

## 2017-03-22 ENCOUNTER — Other Ambulatory Visit: Payer: Self-pay

## 2017-03-23 ENCOUNTER — Encounter (HOSPITAL_COMMUNITY)
Admission: RE | Admit: 2017-03-23 | Discharge: 2017-03-23 | Disposition: A | Discharge status: Home / Self Care | Payer: Medicare Other | Source: Ambulatory Visit | Attending: Nephrology | Admitting: Nephrology

## 2017-03-23 DIAGNOSIS — N289 Disorder of kidney and ureter, unspecified: Secondary | ICD-10-CM

## 2017-03-23 DIAGNOSIS — D631 Anemia in chronic kidney disease: Secondary | ICD-10-CM | POA: Diagnosis not present

## 2017-03-23 DIAGNOSIS — N183 Chronic kidney disease, stage 3 (moderate): Secondary | ICD-10-CM | POA: Diagnosis not present

## 2017-03-23 LAB — POCT HEMOGLOBIN-HEMACUE: Hemoglobin: 11.4 g/dL — ABNORMAL LOW (ref 13.0–17.0)

## 2017-03-23 MED ORDER — EPOETIN ALFA 20000 UNIT/ML IJ SOLN
20000.0000 [IU] | INTRAMUSCULAR | Status: DC
  Administered 2017-03-23: 11:00:00 20000 [IU] via SUBCUTANEOUS

## 2017-03-23 MED ORDER — EPOETIN ALFA 20000 UNIT/ML IJ SOLN
INTRAMUSCULAR | Status: AC
  Filled 2017-03-23: qty 1

## 2017-04-06 ENCOUNTER — Encounter (HOSPITAL_COMMUNITY)
Admission: RE | Admit: 2017-04-06 | Discharge: 2017-04-06 | Disposition: A | Discharge status: Home / Self Care | Payer: Medicare Other | Source: Ambulatory Visit | Attending: Nephrology | Admitting: Nephrology

## 2017-04-06 DIAGNOSIS — Z833 Family history of diabetes mellitus: Secondary | ICD-10-CM | POA: Diagnosis not present

## 2017-04-06 DIAGNOSIS — N08 Glomerular disorders in diseases classified elsewhere: Secondary | ICD-10-CM | POA: Diagnosis not present

## 2017-04-06 DIAGNOSIS — N183 Chronic kidney disease, stage 3 (moderate): Secondary | ICD-10-CM | POA: Diagnosis not present

## 2017-04-06 DIAGNOSIS — E1122 Type 2 diabetes mellitus with diabetic chronic kidney disease: Secondary | ICD-10-CM | POA: Diagnosis not present

## 2017-04-06 DIAGNOSIS — Z1389 Encounter for screening for other disorder: Secondary | ICD-10-CM | POA: Diagnosis not present

## 2017-04-06 DIAGNOSIS — K625 Hemorrhage of anus and rectum: Secondary | ICD-10-CM | POA: Diagnosis not present

## 2017-04-06 DIAGNOSIS — D631 Anemia in chronic kidney disease: Secondary | ICD-10-CM | POA: Diagnosis not present

## 2017-04-06 DIAGNOSIS — N289 Disorder of kidney and ureter, unspecified: Secondary | ICD-10-CM

## 2017-04-06 DIAGNOSIS — I129 Hypertensive chronic kidney disease with stage 1 through stage 4 chronic kidney disease, or unspecified chronic kidney disease: Secondary | ICD-10-CM | POA: Diagnosis not present

## 2017-04-06 LAB — IRON AND TIBC
IRON: 60 ug/dL (ref 45–182)
SATURATION RATIOS: 22 % (ref 17.9–39.5)
TIBC: 270 ug/dL (ref 250–450)
UIBC: 210 ug/dL

## 2017-04-06 LAB — POCT HEMOGLOBIN-HEMACUE: Hemoglobin: 9 g/dL — ABNORMAL LOW (ref 13.0–17.0)

## 2017-04-06 LAB — FERRITIN: Ferritin: 1612 ng/mL — ABNORMAL HIGH (ref 24–336)

## 2017-04-06 MED ORDER — EPOETIN ALFA 20000 UNIT/ML IJ SOLN
20000.0000 [IU] | INTRAMUSCULAR | Status: DC
  Administered 2017-04-06: 20000 [IU] via SUBCUTANEOUS

## 2017-04-06 MED ORDER — EPOETIN ALFA 20000 UNIT/ML IJ SOLN
INTRAMUSCULAR | Status: AC
  Administered 2017-04-06: 09:00:00 20000 [IU] via SUBCUTANEOUS
  Filled 2017-04-06: qty 1

## 2017-04-06 NOTE — Progress Notes (Signed)
Patient's hemocue today was 9.0, a significant drop since last visit two weeks ago. Pt states he feels "bad" but denies chest pain.  Pt stated he "saw bright red blood in his stool this past Saturday that cleared up Sunday" and that he is "leaving this appt to go to see PCP, Dr Baird Cancer".  Called and reported all of the above to [place] [facility].  No new orders received.  I advised the patient that if he is feeling bad or begins to feel worse, or sees anymore bleeding to go to the ER and the patient verbalized understanding.

## 2017-04-19 ENCOUNTER — Other Ambulatory Visit (HOSPITAL_COMMUNITY): Payer: Self-pay | Admitting: *Deleted

## 2017-04-20 ENCOUNTER — Encounter (HOSPITAL_COMMUNITY)
Admission: RE | Admit: 2017-04-20 | Discharge: 2017-04-20 | Disposition: A | Discharge status: Home / Self Care | Payer: Medicare Other | Source: Ambulatory Visit | Attending: Nephrology | Admitting: Nephrology

## 2017-04-20 DIAGNOSIS — N183 Chronic kidney disease, stage 3 (moderate): Secondary | ICD-10-CM | POA: Diagnosis not present

## 2017-04-20 DIAGNOSIS — N289 Disorder of kidney and ureter, unspecified: Secondary | ICD-10-CM

## 2017-04-20 DIAGNOSIS — D631 Anemia in chronic kidney disease: Secondary | ICD-10-CM | POA: Diagnosis not present

## 2017-04-20 LAB — POCT HEMOGLOBIN-HEMACUE: Hemoglobin: 9.5 g/dL — ABNORMAL LOW (ref 13.0–17.0)

## 2017-04-20 MED ORDER — EPOETIN ALFA 20000 UNIT/ML IJ SOLN
INTRAMUSCULAR | Status: AC
  Filled 2017-04-20: qty 1

## 2017-04-20 MED ORDER — EPOETIN ALFA 20000 UNIT/ML IJ SOLN
20000.0000 [IU] | INTRAMUSCULAR | Status: DC
  Administered 2017-04-20: 20000 [IU] via SUBCUTANEOUS

## 2017-05-03 DIAGNOSIS — R195 Other fecal abnormalities: Secondary | ICD-10-CM | POA: Diagnosis not present

## 2017-05-03 DIAGNOSIS — K625 Hemorrhage of anus and rectum: Secondary | ICD-10-CM | POA: Diagnosis not present

## 2017-05-03 DIAGNOSIS — D509 Iron deficiency anemia, unspecified: Secondary | ICD-10-CM | POA: Diagnosis not present

## 2017-05-03 DIAGNOSIS — N19 Unspecified kidney failure: Secondary | ICD-10-CM | POA: Diagnosis not present

## 2017-05-04 ENCOUNTER — Encounter (HOSPITAL_COMMUNITY)
Admission: RE | Admit: 2017-05-04 | Discharge: 2017-05-04 | Disposition: A | Discharge status: Home / Self Care | Payer: Medicare Other | Source: Ambulatory Visit | Attending: Nephrology | Admitting: Nephrology

## 2017-05-04 DIAGNOSIS — D631 Anemia in chronic kidney disease: Secondary | ICD-10-CM | POA: Insufficient documentation

## 2017-05-04 DIAGNOSIS — N183 Chronic kidney disease, stage 3 (moderate): Secondary | ICD-10-CM | POA: Insufficient documentation

## 2017-05-04 DIAGNOSIS — N289 Disorder of kidney and ureter, unspecified: Secondary | ICD-10-CM

## 2017-05-04 LAB — IRON AND TIBC
IRON: 62 ug/dL (ref 45–182)
Saturation Ratios: 19 % (ref 17.9–39.5)
TIBC: 330 ug/dL (ref 250–450)
UIBC: 268 ug/dL

## 2017-05-04 LAB — FERRITIN: FERRITIN: 1582 ng/mL — AB (ref 24–336)

## 2017-05-04 LAB — POCT HEMOGLOBIN-HEMACUE: HEMOGLOBIN: 11 g/dL — AB (ref 13.0–17.0)

## 2017-05-04 MED ORDER — EPOETIN ALFA 20000 UNIT/ML IJ SOLN
20000.0000 [IU] | INTRAMUSCULAR | Status: DC
  Administered 2017-05-04: 09:00:00 20000 [IU] via SUBCUTANEOUS

## 2017-05-04 MED ORDER — EPOETIN ALFA 20000 UNIT/ML IJ SOLN
INTRAMUSCULAR | Status: AC
  Filled 2017-05-04: qty 1

## 2017-05-14 ENCOUNTER — Other Ambulatory Visit: Payer: Self-pay | Admitting: Gastroenterology

## 2017-05-18 ENCOUNTER — Other Ambulatory Visit (HOSPITAL_COMMUNITY): Payer: Self-pay | Admitting: *Deleted

## 2017-05-19 ENCOUNTER — Ambulatory Visit (HOSPITAL_COMMUNITY): Admission: RE | Admit: 2017-05-19 | Payer: Medicare Other | Source: Ambulatory Visit

## 2017-05-27 ENCOUNTER — Encounter (HOSPITAL_COMMUNITY): Payer: Self-pay | Admitting: *Deleted

## 2017-05-28 ENCOUNTER — Encounter (HOSPITAL_COMMUNITY): Payer: Self-pay

## 2017-05-28 ENCOUNTER — Encounter (HOSPITAL_COMMUNITY)
Admission: RE | Disposition: A | Discharge status: Home / Self Care | Payer: Self-pay | Source: Ambulatory Visit | Attending: Gastroenterology

## 2017-05-28 ENCOUNTER — Ambulatory Visit (HOSPITAL_COMMUNITY)
Admission: RE | Admit: 2017-05-28 | Discharge: 2017-05-28 | Disposition: A | Discharge status: Home / Self Care | Payer: Medicare Other | Source: Ambulatory Visit | Attending: Gastroenterology | Admitting: Gastroenterology

## 2017-05-28 ENCOUNTER — Ambulatory Visit (HOSPITAL_COMMUNITY): Payer: Medicare Other | Admitting: Anesthesiology

## 2017-05-28 ENCOUNTER — Other Ambulatory Visit (HOSPITAL_COMMUNITY): Payer: Self-pay | Admitting: *Deleted

## 2017-05-28 DIAGNOSIS — D124 Benign neoplasm of descending colon: Secondary | ICD-10-CM | POA: Diagnosis not present

## 2017-05-28 DIAGNOSIS — I739 Peripheral vascular disease, unspecified: Secondary | ICD-10-CM | POA: Diagnosis not present

## 2017-05-28 DIAGNOSIS — K219 Gastro-esophageal reflux disease without esophagitis: Secondary | ICD-10-CM | POA: Insufficient documentation

## 2017-05-28 DIAGNOSIS — I8390 Asymptomatic varicose veins of unspecified lower extremity: Secondary | ICD-10-CM | POA: Diagnosis not present

## 2017-05-28 DIAGNOSIS — D126 Benign neoplasm of colon, unspecified: Secondary | ICD-10-CM | POA: Diagnosis not present

## 2017-05-28 DIAGNOSIS — D122 Benign neoplasm of ascending colon: Secondary | ICD-10-CM | POA: Insufficient documentation

## 2017-05-28 DIAGNOSIS — K625 Hemorrhage of anus and rectum: Secondary | ICD-10-CM | POA: Diagnosis not present

## 2017-05-28 DIAGNOSIS — K921 Melena: Secondary | ICD-10-CM | POA: Insufficient documentation

## 2017-05-28 DIAGNOSIS — F329 Major depressive disorder, single episode, unspecified: Secondary | ICD-10-CM | POA: Insufficient documentation

## 2017-05-28 DIAGNOSIS — M1990 Unspecified osteoarthritis, unspecified site: Secondary | ICD-10-CM | POA: Diagnosis not present

## 2017-05-28 DIAGNOSIS — Z800 Family history of malignant neoplasm of digestive organs: Secondary | ICD-10-CM | POA: Diagnosis not present

## 2017-05-28 DIAGNOSIS — K31819 Angiodysplasia of stomach and duodenum without bleeding: Secondary | ICD-10-CM | POA: Diagnosis not present

## 2017-05-28 DIAGNOSIS — D509 Iron deficiency anemia, unspecified: Secondary | ICD-10-CM | POA: Insufficient documentation

## 2017-05-28 DIAGNOSIS — K5730 Diverticulosis of large intestine without perforation or abscess without bleeding: Secondary | ICD-10-CM | POA: Insufficient documentation

## 2017-05-28 DIAGNOSIS — E1122 Type 2 diabetes mellitus with diabetic chronic kidney disease: Secondary | ICD-10-CM | POA: Insufficient documentation

## 2017-05-28 DIAGNOSIS — N189 Chronic kidney disease, unspecified: Secondary | ICD-10-CM | POA: Diagnosis not present

## 2017-05-28 DIAGNOSIS — I129 Hypertensive chronic kidney disease with stage 1 through stage 4 chronic kidney disease, or unspecified chronic kidney disease: Secondary | ICD-10-CM | POA: Insufficient documentation

## 2017-05-28 DIAGNOSIS — K558 Other vascular disorders of intestine: Secondary | ICD-10-CM | POA: Insufficient documentation

## 2017-05-28 DIAGNOSIS — Z9849 Cataract extraction status, unspecified eye: Secondary | ICD-10-CM | POA: Diagnosis not present

## 2017-05-28 DIAGNOSIS — I252 Old myocardial infarction: Secondary | ICD-10-CM | POA: Insufficient documentation

## 2017-05-28 DIAGNOSIS — D1779 Benign lipomatous neoplasm of other sites: Secondary | ICD-10-CM | POA: Insufficient documentation

## 2017-05-28 DIAGNOSIS — D123 Benign neoplasm of transverse colon: Secondary | ICD-10-CM | POA: Diagnosis not present

## 2017-05-28 DIAGNOSIS — M109 Gout, unspecified: Secondary | ICD-10-CM | POA: Diagnosis not present

## 2017-05-28 DIAGNOSIS — Z803 Family history of malignant neoplasm of breast: Secondary | ICD-10-CM | POA: Diagnosis not present

## 2017-05-28 DIAGNOSIS — Z794 Long term (current) use of insulin: Secondary | ICD-10-CM | POA: Insufficient documentation

## 2017-05-28 DIAGNOSIS — Z833 Family history of diabetes mellitus: Secondary | ICD-10-CM | POA: Insufficient documentation

## 2017-05-28 DIAGNOSIS — Z87891 Personal history of nicotine dependence: Secondary | ICD-10-CM | POA: Diagnosis not present

## 2017-05-28 DIAGNOSIS — Q2733 Arteriovenous malformation of digestive system vessel: Secondary | ICD-10-CM | POA: Diagnosis not present

## 2017-05-28 DIAGNOSIS — E119 Type 2 diabetes mellitus without complications: Secondary | ICD-10-CM | POA: Diagnosis not present

## 2017-05-28 DIAGNOSIS — F419 Anxiety disorder, unspecified: Secondary | ICD-10-CM | POA: Insufficient documentation

## 2017-05-28 HISTORY — DX: Anxiety disorder, unspecified: F41.9

## 2017-05-28 HISTORY — PX: ESOPHAGOGASTRODUODENOSCOPY (EGD) WITH PROPOFOL: SHX5813

## 2017-05-28 HISTORY — DX: Chronic kidney disease, unspecified: N18.9

## 2017-05-28 HISTORY — DX: Asymptomatic varicose veins of left lower extremity: I83.92

## 2017-05-28 HISTORY — DX: Major depressive disorder, single episode, unspecified: F32.9

## 2017-05-28 HISTORY — DX: Cardiac murmur, unspecified: R01.1

## 2017-05-28 HISTORY — PX: COLONOSCOPY WITH PROPOFOL: SHX5780

## 2017-05-28 HISTORY — DX: Gastro-esophageal reflux disease without esophagitis: K21.9

## 2017-05-28 HISTORY — DX: Unspecified osteoarthritis, unspecified site: M19.90

## 2017-05-28 HISTORY — DX: Anemia, unspecified: D64.9

## 2017-05-28 HISTORY — DX: Acute myocardial infarction, unspecified: I21.9

## 2017-05-28 HISTORY — DX: Depression, unspecified: F32.A

## 2017-05-28 LAB — GLUCOSE, CAPILLARY: GLUCOSE-CAPILLARY: 136 mg/dL — AB (ref 65–99)

## 2017-05-28 SURGERY — ESOPHAGOGASTRODUODENOSCOPY (EGD) WITH PROPOFOL
Anesthesia: Monitor Anesthesia Care

## 2017-05-28 MED ORDER — AMLODIPINE BESYLATE 5 MG PO TABS
5.0000 mg | ORAL_TABLET | Freq: Every day | ORAL | Status: DC
  Administered 2017-05-28: 5 mg via ORAL
  Filled 2017-05-28: qty 1

## 2017-05-28 MED ORDER — PROPOFOL 10 MG/ML IV BOLUS
INTRAVENOUS | Status: AC
  Filled 2017-05-28: qty 20

## 2017-05-28 MED ORDER — LACTATED RINGERS IV SOLN
INTRAVENOUS | Status: DC | PRN
  Administered 2017-05-28: 08:00:00 via INTRAVENOUS

## 2017-05-28 MED ORDER — CARVEDILOL 25 MG PO TABS
25.0000 mg | ORAL_TABLET | Freq: Once | ORAL | Status: AC
  Administered 2017-05-28: 25 mg via ORAL
  Filled 2017-05-28: qty 1

## 2017-05-28 MED ORDER — SODIUM CHLORIDE 0.9 % IV SOLN
INTRAVENOUS | Status: DC
Start: 2017-05-28 — End: 2017-05-28

## 2017-05-28 MED ORDER — PROPOFOL 10 MG/ML IV BOLUS
INTRAVENOUS | Status: DC | PRN
  Administered 2017-05-28: 40 mg via INTRAVENOUS
  Administered 2017-05-28 (×7): 20 mg via INTRAVENOUS
  Administered 2017-05-28: 10 mg via INTRAVENOUS
  Administered 2017-05-28 (×18): 20 mg via INTRAVENOUS
  Administered 2017-05-28: 10 mg via INTRAVENOUS
  Administered 2017-05-28 (×3): 20 mg via INTRAVENOUS

## 2017-05-28 MED ORDER — PROPOFOL 10 MG/ML IV BOLUS
INTRAVENOUS | Status: AC
Start: 2017-05-28 — End: ?
  Filled 2017-05-28: qty 40

## 2017-05-28 SURGICAL SUPPLY — 24 items

## 2017-05-28 NOTE — Op Note (Signed)
[facility] Patient Name: Alec Solis Procedure Date: 05/28/2017 MRN: [id number] Attending MD: Carol Ada , MD Date of Birth: 1933/02/04 CSN: [id number] Age: 83 Admit Type: Outpatient Procedure:                Colonoscopy Indications:              Hematochezia, Heme positive stool Providers:                Carol Ada, MD, Cleda Daub, RN, William Dalton, Technician Referring MD:              Medicines:                Propofol per Anesthesia Complications:            No immediate complications. Estimated Blood Loss:     Estimated blood loss was minimal. Procedure:                Pre-Anesthesia Assessment:                           - Prior to the procedure, a History and Physical                            was performed, and patient medications and                            allergies were reviewed. The patient's tolerance of                            previous anesthesia was also reviewed. The risks                            and benefits of the procedure and the sedation                            options and risks were discussed with the patient.                            All questions were answered, and informed consent                            was obtained. Prior Anticoagulants: The patient has                            taken Plavix (clopidogrel), last dose was 5 days                            prior to procedure. ASA Grade Assessment: III - A                            patient with severe systemic disease. After  reviewing the risks and benefits, the patient was                            deemed in satisfactory condition to undergo the                            procedure.                           - Sedation was administered by an anesthesia                            professional. Deep sedation was attained.                           After obtaining informed consent, the colonoscope               was passed under direct vision. Throughout the                            procedure, the patient's blood pressure, pulse, and                            oxygen saturations were monitored continuously. The                            EC-[id number] ([id number]) scope was introduced through                            the anus and advanced to the the cecum, identified                            by appendiceal orifice and ileocecal valve. The                            colonoscopy was performed without difficulty. The                            patient tolerated the procedure well. The quality                            of the bowel preparation was good. The ileocecal                            valve, appendiceal orifice, and rectum were                            photographed. Scope In: 9:09:28 AM Scope Out: 9:34:22 AM Scope Withdrawal Time: 0 hours 11 minutes 16 seconds  Total Procedure Duration: 0 hours 24 minutes 54 seconds  Findings:      Nine sessile polyps were found in the descending colon, transverse colon       and ascending colon. The polyps were 3 to 6 mm in size. These polyps       were removed with a cold snare. Resection and  retrieval were complete.      Scattered small and large-mouthed diverticula were found in the sigmoid       colon.      There was a medium-sized lipoma, in the ascending colon. Impression:               - Nine 3 to 6 mm polyps in the descending colon, in                            the transverse colon and in the ascending colon,                            removed with a cold snare. Resected and retrieved.                           - Diverticulosis in the sigmoid colon.                           - Medium-sized lipoma in the ascending colon. Moderate Sedation:      N/A- Per Anesthesia Care Recommendation:           - Patient has a contact number available for                            emergencies. The signs and symptoms of potential                             delayed complications were discussed with the                            patient. Return to normal activities tomorrow.                            Written discharge instructions were provided to the                            patient.                           - Resume previous diet.                           - Continue present medications.                           - Await pathology results.                           - Repeat colonoscopy in 3 years for surveillance,                            if it is clinically appropriate.                           - Return to GI clinic in 4 weeks.                           -  Restart Plavix. Procedure Code(s):        --- Professional ---                           [phone], Colonoscopy, flexible; with removal of                            tumor(s), polyp(s), or other lesion(s) by snare                            technique Diagnosis Code(s):        --- Professional ---                           D12.4, Benign neoplasm of descending colon                           D12.3, Benign neoplasm of transverse colon (hepatic                            flexure or splenic flexure)                           D12.2, Benign neoplasm of ascending colon                           D17.5, Benign lipomatous neoplasm of                            intra-abdominal organs                           K92.1, Melena (includes Hematochezia)                           R19.5, Other fecal abnormalities                           K57.30, Diverticulosis of large intestine without                            perforation or abscess without bleeding CPT copyright 2016 American Medical Association. All rights reserved. The codes documented in this report are preliminary and upon coder review may  be revised to meet current compliance requirements. Carol Ada, MD Carol Ada, MD 05/28/2017 9:41:36 AM This report has been signed electronically. Number of Addenda: 0

## 2017-05-28 NOTE — Op Note (Signed)
[facility] Patient Name: Alec Solis Procedure Date: 05/28/2017 MRN: [id number] Attending MD: Carol Ada , MD Date of Birth: 1932/09/18 CSN: [id number] Age: 83 Admit Type: Outpatient Procedure:                Small bowel enteroscopy Indications:              Iron deficiency anemia Providers:                Carol Ada, MD, Cleda Daub, RN, William Dalton, Technician Referring MD:              Medicines:                Propofol per Anesthesia Complications:            No immediate complications. Estimated Blood Loss:     Estimated blood loss: none. Procedure:                Pre-Anesthesia Assessment:                           - Prior to the procedure, a History and Physical                            was performed, and patient medications and                            allergies were reviewed. The patient's tolerance of                            previous anesthesia was also reviewed. The risks                            and benefits of the procedure and the sedation                            options and risks were discussed with the patient.                            All questions were answered, and informed consent                            was obtained. Prior Anticoagulants: The patient has                            taken Plavix (clopidogrel), last dose was 5 days                            prior to procedure. ASA Grade Assessment: III - A                            patient with severe systemic disease. After  reviewing the risks and benefits, the patient was                            deemed in satisfactory condition to undergo the                            procedure.                           After obtaining informed consent, the endoscope was                            passed under direct vision. Throughout the                            procedure, the patient's blood pressure, pulse, and                     oxygen saturations were monitored continuously. The                            EC-3490LI ([id number]) scope was introduced through                            the mouth, and advanced to the proximal jejunum.                            After obtaining informed consent, the endoscope was                            passed under direct vision. Throughout the                            procedure, the patient's blood pressure, pulse, and                            oxygen saturations were monitored continuously.The                            small bowel enteroscopy was accomplished without                            difficulty. The patient tolerated the procedure                            well. Scope In: Scope Out: Findings:      A single angiodysplastic lesion with no bleeding was found in the       proximal jejunum. Coagulation for hemostasis using monopolar probe was       successful. Estimated blood loss: none.      The esophagus was normal.      The stomach was normal.      The examined duodenum was normal. Impression:               - A single non-bleeding angiodysplastic lesion in  the jejunum. Treated with a monopolar probe.                           - Normal esophagus.                           - Normal stomach.                           - Normal examined duodenum.                           - No specimens collected. Recommendation:            Procedure Code(s):        --- Professional ---                           [phone], Small intestinal endoscopy, enteroscopy                            beyond second portion of duodenum, not including                            ileum; with control of bleeding (eg, injection,                            bipolar cautery, unipolar cautery, laser, heater                            probe, stapler, plasma coagulator) Diagnosis Code(s):        --- Professional ---                           U23.53, Angiodysplasia of  colon without hemorrhage                           D50.9, Iron deficiency anemia, unspecified CPT copyright 2016 American Medical Association. All rights reserved. The codes documented in this report are preliminary and upon coder review may  be revised to meet current compliance requirements. Carol Ada, MD Carol Ada, MD 05/28/2017 9:44:27 AM This report has been signed electronically. Number of Addenda: 0

## 2017-05-28 NOTE — Discharge Instructions (Signed)

## 2017-05-28 NOTE — Transfer of Care (Signed)
Immediate Anesthesia Transfer of Care Note  Patient: Alec Solis  Procedure(s) Performed: ESOPHAGOGASTRODUODENOSCOPY (EGD) WITH PROPOFOL (N/A ) COLONOSCOPY WITH PROPOFOL (N/A )  Patient Location: PACU  Anesthesia Type:MAC  Level of Consciousness: sedated  Airway & Oxygen Therapy: Patient Spontanous Breathing and Patient connected to nasal cannula oxygen  Post-op Assessment: Report given to RN and Post -op Vital signs reviewed and stable  Post vital signs: Reviewed and stable  Last Vitals:  Vitals:   05/28/17 0817 05/28/17 0939  BP: (!) 147/89   Pulse: 75 (P) 80  Resp: 11 (P) 20  Temp: 36.4 C   SpO2: 100%     Last Pain: There were no vitals filed for this visit.       Complications: No apparent anesthesia complications

## 2017-05-28 NOTE — Addendum Note (Signed)
Addendum  created 05/28/17 1206 by Annye Asa, MD   Sign clinical note

## 2017-05-28 NOTE — Anesthesia Postprocedure Evaluation (Addendum)
Anesthesia Post Note  Patient: Alec Solis  Procedure(s) Performed: ESOPHAGOGASTRODUODENOSCOPY (EGD) WITH PROPOFOL (N/A ) COLONOSCOPY WITH PROPOFOL (N/A )     Patient location during evaluation: Endoscopy Anesthesia Type: MAC Level of consciousness: awake and alert, patient cooperative and oriented Pain management: pain level controlled Vital Signs Assessment: post-procedure vital signs reviewed and stable Respiratory status: spontaneous breathing, nonlabored ventilation and respiratory function stable Cardiovascular status: blood pressure returned to baseline and stable Postop Assessment: no apparent nausea or vomiting Anesthetic complications: no    Last Vitals:  Vitals:   05/28/17 1005 05/28/17 1010  BP:  120/69  Pulse: 72 74  Resp: 15 15  Temp:    SpO2: 96% 97%    Last Pain:  Vitals:   05/28/17 0939  TempSrc: Oral                 Adalynd Donahoe,E. Arayla Kruschke

## 2017-05-28 NOTE — H&P (Signed)
Alec Solis HPI: The patient reports having hematochezia that persisted for two days 3 weeks ago. He had the urge to have a bowel movement and then he noticed that it was blood. The bleeding was painless and reports one prior episode a few years ago. The initial episode occurred in [place] and he underwent a colonoscopy with findings of a few polyps. A couple of weeks ago he did have two episode of nonbloody emesis and he does not know the reason for his vomiting. No complaints of chest pain or MI, but at times he can have SOB. GERD is a problem for him, even though he is taking a PPI.   Past Medical History:  Diagnosis Date  . Anemia   . Anxiety   . Arthritis   . Cataract   . Chronic kidney disease   . Depression   . Diabetes mellitus without complication ([place])    type II   . GERD (gastroesophageal reflux disease)   . Gout   . Heart murmur   . Hypertension   . Myocardial infarction ([place])    minor Mi- years ago -   . Varicose veins of left lower extremity     Past Surgical History:  Procedure Laterality Date  . BUNIONECTOMY    . cataract      Family History  Problem Relation Age of Onset  . Breast cancer Mother   . Stomach cancer Mother   . Diabetes Mellitus II Maternal Aunt     Social History:  reports that he has quit smoking. His smoking use included Cigarettes. He has a 12.50 pack-year smoking history. He has never used smokeless tobacco. He reports that he does not drink alcohol or use drugs.  Allergies: No Known Allergies  Medications:  Scheduled:  Continuous: . sodium chloride      No results found for this or any previous visit (from the past 24 hour(s)).   No results found.  ROS:  As stated above in the HPI otherwise negative.  Blood pressure (!) 147/89, pulse 75, temperature 97.6 F (36.4 C), resp. rate 11, height 6' (1.829 m), weight 90.7 kg (200 lb), SpO2 100 %.    PE: Gen: NAD, Alert and Oriented HEENT:  [place]/AT, EOMI Neck: Supple, no LAD Lungs:  CTA Bilaterally CV: RRR without M/G/R ABM: Soft, NTND, +BS Ext: No C/C/E  Assessment/Plan: 1) IDA   The patient was noted to be heme positive. He has a history of IDA secondary to his renal insufficiency. His last creatinine that I can view was at 1.65 from 06/13/2016. He states that he sees Dr. Jimmy Footman every two weeks. His HGB did drop down from a baseline level of 11.4 g/dL (03/23/2017) to 9.5 on 04/20/2017. I will perform an EGD/Colonoscopy for further evaluation of his hematochezia, drop in HGB, and heme positive stool. His last colonoscopy was in 2011 when he was living in [place]. I have discussed the risks of bleeding, infection, perforation, medication reactions, a 10% miss rate for a small colon cancer or polyp, and the risk of death. All questions were answered and the patient acknowledges these risks and wishes to proceed.    Omelia Marquart D 05/28/2017, 8:34 AM

## 2017-05-28 NOTE — Anesthesia Preprocedure Evaluation (Addendum)
Anesthesia Evaluation  Patient identified by MRN, date of birth, ID band Patient awake    Reviewed: Allergy & Precautions, NPO status , Patient's Chart, lab work & pertinent test results  History of Anesthesia Complications Negative for: history of anesthetic complications  Airway Mallampati: I  TM Distance: >3 FB Neck ROM: Full    Dental  (+) Edentulous Upper, Poor Dentition, Missing, Dental Advisory Given   Pulmonary former smoker,    breath sounds clear to auscultation       Cardiovascular hypertension, Pt. on medications and Pt. on home beta blockers (-) angina+ Peripheral Vascular Disease   Rhythm:Regular Rate:Normal     Neuro/Psych negative neurological ROS     GI/Hepatic Neg liver ROS, GERD  Medicated and Controlled,  Endo/Other  diabetes, Insulin Dependent  Renal/GU Renal InsufficiencyRenal disease     Musculoskeletal   Abdominal   Peds  Hematology   Anesthesia Other Findings   Reproductive/Obstetrics                            Anesthesia Physical Anesthesia Plan  ASA: III  Anesthesia Plan: MAC   Post-op Pain Management:    Induction: Intravenous  PONV Risk Score and Plan: 1 and Treatment may vary due to age or medical condition  Airway Management Planned: Natural Airway and Simple Face Mask  Additional Equipment:   Intra-op Plan:   Post-operative Plan:   Informed Consent: I have reviewed the patients History and Physical, chart, labs and discussed the procedure including the risks, benefits and alternatives for the proposed anesthesia with the patient or authorized representative who has indicated his/her understanding and acceptance.   Dental advisory given  Plan Discussed with: CRNA and Surgeon  Anesthesia Plan Comments: (Plan routine monitors, MAC)        Anesthesia Quick Evaluation

## 2017-05-31 ENCOUNTER — Ambulatory Visit (HOSPITAL_COMMUNITY): Admission: RE | Admit: 2017-05-31 | Payer: Medicare Other | Source: Ambulatory Visit

## 2017-05-31 ENCOUNTER — Encounter (HOSPITAL_COMMUNITY): Payer: Self-pay | Admitting: Gastroenterology

## 2017-06-02 DIAGNOSIS — Z23 Encounter for immunization: Secondary | ICD-10-CM | POA: Diagnosis not present

## 2017-06-03 DIAGNOSIS — H26493 Other secondary cataract, bilateral: Secondary | ICD-10-CM | POA: Diagnosis not present

## 2017-06-03 DIAGNOSIS — H353131 Nonexudative age-related macular degeneration, bilateral, early dry stage: Secondary | ICD-10-CM | POA: Diagnosis not present

## 2017-06-03 DIAGNOSIS — E119 Type 2 diabetes mellitus without complications: Secondary | ICD-10-CM | POA: Diagnosis not present

## 2017-06-07 ENCOUNTER — Encounter (HOSPITAL_COMMUNITY)
Admission: RE | Admit: 2017-06-07 | Discharge: 2017-06-07 | Disposition: A | Discharge status: Home / Self Care | Payer: Medicare Other | Source: Ambulatory Visit | Attending: Nephrology | Admitting: Nephrology

## 2017-06-07 DIAGNOSIS — N183 Chronic kidney disease, stage 3 (moderate): Secondary | ICD-10-CM | POA: Insufficient documentation

## 2017-06-07 DIAGNOSIS — D631 Anemia in chronic kidney disease: Secondary | ICD-10-CM | POA: Diagnosis not present

## 2017-06-07 DIAGNOSIS — N289 Disorder of kidney and ureter, unspecified: Secondary | ICD-10-CM

## 2017-06-07 LAB — POCT HEMOGLOBIN-HEMACUE: Hemoglobin: 10.2 g/dL — ABNORMAL LOW (ref 13.0–17.0)

## 2017-06-07 MED ORDER — EPOETIN ALFA 20000 UNIT/ML IJ SOLN
INTRAMUSCULAR | Status: AC
  Administered 2017-06-07: 08:00:00 20000 [IU] via SUBCUTANEOUS
  Filled 2017-06-07: qty 1

## 2017-06-07 MED ORDER — SODIUM CHLORIDE 0.9 % IV SOLN
510.0000 mg | Freq: Once | INTRAVENOUS | Status: AC
  Administered 2017-06-07: 08:00:00 510 mg via INTRAVENOUS
  Filled 2017-06-07: qty 17

## 2017-06-07 MED ORDER — EPOETIN ALFA 20000 UNIT/ML IJ SOLN
20000.0000 [IU] | INTRAMUSCULAR | Status: DC
  Administered 2017-06-07: 20000 [IU] via SUBCUTANEOUS

## 2017-06-15 DIAGNOSIS — N08 Glomerular disorders in diseases classified elsewhere: Secondary | ICD-10-CM | POA: Diagnosis not present

## 2017-06-15 DIAGNOSIS — E1122 Type 2 diabetes mellitus with diabetic chronic kidney disease: Secondary | ICD-10-CM | POA: Diagnosis not present

## 2017-06-15 DIAGNOSIS — I129 Hypertensive chronic kidney disease with stage 1 through stage 4 chronic kidney disease, or unspecified chronic kidney disease: Secondary | ICD-10-CM | POA: Diagnosis not present

## 2017-06-15 DIAGNOSIS — N183 Chronic kidney disease, stage 3 (moderate): Secondary | ICD-10-CM | POA: Diagnosis not present

## 2017-06-21 ENCOUNTER — Encounter (HOSPITAL_COMMUNITY)
Admission: RE | Admit: 2017-06-21 | Discharge: 2017-06-21 | Disposition: A | Discharge status: Home / Self Care | Payer: Medicare Other | Source: Ambulatory Visit | Attending: Nephrology | Admitting: Nephrology

## 2017-06-21 DIAGNOSIS — D631 Anemia in chronic kidney disease: Secondary | ICD-10-CM | POA: Diagnosis not present

## 2017-06-21 DIAGNOSIS — N289 Disorder of kidney and ureter, unspecified: Secondary | ICD-10-CM

## 2017-06-21 DIAGNOSIS — N183 Chronic kidney disease, stage 3 (moderate): Secondary | ICD-10-CM | POA: Diagnosis not present

## 2017-06-21 LAB — IRON AND TIBC
IRON: 84 ug/dL (ref 45–182)
Saturation Ratios: 27 % (ref 17.9–39.5)
TIBC: 316 ug/dL (ref 250–450)
UIBC: 232 ug/dL

## 2017-06-21 LAB — FERRITIN: Ferritin: 1780 ng/mL — ABNORMAL HIGH (ref 24–336)

## 2017-06-21 LAB — POCT HEMOGLOBIN-HEMACUE: Hemoglobin: 11.4 g/dL — ABNORMAL LOW (ref 13.0–17.0)

## 2017-06-21 MED ORDER — EPOETIN ALFA 20000 UNIT/ML IJ SOLN
INTRAMUSCULAR | Status: AC
  Administered 2017-06-21: 20000 [IU] via SUBCUTANEOUS
  Filled 2017-06-21: qty 1

## 2017-06-21 MED ORDER — EPOETIN ALFA 20000 UNIT/ML IJ SOLN
20000.0000 [IU] | INTRAMUSCULAR | Status: DC
Start: 2017-06-21 — End: 2017-06-22
  Administered 2017-06-21: 20000 [IU] via SUBCUTANEOUS

## 2017-07-02 ENCOUNTER — Other Ambulatory Visit (HOSPITAL_COMMUNITY): Payer: Self-pay | Admitting: *Deleted

## 2017-07-02 DIAGNOSIS — D631 Anemia in chronic kidney disease: Secondary | ICD-10-CM | POA: Diagnosis not present

## 2017-07-02 DIAGNOSIS — N400 Enlarged prostate without lower urinary tract symptoms: Secondary | ICD-10-CM | POA: Diagnosis not present

## 2017-07-02 DIAGNOSIS — N2581 Secondary hyperparathyroidism of renal origin: Secondary | ICD-10-CM | POA: Diagnosis not present

## 2017-07-02 DIAGNOSIS — Z6829 Body mass index (BMI) 29.0-29.9, adult: Secondary | ICD-10-CM | POA: Diagnosis not present

## 2017-07-02 DIAGNOSIS — E785 Hyperlipidemia, unspecified: Secondary | ICD-10-CM | POA: Diagnosis not present

## 2017-07-02 DIAGNOSIS — E1129 Type 2 diabetes mellitus with other diabetic kidney complication: Secondary | ICD-10-CM | POA: Diagnosis not present

## 2017-07-02 DIAGNOSIS — N183 Chronic kidney disease, stage 3 (moderate): Secondary | ICD-10-CM | POA: Diagnosis not present

## 2017-07-02 DIAGNOSIS — M109 Gout, unspecified: Secondary | ICD-10-CM | POA: Diagnosis not present

## 2017-07-02 DIAGNOSIS — Z720 Tobacco use: Secondary | ICD-10-CM | POA: Diagnosis not present

## 2017-07-02 DIAGNOSIS — K219 Gastro-esophageal reflux disease without esophagitis: Secondary | ICD-10-CM | POA: Diagnosis not present

## 2017-07-02 DIAGNOSIS — I129 Hypertensive chronic kidney disease with stage 1 through stage 4 chronic kidney disease, or unspecified chronic kidney disease: Secondary | ICD-10-CM | POA: Diagnosis not present

## 2017-07-02 DIAGNOSIS — N2589 Other disorders resulting from impaired renal tubular function: Secondary | ICD-10-CM | POA: Diagnosis not present

## 2017-07-05 ENCOUNTER — Encounter (HOSPITAL_COMMUNITY)
Admission: RE | Admit: 2017-07-05 | Discharge: 2017-07-05 | Disposition: A | Discharge status: Home / Self Care | Payer: Medicare Other | Source: Ambulatory Visit | Attending: Nephrology | Admitting: Nephrology

## 2017-07-05 VITALS — BP 118/60 | HR 41 | Temp 98.6°F | Resp 18

## 2017-07-05 DIAGNOSIS — D631 Anemia in chronic kidney disease: Secondary | ICD-10-CM | POA: Insufficient documentation

## 2017-07-05 DIAGNOSIS — H26492 Other secondary cataract, left eye: Secondary | ICD-10-CM | POA: Diagnosis not present

## 2017-07-05 DIAGNOSIS — N183 Chronic kidney disease, stage 3 (moderate): Secondary | ICD-10-CM | POA: Diagnosis not present

## 2017-07-05 DIAGNOSIS — N289 Disorder of kidney and ureter, unspecified: Secondary | ICD-10-CM

## 2017-07-05 LAB — POCT HEMOGLOBIN-HEMACUE: HEMOGLOBIN: 10.8 g/dL — AB (ref 13.0–17.0)

## 2017-07-05 MED ORDER — EPOETIN ALFA 20000 UNIT/ML IJ SOLN
20000.0000 [IU] | INTRAMUSCULAR | Status: DC
  Administered 2017-07-05: 20000 [IU] via SUBCUTANEOUS

## 2017-07-05 MED ORDER — EPOETIN ALFA 20000 UNIT/ML IJ SOLN
INTRAMUSCULAR | Status: AC
  Administered 2017-07-05: 10:00:00 20000 [IU] via SUBCUTANEOUS
  Filled 2017-07-05: qty 1

## 2017-07-05 MED ORDER — SODIUM CHLORIDE 0.9 % IV SOLN
510.0000 mg | Freq: Once | INTRAVENOUS | Status: AC
  Administered 2017-07-05: 510 mg via INTRAVENOUS
  Filled 2017-07-05: qty 17

## 2017-07-19 ENCOUNTER — Encounter (HOSPITAL_COMMUNITY)
Admission: RE | Admit: 2017-07-19 | Discharge: 2017-07-19 | Disposition: A | Discharge status: Home / Self Care | Payer: Medicare Other | Source: Ambulatory Visit | Attending: Nephrology | Admitting: Nephrology

## 2017-07-19 VITALS — BP 117/68 | HR 71 | Temp 98.1°F | Resp 20

## 2017-07-19 DIAGNOSIS — D631 Anemia in chronic kidney disease: Secondary | ICD-10-CM | POA: Diagnosis not present

## 2017-07-19 DIAGNOSIS — N183 Chronic kidney disease, stage 3 (moderate): Secondary | ICD-10-CM | POA: Diagnosis not present

## 2017-07-19 DIAGNOSIS — H26491 Other secondary cataract, right eye: Secondary | ICD-10-CM | POA: Diagnosis not present

## 2017-07-19 DIAGNOSIS — N289 Disorder of kidney and ureter, unspecified: Secondary | ICD-10-CM

## 2017-07-19 LAB — IRON AND TIBC
Iron: 85 ug/dL (ref 45–182)
SATURATION RATIOS: 29 % (ref 17.9–39.5)
TIBC: 298 ug/dL (ref 250–450)
UIBC: 213 ug/dL

## 2017-07-19 LAB — POCT HEMOGLOBIN-HEMACUE: HEMOGLOBIN: 10.7 g/dL — AB (ref 13.0–17.0)

## 2017-07-19 LAB — FERRITIN: FERRITIN: 1534 ng/mL — AB (ref 24–336)

## 2017-07-19 MED ORDER — EPOETIN ALFA 20000 UNIT/ML IJ SOLN
INTRAMUSCULAR | Status: AC
  Filled 2017-07-19: qty 1

## 2017-07-19 MED ORDER — EPOETIN ALFA 20000 UNIT/ML IJ SOLN
20000.0000 [IU] | INTRAMUSCULAR | Status: DC
  Administered 2017-07-19: 20000 [IU] via SUBCUTANEOUS

## 2017-08-02 ENCOUNTER — Encounter (HOSPITAL_COMMUNITY): Payer: Medicare Other

## 2017-08-05 ENCOUNTER — Other Ambulatory Visit (HOSPITAL_COMMUNITY): Payer: Self-pay | Admitting: *Deleted

## 2017-08-06 ENCOUNTER — Encounter (HOSPITAL_COMMUNITY)
Admission: RE | Admit: 2017-08-06 | Discharge: 2017-08-06 | Disposition: A | Discharge status: Home / Self Care | Payer: Medicare Other | Source: Ambulatory Visit | Attending: Nephrology | Admitting: Nephrology

## 2017-08-06 VITALS — BP 117/72 | HR 80 | Temp 98.2°F | Resp 20

## 2017-08-06 DIAGNOSIS — D631 Anemia in chronic kidney disease: Secondary | ICD-10-CM | POA: Diagnosis not present

## 2017-08-06 DIAGNOSIS — E1122 Type 2 diabetes mellitus with diabetic chronic kidney disease: Secondary | ICD-10-CM | POA: Diagnosis not present

## 2017-08-06 DIAGNOSIS — N289 Disorder of kidney and ureter, unspecified: Secondary | ICD-10-CM

## 2017-08-06 DIAGNOSIS — N183 Chronic kidney disease, stage 3 (moderate): Secondary | ICD-10-CM | POA: Insufficient documentation

## 2017-08-06 DIAGNOSIS — I129 Hypertensive chronic kidney disease with stage 1 through stage 4 chronic kidney disease, or unspecified chronic kidney disease: Secondary | ICD-10-CM | POA: Diagnosis not present

## 2017-08-06 DIAGNOSIS — N08 Glomerular disorders in diseases classified elsewhere: Secondary | ICD-10-CM | POA: Diagnosis not present

## 2017-08-06 DIAGNOSIS — E782 Mixed hyperlipidemia: Secondary | ICD-10-CM | POA: Diagnosis not present

## 2017-08-06 LAB — POCT HEMOGLOBIN-HEMACUE: HEMOGLOBIN: 11 g/dL — AB (ref 13.0–17.0)

## 2017-08-06 MED ORDER — EPOETIN ALFA 20000 UNIT/ML IJ SOLN
INTRAMUSCULAR | Status: AC
  Filled 2017-08-06: qty 1

## 2017-08-06 MED ORDER — EPOETIN ALFA 20000 UNIT/ML IJ SOLN
20000.0000 [IU] | INTRAMUSCULAR | Status: DC
  Administered 2017-08-06: 10:00:00 20000 [IU] via SUBCUTANEOUS

## 2017-08-20 ENCOUNTER — Encounter (HOSPITAL_COMMUNITY)
Admission: RE | Admit: 2017-08-20 | Discharge: 2017-08-20 | Disposition: A | Discharge status: Home / Self Care | Payer: Medicare Other | Source: Ambulatory Visit | Attending: Nephrology | Admitting: Nephrology

## 2017-08-20 VITALS — BP 114/73 | HR 79 | Resp 18

## 2017-08-20 DIAGNOSIS — N183 Chronic kidney disease, stage 3 (moderate): Secondary | ICD-10-CM | POA: Diagnosis not present

## 2017-08-20 DIAGNOSIS — N289 Disorder of kidney and ureter, unspecified: Secondary | ICD-10-CM

## 2017-08-20 DIAGNOSIS — D631 Anemia in chronic kidney disease: Secondary | ICD-10-CM | POA: Diagnosis not present

## 2017-08-20 LAB — POCT HEMOGLOBIN-HEMACUE: Hemoglobin: 11.3 g/dL — ABNORMAL LOW (ref 13.0–17.0)

## 2017-08-20 LAB — IRON AND TIBC
IRON: 93 ug/dL (ref 45–182)
SATURATION RATIOS: 31 % (ref 17.9–39.5)
TIBC: 301 ug/dL (ref 250–450)
UIBC: 208 ug/dL

## 2017-08-20 LAB — FERRITIN: FERRITIN: 1386 ng/mL — AB (ref 24–336)

## 2017-08-20 MED ORDER — EPOETIN ALFA 20000 UNIT/ML IJ SOLN
INTRAMUSCULAR | Status: AC
  Filled 2017-08-20: qty 1

## 2017-08-20 MED ORDER — EPOETIN ALFA 20000 UNIT/ML IJ SOLN
20000.0000 [IU] | INTRAMUSCULAR | Status: DC
  Administered 2017-08-20: 20000 [IU] via SUBCUTANEOUS

## 2017-09-03 ENCOUNTER — Encounter (HOSPITAL_COMMUNITY)
Admission: RE | Admit: 2017-09-03 | Discharge: 2017-09-03 | Disposition: A | Discharge status: Home / Self Care | Payer: Medicare Other | Source: Ambulatory Visit | Attending: Nephrology | Admitting: Nephrology

## 2017-09-03 VITALS — BP 131/69 | HR 75 | Temp 98.0°F | Resp 20

## 2017-09-03 DIAGNOSIS — N289 Disorder of kidney and ureter, unspecified: Secondary | ICD-10-CM

## 2017-09-03 DIAGNOSIS — N183 Chronic kidney disease, stage 3 (moderate): Secondary | ICD-10-CM | POA: Diagnosis not present

## 2017-09-03 DIAGNOSIS — D631 Anemia in chronic kidney disease: Secondary | ICD-10-CM | POA: Diagnosis not present

## 2017-09-03 LAB — POCT HEMOGLOBIN-HEMACUE: HEMOGLOBIN: 12 g/dL — AB (ref 13.0–17.0)

## 2017-09-03 MED ORDER — EPOETIN ALFA 20000 UNIT/ML IJ SOLN: 20000.0000 [IU] | INTRAMUSCULAR | Status: DC

## 2017-09-03 MED ORDER — EPOETIN ALFA 20000 UNIT/ML IJ SOLN
INTRAMUSCULAR | Status: AC
  Filled 2017-09-03: qty 1

## 2017-09-17 ENCOUNTER — Encounter (HOSPITAL_COMMUNITY)
Admission: RE | Admit: 2017-09-17 | Discharge: 2017-09-17 | Disposition: A | Discharge status: Home / Self Care | Payer: Medicare Other | Source: Ambulatory Visit | Attending: Nephrology | Admitting: Nephrology

## 2017-09-17 VITALS — BP 134/76 | HR 73 | Temp 97.7°F | Resp 16

## 2017-09-17 DIAGNOSIS — N183 Chronic kidney disease, stage 3 (moderate): Secondary | ICD-10-CM | POA: Diagnosis not present

## 2017-09-17 DIAGNOSIS — D631 Anemia in chronic kidney disease: Secondary | ICD-10-CM | POA: Diagnosis not present

## 2017-09-17 DIAGNOSIS — N289 Disorder of kidney and ureter, unspecified: Secondary | ICD-10-CM

## 2017-09-17 LAB — IRON AND TIBC
IRON: 100 ug/dL (ref 45–182)
SATURATION RATIOS: 31 % (ref 17.9–39.5)
TIBC: 318 ug/dL (ref 250–450)
UIBC: 218 ug/dL

## 2017-09-17 LAB — POCT HEMOGLOBIN-HEMACUE: Hemoglobin: 11.8 g/dL — ABNORMAL LOW (ref 13.0–17.0)

## 2017-09-17 LAB — FERRITIN: Ferritin: 1333 ng/mL — ABNORMAL HIGH (ref 24–336)

## 2017-09-17 MED ORDER — EPOETIN ALFA 20000 UNIT/ML IJ SOLN
INTRAMUSCULAR | Status: AC
  Filled 2017-09-17: qty 1

## 2017-09-17 MED ORDER — EPOETIN ALFA 20000 UNIT/ML IJ SOLN
20000.0000 [IU] | INTRAMUSCULAR | Status: DC
  Administered 2017-09-17: 20000 [IU] via SUBCUTANEOUS

## 2017-10-01 ENCOUNTER — Encounter (HOSPITAL_COMMUNITY)
Admission: RE | Admit: 2017-10-01 | Discharge: 2017-10-01 | Disposition: A | Discharge status: Home / Self Care | Payer: Medicare Other | Source: Ambulatory Visit | Attending: Nephrology | Admitting: Nephrology

## 2017-10-01 VITALS — BP 122/79 | HR 84 | Temp 98.0°F | Resp 20

## 2017-10-01 DIAGNOSIS — N289 Disorder of kidney and ureter, unspecified: Secondary | ICD-10-CM

## 2017-10-01 DIAGNOSIS — N183 Chronic kidney disease, stage 3 (moderate): Secondary | ICD-10-CM | POA: Insufficient documentation

## 2017-10-01 DIAGNOSIS — D631 Anemia in chronic kidney disease: Secondary | ICD-10-CM | POA: Insufficient documentation

## 2017-10-01 LAB — POCT HEMOGLOBIN-HEMACUE: HEMOGLOBIN: 12.2 g/dL — AB (ref 13.0–17.0)

## 2017-10-01 MED ORDER — EPOETIN ALFA 20000 UNIT/ML IJ SOLN: 20000.0000 [IU] | INTRAMUSCULAR | Status: DC

## 2017-10-15 ENCOUNTER — Encounter (HOSPITAL_COMMUNITY)
Admission: RE | Admit: 2017-10-15 | Discharge: 2017-10-15 | Disposition: A | Discharge status: Home / Self Care | Payer: Medicare Other | Source: Ambulatory Visit | Attending: Nephrology | Admitting: Nephrology

## 2017-10-15 VITALS — BP 123/58 | HR 86 | Temp 98.3°F | Resp 20

## 2017-10-15 DIAGNOSIS — N289 Disorder of kidney and ureter, unspecified: Secondary | ICD-10-CM

## 2017-10-15 DIAGNOSIS — D631 Anemia in chronic kidney disease: Secondary | ICD-10-CM | POA: Diagnosis not present

## 2017-10-15 DIAGNOSIS — N183 Chronic kidney disease, stage 3 (moderate): Secondary | ICD-10-CM | POA: Diagnosis not present

## 2017-10-15 LAB — FERRITIN: FERRITIN: 1397 ng/mL — AB (ref 24–336)

## 2017-10-15 LAB — IRON AND TIBC
Iron: 93 ug/dL (ref 45–182)
Saturation Ratios: 33 % (ref 17.9–39.5)
TIBC: 280 ug/dL (ref 250–450)
UIBC: 187 ug/dL

## 2017-10-15 LAB — POCT HEMOGLOBIN-HEMACUE: HEMOGLOBIN: 10.7 g/dL — AB (ref 13.0–17.0)

## 2017-10-15 MED ORDER — EPOETIN ALFA 20000 UNIT/ML IJ SOLN
INTRAMUSCULAR | Status: AC
  Filled 2017-10-15: qty 1

## 2017-10-15 MED ORDER — EPOETIN ALFA 20000 UNIT/ML IJ SOLN
20000.0000 [IU] | INTRAMUSCULAR | Status: DC
  Administered 2017-10-15: 09:00:00 20000 [IU] via SUBCUTANEOUS

## 2017-10-29 ENCOUNTER — Encounter (HOSPITAL_COMMUNITY)
Admission: RE | Admit: 2017-10-29 | Discharge: 2017-10-29 | Disposition: A | Discharge status: Home / Self Care | Payer: Medicare Other | Source: Ambulatory Visit | Attending: Nephrology | Admitting: Nephrology

## 2017-10-29 VITALS — BP 104/70 | HR 80 | Temp 98.3°F | Resp 20

## 2017-10-29 DIAGNOSIS — D631 Anemia in chronic kidney disease: Secondary | ICD-10-CM | POA: Insufficient documentation

## 2017-10-29 DIAGNOSIS — N183 Chronic kidney disease, stage 3 (moderate): Secondary | ICD-10-CM | POA: Insufficient documentation

## 2017-10-29 DIAGNOSIS — N289 Disorder of kidney and ureter, unspecified: Secondary | ICD-10-CM

## 2017-10-29 LAB — POCT HEMOGLOBIN-HEMACUE: Hemoglobin: 10.5 g/dL — ABNORMAL LOW (ref 13.0–17.0)

## 2017-10-29 MED ORDER — EPOETIN ALFA 20000 UNIT/ML IJ SOLN
INTRAMUSCULAR | Status: AC
  Filled 2017-10-29: qty 1

## 2017-10-29 MED ORDER — EPOETIN ALFA 20000 UNIT/ML IJ SOLN
20000.0000 [IU] | INTRAMUSCULAR | Status: DC
  Administered 2017-10-29: 10:00:00 20000 [IU] via SUBCUTANEOUS

## 2017-11-09 DIAGNOSIS — M889 Osteitis deformans of unspecified bone: Secondary | ICD-10-CM | POA: Diagnosis not present

## 2017-11-09 DIAGNOSIS — N08 Glomerular disorders in diseases classified elsewhere: Secondary | ICD-10-CM | POA: Diagnosis not present

## 2017-11-09 DIAGNOSIS — M109 Gout, unspecified: Secondary | ICD-10-CM | POA: Diagnosis not present

## 2017-11-09 DIAGNOSIS — I129 Hypertensive chronic kidney disease with stage 1 through stage 4 chronic kidney disease, or unspecified chronic kidney disease: Secondary | ICD-10-CM | POA: Diagnosis not present

## 2017-11-09 DIAGNOSIS — E785 Hyperlipidemia, unspecified: Secondary | ICD-10-CM | POA: Diagnosis not present

## 2017-11-09 DIAGNOSIS — N2581 Secondary hyperparathyroidism of renal origin: Secondary | ICD-10-CM | POA: Diagnosis not present

## 2017-11-09 DIAGNOSIS — N183 Chronic kidney disease, stage 3 (moderate): Secondary | ICD-10-CM | POA: Diagnosis not present

## 2017-11-09 DIAGNOSIS — K279 Peptic ulcer, site unspecified, unspecified as acute or chronic, without hemorrhage or perforation: Secondary | ICD-10-CM | POA: Diagnosis not present

## 2017-11-09 DIAGNOSIS — D631 Anemia in chronic kidney disease: Secondary | ICD-10-CM | POA: Diagnosis not present

## 2017-11-09 DIAGNOSIS — E1122 Type 2 diabetes mellitus with diabetic chronic kidney disease: Secondary | ICD-10-CM | POA: Diagnosis not present

## 2017-11-09 DIAGNOSIS — N2589 Other disorders resulting from impaired renal tubular function: Secondary | ICD-10-CM | POA: Diagnosis not present

## 2017-11-09 DIAGNOSIS — K219 Gastro-esophageal reflux disease without esophagitis: Secondary | ICD-10-CM | POA: Diagnosis not present

## 2017-11-09 DIAGNOSIS — N400 Enlarged prostate without lower urinary tract symptoms: Secondary | ICD-10-CM | POA: Diagnosis not present

## 2017-11-12 ENCOUNTER — Ambulatory Visit (HOSPITAL_COMMUNITY)
Admission: RE | Admit: 2017-11-12 | Discharge: 2017-11-12 | Disposition: A | Discharge status: Home / Self Care | Payer: Medicare Other | Source: Ambulatory Visit | Attending: Nephrology | Admitting: Nephrology

## 2017-11-12 VITALS — BP 119/63 | HR 40 | Temp 98.0°F | Resp 20

## 2017-11-12 DIAGNOSIS — D631 Anemia in chronic kidney disease: Secondary | ICD-10-CM | POA: Insufficient documentation

## 2017-11-12 DIAGNOSIS — N183 Chronic kidney disease, stage 3 (moderate): Secondary | ICD-10-CM | POA: Diagnosis not present

## 2017-11-12 DIAGNOSIS — N289 Disorder of kidney and ureter, unspecified: Secondary | ICD-10-CM

## 2017-11-12 LAB — IRON AND TIBC
Iron: 60 ug/dL (ref 45–182)
SATURATION RATIOS: 21 % (ref 17.9–39.5)
TIBC: 280 ug/dL (ref 250–450)
UIBC: 220 ug/dL

## 2017-11-12 LAB — POCT HEMOGLOBIN-HEMACUE: Hemoglobin: 10.6 g/dL — ABNORMAL LOW (ref 13.0–17.0)

## 2017-11-12 LAB — FERRITIN: FERRITIN: 1041 ng/mL — AB (ref 24–336)

## 2017-11-12 MED ORDER — EPOETIN ALFA 20000 UNIT/ML IJ SOLN
20000.0000 [IU] | INTRAMUSCULAR | Status: DC
  Administered 2017-11-12: 10:00:00 20000 [IU] via SUBCUTANEOUS

## 2017-11-12 MED ORDER — EPOETIN ALFA 20000 UNIT/ML IJ SOLN
INTRAMUSCULAR | Status: AC
  Filled 2017-11-12: qty 1

## 2017-11-15 DIAGNOSIS — H353131 Nonexudative age-related macular degeneration, bilateral, early dry stage: Secondary | ICD-10-CM | POA: Diagnosis not present

## 2017-11-15 DIAGNOSIS — H43813 Vitreous degeneration, bilateral: Secondary | ICD-10-CM | POA: Diagnosis not present

## 2017-11-15 DIAGNOSIS — H35033 Hypertensive retinopathy, bilateral: Secondary | ICD-10-CM | POA: Diagnosis not present

## 2017-11-15 DIAGNOSIS — E119 Type 2 diabetes mellitus without complications: Secondary | ICD-10-CM | POA: Diagnosis not present

## 2017-11-25 ENCOUNTER — Other Ambulatory Visit (HOSPITAL_COMMUNITY): Payer: Self-pay | Admitting: *Deleted

## 2017-11-26 ENCOUNTER — Ambulatory Visit (HOSPITAL_COMMUNITY)
Admission: RE | Admit: 2017-11-26 | Discharge: 2017-11-26 | Disposition: A | Discharge status: Home / Self Care | Payer: Medicare Other | Source: Ambulatory Visit | Attending: Nephrology | Admitting: Nephrology

## 2017-11-26 VITALS — BP 118/72 | HR 70 | Temp 98.6°F | Resp 20 | Ht 72.0 in | Wt 204.0 lb

## 2017-11-26 DIAGNOSIS — D631 Anemia in chronic kidney disease: Secondary | ICD-10-CM | POA: Insufficient documentation

## 2017-11-26 DIAGNOSIS — N183 Chronic kidney disease, stage 3 (moderate): Secondary | ICD-10-CM | POA: Insufficient documentation

## 2017-11-26 DIAGNOSIS — N289 Disorder of kidney and ureter, unspecified: Secondary | ICD-10-CM

## 2017-11-26 LAB — POCT HEMOGLOBIN-HEMACUE: Hemoglobin: 10.2 g/dL — ABNORMAL LOW (ref 13.0–17.0)

## 2017-11-26 MED ORDER — SODIUM CHLORIDE 0.9 % IV SOLN
510.0000 mg | Freq: Once | INTRAVENOUS | Status: AC
  Administered 2017-11-26: 11:00:00 510 mg via INTRAVENOUS
  Filled 2017-11-26: qty 17

## 2017-11-26 MED ORDER — EPOETIN ALFA 20000 UNIT/ML IJ SOLN
INTRAMUSCULAR | Status: AC
  Administered 2017-11-26: 20000 [IU] via SUBCUTANEOUS
  Filled 2017-11-26: qty 1

## 2017-11-26 MED ORDER — EPOETIN ALFA 20000 UNIT/ML IJ SOLN
20000.0000 [IU] | INTRAMUSCULAR | Status: DC
  Administered 2017-11-26: 20000 [IU] via SUBCUTANEOUS

## 2017-12-10 ENCOUNTER — Ambulatory Visit (HOSPITAL_COMMUNITY)
Admission: RE | Admit: 2017-12-10 | Discharge: 2017-12-10 | Disposition: A | Discharge status: Home / Self Care | Payer: Medicare Other | Source: Ambulatory Visit | Attending: Nephrology | Admitting: Nephrology

## 2017-12-10 VITALS — BP 114/77 | HR 74 | Temp 98.6°F | Resp 20 | Ht 72.0 in | Wt 202.0 lb

## 2017-12-10 DIAGNOSIS — D631 Anemia in chronic kidney disease: Secondary | ICD-10-CM | POA: Insufficient documentation

## 2017-12-10 DIAGNOSIS — N289 Disorder of kidney and ureter, unspecified: Secondary | ICD-10-CM

## 2017-12-10 LAB — POCT HEMOGLOBIN-HEMACUE: Hemoglobin: 10.2 g/dL — ABNORMAL LOW (ref 13.0–17.0)

## 2017-12-10 LAB — IRON AND TIBC
IRON: 81 ug/dL (ref 45–182)
Saturation Ratios: 25 % (ref 17.9–39.5)
TIBC: 319 ug/dL (ref 250–450)
UIBC: 238 ug/dL

## 2017-12-10 LAB — FERRITIN: FERRITIN: 1457 ng/mL — AB (ref 24–336)

## 2017-12-10 MED ORDER — EPOETIN ALFA 20000 UNIT/ML IJ SOLN
20000.0000 [IU] | INTRAMUSCULAR | Status: DC
  Administered 2017-12-10: 20000 [IU] via SUBCUTANEOUS

## 2017-12-10 MED ORDER — EPOETIN ALFA 20000 UNIT/ML IJ SOLN
INTRAMUSCULAR | Status: AC
Start: 2017-12-10 — End: 2017-12-10
  Administered 2017-12-10: 20000 [IU] via SUBCUTANEOUS
  Filled 2017-12-10: qty 1

## 2017-12-13 DIAGNOSIS — I129 Hypertensive chronic kidney disease with stage 1 through stage 4 chronic kidney disease, or unspecified chronic kidney disease: Secondary | ICD-10-CM | POA: Diagnosis not present

## 2017-12-13 DIAGNOSIS — N183 Chronic kidney disease, stage 3 (moderate): Secondary | ICD-10-CM | POA: Diagnosis not present

## 2017-12-24 ENCOUNTER — Ambulatory Visit (HOSPITAL_COMMUNITY)
Admission: RE | Admit: 2017-12-24 | Discharge: 2017-12-24 | Disposition: A | Discharge status: Home / Self Care | Payer: Medicare Other | Source: Ambulatory Visit | Attending: Nephrology | Admitting: Nephrology

## 2017-12-24 VITALS — BP 109/69 | HR 72 | Temp 98.6°F | Resp 20

## 2017-12-24 DIAGNOSIS — D631 Anemia in chronic kidney disease: Secondary | ICD-10-CM | POA: Insufficient documentation

## 2017-12-24 DIAGNOSIS — Z5181 Encounter for therapeutic drug level monitoring: Secondary | ICD-10-CM | POA: Diagnosis not present

## 2017-12-24 DIAGNOSIS — N183 Chronic kidney disease, stage 3 (moderate): Secondary | ICD-10-CM | POA: Diagnosis not present

## 2017-12-24 DIAGNOSIS — Z79899 Other long term (current) drug therapy: Secondary | ICD-10-CM | POA: Diagnosis not present

## 2017-12-24 DIAGNOSIS — N289 Disorder of kidney and ureter, unspecified: Secondary | ICD-10-CM

## 2017-12-24 LAB — POCT HEMOGLOBIN-HEMACUE: HEMOGLOBIN: 9.2 g/dL — AB (ref 13.0–17.0)

## 2017-12-24 MED ORDER — EPOETIN ALFA 20000 UNIT/ML IJ SOLN
20000.0000 [IU] | INTRAMUSCULAR | Status: DC
  Administered 2017-12-24: 11:00:00 20000 [IU] via SUBCUTANEOUS

## 2017-12-24 MED ORDER — EPOETIN ALFA 20000 UNIT/ML IJ SOLN
INTRAMUSCULAR | Status: AC
  Filled 2017-12-24: qty 1

## 2018-01-06 ENCOUNTER — Other Ambulatory Visit (HOSPITAL_COMMUNITY): Payer: Self-pay

## 2018-01-07 ENCOUNTER — Ambulatory Visit (HOSPITAL_COMMUNITY)
Admission: RE | Admit: 2018-01-07 | Discharge: 2018-01-07 | Disposition: A | Discharge status: Home / Self Care | Payer: Medicare Other | Source: Ambulatory Visit | Attending: Nephrology | Admitting: Nephrology

## 2018-01-07 VITALS — BP 101/68 | HR 76 | Temp 98.3°F | Resp 20

## 2018-01-07 DIAGNOSIS — D631 Anemia in chronic kidney disease: Secondary | ICD-10-CM | POA: Insufficient documentation

## 2018-01-07 DIAGNOSIS — N183 Chronic kidney disease, stage 3 (moderate): Secondary | ICD-10-CM | POA: Diagnosis not present

## 2018-01-07 DIAGNOSIS — N289 Disorder of kidney and ureter, unspecified: Secondary | ICD-10-CM

## 2018-01-07 LAB — IRON AND TIBC
IRON: 52 ug/dL (ref 45–182)
Saturation Ratios: 16 % — ABNORMAL LOW (ref 17.9–39.5)
TIBC: 330 ug/dL (ref 250–450)
UIBC: 278 ug/dL

## 2018-01-07 LAB — POCT HEMOGLOBIN-HEMACUE: HEMOGLOBIN: 9 g/dL — AB (ref 13.0–17.0)

## 2018-01-07 LAB — FERRITIN: FERRITIN: 994 ng/mL — AB (ref 24–336)

## 2018-01-07 MED ORDER — EPOETIN ALFA 20000 UNIT/ML IJ SOLN
INTRAMUSCULAR | Status: AC
  Administered 2018-01-07: 20000 [IU]
  Filled 2018-01-07: qty 1

## 2018-01-07 MED ORDER — EPOETIN ALFA 20000 UNIT/ML IJ SOLN: 20000.0000 [IU] | INTRAMUSCULAR | Status: DC

## 2018-01-20 ENCOUNTER — Other Ambulatory Visit (HOSPITAL_COMMUNITY): Payer: Self-pay | Admitting: *Deleted

## 2018-01-21 ENCOUNTER — Ambulatory Visit (HOSPITAL_COMMUNITY)
Admission: RE | Admit: 2018-01-21 | Discharge: 2018-01-21 | Disposition: A | Discharge status: Home / Self Care | Payer: Medicare Other | Source: Ambulatory Visit | Attending: Nephrology | Admitting: Nephrology

## 2018-01-21 VITALS — BP 116/73 | HR 78 | Temp 98.6°F | Resp 20 | Ht 72.0 in | Wt 201.0 lb

## 2018-01-21 DIAGNOSIS — N183 Chronic kidney disease, stage 3 (moderate): Secondary | ICD-10-CM | POA: Insufficient documentation

## 2018-01-21 DIAGNOSIS — N289 Disorder of kidney and ureter, unspecified: Secondary | ICD-10-CM

## 2018-01-21 DIAGNOSIS — D631 Anemia in chronic kidney disease: Secondary | ICD-10-CM | POA: Diagnosis not present

## 2018-01-21 LAB — POCT HEMOGLOBIN-HEMACUE: Hemoglobin: 9.3 g/dL — ABNORMAL LOW (ref 13.0–17.0)

## 2018-01-21 MED ORDER — SODIUM CHLORIDE 0.9 % IV SOLN
510.0000 mg | INTRAVENOUS | Status: DC
  Administered 2018-01-21: 510 mg via INTRAVENOUS
  Filled 2018-01-21: qty 17

## 2018-01-21 MED ORDER — EPOETIN ALFA 20000 UNIT/ML IJ SOLN
INTRAMUSCULAR | Status: AC
  Filled 2018-01-21: qty 1

## 2018-01-21 MED ORDER — EPOETIN ALFA 20000 UNIT/ML IJ SOLN
20000.0000 [IU] | INTRAMUSCULAR | Status: DC
  Administered 2018-01-21: 20000 [IU] via SUBCUTANEOUS

## 2018-01-27 ENCOUNTER — Other Ambulatory Visit (HOSPITAL_COMMUNITY): Payer: Self-pay | Admitting: *Deleted

## 2018-01-28 ENCOUNTER — Ambulatory Visit (HOSPITAL_COMMUNITY)
Admission: RE | Admit: 2018-01-28 | Discharge: 2018-01-28 | Disposition: A | Discharge status: Home / Self Care | Payer: Medicare Other | Source: Ambulatory Visit | Attending: Nephrology | Admitting: Nephrology

## 2018-01-28 DIAGNOSIS — N183 Chronic kidney disease, stage 3 (moderate): Secondary | ICD-10-CM | POA: Diagnosis not present

## 2018-01-28 DIAGNOSIS — D631 Anemia in chronic kidney disease: Secondary | ICD-10-CM | POA: Diagnosis not present

## 2018-01-28 MED ORDER — SODIUM CHLORIDE 0.9 % IV SOLN
510.0000 mg | Freq: Once | INTRAVENOUS | Status: AC
  Administered 2018-01-28: 510 mg via INTRAVENOUS
  Filled 2018-01-28: qty 17

## 2018-02-04 ENCOUNTER — Ambulatory Visit (HOSPITAL_COMMUNITY)
Admission: RE | Admit: 2018-02-04 | Discharge: 2018-02-04 | Disposition: A | Discharge status: Home / Self Care | Payer: Medicare Other | Source: Ambulatory Visit | Attending: Nephrology | Admitting: Nephrology

## 2018-02-04 VITALS — BP 119/77 | HR 89 | Temp 99.0°F | Resp 20

## 2018-02-04 DIAGNOSIS — N189 Chronic kidney disease, unspecified: Secondary | ICD-10-CM | POA: Insufficient documentation

## 2018-02-04 DIAGNOSIS — N289 Disorder of kidney and ureter, unspecified: Secondary | ICD-10-CM

## 2018-02-04 DIAGNOSIS — D631 Anemia in chronic kidney disease: Secondary | ICD-10-CM | POA: Diagnosis not present

## 2018-02-04 LAB — IRON AND TIBC
Iron: 59 ug/dL (ref 45–182)
SATURATION RATIOS: 20 % (ref 17.9–39.5)
TIBC: 300 ug/dL (ref 250–450)
UIBC: 241 ug/dL

## 2018-02-04 LAB — POCT HEMOGLOBIN-HEMACUE: Hemoglobin: 8.4 g/dL — ABNORMAL LOW (ref 13.0–17.0)

## 2018-02-04 LAB — FERRITIN: Ferritin: 1501 ng/mL — ABNORMAL HIGH (ref 24–336)

## 2018-02-04 MED ORDER — EPOETIN ALFA 20000 UNIT/ML IJ SOLN: 20000.0000 [IU] | INTRAMUSCULAR | Status: DC

## 2018-02-04 MED ORDER — EPOETIN ALFA 20000 UNIT/ML IJ SOLN
INTRAMUSCULAR | Status: AC
  Administered 2018-02-04: 20000 [IU]
  Filled 2018-02-04: qty 1

## 2018-02-11 ENCOUNTER — Encounter (HOSPITAL_COMMUNITY): Payer: Medicare Other

## 2018-02-15 DIAGNOSIS — Z1389 Encounter for screening for other disorder: Secondary | ICD-10-CM | POA: Diagnosis not present

## 2018-02-15 DIAGNOSIS — N08 Glomerular disorders in diseases classified elsewhere: Secondary | ICD-10-CM | POA: Diagnosis not present

## 2018-02-15 DIAGNOSIS — E1122 Type 2 diabetes mellitus with diabetic chronic kidney disease: Secondary | ICD-10-CM | POA: Diagnosis not present

## 2018-02-15 DIAGNOSIS — N183 Chronic kidney disease, stage 3 (moderate): Secondary | ICD-10-CM | POA: Diagnosis not present

## 2018-02-15 DIAGNOSIS — I129 Hypertensive chronic kidney disease with stage 1 through stage 4 chronic kidney disease, or unspecified chronic kidney disease: Secondary | ICD-10-CM | POA: Diagnosis not present

## 2018-02-15 DIAGNOSIS — R0602 Shortness of breath: Secondary | ICD-10-CM | POA: Diagnosis not present

## 2018-02-18 ENCOUNTER — Ambulatory Visit (HOSPITAL_COMMUNITY)
Admission: RE | Admit: 2018-02-18 | Discharge: 2018-02-18 | Disposition: A | Discharge status: Home / Self Care | Payer: Medicare Other | Source: Ambulatory Visit | Attending: Nephrology | Admitting: Nephrology

## 2018-02-18 VITALS — BP 118/61 | HR 76 | Temp 98.1°F | Resp 16

## 2018-02-18 DIAGNOSIS — N189 Chronic kidney disease, unspecified: Secondary | ICD-10-CM | POA: Insufficient documentation

## 2018-02-18 DIAGNOSIS — N289 Disorder of kidney and ureter, unspecified: Secondary | ICD-10-CM

## 2018-02-18 DIAGNOSIS — D631 Anemia in chronic kidney disease: Secondary | ICD-10-CM | POA: Insufficient documentation

## 2018-02-18 LAB — POCT HEMOGLOBIN-HEMACUE: Hemoglobin: 8.7 g/dL — ABNORMAL LOW (ref 13.0–17.0)

## 2018-02-18 MED ORDER — EPOETIN ALFA 20000 UNIT/ML IJ SOLN
INTRAMUSCULAR | Status: AC
  Administered 2018-02-18: 20000 [IU] via SUBCUTANEOUS
  Filled 2018-02-18: qty 1

## 2018-02-18 MED ORDER — EPOETIN ALFA 40000 UNIT/ML IJ SOLN: 30000.0000 [IU] | INTRAMUSCULAR | Status: DC

## 2018-02-18 MED ORDER — EPOETIN ALFA 10000 UNIT/ML IJ SOLN
INTRAMUSCULAR | Status: AC
  Administered 2018-02-18: 10000 [IU] via SUBCUTANEOUS
  Filled 2018-02-18: qty 1

## 2018-02-18 MED ORDER — SODIUM CHLORIDE 0.9 % IV SOLN
510.0000 mg | Freq: Once | INTRAVENOUS | Status: AC
  Administered 2018-02-18: 510 mg via INTRAVENOUS
  Filled 2018-02-18: qty 17

## 2018-03-04 ENCOUNTER — Ambulatory Visit (HOSPITAL_COMMUNITY)
Admission: RE | Admit: 2018-03-04 | Discharge: 2018-03-04 | Disposition: A | Discharge status: Home / Self Care | Payer: Medicare Other | Source: Ambulatory Visit | Attending: Nephrology | Admitting: Nephrology

## 2018-03-04 VITALS — BP 128/62 | HR 41 | Resp 16

## 2018-03-04 DIAGNOSIS — N289 Disorder of kidney and ureter, unspecified: Secondary | ICD-10-CM | POA: Diagnosis not present

## 2018-03-04 LAB — IRON AND TIBC
IRON: 58 ug/dL (ref 45–182)
SATURATION RATIOS: 18 % (ref 17.9–39.5)
TIBC: 326 ug/dL (ref 250–450)
UIBC: 268 ug/dL

## 2018-03-04 LAB — POCT HEMOGLOBIN-HEMACUE: Hemoglobin: 9.3 g/dL — ABNORMAL LOW (ref 13.0–17.0)

## 2018-03-04 LAB — FERRITIN: Ferritin: 1276 ng/mL — ABNORMAL HIGH (ref 24–336)

## 2018-03-04 MED ORDER — EPOETIN ALFA 10000 UNIT/ML IJ SOLN
INTRAMUSCULAR | Status: AC
  Administered 2018-03-04: 10000 [IU] via SUBCUTANEOUS
  Filled 2018-03-04: qty 1

## 2018-03-04 MED ORDER — EPOETIN ALFA 40000 UNIT/ML IJ SOLN: 30000.0000 [IU] | INTRAMUSCULAR | Status: DC

## 2018-03-04 MED ORDER — EPOETIN ALFA 20000 UNIT/ML IJ SOLN
INTRAMUSCULAR | Status: AC
  Administered 2018-03-04: 20000 [IU] via SUBCUTANEOUS
  Filled 2018-03-04: qty 1

## 2018-03-14 DIAGNOSIS — I129 Hypertensive chronic kidney disease with stage 1 through stage 4 chronic kidney disease, or unspecified chronic kidney disease: Secondary | ICD-10-CM | POA: Diagnosis not present

## 2018-03-14 DIAGNOSIS — E785 Hyperlipidemia, unspecified: Secondary | ICD-10-CM | POA: Diagnosis not present

## 2018-03-14 DIAGNOSIS — M889 Osteitis deformans of unspecified bone: Secondary | ICD-10-CM | POA: Diagnosis not present

## 2018-03-14 DIAGNOSIS — N2589 Other disorders resulting from impaired renal tubular function: Secondary | ICD-10-CM | POA: Diagnosis not present

## 2018-03-14 DIAGNOSIS — N400 Enlarged prostate without lower urinary tract symptoms: Secondary | ICD-10-CM | POA: Diagnosis not present

## 2018-03-14 DIAGNOSIS — N2581 Secondary hyperparathyroidism of renal origin: Secondary | ICD-10-CM | POA: Diagnosis not present

## 2018-03-14 DIAGNOSIS — E1122 Type 2 diabetes mellitus with diabetic chronic kidney disease: Secondary | ICD-10-CM | POA: Diagnosis not present

## 2018-03-14 DIAGNOSIS — D631 Anemia in chronic kidney disease: Secondary | ICD-10-CM | POA: Diagnosis not present

## 2018-03-14 DIAGNOSIS — K279 Peptic ulcer, site unspecified, unspecified as acute or chronic, without hemorrhage or perforation: Secondary | ICD-10-CM | POA: Diagnosis not present

## 2018-03-14 DIAGNOSIS — K219 Gastro-esophageal reflux disease without esophagitis: Secondary | ICD-10-CM | POA: Diagnosis not present

## 2018-03-14 DIAGNOSIS — M109 Gout, unspecified: Secondary | ICD-10-CM | POA: Diagnosis not present

## 2018-03-14 DIAGNOSIS — N183 Chronic kidney disease, stage 3 (moderate): Secondary | ICD-10-CM | POA: Diagnosis not present

## 2018-03-17 ENCOUNTER — Other Ambulatory Visit (HOSPITAL_COMMUNITY): Payer: Self-pay

## 2018-03-18 ENCOUNTER — Ambulatory Visit (HOSPITAL_COMMUNITY)
Admission: RE | Admit: 2018-03-18 | Discharge: 2018-03-18 | Disposition: A | Discharge status: Home / Self Care | Payer: Medicare Other | Source: Ambulatory Visit | Attending: Nephrology | Admitting: Nephrology

## 2018-03-18 VITALS — BP 108/54 | HR 60 | Temp 97.9°F | Resp 12 | Ht 72.0 in | Wt 208.0 lb

## 2018-03-18 DIAGNOSIS — D631 Anemia in chronic kidney disease: Secondary | ICD-10-CM | POA: Insufficient documentation

## 2018-03-18 DIAGNOSIS — N189 Chronic kidney disease, unspecified: Secondary | ICD-10-CM | POA: Diagnosis not present

## 2018-03-18 DIAGNOSIS — N289 Disorder of kidney and ureter, unspecified: Secondary | ICD-10-CM

## 2018-03-18 LAB — POCT HEMOGLOBIN-HEMACUE: Hemoglobin: 9.7 g/dL — ABNORMAL LOW (ref 13.0–17.0)

## 2018-03-18 MED ORDER — SODIUM CHLORIDE 0.9 % IV SOLN
510.0000 mg | Freq: Once | INTRAVENOUS | Status: AC
  Administered 2018-03-18: 510 mg via INTRAVENOUS
  Filled 2018-03-18: qty 17

## 2018-03-18 MED ORDER — EPOETIN ALFA 20000 UNIT/ML IJ SOLN
INTRAMUSCULAR | Status: AC
  Administered 2018-03-18: 20000 [IU]
  Filled 2018-03-18: qty 1

## 2018-03-18 MED ORDER — EPOETIN ALFA 10000 UNIT/ML IJ SOLN
INTRAMUSCULAR | Status: AC
  Administered 2018-03-18: 10000 [IU]
  Filled 2018-03-18: qty 1

## 2018-03-18 MED ORDER — EPOETIN ALFA 40000 UNIT/ML IJ SOLN: 30000.0000 [IU] | INTRAMUSCULAR | Status: DC

## 2018-04-01 ENCOUNTER — Ambulatory Visit (HOSPITAL_COMMUNITY)
Admission: RE | Admit: 2018-04-01 | Discharge: 2018-04-01 | Disposition: A | Discharge status: Home / Self Care | Payer: Medicare Other | Source: Ambulatory Visit | Attending: Nephrology | Admitting: Nephrology

## 2018-04-01 VITALS — BP 139/91 | HR 80 | Temp 98.4°F | Ht 72.0 in | Wt 208.0 lb

## 2018-04-01 DIAGNOSIS — D631 Anemia in chronic kidney disease: Secondary | ICD-10-CM | POA: Diagnosis not present

## 2018-04-01 DIAGNOSIS — N189 Chronic kidney disease, unspecified: Secondary | ICD-10-CM | POA: Insufficient documentation

## 2018-04-01 DIAGNOSIS — N289 Disorder of kidney and ureter, unspecified: Secondary | ICD-10-CM

## 2018-04-01 LAB — IRON AND TIBC
Iron: 57 ug/dL (ref 45–182)
Saturation Ratios: 17 % — ABNORMAL LOW (ref 17.9–39.5)
TIBC: 343 ug/dL (ref 250–450)
UIBC: 286 ug/dL

## 2018-04-01 LAB — FERRITIN: FERRITIN: 1420 ng/mL — AB (ref 24–336)

## 2018-04-01 LAB — POCT HEMOGLOBIN-HEMACUE: Hemoglobin: 10.7 g/dL — ABNORMAL LOW (ref 13.0–17.0)

## 2018-04-01 MED ORDER — EPOETIN ALFA 40000 UNIT/ML IJ SOLN: 30000.0000 [IU] | INTRAMUSCULAR | Status: DC

## 2018-04-01 MED ORDER — EPOETIN ALFA 10000 UNIT/ML IJ SOLN
INTRAMUSCULAR | Status: AC
  Administered 2018-04-01: 10000 [IU] via SUBCUTANEOUS
  Filled 2018-04-01: qty 1

## 2018-04-01 MED ORDER — EPOETIN ALFA 20000 UNIT/ML IJ SOLN
INTRAMUSCULAR | Status: AC
  Administered 2018-04-01: 20000 [IU] via SUBCUTANEOUS
  Filled 2018-04-01: qty 1

## 2018-04-13 DIAGNOSIS — R195 Other fecal abnormalities: Secondary | ICD-10-CM | POA: Diagnosis not present

## 2018-04-13 DIAGNOSIS — D509 Iron deficiency anemia, unspecified: Secondary | ICD-10-CM | POA: Diagnosis not present

## 2018-04-13 DIAGNOSIS — N19 Unspecified kidney failure: Secondary | ICD-10-CM | POA: Diagnosis not present

## 2018-04-15 ENCOUNTER — Encounter (HOSPITAL_COMMUNITY): Payer: Medicare Other

## 2018-04-19 ENCOUNTER — Other Ambulatory Visit (HOSPITAL_COMMUNITY): Payer: Self-pay | Admitting: *Deleted

## 2018-04-20 ENCOUNTER — Ambulatory Visit (HOSPITAL_COMMUNITY)
Admission: RE | Admit: 2018-04-20 | Discharge: 2018-04-20 | Disposition: A | Discharge status: Home / Self Care | Payer: Medicare Other | Source: Ambulatory Visit | Attending: Nephrology | Admitting: Nephrology

## 2018-04-20 VITALS — BP 114/61 | HR 60 | Temp 98.2°F | Resp 18 | Ht 72.0 in | Wt 206.0 lb

## 2018-04-20 DIAGNOSIS — D631 Anemia in chronic kidney disease: Secondary | ICD-10-CM | POA: Diagnosis not present

## 2018-04-20 DIAGNOSIS — N189 Chronic kidney disease, unspecified: Secondary | ICD-10-CM | POA: Diagnosis not present

## 2018-04-20 DIAGNOSIS — E119 Type 2 diabetes mellitus without complications: Secondary | ICD-10-CM | POA: Diagnosis not present

## 2018-04-20 DIAGNOSIS — N289 Disorder of kidney and ureter, unspecified: Secondary | ICD-10-CM

## 2018-04-20 LAB — POCT HEMOGLOBIN-HEMACUE: HEMOGLOBIN: 10.1 g/dL — AB (ref 13.0–17.0)

## 2018-04-20 MED ORDER — SODIUM CHLORIDE 0.9 % IV SOLN
510.0000 mg | Freq: Once | INTRAVENOUS | Status: AC
  Administered 2018-04-20: 510 mg via INTRAVENOUS
  Filled 2018-04-20: qty 17

## 2018-04-20 MED ORDER — EPOETIN ALFA 10000 UNIT/ML IJ SOLN
INTRAMUSCULAR | Status: AC
  Administered 2018-04-20: 10000 [IU]
  Filled 2018-04-20: qty 1

## 2018-04-20 MED ORDER — EPOETIN ALFA 40000 UNIT/ML IJ SOLN: 30000.0000 [IU] | INTRAMUSCULAR | Status: DC

## 2018-04-20 MED ORDER — EPOETIN ALFA 20000 UNIT/ML IJ SOLN
INTRAMUSCULAR | Status: AC
  Administered 2018-04-20: 20000 [IU]
  Filled 2018-04-20: qty 1

## 2018-05-04 ENCOUNTER — Ambulatory Visit (HOSPITAL_COMMUNITY)
Admission: RE | Admit: 2018-05-04 | Discharge: 2018-05-04 | Disposition: A | Discharge status: Home / Self Care | Payer: Medicare Other | Source: Ambulatory Visit | Attending: Nephrology | Admitting: Nephrology

## 2018-05-04 VITALS — BP 119/78 | HR 84 | Temp 98.5°F | Resp 16

## 2018-05-04 DIAGNOSIS — N289 Disorder of kidney and ureter, unspecified: Secondary | ICD-10-CM | POA: Diagnosis not present

## 2018-05-04 LAB — FERRITIN: Ferritin: 1268 ng/mL — ABNORMAL HIGH (ref 24–336)

## 2018-05-04 LAB — IRON AND TIBC
IRON: 65 ug/dL (ref 45–182)
Saturation Ratios: 20 % (ref 17.9–39.5)
TIBC: 319 ug/dL (ref 250–450)
UIBC: 254 ug/dL

## 2018-05-04 MED ORDER — EPOETIN ALFA 40000 UNIT/ML IJ SOLN: 30000.0000 [IU] | INTRAMUSCULAR | Status: DC

## 2018-05-04 MED ORDER — EPOETIN ALFA 20000 UNIT/ML IJ SOLN
INTRAMUSCULAR | Status: AC
  Filled 2018-05-04: qty 1

## 2018-05-04 MED ORDER — EPOETIN ALFA 10000 UNIT/ML IJ SOLN
INTRAMUSCULAR | Status: AC
  Filled 2018-05-04: qty 1

## 2018-05-05 LAB — POCT HEMOGLOBIN-HEMACUE: Hemoglobin: 12.2 g/dL — ABNORMAL LOW (ref 13.0–17.0)

## 2018-05-18 ENCOUNTER — Encounter (HOSPITAL_COMMUNITY)
Admission: RE | Admit: 2018-05-18 | Discharge: 2018-05-18 | Disposition: A | Discharge status: Home / Self Care | Payer: Medicare Other | Source: Ambulatory Visit | Attending: Nephrology | Admitting: Nephrology

## 2018-05-18 VITALS — BP 131/77 | HR 39 | Temp 98.3°F | Resp 20

## 2018-05-18 DIAGNOSIS — N183 Chronic kidney disease, stage 3 (moderate): Secondary | ICD-10-CM | POA: Insufficient documentation

## 2018-05-18 DIAGNOSIS — D631 Anemia in chronic kidney disease: Secondary | ICD-10-CM | POA: Diagnosis not present

## 2018-05-18 DIAGNOSIS — Z79899 Other long term (current) drug therapy: Secondary | ICD-10-CM | POA: Insufficient documentation

## 2018-05-18 DIAGNOSIS — Z5181 Encounter for therapeutic drug level monitoring: Secondary | ICD-10-CM | POA: Insufficient documentation

## 2018-05-18 DIAGNOSIS — N289 Disorder of kidney and ureter, unspecified: Secondary | ICD-10-CM

## 2018-05-18 LAB — POCT HEMOGLOBIN-HEMACUE: HEMOGLOBIN: 11.6 g/dL — AB (ref 13.0–17.0)

## 2018-05-18 MED ORDER — EPOETIN ALFA 40000 UNIT/ML IJ SOLN: 30000.0000 [IU] | INTRAMUSCULAR | Status: DC

## 2018-05-18 MED ORDER — EPOETIN ALFA 20000 UNIT/ML IJ SOLN
INTRAMUSCULAR | Status: AC
  Administered 2018-05-18: 20000 [IU] via SUBCUTANEOUS
  Filled 2018-05-18: qty 1

## 2018-05-18 MED ORDER — EPOETIN ALFA 10000 UNIT/ML IJ SOLN
INTRAMUSCULAR | Status: AC
  Administered 2018-05-18: 10000 [IU] via SUBCUTANEOUS
  Filled 2018-05-18: qty 1

## 2018-05-25 DIAGNOSIS — Z23 Encounter for immunization: Secondary | ICD-10-CM | POA: Diagnosis not present

## 2018-06-01 ENCOUNTER — Encounter (HOSPITAL_COMMUNITY)
Admission: RE | Admit: 2018-06-01 | Discharge: 2018-06-01 | Disposition: A | Discharge status: Home / Self Care | Payer: Medicare Other | Source: Ambulatory Visit | Attending: Nephrology | Admitting: Nephrology

## 2018-06-01 VITALS — BP 129/101 | HR 84 | Temp 98.3°F | Resp 20

## 2018-06-01 DIAGNOSIS — Z79899 Other long term (current) drug therapy: Secondary | ICD-10-CM | POA: Diagnosis not present

## 2018-06-01 DIAGNOSIS — D631 Anemia in chronic kidney disease: Secondary | ICD-10-CM | POA: Insufficient documentation

## 2018-06-01 DIAGNOSIS — Z5181 Encounter for therapeutic drug level monitoring: Secondary | ICD-10-CM | POA: Insufficient documentation

## 2018-06-01 DIAGNOSIS — N183 Chronic kidney disease, stage 3 (moderate): Secondary | ICD-10-CM | POA: Diagnosis not present

## 2018-06-01 DIAGNOSIS — N289 Disorder of kidney and ureter, unspecified: Secondary | ICD-10-CM

## 2018-06-01 LAB — POCT HEMOGLOBIN-HEMACUE: HEMOGLOBIN: 11.6 g/dL — AB (ref 13.0–17.0)

## 2018-06-01 LAB — IRON AND TIBC
Iron: 59 ug/dL (ref 45–182)
SATURATION RATIOS: 20 % (ref 17.9–39.5)
TIBC: 300 ug/dL (ref 250–450)
UIBC: 241 ug/dL

## 2018-06-01 LAB — FERRITIN: FERRITIN: 1247 ng/mL — AB (ref 24–336)

## 2018-06-01 MED ORDER — EPOETIN ALFA 40000 UNIT/ML IJ SOLN: 30000.0000 [IU] | INTRAMUSCULAR | Status: DC

## 2018-06-01 MED ORDER — EPOETIN ALFA 20000 UNIT/ML IJ SOLN
INTRAMUSCULAR | Status: AC
  Administered 2018-06-01: 20000 [IU]
  Filled 2018-06-01: qty 1

## 2018-06-01 MED ORDER — EPOETIN ALFA 10000 UNIT/ML IJ SOLN
INTRAMUSCULAR | Status: AC
  Administered 2018-06-01: 10000 [IU]
  Filled 2018-06-01: qty 1

## 2018-06-14 ENCOUNTER — Ambulatory Visit (INDEPENDENT_AMBULATORY_CARE_PROVIDER_SITE_OTHER): Payer: Medicare Other | Admitting: Internal Medicine

## 2018-06-14 ENCOUNTER — Other Ambulatory Visit (HOSPITAL_COMMUNITY): Payer: Self-pay

## 2018-06-14 ENCOUNTER — Encounter: Payer: Self-pay | Admitting: Internal Medicine

## 2018-06-14 VITALS — BP 138/80 | HR 39 | Temp 98.2°F | Ht 72.0 in | Wt 210.0 lb

## 2018-06-14 DIAGNOSIS — E1165 Type 2 diabetes mellitus with hyperglycemia: Secondary | ICD-10-CM | POA: Diagnosis not present

## 2018-06-14 DIAGNOSIS — I10 Essential (primary) hypertension: Secondary | ICD-10-CM | POA: Diagnosis not present

## 2018-06-14 NOTE — Progress Notes (Signed)
Subjective:     Patient ID: Alec Solis , male    DOB: [birth year] , 84 y.o.   MRN: [id number] HPI  Pt is here for Dm FU. Average low 100's. Has had occasional hypoglycemic episodes when glucose in the 80's.  Denies trouble with slow healing wounds or numbness of feet.  Past Medical History:  Diagnosis Date  . Anemia   . Anxiety   . Arthritis   . Cataract   . Chronic kidney disease   . Depression   . Diabetes mellitus without complication ([place])    type II   . GERD (gastroesophageal reflux disease)   . Gout   . Heart murmur   . Hypertension   . Myocardial infarction ([place])    minor Mi- years ago -   . Varicose veins of left lower extremity       Current Outpatient Medications:  .  acetaminophen (TYLENOL) 500 MG tablet, Take 2 tablets (1,000 mg total) by mouth every 8 (eight) hours as needed. (Patient taking differently: Take 500 mg by mouth every 8 (eight) hours as needed for moderate pain. ), Disp: 30 tablet, Rfl: 0 .  allopurinol (ZYLOPRIM) 100 MG tablet, Take 100 mg by mouth 2 (two) times daily. , Disp: , Rfl:  .  amLODipine (NORVASC) 5 MG tablet, Take 5 mg by mouth daily., Disp: , Rfl:  .  aspirin 81 MG tablet, Take 81 mg by mouth daily., Disp: , Rfl:  .  calcitRIOL (ROCALTROL) 0.25 MCG capsule, Take 0.25 mcg by mouth See admin instructions. Monday Thursday Sunday,  Pt takes every 3rd day, Disp: , Rfl:  .  carboxymethylcellulose (REFRESH PLUS) 0.5 % SOLN, 1 drop 3 (three) times daily as needed. As needed, Disp: , Rfl:  .  carvedilol (COREG) 25 MG tablet, Take 12.5 mg by mouth 2 (two) times daily with a meal. , Disp: , Rfl:  .  clopidogrel (PLAVIX) 75 MG tablet, Take 75 mg by mouth daily., Disp: , Rfl:  .  feeding supplement, GLUCERNA SHAKE, (GLUCERNA SHAKE) LIQD, Take 237 mLs by mouth 2 (two) times daily between meals., Disp: 60 Can, Rfl: 0 .  Ferrous Gluconate (IRON 27 PO), Take by mouth. Patient takes as needed, Disp: , Rfl:  .  furosemide (LASIX) 40 MG tablet, Take 40 mg by  mouth daily., Disp: , Rfl:  .  Ginger, Zingiber officinalis, (GINGER ROOT) 550 MG CAPS, Take by mouth. Patient takes prn, Disp: , Rfl:  .  insulin aspart (NOVOLOG FLEXPEN) 100 UNIT/ML FlexPen, Inject 6 Units into the skin 3 (three) times daily with meals. (Patient taking differently: Inject 2 Units into the skin 3 (three) times daily with meals. ), Disp: 15 mL, Rfl: 0 .  Insulin Degludec (TRESIBA [place]), Inject 20 Units into the skin daily., Disp: , Rfl:  .  Insulin Pen Needle 32G X 8 MM MISC, Use as directed, Disp: 100 each, Rfl: 0 .  omeprazole (PRILOSEC) 40 MG capsule, Take 40 mg by mouth daily., Disp: , Rfl: 3 .  sodium bicarbonate 650 MG tablet, Take 1,300 mg by mouth 2 (two) times daily., Disp: , Rfl:  .  Tamsulosin HCl (FLOMAX) 0.4 MG CAPS, Take 0.4 mg by mouth 2 (two) times daily., Disp: , Rfl:    No Known Allergies   Review of Systems  Constitutional: Positive for diaphoresis. Negative for appetite change, chills and fever.       Sometimes gets sweats at night and his glucose are not high.   Respiratory:  Negative for chest tightness and shortness of breath.        Sometimes gets a dull short duration chest pain. Has plans to have repeat echo, per cardiologist recommendsation to do it when he has time to do it.  Occasionally gets SOB at rest or activity and seems more due to allergies.   Cardiovascular: Positive for chest pain. Negative for palpitations and leg swelling.       Sometimes gets a dull short duration chest pain. Has plans to have repeat echo, per cardiologist recommendsation to do it when he has time to do it.  Occasionally gets SOB at rest or activity and seems more due to allergies.   Gastrointestinal: Positive for constipation and diarrhea.       Gets intermittent constipation and diarrhea for years.   Skin: Negative for rash and wound.       Has dry skin  Neurological: Negative for numbness.     Today's Vitals   06/14/18 1014  BP: 138/80  Pulse: (!) 39  Temp: 98.2  F (36.8 C)  TempSrc: Oral  SpO2: 98%  Weight: 210 lb (95.3 kg)  Height: 6' (1.829 m)   Body mass index is 28.48 kg/m.   Objective:  Physical Exam  Constitutional: He is oriented to person, place, and time. He appears well-developed and well-nourished. No distress.  HENT:  Head: Normocephalic.  Left Ear: External ear normal.  Nose: Nose normal.  Eyes: Conjunctivae are normal. Right eye exhibits no discharge. Left eye exhibits no discharge. No scleral icterus.  Neck: Neck supple. No thyromegaly present.  Cardiovascular: Normal rate and regular rhythm. Exam reveals no gallop and no friction rub.  Murmur heard. 1/6 murmur  Pulmonary/Chest: Effort normal and breath sounds normal. No respiratory distress. He has no wheezes.  Musculoskeletal: He exhibits no edema.  Lymphadenopathy:    He has no cervical adenopathy.  Neurological: He is alert and oriented to person, place, and time. No sensory deficit.  Skin: Skin is warm and dry. Capillary refill takes less than 2 seconds.  Psychiatric: He has a normal mood and affect. His behavior is normal. Judgment and thought content normal.  Nursing note and vitals reviewed.       Assessment And Plan:     1. Uncontrolled type 2 diabetes mellitus with hyperglycemia (HCC)     Chronic- HGBA1C ordered    FU with PCP in 4 months 2. Hypertension, essential- chronic, may continue same meds. CBC and CMP ordered   FU with PCP in 4 months   He wanted me to order a PSA but Medicare would not cover this when I ordered it as screen for prostate CA and he did not want to get a bill. He will go see his urologist.   Shelby Mattocks, PA-C

## 2018-06-15 ENCOUNTER — Ambulatory Visit (HOSPITAL_COMMUNITY)
Admission: RE | Admit: 2018-06-15 | Discharge: 2018-06-15 | Disposition: A | Discharge status: Home / Self Care | Payer: Medicare Other | Source: Ambulatory Visit | Attending: Nephrology | Admitting: Nephrology

## 2018-06-15 VITALS — BP 135/70 | HR 37 | Temp 98.6°F | Resp 20

## 2018-06-15 DIAGNOSIS — N289 Disorder of kidney and ureter, unspecified: Secondary | ICD-10-CM

## 2018-06-15 DIAGNOSIS — N189 Chronic kidney disease, unspecified: Secondary | ICD-10-CM | POA: Insufficient documentation

## 2018-06-15 DIAGNOSIS — D631 Anemia in chronic kidney disease: Secondary | ICD-10-CM | POA: Diagnosis not present

## 2018-06-15 LAB — CMP14 + ANION GAP
ALBUMIN: 4.4 g/dL (ref 3.5–4.7)
ALT: 15 IU/L (ref 0–44)
AST: 17 IU/L (ref 0–40)
Albumin/Globulin Ratio: 1.3 (ref 1.2–2.2)
Alkaline Phosphatase: 146 IU/L — ABNORMAL HIGH (ref 39–117)
Anion Gap: 19 mmol/L — ABNORMAL HIGH (ref 10.0–18.0)
BUN / CREAT RATIO: 14 (ref 10–24)
BUN: 25 mg/dL (ref 8–27)
Bilirubin Total: 0.2 mg/dL (ref 0.0–1.2)
CO2: 16 mmol/L — AB (ref 20–29)
CREATININE: 1.8 mg/dL — AB (ref 0.76–1.27)
Calcium: 9.9 mg/dL (ref 8.6–10.2)
Chloride: 113 mmol/L — ABNORMAL HIGH (ref 96–106)
GFR calc Af Amer: 39 mL/min/{1.73_m2} — ABNORMAL LOW (ref >59)
GFR, EST NON AFRICAN AMERICAN: 34 mL/min/{1.73_m2} — AB (ref >59)
GLOBULIN, TOTAL: 3.4 g/dL (ref 1.5–4.5)
GLUCOSE: 116 mg/dL — AB (ref 65–99)
Potassium: 4.5 mmol/L (ref 3.5–5.2)
Sodium: 148 mmol/L — ABNORMAL HIGH (ref 134–144)
Total Protein: 7.8 g/dL (ref 6.0–8.5)

## 2018-06-15 LAB — CBC
HEMATOCRIT: 40.4 % (ref 37.5–51.0)
HEMOGLOBIN: 12 g/dL — AB (ref 13.0–17.7)
MCH: 23 pg — ABNORMAL LOW (ref 26.6–33.0)
MCHC: 29.7 g/dL — AB (ref 31.5–35.7)
MCV: 78 fL — AB (ref 79–97)
NRBC: 1 % — AB (ref 0–0)
Platelets: 190 10*3/uL (ref 150–450)
RBC: 5.21 x10E6/uL (ref 4.14–5.80)
RDW: 19.3 % — AB (ref 12.3–15.4)
WBC: 5.9 10*3/uL (ref 3.4–10.8)

## 2018-06-15 LAB — HEMOGLOBIN A1C
ESTIMATED AVERAGE GLUCOSE: 128 mg/dL
HEMOGLOBIN A1C: 6.1 % — AB (ref 4.8–5.6)

## 2018-06-15 LAB — POCT HEMOGLOBIN-HEMACUE: Hemoglobin: 11.9 g/dL — ABNORMAL LOW (ref 13.0–17.0)

## 2018-06-15 MED ORDER — EPOETIN ALFA 10000 UNIT/ML IJ SOLN
INTRAMUSCULAR | Status: AC
  Administered 2018-06-15: 10000 [IU] via SUBCUTANEOUS
  Filled 2018-06-15: qty 1

## 2018-06-15 MED ORDER — EPOETIN ALFA 40000 UNIT/ML IJ SOLN: 30000.0000 [IU] | INTRAMUSCULAR | Status: DC

## 2018-06-15 MED ORDER — SODIUM CHLORIDE 0.9 % IV SOLN
510.0000 mg | Freq: Once | INTRAVENOUS | Status: AC
  Administered 2018-06-15: 510 mg via INTRAVENOUS
  Filled 2018-06-15: qty 17

## 2018-06-15 MED ORDER — EPOETIN ALFA 20000 UNIT/ML IJ SOLN
INTRAMUSCULAR | Status: AC
  Administered 2018-06-15: 20000 [IU]
  Filled 2018-06-15: qty 1

## 2018-06-16 ENCOUNTER — Other Ambulatory Visit: Payer: Self-pay | Admitting: Internal Medicine

## 2018-06-16 DIAGNOSIS — R748 Abnormal levels of other serum enzymes: Secondary | ICD-10-CM

## 2018-06-16 NOTE — Progress Notes (Signed)
Add On labs

## 2018-06-21 ENCOUNTER — Other Ambulatory Visit: Payer: Medicare Other

## 2018-06-21 DIAGNOSIS — R748 Abnormal levels of other serum enzymes: Secondary | ICD-10-CM

## 2018-06-22 LAB — ALKALINE PHOSPHATASE, ISOENZYMES
Alkaline Phosphatase: 126 IU/L — ABNORMAL HIGH (ref 39–117)
BONE FRACTION: 69 % — ABNORMAL HIGH (ref 12–68)
INTESTINAL FRAC.: 0 % (ref 0–18)
LIVER FRACTION: 31 % (ref 13–88)

## 2018-06-23 ENCOUNTER — Other Ambulatory Visit: Payer: Self-pay | Admitting: Internal Medicine

## 2018-06-23 ENCOUNTER — Other Ambulatory Visit: Payer: Self-pay | Admitting: Nurse Practitioner

## 2018-06-28 ENCOUNTER — Other Ambulatory Visit: Payer: Self-pay | Admitting: Internal Medicine

## 2018-06-28 DIAGNOSIS — C61 Malignant neoplasm of prostate: Secondary | ICD-10-CM

## 2018-06-28 NOTE — Progress Notes (Signed)
Lab order for PSA only since Alk Phos- bone part is high and pt has not had one in several years 

## 2018-06-29 ENCOUNTER — Ambulatory Visit (HOSPITAL_COMMUNITY)
Admission: RE | Admit: 2018-06-29 | Discharge: 2018-06-29 | Disposition: A | Discharge status: Home / Self Care | Payer: Medicare Other | Source: Ambulatory Visit | Attending: Nephrology | Admitting: Nephrology

## 2018-06-29 VITALS — BP 122/75 | HR 75 | Temp 98.3°F | Resp 20

## 2018-06-29 DIAGNOSIS — N289 Disorder of kidney and ureter, unspecified: Secondary | ICD-10-CM

## 2018-06-29 DIAGNOSIS — D631 Anemia in chronic kidney disease: Secondary | ICD-10-CM | POA: Insufficient documentation

## 2018-06-29 DIAGNOSIS — N189 Chronic kidney disease, unspecified: Secondary | ICD-10-CM | POA: Diagnosis not present

## 2018-06-29 LAB — IRON AND TIBC
Iron: 72 ug/dL (ref 45–182)
Saturation Ratios: 26 % (ref 17.9–39.5)
TIBC: 279 ug/dL (ref 250–450)
UIBC: 207 ug/dL

## 2018-06-29 LAB — POCT HEMOGLOBIN-HEMACUE: HEMOGLOBIN: 11.9 g/dL — AB (ref 13.0–17.0)

## 2018-06-29 LAB — FERRITIN: Ferritin: 1308 ng/mL — ABNORMAL HIGH (ref 24–336)

## 2018-06-29 MED ORDER — EPOETIN ALFA 10000 UNIT/ML IJ SOLN
INTRAMUSCULAR | Status: AC
  Administered 2018-06-29: 10000 [IU] via SUBCUTANEOUS
  Filled 2018-06-29: qty 1

## 2018-06-29 MED ORDER — EPOETIN ALFA 20000 UNIT/ML IJ SOLN
INTRAMUSCULAR | Status: AC
  Administered 2018-06-29: 20000 [IU] via SUBCUTANEOUS
  Filled 2018-06-29: qty 1

## 2018-06-29 MED ORDER — EPOETIN ALFA 40000 UNIT/ML IJ SOLN: 30000.0000 [IU] | INTRAMUSCULAR | Status: DC

## 2018-06-30 ENCOUNTER — Other Ambulatory Visit: Payer: Medicare Other

## 2018-06-30 ENCOUNTER — Other Ambulatory Visit: Payer: Self-pay | Admitting: Internal Medicine

## 2018-06-30 DIAGNOSIS — R748 Abnormal levels of other serum enzymes: Secondary | ICD-10-CM | POA: Diagnosis not present

## 2018-06-30 DIAGNOSIS — R351 Nocturia: Secondary | ICD-10-CM | POA: Diagnosis not present

## 2018-06-30 DIAGNOSIS — Z125 Encounter for screening for malignant neoplasm of prostate: Secondary | ICD-10-CM

## 2018-06-30 NOTE — Progress Notes (Signed)
PSA added by lab since it would not let me sign

## 2018-07-04 LAB — PROTEIN ELECTROPHORESIS, SERUM, WITH REFLEX
A/G RATIO SPE: 1.1 (ref 0.7–1.7)
Albumin ELP: 3.9 g/dL (ref 2.9–4.4)
Alpha 1: 0.2 g/dL (ref 0.0–0.4)
Alpha 2: 0.9 g/dL (ref 0.4–1.0)
BETA: 0.9 g/dL (ref 0.7–1.3)
GLOBULIN, TOTAL: 3.5 g/dL (ref 2.2–3.9)
Gamma Globulin: 1.5 g/dL (ref 0.4–1.8)
Total Protein: 7.4 g/dL (ref 6.0–8.5)

## 2018-07-04 LAB — PSA: Prostate Specific Ag, Serum: 2.8 ng/mL (ref 0.0–4.0)

## 2018-07-05 DIAGNOSIS — H43393 Other vitreous opacities, bilateral: Secondary | ICD-10-CM | POA: Diagnosis not present

## 2018-07-05 DIAGNOSIS — H353131 Nonexudative age-related macular degeneration, bilateral, early dry stage: Secondary | ICD-10-CM | POA: Diagnosis not present

## 2018-07-05 DIAGNOSIS — E119 Type 2 diabetes mellitus without complications: Secondary | ICD-10-CM | POA: Diagnosis not present

## 2018-07-05 DIAGNOSIS — H35033 Hypertensive retinopathy, bilateral: Secondary | ICD-10-CM | POA: Diagnosis not present

## 2018-07-06 ENCOUNTER — Other Ambulatory Visit: Payer: Self-pay | Admitting: Internal Medicine

## 2018-07-12 ENCOUNTER — Other Ambulatory Visit (HOSPITAL_COMMUNITY): Payer: Self-pay | Admitting: *Deleted

## 2018-07-13 ENCOUNTER — Inpatient Hospital Stay (HOSPITAL_COMMUNITY): Admission: RE | Admit: 2018-07-13 | Payer: Medicare Other | Source: Ambulatory Visit

## 2018-07-19 ENCOUNTER — Ambulatory Visit (HOSPITAL_COMMUNITY)
Admission: RE | Admit: 2018-07-19 | Discharge: 2018-07-19 | Disposition: A | Discharge status: Home / Self Care | Payer: Medicare Other | Source: Ambulatory Visit | Attending: Nephrology | Admitting: Nephrology

## 2018-07-19 VITALS — BP 123/89 | HR 75 | Temp 98.6°F | Resp 20 | Ht 72.0 in | Wt 199.1 lb

## 2018-07-19 DIAGNOSIS — N289 Disorder of kidney and ureter, unspecified: Secondary | ICD-10-CM | POA: Diagnosis not present

## 2018-07-19 LAB — POCT HEMOGLOBIN-HEMACUE: Hemoglobin: 11.8 g/dL — ABNORMAL LOW (ref 13.0–17.0)

## 2018-07-19 MED ORDER — EPOETIN ALFA 10000 UNIT/ML IJ SOLN
INTRAMUSCULAR | Status: AC
  Administered 2018-07-19: 10000 [IU] via SUBCUTANEOUS
  Filled 2018-07-19: qty 1

## 2018-07-19 MED ORDER — EPOETIN ALFA 20000 UNIT/ML IJ SOLN
INTRAMUSCULAR | Status: AC
  Administered 2018-07-19: 20000 [IU] via SUBCUTANEOUS
  Filled 2018-07-19: qty 1

## 2018-07-19 MED ORDER — EPOETIN ALFA 40000 UNIT/ML IJ SOLN: 30000.0000 [IU] | INTRAMUSCULAR | Status: DC

## 2018-07-19 MED ORDER — SODIUM CHLORIDE 0.9 % IV SOLN
510.0000 mg | Freq: Once | INTRAVENOUS | Status: AC
  Administered 2018-07-19: 510 mg via INTRAVENOUS
  Filled 2018-07-19: qty 510

## 2018-07-27 ENCOUNTER — Encounter (HOSPITAL_COMMUNITY): Payer: Medicare Other

## 2018-08-02 ENCOUNTER — Other Ambulatory Visit: Payer: Self-pay | Admitting: Nurse Practitioner

## 2018-08-02 ENCOUNTER — Ambulatory Visit (HOSPITAL_COMMUNITY)
Admission: RE | Admit: 2018-08-02 | Discharge: 2018-08-02 | Disposition: A | Discharge status: Home / Self Care | Payer: Medicare Other | Source: Ambulatory Visit | Attending: Nephrology | Admitting: Nephrology

## 2018-08-02 VITALS — BP 117/69 | HR 54 | Temp 98.1°F | Resp 20

## 2018-08-02 DIAGNOSIS — N289 Disorder of kidney and ureter, unspecified: Secondary | ICD-10-CM

## 2018-08-02 DIAGNOSIS — N183 Chronic kidney disease, stage 3 (moderate): Secondary | ICD-10-CM | POA: Diagnosis not present

## 2018-08-02 DIAGNOSIS — D631 Anemia in chronic kidney disease: Secondary | ICD-10-CM | POA: Insufficient documentation

## 2018-08-02 LAB — IRON AND TIBC
IRON: 79 ug/dL (ref 45–182)
Saturation Ratios: 27 % (ref 17.9–39.5)
TIBC: 290 ug/dL (ref 250–450)
UIBC: 211 ug/dL

## 2018-08-02 LAB — FERRITIN: FERRITIN: 1343 ng/mL — AB (ref 24–336)

## 2018-08-02 LAB — POCT HEMOGLOBIN-HEMACUE: Hemoglobin: 11 g/dL — ABNORMAL LOW (ref 13.0–17.0)

## 2018-08-02 MED ORDER — EPOETIN ALFA 40000 UNIT/ML IJ SOLN: 30000.0000 [IU] | INTRAMUSCULAR | Status: DC

## 2018-08-02 MED ORDER — EPOETIN ALFA 10000 UNIT/ML IJ SOLN
INTRAMUSCULAR | Status: AC
  Administered 2018-08-02: 10000 [IU] via SUBCUTANEOUS
  Filled 2018-08-02: qty 1

## 2018-08-02 MED ORDER — EPOETIN ALFA 20000 UNIT/ML IJ SOLN
INTRAMUSCULAR | Status: AC
  Administered 2018-08-02: 20000 [IU] via SUBCUTANEOUS
  Filled 2018-08-02: qty 1

## 2018-08-08 ENCOUNTER — Ambulatory Visit: Payer: Self-pay | Admitting: Internal Medicine

## 2018-08-15 ENCOUNTER — Other Ambulatory Visit (HOSPITAL_COMMUNITY): Payer: Self-pay | Admitting: *Deleted

## 2018-08-16 ENCOUNTER — Ambulatory Visit (HOSPITAL_COMMUNITY)
Admission: RE | Admit: 2018-08-16 | Discharge: 2018-08-16 | Disposition: A | Discharge status: Home / Self Care | Payer: Medicare Other | Source: Ambulatory Visit | Attending: Nephrology | Admitting: Nephrology

## 2018-08-16 VITALS — BP 131/77 | HR 79 | Temp 98.7°F | Resp 18

## 2018-08-16 DIAGNOSIS — N289 Disorder of kidney and ureter, unspecified: Secondary | ICD-10-CM | POA: Diagnosis not present

## 2018-08-16 MED ORDER — EPOETIN ALFA 10000 UNIT/ML IJ SOLN
INTRAMUSCULAR | Status: AC
  Administered 2018-08-16: 10000 [IU] via SUBCUTANEOUS
  Filled 2018-08-16: qty 1

## 2018-08-16 MED ORDER — EPOETIN ALFA 40000 UNIT/ML IJ SOLN: 30000.0000 [IU] | INTRAMUSCULAR | Status: DC

## 2018-08-16 MED ORDER — SODIUM CHLORIDE 0.9 % IV SOLN
510.0000 mg | Freq: Once | INTRAVENOUS | Status: AC
  Administered 2018-08-16: 510 mg via INTRAVENOUS
  Filled 2018-08-16: qty 510

## 2018-08-16 MED ORDER — EPOETIN ALFA 20000 UNIT/ML IJ SOLN
INTRAMUSCULAR | Status: AC
  Administered 2018-08-16: 20000 [IU] via SUBCUTANEOUS
  Filled 2018-08-16: qty 1

## 2018-08-19 ENCOUNTER — Telehealth: Payer: Self-pay | Admitting: Nurse Practitioner

## 2018-08-19 ENCOUNTER — Other Ambulatory Visit: Payer: Self-pay

## 2018-08-19 LAB — POCT HEMOGLOBIN-HEMACUE: HEMOGLOBIN: 11 g/dL — AB (ref 13.0–17.0)

## 2018-08-19 MED ORDER — GLUCOSE BLOOD VI STRP: 1.0000 | ORAL_STRIP | 11 refills | Status: DC | PRN

## 2018-08-19 NOTE — Telephone Encounter (Signed)
done

## 2018-08-19 NOTE — Telephone Encounter (Signed)
PT REQ REFILLS OF TRUE METRIX TEST STRIPS, PLEASE REFER TO NEXTGEN, NOT ON PT MEDICATION LIST IN EPIC, REFILL CAN BE SENT TO PHARMACY ON FILE

## 2018-08-29 ENCOUNTER — Other Ambulatory Visit: Payer: Self-pay | Admitting: Internal Medicine

## 2018-08-30 ENCOUNTER — Ambulatory Visit (HOSPITAL_COMMUNITY)
Admission: RE | Admit: 2018-08-30 | Discharge: 2018-08-30 | Disposition: A | Discharge status: Home / Self Care | Payer: Medicare Other | Source: Ambulatory Visit | Attending: Nephrology | Admitting: Nephrology

## 2018-08-30 VITALS — BP 137/82 | HR 81 | Temp 98.4°F | Resp 20

## 2018-08-30 DIAGNOSIS — N289 Disorder of kidney and ureter, unspecified: Secondary | ICD-10-CM | POA: Insufficient documentation

## 2018-08-30 LAB — IRON AND TIBC
Iron: 67 ug/dL (ref 45–182)
SATURATION RATIOS: 24 % (ref 17.9–39.5)
TIBC: 280 ug/dL (ref 250–450)
UIBC: 213 ug/dL

## 2018-08-30 LAB — POCT HEMOGLOBIN-HEMACUE: Hemoglobin: 10.1 g/dL — ABNORMAL LOW (ref 13.0–17.0)

## 2018-08-30 LAB — FERRITIN: Ferritin: 1624 ng/mL — ABNORMAL HIGH (ref 24–336)

## 2018-08-30 MED ORDER — EPOETIN ALFA 20000 UNIT/ML IJ SOLN
INTRAMUSCULAR | Status: AC
  Administered 2018-08-30: 20000 [IU] via SUBCUTANEOUS
  Filled 2018-08-30: qty 1

## 2018-08-30 MED ORDER — EPOETIN ALFA 10000 UNIT/ML IJ SOLN
INTRAMUSCULAR | Status: AC
  Administered 2018-08-30: 10000 [IU] via SUBCUTANEOUS
  Filled 2018-08-30: qty 1

## 2018-08-30 MED ORDER — EPOETIN ALFA 40000 UNIT/ML IJ SOLN: 30000.0000 [IU] | INTRAMUSCULAR | Status: DC

## 2018-08-31 ENCOUNTER — Other Ambulatory Visit: Payer: Self-pay | Admitting: Internal Medicine

## 2018-09-13 ENCOUNTER — Ambulatory Visit (HOSPITAL_COMMUNITY)
Admission: RE | Admit: 2018-09-13 | Discharge: 2018-09-13 | Disposition: A | Discharge status: Home / Self Care | Payer: Medicare Other | Source: Ambulatory Visit | Attending: Nephrology | Admitting: Nephrology

## 2018-09-13 VITALS — BP 147/60 | HR 85 | Temp 98.0°F | Resp 16

## 2018-09-13 DIAGNOSIS — N289 Disorder of kidney and ureter, unspecified: Secondary | ICD-10-CM | POA: Diagnosis not present

## 2018-09-13 LAB — POCT HEMOGLOBIN-HEMACUE: HEMOGLOBIN: 10 g/dL — AB (ref 13.0–17.0)

## 2018-09-13 MED ORDER — EPOETIN ALFA 20000 UNIT/ML IJ SOLN
INTRAMUSCULAR | Status: AC
  Administered 2018-09-13: 20000 [IU] via SUBCUTANEOUS
  Filled 2018-09-13: qty 1

## 2018-09-13 MED ORDER — SODIUM CHLORIDE 0.9 % IV SOLN
510.0000 mg | Freq: Once | INTRAVENOUS | Status: AC
  Administered 2018-09-13: 510 mg via INTRAVENOUS
  Filled 2018-09-13: qty 510

## 2018-09-13 MED ORDER — EPOETIN ALFA 40000 UNIT/ML IJ SOLN: 30000.0000 [IU] | INTRAMUSCULAR | Status: DC

## 2018-09-13 MED ORDER — EPOETIN ALFA 10000 UNIT/ML IJ SOLN
INTRAMUSCULAR | Status: AC
  Administered 2018-09-13: 10000 [IU] via SUBCUTANEOUS
  Filled 2018-09-13: qty 1

## 2018-09-20 DIAGNOSIS — N183 Chronic kidney disease, stage 3 (moderate): Secondary | ICD-10-CM | POA: Diagnosis not present

## 2018-09-20 DIAGNOSIS — K219 Gastro-esophageal reflux disease without esophagitis: Secondary | ICD-10-CM | POA: Diagnosis not present

## 2018-09-20 DIAGNOSIS — I129 Hypertensive chronic kidney disease with stage 1 through stage 4 chronic kidney disease, or unspecified chronic kidney disease: Secondary | ICD-10-CM | POA: Diagnosis not present

## 2018-09-20 DIAGNOSIS — M109 Gout, unspecified: Secondary | ICD-10-CM | POA: Diagnosis not present

## 2018-09-20 DIAGNOSIS — N400 Enlarged prostate without lower urinary tract symptoms: Secondary | ICD-10-CM | POA: Diagnosis not present

## 2018-09-20 DIAGNOSIS — N2589 Other disorders resulting from impaired renal tubular function: Secondary | ICD-10-CM | POA: Diagnosis not present

## 2018-09-20 DIAGNOSIS — E785 Hyperlipidemia, unspecified: Secondary | ICD-10-CM | POA: Diagnosis not present

## 2018-09-20 DIAGNOSIS — D631 Anemia in chronic kidney disease: Secondary | ICD-10-CM | POA: Diagnosis not present

## 2018-09-20 DIAGNOSIS — E1122 Type 2 diabetes mellitus with diabetic chronic kidney disease: Secondary | ICD-10-CM | POA: Diagnosis not present

## 2018-09-20 DIAGNOSIS — N2581 Secondary hyperparathyroidism of renal origin: Secondary | ICD-10-CM | POA: Diagnosis not present

## 2018-09-20 DIAGNOSIS — K279 Peptic ulcer, site unspecified, unspecified as acute or chronic, without hemorrhage or perforation: Secondary | ICD-10-CM | POA: Diagnosis not present

## 2018-09-20 DIAGNOSIS — R195 Other fecal abnormalities: Secondary | ICD-10-CM | POA: Diagnosis not present

## 2018-09-27 ENCOUNTER — Ambulatory Visit (HOSPITAL_COMMUNITY)
Admission: RE | Admit: 2018-09-27 | Discharge: 2018-09-27 | Disposition: A | Discharge status: Home / Self Care | Payer: Medicare Other | Source: Ambulatory Visit | Attending: Nephrology | Admitting: Nephrology

## 2018-09-27 VITALS — BP 131/61 | HR 42 | Temp 98.0°F | Resp 20

## 2018-09-27 DIAGNOSIS — N289 Disorder of kidney and ureter, unspecified: Secondary | ICD-10-CM | POA: Diagnosis not present

## 2018-09-27 LAB — IRON AND TIBC
Iron: 69 ug/dL (ref 45–182)
Saturation Ratios: 25 % (ref 17.9–39.5)
TIBC: 281 ug/dL (ref 250–450)
UIBC: 212 ug/dL

## 2018-09-27 LAB — FERRITIN: Ferritin: 1447 ng/mL — ABNORMAL HIGH (ref 24–336)

## 2018-09-27 LAB — POCT HEMOGLOBIN-HEMACUE: Hemoglobin: 9.4 g/dL — ABNORMAL LOW (ref 13.0–17.0)

## 2018-09-27 MED ORDER — EPOETIN ALFA 40000 UNIT/ML IJ SOLN: 30000.0000 [IU] | INTRAMUSCULAR | Status: DC

## 2018-09-27 MED ORDER — EPOETIN ALFA 10000 UNIT/ML IJ SOLN
INTRAMUSCULAR | Status: AC
  Administered 2018-09-27: 10000 [IU]
  Filled 2018-09-27: qty 1

## 2018-09-27 MED ORDER — EPOETIN ALFA 20000 UNIT/ML IJ SOLN
INTRAMUSCULAR | Status: AC
  Administered 2018-09-27: 20000 [IU] via SUBCUTANEOUS
  Filled 2018-09-27: qty 1

## 2018-10-04 ENCOUNTER — Other Ambulatory Visit (HOSPITAL_COMMUNITY): Payer: Self-pay | Admitting: *Deleted

## 2018-10-05 ENCOUNTER — Other Ambulatory Visit: Payer: Self-pay | Admitting: Internal Medicine

## 2018-10-05 ENCOUNTER — Encounter (HOSPITAL_COMMUNITY)
Admission: RE | Admit: 2018-10-05 | Discharge: 2018-10-05 | Disposition: A | Discharge status: Home / Self Care | Payer: Medicare Other | Source: Ambulatory Visit | Attending: Nephrology | Admitting: Nephrology

## 2018-10-05 DIAGNOSIS — D631 Anemia in chronic kidney disease: Secondary | ICD-10-CM | POA: Insufficient documentation

## 2018-10-05 MED ORDER — SODIUM CHLORIDE 0.9 % IV SOLN
510.0000 mg | Freq: Once | INTRAVENOUS | Status: AC
  Administered 2018-10-05: 510 mg via INTRAVENOUS
  Filled 2018-10-05: qty 510

## 2018-10-10 ENCOUNTER — Other Ambulatory Visit: Payer: Self-pay | Admitting: Internal Medicine

## 2018-10-11 ENCOUNTER — Ambulatory Visit (HOSPITAL_COMMUNITY)
Admission: RE | Admit: 2018-10-11 | Discharge: 2018-10-11 | Disposition: A | Discharge status: Home / Self Care | Payer: Medicare Other | Source: Ambulatory Visit | Attending: Nephrology | Admitting: Nephrology

## 2018-10-11 ENCOUNTER — Other Ambulatory Visit: Payer: Self-pay | Admitting: Internal Medicine

## 2018-10-11 VITALS — BP 112/71 | HR 75 | Temp 98.3°F | Resp 20

## 2018-10-11 DIAGNOSIS — N289 Disorder of kidney and ureter, unspecified: Secondary | ICD-10-CM | POA: Diagnosis not present

## 2018-10-11 MED ORDER — EPOETIN ALFA 20000 UNIT/ML IJ SOLN
INTRAMUSCULAR | Status: AC
  Administered 2018-10-11: 20000 [IU]
  Filled 2018-10-11: qty 1

## 2018-10-11 MED ORDER — EPOETIN ALFA 10000 UNIT/ML IJ SOLN
INTRAMUSCULAR | Status: AC
  Administered 2018-10-11: 10000 [IU]
  Filled 2018-10-11: qty 1

## 2018-10-11 MED ORDER — EPOETIN ALFA 40000 UNIT/ML IJ SOLN: 30000.0000 [IU] | INTRAMUSCULAR | Status: DC

## 2018-10-12 LAB — POCT HEMOGLOBIN-HEMACUE: Hemoglobin: 10.7 g/dL — ABNORMAL LOW (ref 13.0–17.0)

## 2018-10-18 ENCOUNTER — Other Ambulatory Visit: Payer: Self-pay

## 2018-10-18 ENCOUNTER — Ambulatory Visit (INDEPENDENT_AMBULATORY_CARE_PROVIDER_SITE_OTHER): Payer: Medicare Other | Admitting: Internal Medicine

## 2018-10-18 ENCOUNTER — Encounter: Payer: Self-pay | Admitting: Internal Medicine

## 2018-10-18 ENCOUNTER — Ambulatory Visit: Payer: Medicare Other | Admitting: Internal Medicine

## 2018-10-18 VITALS — BP 134/72 | HR 62 | Temp 98.1°F | Ht 70.8 in | Wt 206.8 lb

## 2018-10-18 DIAGNOSIS — E1165 Type 2 diabetes mellitus with hyperglycemia: Secondary | ICD-10-CM

## 2018-10-18 DIAGNOSIS — I10 Essential (primary) hypertension: Secondary | ICD-10-CM | POA: Diagnosis not present

## 2018-10-18 NOTE — Patient Instructions (Signed)
PLEASE EAT POTATOES AND CEREAL ONLY ONCE A WEEK  INSTEAD OF CEREAL HAVE ANY KIND OF BERRIES, WITH EGGS OR [place] SAUSAGE OR LINKS.   AVOID ANY KIND OF SWEETS.

## 2018-10-18 NOTE — Progress Notes (Signed)
Subjective:     Patient ID: Alec Solis , male    DOB: 10/14/1932 , 85 y.o.   MRN: [id number]   Chief Complaint  Patient presents with  . Diabetes    HPI Pt is here for DM FU and his glucose has been eratic from 90-200. Eats cold and hot cereal 3x/week and eats a lot of potatoes lately.     Past Medical History:  Diagnosis Date  . Anemia   . Anxiety   . Arthritis   . Cataract   . Chronic kidney disease   . Depression   . Diabetes mellitus without complication ([place])    type II   . GERD (gastroesophageal reflux disease)   . Gout   . Heart murmur   . Hypertension   . Myocardial infarction ([place])    minor Mi- years ago -   . Varicose veins of left lower extremity      Family History  Problem Relation Age of Onset  . Breast cancer Mother   . Stomach cancer Mother   . Diabetes Mellitus II Maternal Aunt      Current Outpatient Medications:  .  acetaminophen (TYLENOL) 500 MG tablet, Take 2 tablets (1,000 mg total) by mouth every 8 (eight) hours as needed. (Patient taking differently: Take 500 mg by mouth every 8 (eight) hours as needed for moderate pain. ), Disp: 30 tablet, Rfl: 0 .  allopurinol (ZYLOPRIM) 100 MG tablet, TAKE 1 TABLET BY MOUTH TWICE DAILY, Disp: 180 tablet, Rfl: 0 .  amLODipine (NORVASC) 5 MG tablet, TAKE 1 TABLET BY MOUTH DAILY, Disp: 90 tablet, Rfl: 0 .  aspirin 81 MG tablet, Take 81 mg by mouth daily., Disp: , Rfl:  .  B-D ULTRAFINE III SHORT PEN 31G X 8 MM MISC, USE AS DIRECTED TO CHECK BLOOD SUGAR FOUR TIMES DAILY, Disp: 100 each, Rfl: 3 .  calcitRIOL (ROCALTROL) 0.25 MCG capsule, Take 0.25 mcg by mouth See admin instructions. Monday Thursday Sunday,  Pt takes every 3rd day, Disp: , Rfl:  .  carboxymethylcellulose (REFRESH PLUS) 0.5 % SOLN, 1 drop 3 (three) times daily as needed. As needed, Disp: , Rfl:  .  carvedilol (COREG) 25 MG tablet, Take 12.5 mg by mouth 2 (two) times daily with a meal. , Disp: , Rfl:  .  clopidogrel (PLAVIX) 75 MG tablet, TAKE  1 TABLET BY MOUTH DAILY, Disp: 90 tablet, Rfl: 0 .  feeding supplement, GLUCERNA SHAKE, (GLUCERNA SHAKE) LIQD, Take 237 mLs by mouth 2 (two) times daily between meals., Disp: 60 Can, Rfl: 0 .  Ferrous Gluconate (IRON 27 PO), Take by mouth. Patient takes as needed, Disp: , Rfl:  .  furosemide (LASIX) 40 MG tablet, Take 40 mg by mouth daily., Disp: , Rfl:  .  Ginger, Zingiber officinalis, (GINGER ROOT) 550 MG CAPS, Take by mouth. Patient takes prn, Disp: , Rfl:  .  glucose blood (TRUE METRIX BLOOD GLUCOSE TEST) test strip, 1 each by Other route as needed for other. Use as directed to check blood sugars, Disp: 150 each, Rfl: 11 .  insulin aspart (NOVOLOG FLEXPEN) 100 UNIT/ML FlexPen, Inject 6 Units into the skin 3 (three) times daily with meals. (Patient taking differently: Inject 2 Units into the skin 3 (three) times daily with meals. ), Disp: 15 mL, Rfl: 0 .  Insulin Degludec (TRESIBA [place]), Inject 20 Units into the skin daily., Disp: , Rfl:  .  Insulin Pen Needle 32G X 8 MM MISC, Use as directed, Disp: 100  each, Rfl: 0 .  omeprazole (PRILOSEC) 40 MG capsule, TAKE 1 CAPSULE BY MOUTH EVERY DAY BEFORE A MEAL, Disp: 90 capsule, Rfl: 1 .  sodium bicarbonate 650 MG tablet, Take 1,300 mg by mouth 2 (two) times daily., Disp: , Rfl:  .  tamsulosin (FLOMAX) 0.4 MG CAPS capsule, TAKE ONE CAPSULE BY MOUTH TWICE DAILY, Disp: 180 capsule, Rfl: 0 .  TRESIBA FLEXTOUCH 200 UNIT/ML SOPN, INJECT 20 UNITS SUBCUTANEOUS EVERY NIGHT AT BEDTIME. MAX 50 UNITS PER DAY, Disp: 9 pen, Rfl: 1 .  TRUEPLUS LANCETS 30G MISC, USE AS DIRECTED TO CHECK BLOOD SUGAR THREE TIMES DAILY, Disp: 300 each, Rfl: 11   No Known Allergies   Review of Systems  Constitutional: Positive for diaphoresis and fatigue.       Gets night sweats for years  Respiratory: Negative for choking and shortness of breath.   Cardiovascular: Positive for leg swelling. Negative for chest pain.  Gastrointestinal: Positive for constipation. Negative for diarrhea.        Gets intermittent constipation, but never goes longer than 1 day without a BM  Endocrine: Positive for polyuria. Negative for polydipsia and polyphagia.  Genitourinary: Negative for dysuria.       Has nocturia  Skin: Negative for rash and wound.  Neurological: Negative for speech difficulty, weakness, numbness and headaches.  Hematological: Negative for adenopathy.     Today's Vitals   10/18/18 1143  BP: 134/72  Pulse: 62  Temp: 98.1 F (36.7 C)  TempSrc: Oral  SpO2: 98%  Weight: 206 lb 12.8 oz (93.8 kg)  Height: 5' 10.8" (1.798 m)   Body mass index is 29.01 kg/m.   Objective:  Physical Exam  Constitutional: She is oriented to person, place, and time. She appears well-developed and well-nourished. No distress.  HENT:  Head: Normocephalic and atraumatic.  Right Ear: External ear normal.  Left Ear: External ear normal.  Nose: Nose normal.  Eyes: Conjunctivae are normal. Right eye exhibits no discharge. Left eye exhibits no discharge. No scleral icterus.  Neck: Neck supple. No thyromegaly present.  No carotid bruits bilaterally  Cardiovascular: Normal rate and regular rhythm.  No murmur heard. Pulmonary/Chest: Effort normal and breath sounds normal. No respiratory distress.  Musculoskeletal: Normal range of motion. he exhibits no edema. Has callus on the center of his L foot ball, no wound or ulcers noted. His great toenails are yellow and thick bilaterally.  Lymphadenopathy: he has no cervical adenopathy.  Neurological: he is alert and oriented to person, place, and time.  Skin: Skin is warm and dry. +1/4 PEDAL PULSES.  No rash noted. he is not diaphoretic.  Psychiatric: he has a normal mood and affect. His behavior is normal. Judgment and thought content normal.  Nursing note reviewed.  Assessment And Plan:     1. Uncontrolled type 2 diabetes mellitus with hyperglycemia ([place])- chronic. We will see his labs before we adjust his med if needed.  - Ambulatory referral to  Podiatry - Hemoglobin A1c - Lipid Profile   He was not able to void for [place] today.  I sent him to podiatry for DM foot care.  2. Hypertension, essential- stable. May continue the same meds.  - CMP14 + Anion Gap - CBC no Diff - Lipid Profile  FU in 3 months.   Pearlena Ow RODRIGUEZ-SOUTHWORTH, PA-C

## 2018-10-19 LAB — CMP14 + ANION GAP
ALT: 13 IU/L (ref 0–44)
AST: 17 IU/L (ref 0–40)
Albumin/Globulin Ratio: 1.3 (ref 1.2–2.2)
Albumin: 4.3 g/dL (ref 3.6–4.6)
Alkaline Phosphatase: 167 IU/L — ABNORMAL HIGH (ref 39–117)
Anion Gap: 15 mmol/L (ref 10.0–18.0)
BUN/Creatinine Ratio: 16 (ref 10–24)
BUN: 32 mg/dL — ABNORMAL HIGH (ref 8–27)
Bilirubin Total: 0.2 mg/dL (ref 0.0–1.2)
CO2: 22 mmol/L (ref 20–29)
Calcium: 9.5 mg/dL (ref 8.6–10.2)
Chloride: 107 mmol/L — ABNORMAL HIGH (ref 96–106)
Creatinine, Ser: 2.02 mg/dL — ABNORMAL HIGH (ref 0.76–1.27)
GFR calc non Af Amer: 30 mL/min/{1.73_m2} — ABNORMAL LOW (ref >59)
GFR, EST AFRICAN AMERICAN: 34 mL/min/{1.73_m2} — AB (ref >59)
Globulin, Total: 3.2 g/dL (ref 1.5–4.5)
Glucose: 217 mg/dL — ABNORMAL HIGH (ref 65–99)
Potassium: 4.7 mmol/L (ref 3.5–5.2)
Sodium: 144 mmol/L (ref 134–144)
TOTAL PROTEIN: 7.5 g/dL (ref 6.0–8.5)

## 2018-10-19 LAB — CBC
HEMATOCRIT: 35.7 % — AB (ref 37.5–51.0)
Hemoglobin: 10.6 g/dL — ABNORMAL LOW (ref 13.0–17.7)
MCH: 24 pg — ABNORMAL LOW (ref 26.6–33.0)
MCHC: 29.7 g/dL — ABNORMAL LOW (ref 31.5–35.7)
MCV: 81 fL (ref 79–97)
NRBC: 21 % — ABNORMAL HIGH (ref 0–0)
Platelets: 266 10*3/uL (ref 150–450)
RBC: 4.42 x10E6/uL (ref 4.14–5.80)
RDW: 17.1 % — ABNORMAL HIGH (ref 11.6–15.4)
WBC: 6.1 10*3/uL (ref 3.4–10.8)

## 2018-10-19 LAB — HEMOGLOBIN A1C
Est. average glucose Bld gHb Est-mCnc: 146 mg/dL
Hgb A1c MFr Bld: 6.7 % — ABNORMAL HIGH (ref 4.8–5.6)

## 2018-10-19 LAB — LIPID PANEL
CHOLESTEROL TOTAL: 178 mg/dL (ref 100–199)
Chol/HDL Ratio: 7.1 ratio — ABNORMAL HIGH (ref 0.0–5.0)
HDL: 25 mg/dL — ABNORMAL LOW (ref >39)
Triglycerides: 449 mg/dL — ABNORMAL HIGH (ref 0–149)

## 2018-10-20 ENCOUNTER — Other Ambulatory Visit: Payer: Self-pay | Admitting: Internal Medicine

## 2018-10-20 MED ORDER — INSULIN DEGLUDEC 200 UNIT/ML ~~LOC~~ SOPN: 22.0000 [IU] | PEN_INJECTOR | Freq: Every day | SUBCUTANEOUS | 0 refills | Status: DC

## 2018-10-20 MED ORDER — ICOSAPENT ETHYL 1 G PO CAPS: ORAL_CAPSULE | ORAL | 1 refills | Status: DC

## 2018-10-20 NOTE — Progress Notes (Signed)
Vascepa rx sent since triglycerides are so high.

## 2018-10-23 ENCOUNTER — Ambulatory Visit (INDEPENDENT_AMBULATORY_CARE_PROVIDER_SITE_OTHER): Payer: Medicare Other

## 2018-10-23 ENCOUNTER — Ambulatory Visit (HOSPITAL_COMMUNITY)
Admission: EM | Admit: 2018-10-23 | Discharge: 2018-10-23 | Disposition: A | Discharge status: Home / Self Care | Payer: Medicare Other | Attending: Family Medicine | Admitting: Family Medicine

## 2018-10-23 ENCOUNTER — Encounter (HOSPITAL_COMMUNITY): Payer: Self-pay | Admitting: Emergency Medicine

## 2018-10-23 DIAGNOSIS — S42401A Unspecified fracture of lower end of right humerus, initial encounter for closed fracture: Secondary | ICD-10-CM | POA: Diagnosis not present

## 2018-10-23 DIAGNOSIS — M79641 Pain in right hand: Secondary | ICD-10-CM | POA: Diagnosis not present

## 2018-10-23 DIAGNOSIS — W19XXXA Unspecified fall, initial encounter: Secondary | ICD-10-CM

## 2018-10-23 DIAGNOSIS — M25521 Pain in right elbow: Secondary | ICD-10-CM

## 2018-10-23 DIAGNOSIS — M25421 Effusion, right elbow: Secondary | ICD-10-CM | POA: Diagnosis not present

## 2018-10-23 DIAGNOSIS — S6991XA Unspecified injury of right wrist, hand and finger(s), initial encounter: Secondary | ICD-10-CM | POA: Diagnosis not present

## 2018-10-23 DIAGNOSIS — M889 Osteitis deformans of unspecified bone: Secondary | ICD-10-CM

## 2018-10-23 MED ORDER — HYDROCODONE-ACETAMINOPHEN 5-325 MG PO TABS
1.0000 | ORAL_TABLET | Freq: Once | ORAL | Status: AC
  Administered 2018-10-23: 1 via ORAL

## 2018-10-23 MED ORDER — HYDROCODONE-ACETAMINOPHEN 5-325 MG PO TABS
ORAL_TABLET | ORAL | Status: AC
  Filled 2018-10-23: qty 1

## 2018-10-23 MED ORDER — HYDROCODONE-ACETAMINOPHEN 5-325 MG PO TABS: 1.0000 | ORAL_TABLET | Freq: Four times a day (QID) | ORAL | 0 refills | Status: DC | PRN

## 2018-10-23 NOTE — Progress Notes (Signed)
Orthopedic Tech Progress Note Patient Details:  Alec Solis 12-08-1932 [id number]  Ortho Devices Type of Ortho Device: Post (long arm) splint, Arm sling Ortho Device/Splint Location: right arm Ortho Device/Splint Interventions: Adjustment, Application, Ordered   Post Interventions Patient Tolerated: Well Instructions Provided: Care of device, Adjustment of device   Janit Pagan 10/23/2018, 2:04 PM

## 2018-10-23 NOTE — ED Provider Notes (Signed)
[facility]    CSN: [id number] Arrival date & time: 10/23/18  1130     History   Chief Complaint Chief Complaint  Patient presents with  . Fall  . Elbow Pain    HPI Alec Solis is a 85 y.o. male.   HPI  Patient is here for right arm injury.  He fell yesterday coming out of a grocery store, missed a curb and fell forward.  Landed on his outstretched right fist.  He has an abrasion on the fifth metacarpal knuckle and pain with finger range of motion.  Wrist is stiff and moderately painful.  Elbow is very painful with any movement.  His wife is wrapped in an Ace wrap and he is in a sling.  He had trouble sleeping last night secondary to the pain.  No numbness or weakness in the arm.  Patient did not injure his neck or head, states his right arm is his only issue today. He is an insulin-dependent diabetic.  Good control.  Hypertension.  He also has kidney failure with his most recent GFR being 34.  Chronic anemia.  He is here with his wife who is driving  Past Medical History:  Diagnosis Date  . Anemia   . Anxiety   . Arthritis   . Cataract   . Chronic kidney disease   . Depression   . Diabetes mellitus without complication ([place])    type II   . GERD (gastroesophageal reflux disease)   . Gout   . Heart murmur   . Hypertension   . Myocardial infarction ([place])    minor Mi- years ago -   . Varicose veins of left lower extremity     Patient Active Problem List   Diagnosis Date Noted  . Paget's disease of bone 10/23/2018  . Acute renal failure (ARF) ([place]) 06/11/2016  . Hyperglycemia 06/11/2016  . Deficiency anemia 12/09/2011  . Diabetes mellitus ([place]) 12/09/2011  . Renal insufficiency 12/09/2011    Past Surgical History:  Procedure Laterality Date  . BUNIONECTOMY    . cataract    . COLONOSCOPY WITH PROPOFOL N/A 05/28/2017   Procedure: COLONOSCOPY WITH PROPOFOL;  Surgeon: Carol Ada, MD;  Location: [facility];  Service: Endoscopy;  Laterality: N/A;  .  ESOPHAGOGASTRODUODENOSCOPY (EGD) WITH PROPOFOL N/A 05/28/2017   Procedure: ESOPHAGOGASTRODUODENOSCOPY (EGD) WITH PROPOFOL;  Surgeon: Carol Ada, MD;  Location: [facility];  Service: Endoscopy;  Laterality: N/A;       Home Medications    Prior to Admission medications   Medication Sig Start Date End Date Taking? Authorizing Provider  Omega-3 Fatty Acids (FISH OIL PO) Take by mouth.   Yes [provider]  acetaminophen (TYLENOL) 500 MG tablet Take 2 tablets (1,000 mg total) by mouth every 8 (eight) hours as needed. Patient taking differently: Take 500 mg by mouth every 8 (eight) hours as needed for moderate pain.  06/14/16   Ghimire, Henreitta Leber, MD  allopurinol (ZYLOPRIM) 100 MG tablet TAKE 1 TABLET BY MOUTH TWICE DAILY 10/10/18   [name], MD  amLODipine (NORVASC) 5 MG tablet TAKE 1 TABLET BY MOUTH DAILY 10/06/18   [name], MD  aspirin 81 MG tablet Take 81 mg by mouth daily.    [provider]  B-D ULTRAFINE III SHORT PEN 31G X 8 MM MISC USE AS DIRECTED TO CHECK BLOOD SUGAR FOUR TIMES DAILY 08/31/18   [name], MD  calcitRIOL (ROCALTROL) 0.25 MCG capsule Take 0.25 mcg by mouth See admin instructions. Monday  Thursday Sunday,  Pt takes every 3rd day    [provider]  carboxymethylcellulose (REFRESH PLUS) 0.5 % SOLN 1 drop 3 (three) times daily as needed. As needed    [provider]  carvedilol (COREG) 25 MG tablet Take 12.5 mg by mouth 2 (two) times daily with a meal.     [provider]  clopidogrel (PLAVIX) 75 MG tablet TAKE 1 TABLET BY MOUTH DAILY 08/02/18   [name], MD  feeding supplement, GLUCERNA SHAKE, (GLUCERNA SHAKE) LIQD Take 237 mLs by mouth 2 (two) times daily between meals. 06/14/16   Ghimire, Henreitta Leber, MD  Ferrous Gluconate (IRON 27 PO) Take by mouth. Patient takes as needed    [provider]  furosemide (LASIX) 40 MG tablet Take 40 mg by mouth daily.    [provider]  Ginger, Zingiber  officinalis, (GINGER ROOT) 550 MG CAPS Take by mouth. Patient takes prn    [provider]  glucose blood (TRUE METRIX BLOOD GLUCOSE TEST) test strip 1 each by Other route as needed for other. Use as directed to check blood sugars 08/19/18   Rodriguez-Southworth, Sunday Spillers, PA-C  HYDROcodone-acetaminophen (NORCO/VICODIN) 5-325 MG tablet Take 1-2 tablets by mouth every 6 (six) hours as needed. 10/23/18   Raylene Everts, MD  Icosapent Ethyl (VASCEPA) 1 g CAPS 2 bid 10/20/18   Rodriguez-Southworth, Sunday Spillers, PA-C  insulin aspart (NOVOLOG FLEXPEN) 100 UNIT/ML FlexPen Inject 6 Units into the skin 3 (three) times daily with meals. Patient taking differently: Inject 2 Units into the skin 3 (three) times daily with meals.  06/14/16   Ghimire, Henreitta Leber, MD  Insulin Degludec (TRESIBA FLEXTOUCH) 200 UNIT/ML SOPN Inject 22 Units into the skin daily. 10/20/18   Rodriguez-Southworth, Sunday Spillers, PA-C  Insulin Degludec (TRESIBA [place]) Inject 20 Units into the skin daily.    [provider]  Insulin Pen Needle 32G X 8 MM MISC Use as directed 06/14/16   Ghimire, Henreitta Leber, MD  omeprazole (PRILOSEC) 40 MG capsule TAKE 1 CAPSULE BY MOUTH EVERY DAY BEFORE A MEAL 10/11/18   [name], MD  sodium bicarbonate 650 MG tablet Take 1,300 mg by mouth 2 (two) times daily.    [provider]  tamsulosin (FLOMAX) 0.4 MG CAPS capsule TAKE ONE CAPSULE BY MOUTH TWICE DAILY 06/24/18   [name], MD  TRUEPLUS LANCETS 30G MISC USE AS DIRECTED TO CHECK BLOOD SUGAR THREE TIMES DAILY 08/29/18   [name], MD    Family History Family History  Problem Relation Age of Onset  . Breast cancer Mother   . Stomach cancer Mother   . Diabetes Mellitus II Maternal Aunt     Social History Social History   Tobacco Use  . Smoking status: Former Smoker    Packs/day: 0.25    Years: 50.00    Pack years: 12.50    Types: Cigarettes  . Smokeless tobacco: Never Used  Substance Use Topics  . Alcohol use: No  . Drug  use: No     Allergies   Patient has no known allergies.   Review of Systems Review of Systems  Constitutional: Negative for chills and fever.  HENT: Negative for ear pain and sore throat.   Eyes: Negative for pain and visual disturbance.  Respiratory: Negative for cough and shortness of breath.   Cardiovascular: Negative for chest pain and palpitations.  Gastrointestinal: Negative for abdominal pain and vomiting.  Genitourinary: Negative for dysuria and hematuria.  Musculoskeletal: Positive for arthralgias and joint swelling. Negative  for back pain.  Skin: Positive for wound. Negative for color change and rash.  Neurological: Negative for seizures and syncope.  All other systems reviewed and are negative.    Physical Exam Triage Vital Signs ED Triage Vitals  Enc Vitals Group     BP 10/23/18 1210 134/85     Pulse Rate 10/23/18 1210 83     Resp 10/23/18 1210 16     Temp 10/23/18 1210 98.5 F (36.9 C)     Temp src --      SpO2 10/23/18 1210 100 %     Weight --      Height --      Head Circumference --      Peak Flow --      Pain Score 10/23/18 1212 7     Pain Loc --      Pain Edu? --      Excl. in [place]? --    No data found.  Updated Vital Signs BP 134/85   Pulse 83   Temp 98.5 F (36.9 C)   Resp 16   SpO2 100%      Physical Exam Constitutional:      General: He is not in acute distress.    Appearance: He is well-developed.  HENT:     Head: Normocephalic and atraumatic.  Eyes:     Conjunctiva/sclera: Conjunctivae normal.     Pupils: Pupils are equal, round, and reactive to light.  Neck:     Musculoskeletal: Normal range of motion.  Cardiovascular:     Rate and Rhythm: Normal rate.  Pulmonary:     Effort: Pulmonary effort is normal. No respiratory distress.  Abdominal:     General: There is no distension.     Palpations: Abdomen is soft.  Musculoskeletal: Normal range of motion.        General: Swelling, tenderness and signs of injury present.      Comments: Patient cradles right arm close to body.  No tenderness in the shoulder or upper humerus.  Tenderness all about the elbow with resistance to any movement.  Forearm is nontender.  Patient has mild tenderness over the distal radius.  Mild tenderness over the fifth met acarpal.  Skin:    General: Skin is warm and dry.     Comments: Small abrasion 1 x 2 cm, partial-thickness, fifth metacarpal knuckle  Neurological:     Mental Status: He is alert.      UC Treatments / Results  Labs (all labs ordered are listed, but only abnormal results are displayed) Labs Reviewed - No data to display  EKG None  Radiology Dg Elbow Complete Right  Result Date: 10/23/2018 CLINICAL DATA:  Acute RIGHT elbow pain following fall. Initial encounter. EXAM: RIGHT ELBOW - COMPLETE 3+ VIEW COMPARISON:  None. FINDINGS: A joint effusion is noted. Marked irregularity of the distal humerus is noted which appears chronic. Irregularity of the proximal RIGHT humerus on remote chest radiographs noted. Degenerative changes at the humeral ulnar joint noted. No definite acute fracture noted.  No dislocation. IMPRESSION: Joint effusion without definite acute bony abnormality. Marked irregularity of the distal humerus which limits evaluation of fracture. Consider short-term radiographic follow-up or CT/MR if there is strong clinical suspicion for acute bony injury. Recommend dedicated imaging of the entire RIGHT humerus given distal humeral abnormality, which does appear chronic however. Electronically Signed   By: Margarette Canada M.D.   On: 10/23/2018 13:29   Dg Hand Complete Right  Result Date: 10/23/2018  CLINICAL DATA:  Acute RIGHT hand pain following fall yesterday. Initial encounter. EXAM: RIGHT HAND - COMPLETE 3+ VIEW COMPARISON:  None. FINDINGS: No acute fracture, subluxation or dislocation. Mild degenerative changes at the radiocarpal joint and 1st carpometacarpal joint noted. Chondrocalcinosis at the triangular  fibrocartilage noted. IMPRESSION: No acute bony abnormality. Degenerative changes and wrist chondrocalcinosis. Electronically Signed   By: Margarette Canada M.D.   On: 10/23/2018 13:24    Procedures Procedures (including critical care time)  Medications Ordered in UC Medications  HYDROcodone-acetaminophen (NORCO/VICODIN) 5-325 MG per tablet 1 tablet (1 tablet Oral Given 10/23/18 1231)    Initial Impression / Assessment and Plan / UC Course  I have reviewed the triage vital signs and the nursing notes.  Pertinent labs & imaging results that were available during my care of the patient were reviewed by me and considered in my medical decision making (see chart for details).     I discussed this radiograph with the patient and his wife.  As I was explaining the bony abnormality in his humerus he stated "you know I have Paget's disease" it was diagnosed many years ago when he lived in [place] but is not in his current medical record. I called Dr. Meridee Score.  He is on call for Dr. Lorin Mercy, Mr. Douds's usual orthopedic surgeon.  He agrees with ice immobilization and follow-up with Dr. Lorin Mercy next week Final Clinical Impressions(s) / UC Diagnoses   Final diagnoses:  Closed fracture of right elbow, initial encounter  Fall, initial encounter     Discharge Instructions     Keep right arm in splint until you see Dr. Lorin Mercy in follow-up Put ice on arm for 20 minutes every 2-4 hours to reduce pain and swelling Take pain medication as needed The pain medicine, hydrocodone, can cause drowsiness.  Do not drive, be careful with balance and falls Pain medicine also can cause constipation.  Make sure you get enough fiber in your diet and drink plenty of water Call Dr. Lorin Mercy office in the morning for an appointment next week    ED Prescriptions    Medication Sig Dispense Auth. Provider   HYDROcodone-acetaminophen (NORCO/VICODIN) 5-325 MG tablet Take 1-2 tablets by mouth every 6 (six) hours as needed. 15  tablet Raylene Everts, MD     Controlled Substance Prescriptions [place] Controlled Substance Registry consulted? Yes, I have consulted the [place] Controlled Substances Registry for this patient, and feel the risk/benefit ratio today is favorable for proceeding with this prescription for a controlled substance.   Raylene Everts, MD 10/23/18 1357

## 2018-10-23 NOTE — ED Triage Notes (Signed)
Pt states yesterday he stumbled on some steps and fell forward, scrapped the side of his R hand, states he hit his R elbow on the ground. Pt wearing home sling. Pain in R wrist as well. States he scraped his lip but denies hitting head.

## 2018-10-23 NOTE — Discharge Instructions (Signed)
Keep right arm in splint until you see Dr. Lorin Mercy in follow-up Put ice on arm for 20 minutes every 2-4 hours to reduce pain and swelling Take pain medication as needed The pain medicine, hydrocodone, can cause drowsiness.  Do not drive, be careful with balance and falls Pain medicine also can cause constipation.  Make sure you get enough fiber in your diet and drink plenty of water Call Dr. Lorin Mercy office in the morning for an appointment next week

## 2018-10-25 ENCOUNTER — Ambulatory Visit (HOSPITAL_COMMUNITY)
Admission: RE | Admit: 2018-10-25 | Discharge: 2018-10-25 | Disposition: A | Discharge status: Home / Self Care | Payer: Medicare Other | Source: Ambulatory Visit | Attending: Nephrology | Admitting: Nephrology

## 2018-10-25 VITALS — BP 123/74 | HR 63 | Temp 98.4°F | Resp 20

## 2018-10-25 DIAGNOSIS — Z79899 Other long term (current) drug therapy: Secondary | ICD-10-CM | POA: Diagnosis not present

## 2018-10-25 DIAGNOSIS — D631 Anemia in chronic kidney disease: Secondary | ICD-10-CM | POA: Diagnosis not present

## 2018-10-25 DIAGNOSIS — N183 Chronic kidney disease, stage 3 (moderate): Secondary | ICD-10-CM | POA: Insufficient documentation

## 2018-10-25 DIAGNOSIS — N289 Disorder of kidney and ureter, unspecified: Secondary | ICD-10-CM

## 2018-10-25 DIAGNOSIS — Z5181 Encounter for therapeutic drug level monitoring: Secondary | ICD-10-CM | POA: Diagnosis not present

## 2018-10-25 LAB — POCT HEMOGLOBIN-HEMACUE: Hemoglobin: 9.7 g/dL — ABNORMAL LOW (ref 13.0–17.0)

## 2018-10-25 LAB — IRON AND TIBC
Iron: 21 ug/dL — ABNORMAL LOW (ref 45–182)
Saturation Ratios: 8 % — ABNORMAL LOW (ref 17.9–39.5)
TIBC: 260 ug/dL (ref 250–450)
UIBC: 239 ug/dL

## 2018-10-25 LAB — FERRITIN: Ferritin: 1464 ng/mL — ABNORMAL HIGH (ref 24–336)

## 2018-10-25 MED ORDER — EPOETIN ALFA 20000 UNIT/ML IJ SOLN
INTRAMUSCULAR | Status: AC
  Administered 2018-10-25: 20000 [IU] via SUBCUTANEOUS
  Filled 2018-10-25: qty 1

## 2018-10-25 MED ORDER — EPOETIN ALFA 40000 UNIT/ML IJ SOLN: 30000.0000 [IU] | INTRAMUSCULAR | Status: DC

## 2018-10-25 MED ORDER — EPOETIN ALFA 10000 UNIT/ML IJ SOLN
INTRAMUSCULAR | Status: AC
  Administered 2018-10-25: 10000 [IU] via SUBCUTANEOUS
  Filled 2018-10-25: qty 1

## 2018-10-26 ENCOUNTER — Encounter (INDEPENDENT_AMBULATORY_CARE_PROVIDER_SITE_OTHER): Payer: Self-pay | Admitting: Surgery

## 2018-10-26 ENCOUNTER — Ambulatory Visit (INDEPENDENT_AMBULATORY_CARE_PROVIDER_SITE_OTHER): Payer: Self-pay

## 2018-10-26 ENCOUNTER — Ambulatory Visit (INDEPENDENT_AMBULATORY_CARE_PROVIDER_SITE_OTHER): Payer: Medicare Other | Admitting: Surgery

## 2018-10-26 VITALS — BP 128/80 | HR 89 | Ht 70.8 in | Wt 206.8 lb

## 2018-10-26 DIAGNOSIS — M25521 Pain in right elbow: Secondary | ICD-10-CM

## 2018-10-26 DIAGNOSIS — M25511 Pain in right shoulder: Secondary | ICD-10-CM | POA: Diagnosis not present

## 2018-10-26 MED ORDER — HYDROCODONE-ACETAMINOPHEN 5-325 MG PO TABS: 1.0000 | ORAL_TABLET | Freq: Two times a day (BID) | ORAL | 0 refills | Status: DC | PRN

## 2018-10-26 NOTE — Progress Notes (Signed)
Office Visit Note   Patient: Alec Solis           Date of Birth: 09-14-1932           MRN: [id number] Visit Date: 10/26/2018              Requested by: Alec Solis, [address] Alec Solis, [place] [address] PCP: Alec Chard, MD   Assessment & Plan: Visit Diagnoses:  1. Right elbow pain   2. Acute pain of right shoulder     Plan: Patient encouraged to take it easy over the next week.  Will follow-up in 1 week for recheck.  Dr. Lorin Solis did review his x-rays today.  Recommends that he is followed by oncology for his Paget's disease.  Follow-Up Instructions: Return in about 1 week (around 11/02/2018) for with Alec Solis.   Orders:  Orders Placed This Encounter  Procedures  . XR Elbow 2 Views Right  . XR Shoulder Right  . Ambulatory referral to Oncology   Meds ordered this encounter  Medications  . HYDROcodone-acetaminophen (NORCO/VICODIN) 5-325 MG tablet    Sig: Take 1 tablet by mouth 2 (two) times daily as needed for moderate pain.    Dispense:  30 tablet    Refill:  0      Procedures: No procedures performed   Clinical Data: No additional findings.   Subjective: Chief Complaint  Patient presents with  . Right Elbow - Fracture    HPI 85 year old white male comes in today with complaints of right shoulder and right elbow pain.  Patient fell coming out of a grocery store October 23, 2018 with a direct impact onto the right elbow.  Patient states that he has a history of Paget's disease diagnosed over years ago and this has affected the right elbow.  Patient was evaluated in the emergency room after the injury and had x-rays of the elbow and right hand.  No imaging studies of the right shoulder.  Right elbow report read IMPRESSION: Joint effusion without definite acute bony abnormality. Marked irregularity of the distal humerus which limits evaluation of fracture. Consider short-term radiographic follow-up or CT/MR if there is strong clinical suspicion  for acute bony injury.  Recommend dedicated imaging of the entire RIGHT humerus given distal humeral abnormality, which does appear chronic however.  Right hand read FINDINGS: No acute fracture, subluxation or dislocation.  Mild degenerative changes at the radiocarpal joint and 1st carpometacarpal joint noted.  Chondrocalcinosis at the triangular fibrocartilage noted.  IMPRESSION: No acute bony abnormality.   Patient states that currently he is having some pain in the right elbow but most of his pain is centered around the right shoulder.  He is unable to raise his right shoulder overhead which was not a problem before the fall.  States that his current elbow pain and range of motion are about the same as his usual from the Paget's disease.  He is always had limitation of his elbow and has not not had full flexion or extension in several years.  The problem has not affected the left elbow.  Review of systems No current cardiac pulmonary GI GU issues  Objective: Vital Signs: BP 128/80   Pulse 89   Ht 5' 10.8" (1.798 m)   Wt 206 lb 12.8 oz (93.8 kg)   BMI 29.01 kg/m   Physical Exam Musculoskeletal:     Comments: Right shoulder is diffusely tender.  He is unable to actively abduct or flex the shoulder.  Right  elbow is diffusely tender.  Range of motion about 45 to 85 degrees.     Ortho Exam  Specialty Comments:  No specialty comments available.  Imaging: No results found.   PMFS History: Patient Active Problem List   Diagnosis Date Noted  . Paget's disease of bone 10/23/2018  . Acute renal failure (ARF) ([place]) 06/11/2016  . Hyperglycemia 06/11/2016  . Deficiency anemia 12/09/2011  . Diabetes mellitus ([place]) 12/09/2011  . Renal insufficiency 12/09/2011   Past Medical History:  Diagnosis Date  . Anemia   . Anxiety   . Arthritis   . Cataract   . Chronic kidney disease   . Depression   . Diabetes mellitus without complication ([place])    type II   . GERD  (gastroesophageal reflux disease)   . Gout   . Heart murmur   . Hypertension   . Myocardial infarction ([place])    minor Mi- years ago -   . Varicose veins of left lower extremity     Family History  Problem Relation Age of Onset  . Breast cancer Mother   . Stomach cancer Mother   . Diabetes Mellitus II Maternal Aunt     Past Surgical History:  Procedure Laterality Date  . BUNIONECTOMY    . cataract    . COLONOSCOPY WITH PROPOFOL N/A 05/28/2017   Procedure: COLONOSCOPY WITH PROPOFOL;  Surgeon: Alec Ada, MD;  Location: [facility];  Service: Endoscopy;  Laterality: N/A;  . ESOPHAGOGASTRODUODENOSCOPY (EGD) WITH PROPOFOL N/A 05/28/2017   Procedure: ESOPHAGOGASTRODUODENOSCOPY (EGD) WITH PROPOFOL;  Surgeon: Alec Ada, MD;  [facility];  Service: Endoscopy;  Laterality: N/A;   Social History   Occupational History  . Not on file  Tobacco Use  . Smoking status: Former Smoker    Packs/day: 0.25    Years: 50.00    Pack years: 12.50    Types: Cigarettes  . Smokeless tobacco: Never Used  Substance and Sexual Activity  . Alcohol use: No  . Drug use: No  . Sexual activity: Not on file

## 2018-11-01 ENCOUNTER — Other Ambulatory Visit: Payer: Self-pay | Admitting: Internal Medicine

## 2018-11-08 ENCOUNTER — Other Ambulatory Visit: Payer: Self-pay

## 2018-11-08 ENCOUNTER — Ambulatory Visit (HOSPITAL_COMMUNITY)
Admission: RE | Admit: 2018-11-08 | Discharge: 2018-11-08 | Disposition: A | Discharge status: Home / Self Care | Payer: Medicare Other | Source: Ambulatory Visit | Attending: Nephrology | Admitting: Nephrology

## 2018-11-08 VITALS — BP 136/79 | HR 84 | Temp 98.5°F | Resp 20

## 2018-11-08 DIAGNOSIS — N289 Disorder of kidney and ureter, unspecified: Secondary | ICD-10-CM | POA: Insufficient documentation

## 2018-11-08 MED ORDER — EPOETIN ALFA 20000 UNIT/ML IJ SOLN
INTRAMUSCULAR | Status: AC
  Administered 2018-11-08: 20000 [IU] via SUBCUTANEOUS
  Filled 2018-11-08: qty 1

## 2018-11-08 MED ORDER — EPOETIN ALFA 10000 UNIT/ML IJ SOLN
INTRAMUSCULAR | Status: AC
  Administered 2018-11-08: 10000 [IU] via SUBCUTANEOUS
  Filled 2018-11-08: qty 1

## 2018-11-08 MED ORDER — EPOETIN ALFA 40000 UNIT/ML IJ SOLN: 30000.0000 [IU] | INTRAMUSCULAR | Status: DC

## 2018-11-09 LAB — POCT HEMOGLOBIN-HEMACUE: Hemoglobin: 11.1 g/dL — ABNORMAL LOW (ref 13.0–17.0)

## 2018-11-22 ENCOUNTER — Other Ambulatory Visit: Payer: Self-pay

## 2018-11-22 ENCOUNTER — Ambulatory Visit (HOSPITAL_COMMUNITY)
Admission: RE | Admit: 2018-11-22 | Discharge: 2018-11-22 | Disposition: A | Discharge status: Home / Self Care | Payer: Medicare Other | Source: Ambulatory Visit | Attending: Nephrology | Admitting: Nephrology

## 2018-11-22 VITALS — BP 146/71 | HR 42 | Temp 97.6°F

## 2018-11-22 DIAGNOSIS — N183 Chronic kidney disease, stage 3 (moderate): Secondary | ICD-10-CM | POA: Diagnosis not present

## 2018-11-22 DIAGNOSIS — N289 Disorder of kidney and ureter, unspecified: Secondary | ICD-10-CM

## 2018-11-22 DIAGNOSIS — D631 Anemia in chronic kidney disease: Secondary | ICD-10-CM | POA: Diagnosis not present

## 2018-11-22 LAB — IRON AND TIBC
Iron: 55 ug/dL (ref 45–182)
Saturation Ratios: 19 % (ref 17.9–39.5)
TIBC: 295 ug/dL (ref 250–450)
UIBC: 240 ug/dL

## 2018-11-22 LAB — POCT HEMOGLOBIN-HEMACUE: Hemoglobin: 10.3 g/dL — ABNORMAL LOW (ref 13.0–17.0)

## 2018-11-22 LAB — FERRITIN: Ferritin: 1291 ng/mL — ABNORMAL HIGH (ref 24–336)

## 2018-11-22 MED ORDER — EPOETIN ALFA 40000 UNIT/ML IJ SOLN: 30000.0000 [IU] | INTRAMUSCULAR | Status: DC

## 2018-11-22 MED ORDER — EPOETIN ALFA 20000 UNIT/ML IJ SOLN
INTRAMUSCULAR | Status: AC
  Administered 2018-11-22: 20000 [IU] via SUBCUTANEOUS
  Filled 2018-11-22: qty 1

## 2018-11-22 MED ORDER — EPOETIN ALFA 10000 UNIT/ML IJ SOLN
INTRAMUSCULAR | Status: AC
  Administered 2018-11-22: 10000 [IU] via SUBCUTANEOUS
  Filled 2018-11-22: qty 1

## 2018-12-05 ENCOUNTER — Other Ambulatory Visit: Payer: Self-pay

## 2018-12-05 ENCOUNTER — Other Ambulatory Visit (HOSPITAL_COMMUNITY): Payer: Self-pay | Admitting: *Deleted

## 2018-12-06 ENCOUNTER — Ambulatory Visit (HOSPITAL_COMMUNITY)
Admission: RE | Admit: 2018-12-06 | Discharge: 2018-12-06 | Disposition: A | Discharge status: Home / Self Care | Payer: Medicare Other | Source: Ambulatory Visit | Attending: Nephrology | Admitting: Nephrology

## 2018-12-06 VITALS — BP 132/64 | HR 71 | Temp 98.0°F | Resp 20

## 2018-12-06 DIAGNOSIS — N289 Disorder of kidney and ureter, unspecified: Secondary | ICD-10-CM | POA: Diagnosis not present

## 2018-12-06 LAB — POCT HEMOGLOBIN-HEMACUE: Hemoglobin: 10 g/dL — ABNORMAL LOW (ref 13.0–17.0)

## 2018-12-06 MED ORDER — EPOETIN ALFA 40000 UNIT/ML IJ SOLN: 30000.0000 [IU] | INTRAMUSCULAR | Status: DC

## 2018-12-06 MED ORDER — EPOETIN ALFA 20000 UNIT/ML IJ SOLN
INTRAMUSCULAR | Status: AC
  Administered 2018-12-06: 20000 [IU] via SUBCUTANEOUS
  Filled 2018-12-06: qty 1

## 2018-12-06 MED ORDER — EPOETIN ALFA 10000 UNIT/ML IJ SOLN
INTRAMUSCULAR | Status: AC
  Administered 2018-12-06: 10000 [IU] via SUBCUTANEOUS
  Filled 2018-12-06: qty 1

## 2018-12-06 MED ORDER — SODIUM CHLORIDE 0.9 % IV SOLN
510.0000 mg | Freq: Once | INTRAVENOUS | Status: AC
  Administered 2018-12-06: 510 mg via INTRAVENOUS
  Filled 2018-12-06: qty 17

## 2018-12-20 ENCOUNTER — Ambulatory Visit (HOSPITAL_COMMUNITY)
Admission: RE | Admit: 2018-12-20 | Discharge: 2018-12-20 | Disposition: A | Discharge status: Home / Self Care | Payer: Medicare Other | Source: Ambulatory Visit | Attending: Nephrology | Admitting: Nephrology

## 2018-12-20 ENCOUNTER — Other Ambulatory Visit: Payer: Self-pay

## 2018-12-20 VITALS — BP 136/88 | HR 85 | Temp 98.0°F | Resp 20

## 2018-12-20 DIAGNOSIS — D631 Anemia in chronic kidney disease: Secondary | ICD-10-CM | POA: Insufficient documentation

## 2018-12-20 DIAGNOSIS — N189 Chronic kidney disease, unspecified: Secondary | ICD-10-CM | POA: Diagnosis not present

## 2018-12-20 DIAGNOSIS — N289 Disorder of kidney and ureter, unspecified: Secondary | ICD-10-CM

## 2018-12-20 LAB — IRON AND TIBC
Iron: 78 ug/dL (ref 45–182)
Saturation Ratios: 25 % (ref 17.9–39.5)
TIBC: 308 ug/dL (ref 250–450)
UIBC: 230 ug/dL

## 2018-12-20 LAB — FERRITIN: Ferritin: 1446 ng/mL — ABNORMAL HIGH (ref 24–336)

## 2018-12-20 MED ORDER — EPOETIN ALFA 40000 UNIT/ML IJ SOLN: 30000.0000 [IU] | INTRAMUSCULAR | Status: DC

## 2018-12-20 MED ORDER — EPOETIN ALFA 10000 UNIT/ML IJ SOLN
INTRAMUSCULAR | Status: AC
  Administered 2018-12-20: 10000 [IU] via SUBCUTANEOUS
  Filled 2018-12-20: qty 1

## 2018-12-20 MED ORDER — EPOETIN ALFA 20000 UNIT/ML IJ SOLN
INTRAMUSCULAR | Status: AC
  Administered 2018-12-20: 20000 [IU] via SUBCUTANEOUS
  Filled 2018-12-20: qty 1

## 2018-12-21 LAB — POCT HEMOGLOBIN-HEMACUE: Hemoglobin: 9.7 g/dL — ABNORMAL LOW (ref 13.0–17.0)

## 2019-01-02 ENCOUNTER — Other Ambulatory Visit: Payer: Self-pay

## 2019-01-02 ENCOUNTER — Other Ambulatory Visit (HOSPITAL_COMMUNITY): Payer: Self-pay | Admitting: *Deleted

## 2019-01-03 ENCOUNTER — Ambulatory Visit (HOSPITAL_COMMUNITY)
Admission: RE | Admit: 2019-01-03 | Discharge: 2019-01-03 | Disposition: A | Discharge status: Home / Self Care | Payer: Medicare Other | Source: Ambulatory Visit | Attending: Nephrology | Admitting: Nephrology

## 2019-01-03 VITALS — BP 152/79 | HR 41 | Temp 98.6°F | Resp 20

## 2019-01-03 DIAGNOSIS — N289 Disorder of kidney and ureter, unspecified: Secondary | ICD-10-CM | POA: Insufficient documentation

## 2019-01-03 LAB — POCT HEMOGLOBIN-HEMACUE: Hemoglobin: 10 g/dL — ABNORMAL LOW (ref 13.0–17.0)

## 2019-01-03 MED ORDER — EPOETIN ALFA 20000 UNIT/ML IJ SOLN
INTRAMUSCULAR | Status: AC
  Administered 2019-01-03: 09:00:00 20000 [IU] via SUBCUTANEOUS
  Filled 2019-01-03: qty 1

## 2019-01-03 MED ORDER — EPOETIN ALFA 40000 UNIT/ML IJ SOLN: 30000.0000 [IU] | INTRAMUSCULAR | Status: DC

## 2019-01-03 MED ORDER — EPOETIN ALFA 10000 UNIT/ML IJ SOLN
INTRAMUSCULAR | Status: AC
  Administered 2019-01-03: 10000 [IU] via SUBCUTANEOUS
  Filled 2019-01-03: qty 1

## 2019-01-03 MED ORDER — SODIUM CHLORIDE 0.9 % IV SOLN
510.0000 mg | Freq: Once | INTRAVENOUS | Status: AC
  Administered 2019-01-03: 510 mg via INTRAVENOUS
  Filled 2019-01-03: qty 510

## 2019-01-07 ENCOUNTER — Other Ambulatory Visit: Payer: Self-pay | Admitting: Internal Medicine

## 2019-01-10 ENCOUNTER — Other Ambulatory Visit: Payer: Self-pay | Admitting: Internal Medicine

## 2019-01-11 ENCOUNTER — Other Ambulatory Visit: Payer: Self-pay | Admitting: Internal Medicine

## 2019-01-17 ENCOUNTER — Encounter: Payer: Self-pay | Admitting: Internal Medicine

## 2019-01-17 ENCOUNTER — Other Ambulatory Visit: Payer: Self-pay

## 2019-01-17 ENCOUNTER — Ambulatory Visit (INDEPENDENT_AMBULATORY_CARE_PROVIDER_SITE_OTHER): Payer: Medicare Other | Admitting: Internal Medicine

## 2019-01-17 ENCOUNTER — Ambulatory Visit (HOSPITAL_COMMUNITY)
Admission: RE | Admit: 2019-01-17 | Discharge: 2019-01-17 | Disposition: A | Discharge status: Home / Self Care | Payer: Medicare Other | Source: Ambulatory Visit | Attending: Nephrology | Admitting: Nephrology

## 2019-01-17 VITALS — BP 147/64 | HR 45 | Temp 96.5°F | Resp 20

## 2019-01-17 VITALS — BP 142/86 | HR 40 | Temp 98.5°F | Ht 70.8 in | Wt 205.8 lb

## 2019-01-17 DIAGNOSIS — Z6828 Body mass index (BMI) 28.0-28.9, adult: Secondary | ICD-10-CM | POA: Diagnosis not present

## 2019-01-17 DIAGNOSIS — I129 Hypertensive chronic kidney disease with stage 1 through stage 4 chronic kidney disease, or unspecified chronic kidney disease: Secondary | ICD-10-CM | POA: Diagnosis not present

## 2019-01-17 DIAGNOSIS — E1122 Type 2 diabetes mellitus with diabetic chronic kidney disease: Secondary | ICD-10-CM | POA: Diagnosis not present

## 2019-01-17 DIAGNOSIS — D631 Anemia in chronic kidney disease: Secondary | ICD-10-CM | POA: Diagnosis not present

## 2019-01-17 DIAGNOSIS — N183 Chronic kidney disease, stage 3 unspecified: Secondary | ICD-10-CM

## 2019-01-17 DIAGNOSIS — N2581 Secondary hyperparathyroidism of renal origin: Secondary | ICD-10-CM | POA: Diagnosis not present

## 2019-01-17 DIAGNOSIS — N189 Chronic kidney disease, unspecified: Secondary | ICD-10-CM | POA: Diagnosis not present

## 2019-01-17 DIAGNOSIS — M889 Osteitis deformans of unspecified bone: Secondary | ICD-10-CM | POA: Diagnosis not present

## 2019-01-17 DIAGNOSIS — K279 Peptic ulcer, site unspecified, unspecified as acute or chronic, without hemorrhage or perforation: Secondary | ICD-10-CM | POA: Diagnosis not present

## 2019-01-17 DIAGNOSIS — E7800 Pure hypercholesterolemia, unspecified: Secondary | ICD-10-CM

## 2019-01-17 DIAGNOSIS — N289 Disorder of kidney and ureter, unspecified: Secondary | ICD-10-CM

## 2019-01-17 DIAGNOSIS — M109 Gout, unspecified: Secondary | ICD-10-CM | POA: Diagnosis not present

## 2019-01-17 DIAGNOSIS — N2589 Other disorders resulting from impaired renal tubular function: Secondary | ICD-10-CM | POA: Diagnosis not present

## 2019-01-17 DIAGNOSIS — E785 Hyperlipidemia, unspecified: Secondary | ICD-10-CM | POA: Diagnosis not present

## 2019-01-17 DIAGNOSIS — N400 Enlarged prostate without lower urinary tract symptoms: Secondary | ICD-10-CM | POA: Diagnosis not present

## 2019-01-17 LAB — POCT URINALYSIS DIPSTICK
Bilirubin, UA: NEGATIVE
Blood, UA: NEGATIVE
Glucose, UA: NEGATIVE
Ketones, UA: NEGATIVE
Leukocytes, UA: NEGATIVE
Nitrite, UA: NEGATIVE
Protein, UA: NEGATIVE
Spec Grav, UA: 1.015 (ref 1.010–1.025)
Urobilinogen, UA: 0.2 E.U./dL
pH, UA: 6.5 (ref 5.0–8.0)

## 2019-01-17 LAB — IRON AND TIBC
Iron: 66 ug/dL (ref 45–182)
Saturation Ratios: 23 % (ref 17.9–39.5)
TIBC: 291 ug/dL (ref 250–450)
UIBC: 225 ug/dL

## 2019-01-17 LAB — POCT UA - MICROALBUMIN
Albumin/Creatinine Ratio, Urine, POC: <30
Creatinine, POC: 300 mg/dL
Microalbumin Ur, POC: 30 mg/L

## 2019-01-17 LAB — FERRITIN: Ferritin: 1308 ng/mL — ABNORMAL HIGH (ref 24–336)

## 2019-01-17 LAB — POCT HEMOGLOBIN-HEMACUE: Hemoglobin: 10.9 g/dL — ABNORMAL LOW (ref 13.0–17.0)

## 2019-01-17 MED ORDER — EPOETIN ALFA 20000 UNIT/ML IJ SOLN
INTRAMUSCULAR | Status: AC
  Administered 2019-01-17: 20000 [IU] via SUBCUTANEOUS
  Filled 2019-01-17: qty 1

## 2019-01-17 MED ORDER — EPOETIN ALFA 40000 UNIT/ML IJ SOLN: 30000.0000 [IU] | INTRAMUSCULAR | Status: DC

## 2019-01-17 MED ORDER — ALLOPURINOL 100 MG PO TABS: 100.0000 mg | ORAL_TABLET | Freq: Two times a day (BID) | ORAL | 1 refills | Status: DC

## 2019-01-17 MED ORDER — INSULIN PEN NEEDLE 32G X 8 MM MISC: 2 refills | Status: DC

## 2019-01-17 MED ORDER — EPOETIN ALFA 10000 UNIT/ML IJ SOLN
INTRAMUSCULAR | Status: AC
  Administered 2019-01-17: 10000 [IU] via SUBCUTANEOUS
  Filled 2019-01-17: qty 1

## 2019-01-17 NOTE — Patient Instructions (Signed)
Diabetes Mellitus and Nutrition, Adult  When you have diabetes (diabetes mellitus), it is very important to have healthy eating habits because your blood sugar (glucose) levels are greatly affected by what you eat and drink. Eating healthy foods in the appropriate amounts, at about the same times every day, can help you:  · Control your blood glucose.  · Lower your risk of heart disease.  · Improve your blood pressure.  · Reach or maintain a healthy weight.  Every person with diabetes is different, and each person has different needs for a meal plan. Your health care provider may recommend that you work with a diet and nutrition specialist (dietitian) to make a meal plan that is best for you. Your meal plan may vary depending on factors such as:  · The calories you need.  · The medicines you take.  · Your weight.  · Your blood glucose, blood pressure, and cholesterol levels.  · Your activity level.  · Other health conditions you have, such as heart or kidney disease.  How do carbohydrates affect me?  Carbohydrates, also called carbs, affect your blood glucose level more than any other type of food. Eating carbs naturally raises the amount of glucose in your blood. Carb counting is a method for keeping track of how many carbs you eat. Counting carbs is important to keep your blood glucose at a healthy level, especially if you use insulin or take certain oral diabetes medicines.  It is important to know how many carbs you can safely have in each meal. This is different for every person. Your dietitian can help you calculate how many carbs you should have at each meal and for each snack.  Foods that contain carbs include:  · Bread, cereal, rice, pasta, and crackers.  · Potatoes and corn.  · Peas, beans, and lentils.  · Milk and yogurt.  · Fruit and juice.  · Desserts, such as cakes, cookies, ice cream, and candy.  How does alcohol affect me?  Alcohol can cause a sudden decrease in blood glucose (hypoglycemia),  especially if you use insulin or take certain oral diabetes medicines. Hypoglycemia can be a life-threatening condition. Symptoms of hypoglycemia (sleepiness, dizziness, and confusion) are similar to symptoms of having too much alcohol.  If your health care provider says that alcohol is safe for you, follow these guidelines:  · Limit alcohol intake to no more than 1 drink per day for nonpregnant women and 2 drinks per day for men. One drink equals 12 oz of beer, 5 oz of wine, or 1½ oz of hard liquor.  · Do not drink on an empty stomach.  · Keep yourself hydrated with water, diet soda, or unsweetened iced tea.  · Keep in mind that regular soda, juice, and other mixers may contain a lot of sugar and must be counted as carbs.  What are tips for following this plan?    Reading food labels  · Start by checking the serving size on the "Nutrition Facts" label of packaged foods and drinks. The amount of calories, carbs, fats, and other nutrients listed on the label is based on one serving of the item. Many items contain more than one serving per package.  · Check the total grams (g) of carbs in one serving. You can calculate the number of servings of carbs in one serving by dividing the total carbs by 15. For example, if a food has 30 g of total carbs, it would be equal to 2   servings of carbs.  · Check the number of grams (g) of saturated and trans fats in one serving. Choose foods that have low or no amount of these fats.  · Check the number of milligrams (mg) of salt (sodium) in one serving. Most people should limit total sodium intake to less than 2,300 mg per day.  · Always check the nutrition information of foods labeled as "low-fat" or "nonfat". These foods may be higher in added sugar or refined carbs and should be avoided.  · Talk to your dietitian to identify your daily goals for nutrients listed on the label.  Shopping  · Avoid buying canned, premade, or processed foods. These foods tend to be high in fat, sodium,  and added sugar.  · Shop around the outside edge of the grocery store. This includes fresh fruits and vegetables, bulk grains, fresh meats, and fresh dairy.  Cooking  · Use low-heat cooking methods, such as baking, instead of high-heat cooking methods like deep frying.  · Cook using healthy oils, such as olive, canola, or sunflower oil.  · Avoid cooking with butter, cream, or high-fat meats.  Meal planning  · Eat meals and snacks regularly, preferably at the same times every day. Avoid going long periods of time without eating.  · Eat foods high in fiber, such as fresh fruits, vegetables, beans, and whole grains. Talk to your dietitian about how many servings of carbs you can eat at each meal.  · Eat 4-6 ounces (oz) of lean protein each day, such as lean meat, chicken, fish, eggs, or tofu. One oz of lean protein is equal to:  ? 1 oz of meat, chicken, or fish.  ? 1 egg.  ? ¼ cup of tofu.  · Eat some foods each day that contain healthy fats, such as avocado, nuts, seeds, and fish.  Lifestyle  · Check your blood glucose regularly.  · Exercise regularly as told by your health care provider. This may include:  ? 150 minutes of moderate-intensity or vigorous-intensity exercise each week. This could be brisk walking, biking, or water aerobics.  ? Stretching and doing strength exercises, such as yoga or weightlifting, at least 2 times a week.  · Take medicines as told by your health care provider.  · Do not use any products that contain nicotine or tobacco, such as cigarettes and e-cigarettes. If you need help quitting, ask your health care provider.  · Work with a counselor or diabetes educator to identify strategies to manage stress and any emotional and social challenges.  Questions to ask a health care provider  · Do I need to meet with a diabetes educator?  · Do I need to meet with a dietitian?  · What number can I call if I have questions?  · When are the best times to check my blood glucose?  Where to find more  information:  · American Diabetes Association: diabetes.org  · Academy of Nutrition and Dietetics: www.eatright.org  · National Institute of Diabetes and Digestive and Kidney Diseases (NIH): www.niddk.nih.gov  Summary  · A healthy meal plan will help you control your blood glucose and maintain a healthy lifestyle.  · Working with a diet and nutrition specialist (dietitian) can help you make a meal plan that is best for you.  · Keep in mind that carbohydrates (carbs) and alcohol have immediate effects on your blood glucose levels. It is important to count carbs and to use alcohol carefully.  This information is not intended to   replace advice given to you by your health care provider. Make sure you discuss any questions you have with your health care provider.  Document Released: 05/07/2005 Document Revised: 03/10/2017 Document Reviewed: 09/14/2016  Elsevier Interactive Patient Education © 2019 [employer]

## 2019-01-18 ENCOUNTER — Encounter: Payer: Self-pay | Admitting: Podiatry

## 2019-01-18 ENCOUNTER — Ambulatory Visit (INDEPENDENT_AMBULATORY_CARE_PROVIDER_SITE_OTHER): Payer: Medicare Other | Admitting: Podiatry

## 2019-01-18 ENCOUNTER — Encounter: Payer: Self-pay | Admitting: Internal Medicine

## 2019-01-18 VITALS — BP 147/78 | Temp 97.9°F

## 2019-01-18 DIAGNOSIS — B353 Tinea pedis: Secondary | ICD-10-CM

## 2019-01-18 DIAGNOSIS — B351 Tinea unguium: Secondary | ICD-10-CM | POA: Diagnosis not present

## 2019-01-18 DIAGNOSIS — L84 Corns and callosities: Secondary | ICD-10-CM

## 2019-01-18 DIAGNOSIS — E119 Type 2 diabetes mellitus without complications: Secondary | ICD-10-CM | POA: Diagnosis not present

## 2019-01-18 DIAGNOSIS — Z794 Long term (current) use of insulin: Secondary | ICD-10-CM | POA: Diagnosis not present

## 2019-01-18 DIAGNOSIS — M79675 Pain in left toe(s): Secondary | ICD-10-CM

## 2019-01-18 DIAGNOSIS — M79674 Pain in right toe(s): Secondary | ICD-10-CM | POA: Diagnosis not present

## 2019-01-18 LAB — CMP14+EGFR
ALT: 12 IU/L (ref 0–44)
AST: 17 IU/L (ref 0–40)
Albumin/Globulin Ratio: 1.6 (ref 1.2–2.2)
Albumin: 4.4 g/dL (ref 3.6–4.6)
Alkaline Phosphatase: 151 IU/L — ABNORMAL HIGH (ref 39–117)
BUN/Creatinine Ratio: 16 (ref 10–24)
BUN: 31 mg/dL — ABNORMAL HIGH (ref 8–27)
Bilirubin Total: 0.3 mg/dL (ref 0.0–1.2)
CO2: 18 mmol/L — ABNORMAL LOW (ref 20–29)
Calcium: 9.7 mg/dL (ref 8.6–10.2)
Chloride: 103 mmol/L (ref 96–106)
Creatinine, Ser: 1.96 mg/dL — ABNORMAL HIGH (ref 0.76–1.27)
GFR calc Af Amer: 36 mL/min/{1.73_m2} — ABNORMAL LOW (ref >59)
GFR calc non Af Amer: 31 mL/min/{1.73_m2} — ABNORMAL LOW (ref >59)
Globulin, Total: 2.8 g/dL (ref 1.5–4.5)
Glucose: 171 mg/dL — ABNORMAL HIGH (ref 65–99)
Potassium: 4.7 mmol/L (ref 3.5–5.2)
Sodium: 139 mmol/L (ref 134–144)
Total Protein: 7.2 g/dL (ref 6.0–8.5)

## 2019-01-18 LAB — PROTEIN ELECTROPHORESIS, SERUM
A/G Ratio: 1 (ref 0.7–1.7)
Albumin ELP: 3.6 g/dL (ref 2.9–4.4)
Alpha 1: 0.2 g/dL (ref 0.0–0.4)
Alpha 2: 0.9 g/dL (ref 0.4–1.0)
Beta: 1 g/dL (ref 0.7–1.3)
Gamma Globulin: 1.5 g/dL (ref 0.4–1.8)
Globulin, Total: 3.6 g/dL (ref 2.2–3.9)

## 2019-01-18 LAB — HEMOGLOBIN A1C
Est. average glucose Bld gHb Est-mCnc: 171 mg/dL
Hgb A1c MFr Bld: 7.6 % — ABNORMAL HIGH (ref 4.8–5.6)

## 2019-01-18 LAB — LIPID PANEL
Chol/HDL Ratio: 7.4 ratio — ABNORMAL HIGH (ref 0.0–5.0)
Cholesterol, Total: 185 mg/dL (ref 100–199)
HDL: 25 mg/dL — ABNORMAL LOW (ref >39)
Triglycerides: 535 mg/dL — ABNORMAL HIGH (ref 0–149)

## 2019-01-18 LAB — PHOSPHORUS: Phosphorus: 2.9 mg/dL (ref 2.8–4.1)

## 2019-01-18 MED ORDER — CICLOPIROX OLAMINE 0.77 % EX CREA: TOPICAL_CREAM | CUTANEOUS | 1 refills | Status: DC

## 2019-01-18 NOTE — Patient Instructions (Addendum)
Diabetes Mellitus and Foot Care Foot care is an important part of your health, especially when you have diabetes. Diabetes may cause you to have problems because of poor blood flow (circulation) to your feet and legs, which can cause your skin to:  Become thinner and drier.  Break more easily.  Heal more slowly.  Peel and crack. You may also have nerve damage (neuropathy) in your legs and feet, causing decreased feeling in them. This means that you may not notice minor injuries to your feet that could lead to more serious problems. Noticing and addressing any potential problems early is the best way to prevent future foot problems. How to care for your feet Foot hygiene  Wash your feet daily with warm water and mild soap. Do not use hot water. Then, pat your feet and the areas between your toes until they are completely dry. Do not soak your feet as this can dry your skin.  Trim your toenails straight across. Do not dig under them or around the cuticle. File the edges of your nails with an emery board or nail file.  Apply a moisturizing lotion or petroleum jelly to the skin on your feet and to dry, brittle toenails. Use lotion that does not contain alcohol and is unscented. Do not apply lotion between your toes. Shoes and socks  Wear clean socks or stockings every day. Make sure they are not too tight. Do not wear knee-high stockings since they may decrease blood flow to your legs.  Wear shoes that fit properly and have enough cushioning. Always look in your shoes before you put them on to be sure there are no objects inside.  To break in new shoes, wear them for just a few hours a day. This prevents injuries on your feet. Wounds, scrapes, corns, and calluses  Check your feet daily for blisters, cuts, bruises, sores, and redness. If you cannot see the bottom of your feet, use a mirror or ask someone for help.  Do not cut corns or calluses or try to remove them with medicine.  If you  find a minor scrape, cut, or break in the skin on your feet, keep it and the skin around it clean and dry. You may clean these areas with mild soap and water. Do not clean the area with peroxide, alcohol, or iodine.  If you have a wound, scrape, corn, or callus on your foot, look at it several times a day to make sure it is healing and not infected. Check for: ? Redness, swelling, or pain. ? Fluid or blood. ? Warmth. ? Pus or a bad smell. General instructions  Do not cross your legs. This may decrease blood flow to your feet.  Do not use heating pads or hot water bottles on your feet. They may burn your skin. If you have lost feeling in your feet or legs, you may not know this is happening until it is too late.  Protect your feet from hot and cold by wearing shoes, such as at the beach or on hot pavement.  Schedule a complete foot exam at least once a year (annually) or more often if you have foot problems. If you have foot problems, report any cuts, sores, or bruises to your health care provider immediately. Contact a health care provider if:  You have a medical condition that increases your risk of infection and you have any cuts, sores, or bruises on your feet.  You have an injury that is not   healing.  You have redness on your legs or feet.  You feel burning or tingling in your legs or feet.  You have pain or cramps in your legs and feet.  Your legs or feet are numb.  Your feet always feel cold.  You have pain around a toenail. Get help right away if:  You have a wound, scrape, corn, or callus on your foot and: ? You have pain, swelling, or redness that gets worse. ? You have fluid or blood coming from the wound, scrape, corn, or callus. ? Your wound, scrape, corn, or callus feels warm to the touch. ? You have pus or a bad smell coming from the wound, scrape, corn, or callus. ? You have a fever. ? You have a red line going up your leg. Summary  Check your feet every day  for cuts, sores, red spots, swelling, and blisters.  Moisturize feet and legs daily.  Wear shoes that fit properly and have enough cushioning.  If you have foot problems, report any cuts, sores, or bruises to your health care provider immediately.  Schedule a complete foot exam at least once a year (annually) or more often if you have foot problems. This information is not intended to replace advice given to you by your health care provider. Make sure you discuss any questions you have with your health care provider. Document Released: 08/07/2000 Document Revised: 09/22/2017 Document Reviewed: 09/11/2016 Elsevier Interactive Patient Education  2019 [place]  Athlete's foot (tinea pedis) is a fungal infection of the skin on your feet. It often occurs on the skin that is between or underneath the toes. It can also occur on the soles of your feet. The infection can spread from person to person (is contagious). It can also spread when a person's bare feet come in contact with the fungus on shower floors or on items such as shoes. What are the causes? This condition is caused by a fungus that grows in warm, moist places. You can get athlete's foot by sharing shoes, shower stalls, towels, and wet floors with someone who is infected. Not washing your feet or changing your socks often enough can also lead to athlete's foot. What increases the risk? This condition is more likely to develop in:  Men.  People who have a weak body defense system (immune system).  People who have diabetes.  People who use public showers, such as at a gym.  People who wear heavy-duty shoes, such as Environmental manager.  Seasons with warm, humid weather. What are the signs or symptoms? Symptoms of this condition include:  Itchy areas between your toes or on the soles of your feet.  White, flaky, or scaly areas between your toes or on the soles of your feet.  Very itchy small  blisters between your toes or on the soles of your feet.  Small cuts in your skin. These cuts can become infected.  Thick or discolored toenails. How is this diagnosed? This condition may be diagnosed with a physical exam and a review of your medical history. Your health care provider may also take a skin or toenail sample to examine under a microscope. How is this treated? This condition is treated with antifungal medicines. These may be applied as powders, ointments, or creams. In severe cases, an oral antifungal medicine may be given. Follow these instructions at home: Medicines  Apply or take over-the-counter and prescription medicines only as told by your health care  provider.  Apply your antifungal medicine as told by your health care provider. Do not stop using the antifungal even if your condition improves. Foot care  Do not scratch your feet.  Keep your feet dry: ? Wear cotton or wool socks. Change your socks every day or if they become wet. ? Wear shoes that allow air to flow, such as sandals or canvas tennis shoes.  Wash and dry your feet, including the area between your toes. Also, wash and dry your feet: ? Every day or as told by your health care provider. ? After exercising. General instructions  Do not let others use towels, shoes, nail clippers, or other personal items that touch your feet.  Protect your feet by wearing sandals in wet areas, such as locker rooms and shared showers.  Keep all follow-up visits as told by your health care provider. This is important.  If you have diabetes, keep your blood sugar under control. Contact a health care provider if:  You have a fever.  You have swelling, soreness, warmth, or redness in your foot.  Your feet are not getting better with treatment.  Your symptoms get worse.  You have new symptoms. Summary  Athlete's foot (tinea pedis) is a fungal infection of the skin on your feet. It often occurs on skin that is  between or underneath the toes.  This condition is caused by a fungus that grows in warm, moist places.  Symptoms include white, flaky, or scaly areas between your toes or on the soles of your feet.  This condition is treated with antifungal medicines.  Keep your feet clean. Always dry them thoroughly. This information is not intended to replace advice given to you by your health care provider. Make sure you discuss any questions you have with your health care provider. Document Released: 08/07/2000 Document Revised: 05/31/2017 Document Reviewed: 05/31/2017 Elsevier Interactive Patient Education  2019 [employer].

## 2019-01-18 NOTE — Progress Notes (Signed)
Subjective:     Patient ID: Alec Solis , male    DOB: 05/06/1933 , 85 y.o.   MRN: [id number]   Chief Complaint  Patient presents with  . Diabetes    fall  . Hypertension    HPI  Diabetes  He presents for his follow-up diabetic visit. He has type 2 diabetes mellitus. There are no hypoglycemic associated symptoms. There are no diabetic associated symptoms. Pertinent negatives for diabetes include no blurred vision and no chest pain. There are no hypoglycemic complications. Diabetic complications include nephropathy. Risk factors for coronary artery disease include diabetes mellitus, dyslipidemia, male sex, sedentary lifestyle and hypertension. Current diabetic treatment includes insulin injections. He is compliant with treatment some of the time. He is following a generally healthy diet. When asked about meal planning, he reported none. He participates in exercise intermittently. His breakfast blood glucose is taken between 9-10 am. His breakfast blood glucose range is generally 140-180 mg/dl.  Hypertension  This is a chronic problem. The current episode started more than 1 year ago. The problem has been gradually improving since onset. The problem is controlled. Pertinent negatives include no blurred vision, chest pain, palpitations or shortness of breath. Compliance problems include exercise and diet.      Past Medical History:  Diagnosis Date  . Anemia   . Anxiety   . Arthritis   . Cataract   . Chronic kidney disease   . Depression   . Diabetes mellitus without complication ([place])    type II   . GERD (gastroesophageal reflux disease)   . Gout   . Heart murmur   . Hypertension   . Myocardial infarction ([place])    minor Mi- years ago -   . Varicose veins of left lower extremity      Family History  Problem Relation Age of Onset  . Breast cancer Mother   . Stomach cancer Mother   . Diabetes Mellitus II Maternal Aunt      Current Outpatient Medications:  .  acetaminophen  (TYLENOL) 500 MG tablet, Take 2 tablets (1,000 mg total) by mouth every 8 (eight) hours as needed. (Patient taking differently: Take 500 mg by mouth every 8 (eight) hours as needed for moderate pain. ), Disp: 30 tablet, Rfl: 0 .  allopurinol (ZYLOPRIM) 100 MG tablet, Take 1 tablet (100 mg total) by mouth 2 (two) times daily., Disp: 180 tablet, Rfl: 1 .  amLODipine (NORVASC) 5 MG tablet, TAKE 1 TABLET BY MOUTH DAILY, Disp: 90 tablet, Rfl: 0 .  aspirin 81 MG tablet, Take 81 mg by mouth daily., Disp: , Rfl:  .  B-D ULTRAFINE III SHORT PEN 31G X 8 MM MISC, USE AS DIRECTED FOUR TIMES DAILY, Disp: 100 each, Rfl: 3 .  calcitRIOL (ROCALTROL) 0.25 MCG capsule, Take 0.25 mcg by mouth See admin instructions. Monday Thursday Sunday,  Pt takes every 3rd day, Disp: , Rfl:  .  carboxymethylcellulose (REFRESH PLUS) 0.5 % SOLN, 1 drop 3 (three) times daily as needed. As needed, Disp: , Rfl:  .  carvedilol (COREG) 25 MG tablet, Take 12.5 mg by mouth 2 (two) times daily with a meal. , Disp: , Rfl:  .  clopidogrel (PLAVIX) 75 MG tablet, TAKE 1 TABLET BY MOUTH DAILY, Disp: 90 tablet, Rfl: 0 .  Ferrous Gluconate (IRON 27 PO), Take by mouth. Patient takes as needed, Disp: , Rfl:  .  furosemide (LASIX) 40 MG tablet, Take 40 mg by mouth daily., Disp: , Rfl:  .  Ginger,  Zingiber officinalis, (GINGER ROOT) 550 MG CAPS, Take by mouth. Patient takes prn, Disp: , Rfl:  .  glucose blood (TRUE METRIX BLOOD GLUCOSE TEST) test strip, 1 each by Other route as needed for other. Use as directed to check blood sugars, Disp: 150 each, Rfl: 11 .  HYDROcodone-acetaminophen (NORCO/VICODIN) 5-325 MG tablet, Take 1 tablet by mouth 2 (two) times daily as needed for moderate pain., Disp: 30 tablet, Rfl: 0 .  Icosapent Ethyl (VASCEPA) 1 g CAPS, 2 bid, Disp: 360 capsule, Rfl: 1 .  insulin aspart (NOVOLOG FLEXPEN) 100 UNIT/ML FlexPen, Inject 6 Units into the skin 3 (three) times daily with meals. (Patient taking differently: Inject 2 Units into the  skin 3 (three) times daily with meals. ), Disp: 15 mL, Rfl: 0 .  Insulin Degludec (TRESIBA FLEXTOUCH) 200 UNIT/ML SOPN, Inject 22 Units into the skin daily., Disp: 9 pen, Rfl: 0 .  Insulin Pen Needle 32G X 8 MM MISC, Use as directed, Disp: 100 each, Rfl: 2 .  Omega-3 Fatty Acids (FISH OIL PO), Take by mouth., Disp: , Rfl:  .  omeprazole (PRILOSEC) 40 MG capsule, TAKE 1 CAPSULE BY MOUTH EVERY DAY BEFORE A MEAL, Disp: 90 capsule, Rfl: 1 .  sodium bicarbonate 650 MG tablet, Take 1,300 mg by mouth 2 (two) times daily., Disp: , Rfl:  .  tamsulosin (FLOMAX) 0.4 MG CAPS capsule, TAKE ONE CAPSULE BY MOUTH TWICE DAILY, Disp: 180 capsule, Rfl: 0 .  TRUEPLUS LANCETS 30G MISC, USE AS DIRECTED TO CHECK BLOOD SUGAR THREE TIMES DAILY, Disp: 300 each, Rfl: 11 .  ciclopirox (LOPROX) 0.77 % cream, Apply to both feet and between toes bid x 4 weeks, Disp: 30 g, Rfl: 1   No Known Allergies   Review of Systems  Constitutional: Negative.   Eyes: Negative for blurred vision.  Respiratory: Negative.  Negative for shortness of breath.   Cardiovascular: Negative.  Negative for chest pain and palpitations.  Gastrointestinal: Negative.   Neurological: Negative.   Psychiatric/Behavioral: Negative.      Today's Vitals   01/17/19 1152  BP: (!) 142/86  Pulse: (!) 40  Temp: 98.5 F (36.9 C)  TempSrc: Oral  Weight: 205 lb 12.8 oz (93.4 kg)  Height: 5' 10.8" (1.798 m)  PainSc: 0-No pain   Body mass index is 28.87 kg/m.   Objective:  Physical Exam Vitals signs and nursing note reviewed.  Constitutional:      Appearance: Normal appearance.  Cardiovascular:     Rate and Rhythm: Normal rate and regular rhythm.     Heart sounds: Normal heart sounds.  Pulmonary:     Effort: Pulmonary effort is normal.     Breath sounds: Normal breath sounds.  Skin:    General: Skin is warm.  Neurological:     General: No focal deficit present.     Mental Status: He is alert.  Psychiatric:        Mood and Affect: Mood  normal.         Assessment And Plan:     1. Diabetes mellitus with stage 3 chronic kidney disease ([place])  I will check labs as listed below. Additional labs drawn will be forwarded to his nephrologist for their review. He is encouraged to cut back on his intake of both lemonade and Kool-aid. He is encouraged to increase his water intake. Importance of dietary and medication compliance was discussed with the patient.   - CMP14+EGFR - Hemoglobin A1c - Lipid panel - Protein electrophoresis, serum -  Phosphorus - POCT UA - Microalbumin - POCT Urinalysis Dipstick (81002)  2. Hypertensive nephropathy  Fair control. He will continue with current meds for now. He is encouraged to avoid adding salt to his foods. Importance of regular exercise was discussed with the patient.   3. Pure hypercholesterolemia  I will check non-fasting lipid panel and LFTs. I do think he would benefit from Nutrition evaluation. I will discuss with him at his next visit.   4. Body mass index (BMI) of 28.0 to 28.9 in adult  His weight is stable.   Maximino Greenland, MD    THE PATIENT IS ENCOURAGED TO PRACTICE SOCIAL DISTANCING DUE TO THE COVID-19 PANDEMIC.

## 2019-01-21 ENCOUNTER — Other Ambulatory Visit: Payer: Self-pay | Admitting: Internal Medicine

## 2019-01-22 ENCOUNTER — Encounter: Payer: Self-pay | Admitting: Podiatry

## 2019-01-22 NOTE — Progress Notes (Signed)
Subjective: Alec Solis presents today referred by [name], MD for diabetic foot evaluation.  Patient states he has been diabetic for 20 years.  He denies any history of foot wounds.  He denies numbness, he denies tingling, and burning.  He does relate that he feels sharp pain in his feet occasionally.   Patient states he is corn between his left great toe and left second digit which is tender.  Patient states he had his left bunion removed in 1996 with no postoperative complications.   He has history of myocardial infarction and blood clot both lower extremities.  Past Medical History:  Diagnosis Date  . Anemia   . Anxiety   . Arthritis   . Cataract   . Chronic kidney disease   . Depression   . Diabetes mellitus without complication ([place])    type II   . GERD (gastroesophageal reflux disease)   . Gout   . Heart murmur   . Hypertension   . Myocardial infarction ([place])    minor Mi- years ago -   . Varicose veins of left lower extremity      Patient Active Problem List   Diagnosis Date Noted  . Paget's disease of bone 10/23/2018  . Acute renal failure (ARF) ([place]) 06/11/2016  . Hyperglycemia 06/11/2016  . Deficiency anemia 12/09/2011  . Diabetes mellitus ([place]) 12/09/2011  . Renal insufficiency 12/09/2011     Past Surgical History:  Procedure Laterality Date  . BUNIONECTOMY    . cataract    . COLONOSCOPY WITH PROPOFOL N/A 05/28/2017   Procedure: COLONOSCOPY WITH PROPOFOL;  Surgeon: Carol Ada, MD;  Location: [facility];  Service: Endoscopy;  Laterality: N/A;  . ESOPHAGOGASTRODUODENOSCOPY (EGD) WITH PROPOFOL N/A 05/28/2017   Procedure: ESOPHAGOGASTRODUODENOSCOPY (EGD) WITH PROPOFOL;  Surgeon: Carol Ada, MD;  Location: [facility];  Service: Endoscopy;  Laterality: N/A;    Medications reviewed.  No Known Allergies   Social History   Occupational History  . Not on file  Tobacco Use  . Smoking status: Former Smoker    Packs/day: 0.25    Years: 50.00   Pack years: 12.50    Types: Cigarettes  . Smokeless tobacco: Never Used  Substance and Sexual Activity  . Alcohol use: No  . Drug use: No  . Sexual activity: Not on file     Family History  Problem Relation Age of Onset  . Breast cancer Mother   . Stomach cancer Mother   . Diabetes Mellitus II Maternal Aunt      Immunization History  Administered Date(s) Administered  . Influenza-Unspecified 05/24/2013, 05/31/2018     Review of systems: Positive Findings in bold print.  Constitutional:  chills, fatigue, fever, sweats, weight change Communication: Optometrist, sign Ecologist, hand writing, iPad/Android device Head: headaches, head injury Eyes: changes in vision, eye pain, glaucoma, cataracts, macular degeneration, diplopia, glare,  light sensitivity, eyeglasses or contacts, blindness Ears nose mouth throat: hearing impaired, hearing aids,  ringing in ears, deaf, sign language,  vertigo,   nosebleeds,  rhinitis,  cold sores, snoring, swollen glands Cardiovascular: HTN, edema, arrhythmia, pacemaker in place, defibrillator in place, chest pain/tightness, chronic anticoagulation, blood clot, heart failure, MI Peripheral Vascular: leg cramps, varicose veins, blood clots, lymphedema, varicosities Respiratory:  difficulty breathing, denies congestion, SOB, wheezing, cough, emphysema Gastrointestinal: change in appetite or weight, abdominal pain, constipation, diarrhea, nausea, vomiting, vomiting blood, change in bowel habits, abdominal pain, jaundice, rectal bleeding, hemorrhoids, GERD Genitourinary:  nocturia,  pain on urination, polyuria,  blood in  urine, Foley catheter, urinary urgency, ESRD on hemodialysis Musculoskeletal: amputation, cramping, stiff joints, painful joints, decreased joint motion, fractures, OA, gout, hemiplegia, paraplegia, uses cane, wheelchair bound, uses walker, uses rollator Skin: +changes in toenails, color change, dryness, itching, mole changes,  rash,  wound(s) Neurological: headaches, numbness in feet, paresthesias in feet, burning in feet, fainting,  seizures, change in speech. denies headaches, memory problems/poor historian, cerebral palsy, weakness, paralysis, CVA, TIA Endocrine: diabetes, hypothyroidism, hyperthyroidism,  goiter, dry mouth, flushing, heat intolerance,  cold intolerance,  excessive thirst, denies polyuria,  nocturia Hematological:  easy bleeding, excessive bleeding, easy bruising, enlarged lymph nodes, on long term blood thinner, history of past transusions Allergy/immunological:  hives, eczema, frequent infections, multiple drug allergies, seasonal allergies, transplant recipient, multiple food allergies Psychiatric:  anxiety, depression, mood disorder, suicidal ideations, hallucinations, insomnia  Objective: Vitals:   01/18/19 0943  BP: (!) 147/78  Temp: 97.9 F (36.6 C)    Vascular Examination: Capillary refill time immediate x10 digits.  Dorsalis pedis pulses 2/4 bilaterally.  Posterior tibial pulses 2/4 bilaterally.  No digital hair x 10 digits.  Skin temperature gradient WNL b/l  Dermatological Examination: Skin with normal turgor, texture and tone b/l.  Hyperkeratotic lesion noted medial aspect left second digit interdigitally at the proximal interphalangeal joint.  There is no erythema, no edema, no drainage, no flocculence noted.  Toenails 1-5 b/l discolored, thick, dystrophic with subungual debris and pain with palpation to nailbeds due to thickness of nails.  Diffuse scaling noted peripherally and plantarly b/l feet with mild foot odor.  No interdigital macerations.  No blisters, no weeping. No signs of secondary bacterial infection noted.  Musculoskeletal: Muscle strength 5/5 to all LE muscle groups.  Hallux abductovalgus with bunion deformity bilaterally.  Hammertoe deformity 2 through 5 bilaterally.  Neurological: Sensation intact 5/5 bilaterally with 10 gram monofilament.  Vibratory  sensation intact bilaterally.  Assessment: 1. Painful onychomycosis toenails 1-5 b/l  2. Interdigital corn medial aspect left second digit PIPJ 3. Tinea pedis 4. NIDDM  Plan: 1. Discussed diabetic foot care principles, onychomycosis and treatment options available.  Literature dispensed on today. 2. Toenails 1-5 b/l were debrided in length and girth without iatrogenic bleeding. 3. Prescription written for ciclopirox cream 0.77%.  Patient is to apply to both feet and between toes twice daily for 4 weeks. 4. Corn(s) pared left second digit utilizing sterile scalpel blade.  Light bleeding addressed with Lumicain hemostatic solution.  Triple antibiotic ointment applied.  Patient to apply triple antibiotic ointment to area once daily for 1 week.  Call if he encounters any problems.  Silicone toe separator dispensed for daily wear between left great and left second toe to minimize corn formation. 5. Patient to continue soft, supportive shoe gear daily.  Patient does qualify for diabetic shoes based on today's examination.Per Medicare guidelines, patient's feet need to be evaluated by an MD/DO managing patient's diabetes and diabetic shoe certification form needs to be signed by the MD/DO. If patient's diabetes is being managed by an Endocrinologist, the Endocrinologist must evaluate patient's feet and sign the Medicare diabetic shoe certification form. 6. Patient to report any pedal injuries to medical professional immediately. 7. Follow up 10 weeks. 8. Patient/POA to call should there be a concern in the interim.

## 2019-01-30 ENCOUNTER — Other Ambulatory Visit (HOSPITAL_COMMUNITY): Payer: Self-pay | Admitting: *Deleted

## 2019-01-30 ENCOUNTER — Other Ambulatory Visit: Payer: Self-pay

## 2019-01-31 ENCOUNTER — Encounter (HOSPITAL_COMMUNITY): Payer: Medicare Other

## 2019-01-31 ENCOUNTER — Ambulatory Visit (HOSPITAL_COMMUNITY)
Admission: RE | Admit: 2019-01-31 | Discharge: 2019-01-31 | Disposition: A | Discharge status: Home / Self Care | Payer: Medicare Other | Source: Ambulatory Visit | Attending: Nephrology | Admitting: Nephrology

## 2019-01-31 VITALS — BP 132/89 | HR 72 | Temp 98.6°F | Resp 20 | Ht 71.0 in | Wt 206.0 lb

## 2019-01-31 DIAGNOSIS — N289 Disorder of kidney and ureter, unspecified: Secondary | ICD-10-CM | POA: Insufficient documentation

## 2019-01-31 LAB — POCT HEMOGLOBIN-HEMACUE: Hemoglobin: 10.6 g/dL — ABNORMAL LOW (ref 13.0–17.0)

## 2019-01-31 MED ORDER — SODIUM CHLORIDE 0.9 % IV SOLN
510.0000 mg | Freq: Once | INTRAVENOUS | Status: AC
  Administered 2019-01-31: 510 mg via INTRAVENOUS
  Filled 2019-01-31: qty 510

## 2019-01-31 MED ORDER — EPOETIN ALFA 40000 UNIT/ML IJ SOLN: 30000.0000 [IU] | INTRAMUSCULAR | Status: DC

## 2019-01-31 MED ORDER — EPOETIN ALFA 10000 UNIT/ML IJ SOLN
INTRAMUSCULAR | Status: AC
  Administered 2019-01-31: 10000 [IU]
  Filled 2019-01-31: qty 1

## 2019-01-31 MED ORDER — EPOETIN ALFA 20000 UNIT/ML IJ SOLN
INTRAMUSCULAR | Status: AC
  Administered 2019-01-31: 20000 [IU]
  Filled 2019-01-31: qty 1

## 2019-02-13 ENCOUNTER — Ambulatory Visit: Payer: Medicare Other | Admitting: Orthotics

## 2019-02-13 ENCOUNTER — Other Ambulatory Visit: Payer: Self-pay

## 2019-02-13 DIAGNOSIS — E119 Type 2 diabetes mellitus without complications: Secondary | ICD-10-CM

## 2019-02-13 DIAGNOSIS — L84 Corns and callosities: Secondary | ICD-10-CM

## 2019-02-13 DIAGNOSIS — Z794 Long term (current) use of insulin: Secondary | ICD-10-CM

## 2019-02-13 NOTE — Progress Notes (Signed)

## 2019-02-14 ENCOUNTER — Encounter (HOSPITAL_COMMUNITY): Payer: Medicare Other

## 2019-02-14 ENCOUNTER — Ambulatory Visit (HOSPITAL_COMMUNITY)
Admission: RE | Admit: 2019-02-14 | Discharge: 2019-02-14 | Disposition: A | Discharge status: Home / Self Care | Payer: Medicare Other | Source: Ambulatory Visit | Attending: Nephrology | Admitting: Nephrology

## 2019-02-14 ENCOUNTER — Other Ambulatory Visit: Payer: Self-pay

## 2019-02-14 VITALS — BP 124/71 | HR 42 | Temp 97.2°F | Resp 20

## 2019-02-14 DIAGNOSIS — D631 Anemia in chronic kidney disease: Secondary | ICD-10-CM | POA: Diagnosis not present

## 2019-02-14 DIAGNOSIS — N289 Disorder of kidney and ureter, unspecified: Secondary | ICD-10-CM

## 2019-02-14 DIAGNOSIS — N189 Chronic kidney disease, unspecified: Secondary | ICD-10-CM | POA: Diagnosis not present

## 2019-02-14 LAB — IRON AND TIBC
Iron: 66 ug/dL (ref 45–182)
Saturation Ratios: 22 % (ref 17.9–39.5)
TIBC: 294 ug/dL (ref 250–450)
UIBC: 228 ug/dL

## 2019-02-14 LAB — POCT HEMOGLOBIN-HEMACUE: Hemoglobin: 11.7 g/dL — ABNORMAL LOW (ref 13.0–17.0)

## 2019-02-14 LAB — FERRITIN: Ferritin: 1722 ng/mL — ABNORMAL HIGH (ref 24–336)

## 2019-02-14 MED ORDER — EPOETIN ALFA 10000 UNIT/ML IJ SOLN
INTRAMUSCULAR | Status: AC
  Administered 2019-02-14: 10000 [IU]
  Filled 2019-02-14: qty 1

## 2019-02-14 MED ORDER — EPOETIN ALFA 40000 UNIT/ML IJ SOLN: 30000.0000 [IU] | INTRAMUSCULAR | Status: DC

## 2019-02-14 MED ORDER — EPOETIN ALFA 20000 UNIT/ML IJ SOLN
INTRAMUSCULAR | Status: AC
  Administered 2019-02-14: 20000 [IU]
  Filled 2019-02-14: qty 1

## 2019-02-28 ENCOUNTER — Other Ambulatory Visit: Payer: Self-pay

## 2019-02-28 ENCOUNTER — Encounter (HOSPITAL_COMMUNITY)
Admission: RE | Admit: 2019-02-28 | Discharge: 2019-02-28 | Disposition: A | Discharge status: Home / Self Care | Payer: Medicare Other | Source: Ambulatory Visit | Attending: Nephrology | Admitting: Nephrology

## 2019-02-28 VITALS — BP 149/66 | HR 40 | Temp 97.0°F | Resp 20

## 2019-02-28 DIAGNOSIS — D631 Anemia in chronic kidney disease: Secondary | ICD-10-CM | POA: Insufficient documentation

## 2019-02-28 DIAGNOSIS — N189 Chronic kidney disease, unspecified: Secondary | ICD-10-CM | POA: Diagnosis not present

## 2019-02-28 DIAGNOSIS — N289 Disorder of kidney and ureter, unspecified: Secondary | ICD-10-CM

## 2019-02-28 LAB — POCT HEMOGLOBIN-HEMACUE: Hemoglobin: 12.2 g/dL — ABNORMAL LOW (ref 13.0–17.0)

## 2019-02-28 MED ORDER — EPOETIN ALFA 40000 UNIT/ML IJ SOLN: 30000.0000 [IU] | INTRAMUSCULAR | Status: DC

## 2019-03-01 ENCOUNTER — Other Ambulatory Visit: Payer: Self-pay | Admitting: Internal Medicine

## 2019-03-02 ENCOUNTER — Other Ambulatory Visit: Payer: Self-pay | Admitting: Internal Medicine

## 2019-03-08 DIAGNOSIS — H35033 Hypertensive retinopathy, bilateral: Secondary | ICD-10-CM | POA: Diagnosis not present

## 2019-03-08 DIAGNOSIS — Z961 Presence of intraocular lens: Secondary | ICD-10-CM | POA: Diagnosis not present

## 2019-03-08 DIAGNOSIS — E119 Type 2 diabetes mellitus without complications: Secondary | ICD-10-CM | POA: Diagnosis not present

## 2019-03-08 DIAGNOSIS — H353131 Nonexudative age-related macular degeneration, bilateral, early dry stage: Secondary | ICD-10-CM | POA: Diagnosis not present

## 2019-03-08 LAB — HM DIABETES EYE EXAM

## 2019-03-10 ENCOUNTER — Encounter: Payer: Self-pay | Admitting: Internal Medicine

## 2019-03-14 ENCOUNTER — Encounter (HOSPITAL_COMMUNITY)
Admission: RE | Admit: 2019-03-14 | Discharge: 2019-03-14 | Disposition: A | Discharge status: Home / Self Care | Payer: Medicare Other | Source: Ambulatory Visit | Attending: Nephrology | Admitting: Nephrology

## 2019-03-14 ENCOUNTER — Other Ambulatory Visit: Payer: Self-pay

## 2019-03-14 DIAGNOSIS — N189 Chronic kidney disease, unspecified: Secondary | ICD-10-CM | POA: Diagnosis not present

## 2019-03-14 DIAGNOSIS — D631 Anemia in chronic kidney disease: Secondary | ICD-10-CM | POA: Diagnosis not present

## 2019-03-14 LAB — IRON AND TIBC
Iron: 119 ug/dL (ref 45–182)
Saturation Ratios: 42 % — ABNORMAL HIGH (ref 17.9–39.5)
TIBC: 283 ug/dL (ref 250–450)
UIBC: 164 ug/dL

## 2019-03-14 LAB — POCT HEMOGLOBIN-HEMACUE: Hemoglobin: 12 g/dL — ABNORMAL LOW (ref 13.0–17.0)

## 2019-03-14 LAB — FERRITIN: Ferritin: 1853 ng/mL — ABNORMAL HIGH (ref 24–336)

## 2019-03-17 ENCOUNTER — Encounter: Payer: Self-pay | Admitting: Internal Medicine

## 2019-03-21 ENCOUNTER — Other Ambulatory Visit: Payer: Self-pay

## 2019-03-21 ENCOUNTER — Ambulatory Visit (INDEPENDENT_AMBULATORY_CARE_PROVIDER_SITE_OTHER): Payer: Medicare Other | Admitting: Orthotics

## 2019-03-21 DIAGNOSIS — E119 Type 2 diabetes mellitus without complications: Secondary | ICD-10-CM

## 2019-03-21 DIAGNOSIS — Z794 Long term (current) use of insulin: Secondary | ICD-10-CM

## 2019-03-21 DIAGNOSIS — L84 Corns and callosities: Secondary | ICD-10-CM

## 2019-03-21 NOTE — Progress Notes (Signed)

## 2019-03-23 IMAGING — DX DG ELBOW COMPLETE 3+V*R*
4 series · 4 of 4 positions shown · non-contrast
Comparison: None.

CLINICAL DATA: Acute RIGHT elbow pain following fall. Initial
encounter.

EXAM:
RIGHT ELBOW - COMPLETE 3+ VIEW

[elbow ap]
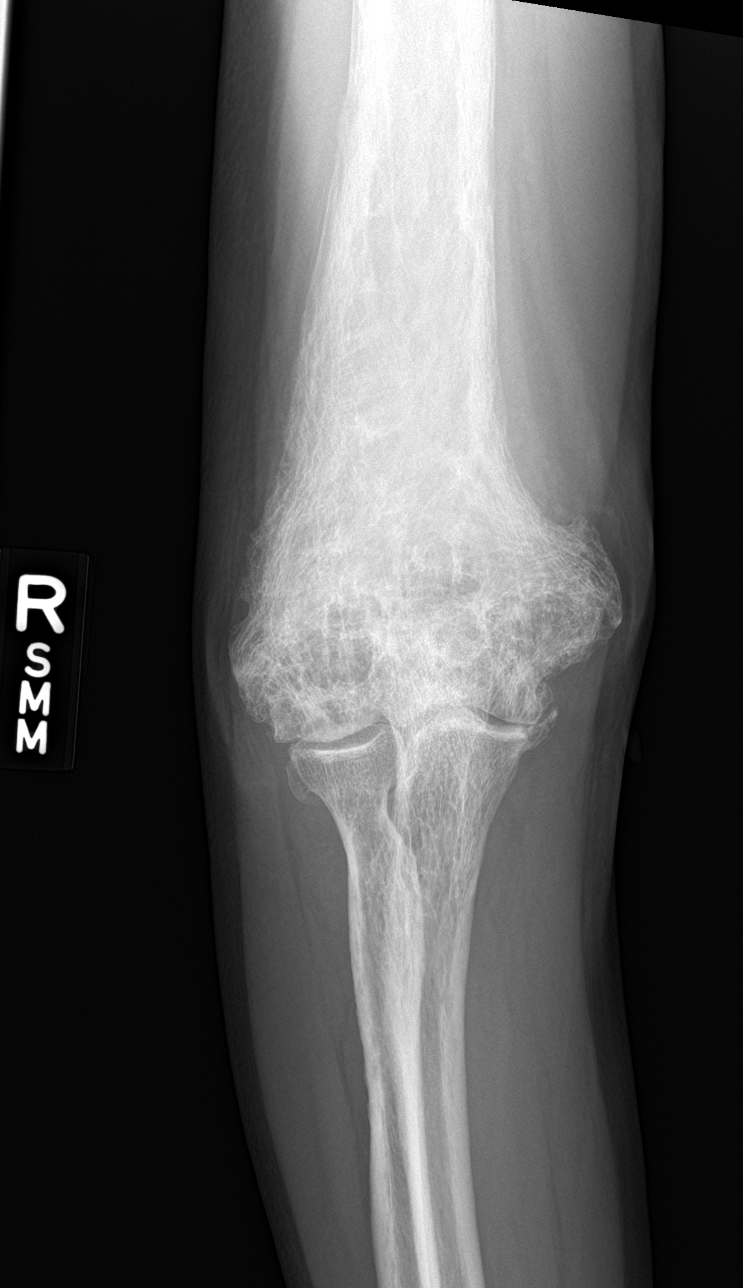

[elbow obl (1 of 2)]
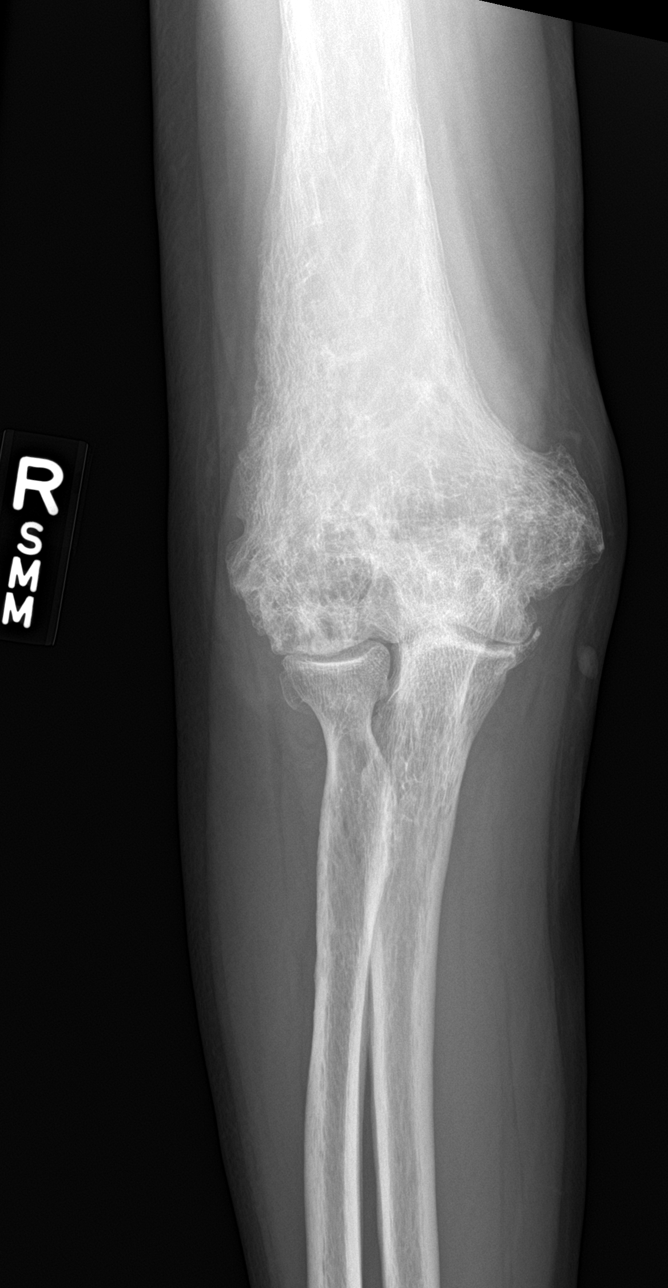

[elbow obl (2 of 2)]
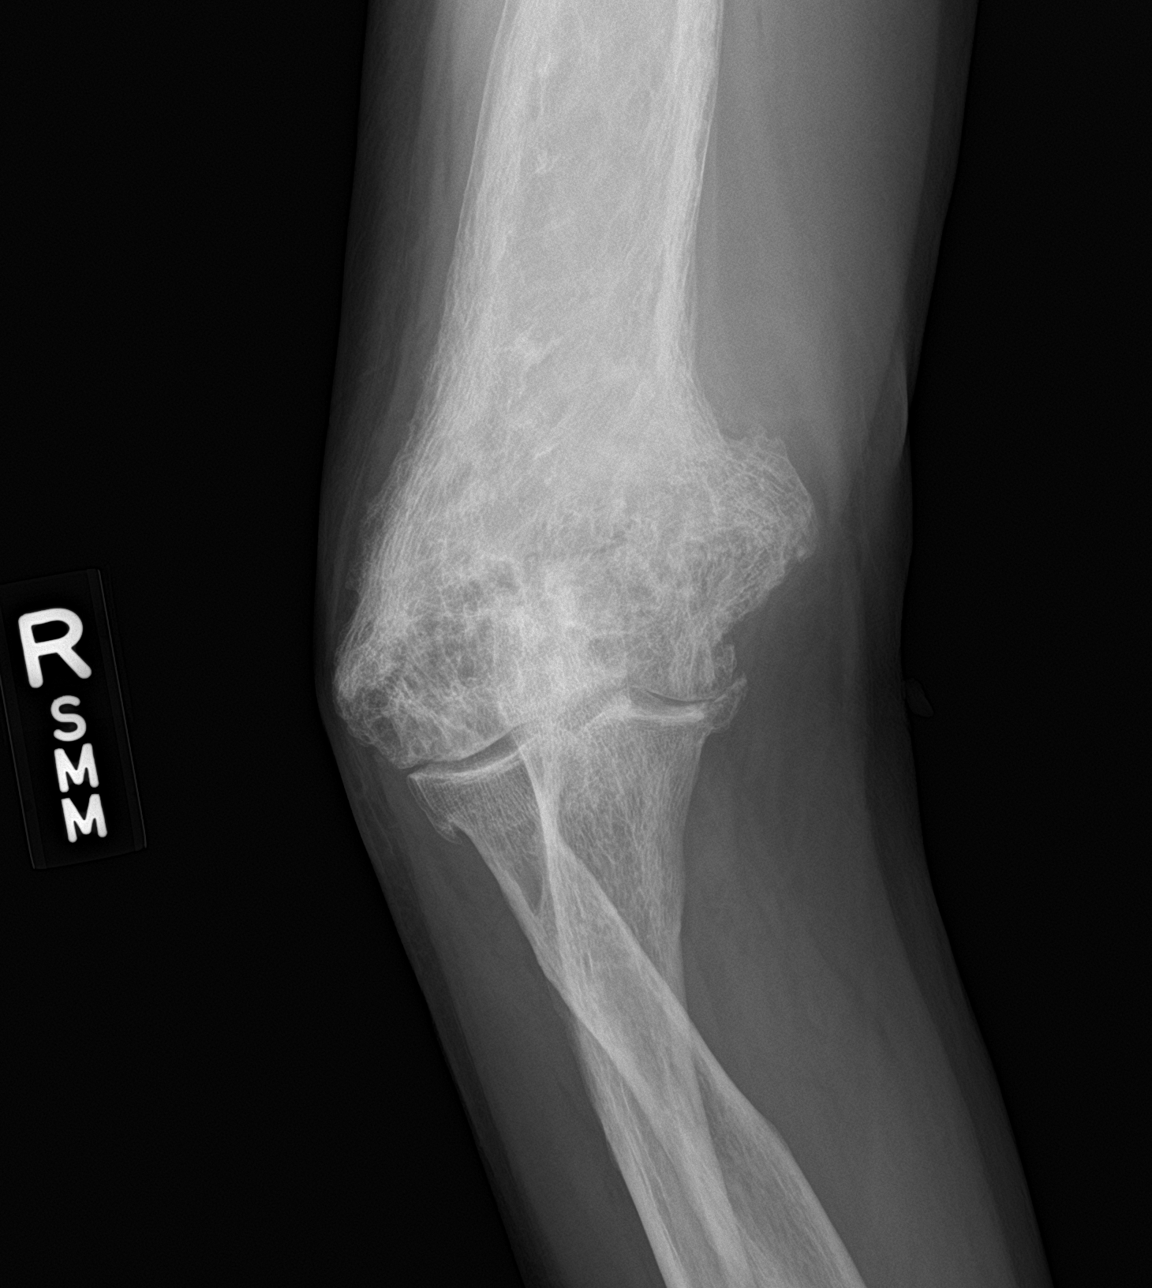

[elbow lat]
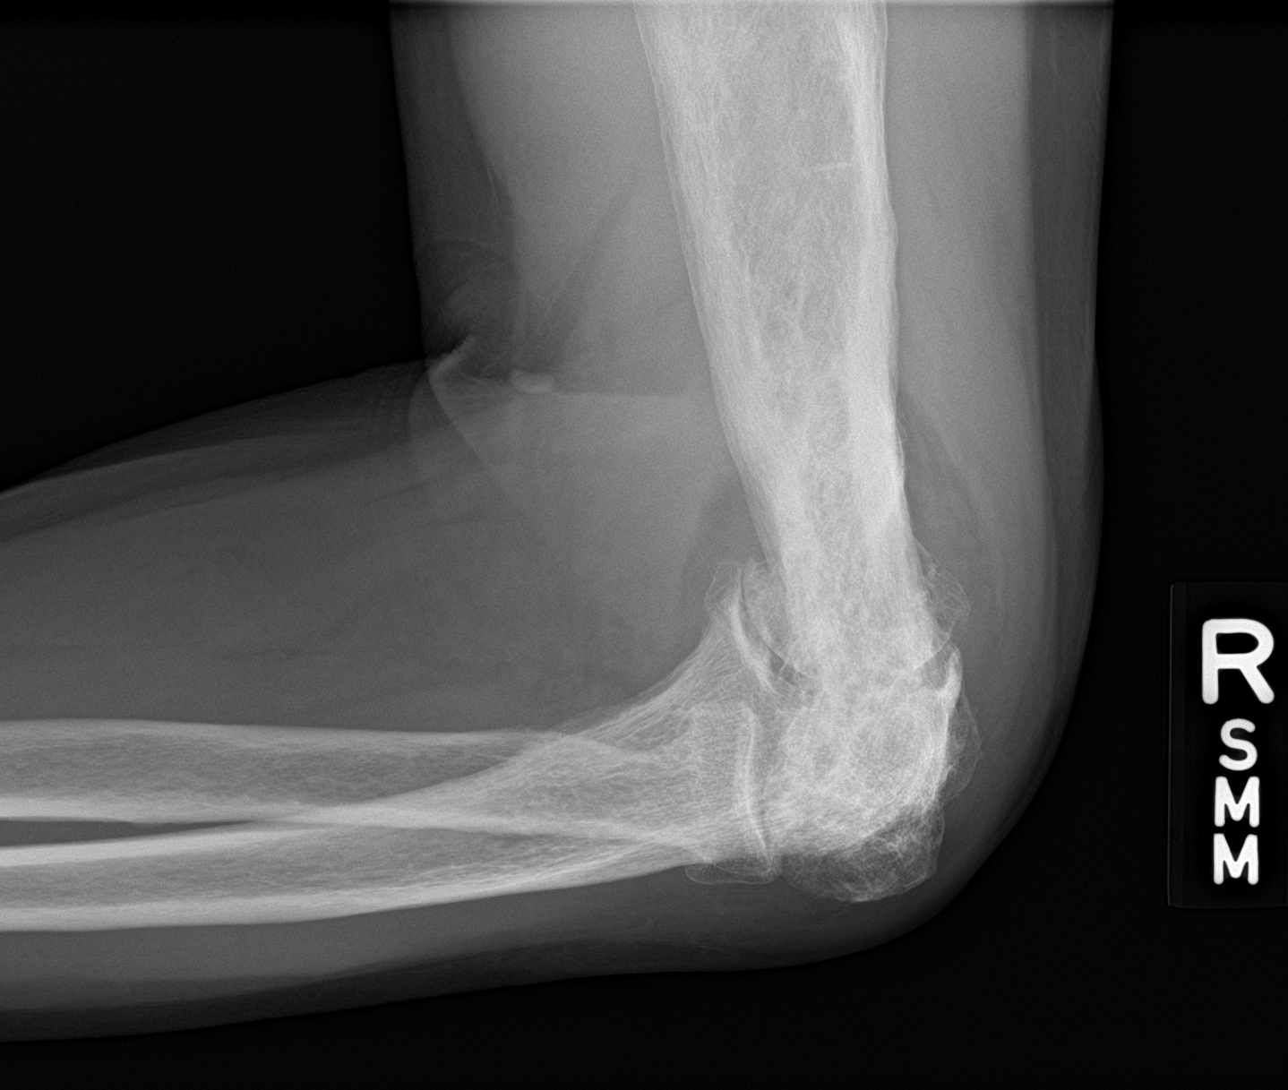

[4 of 4 positions shown; findings below may reference images not displayed]

FINDINGS: A joint effusion is noted.

Marked irregularity of the distal humerus is noted which appears
chronic. Irregularity of the proximal RIGHT humerus on remote chest
radiographs noted.

Degenerative changes at the humeral ulnar joint noted.

No definite acute fracture noted.  No dislocation.
IMPRESSION: Joint effusion without definite acute bony abnormality. Marked
irregularity of the distal humerus which limits evaluation of
fracture. Consider short-term radiographic follow-up or CT/MR if
there is strong clinical suspicion for acute bony injury.

Recommend dedicated imaging of the entire RIGHT humerus given distal
humeral abnormality, which does appear chronic however.

## 2019-03-23 IMAGING — DX DG HAND COMPLETE 3+V*R*
3 series · 3 of 3 positions shown · non-contrast
Comparison: None.

CLINICAL DATA: Acute RIGHT hand pain following fall yesterday.
Initial encounter.

EXAM:
RIGHT HAND - COMPLETE 3+ VIEW

[hand pa]
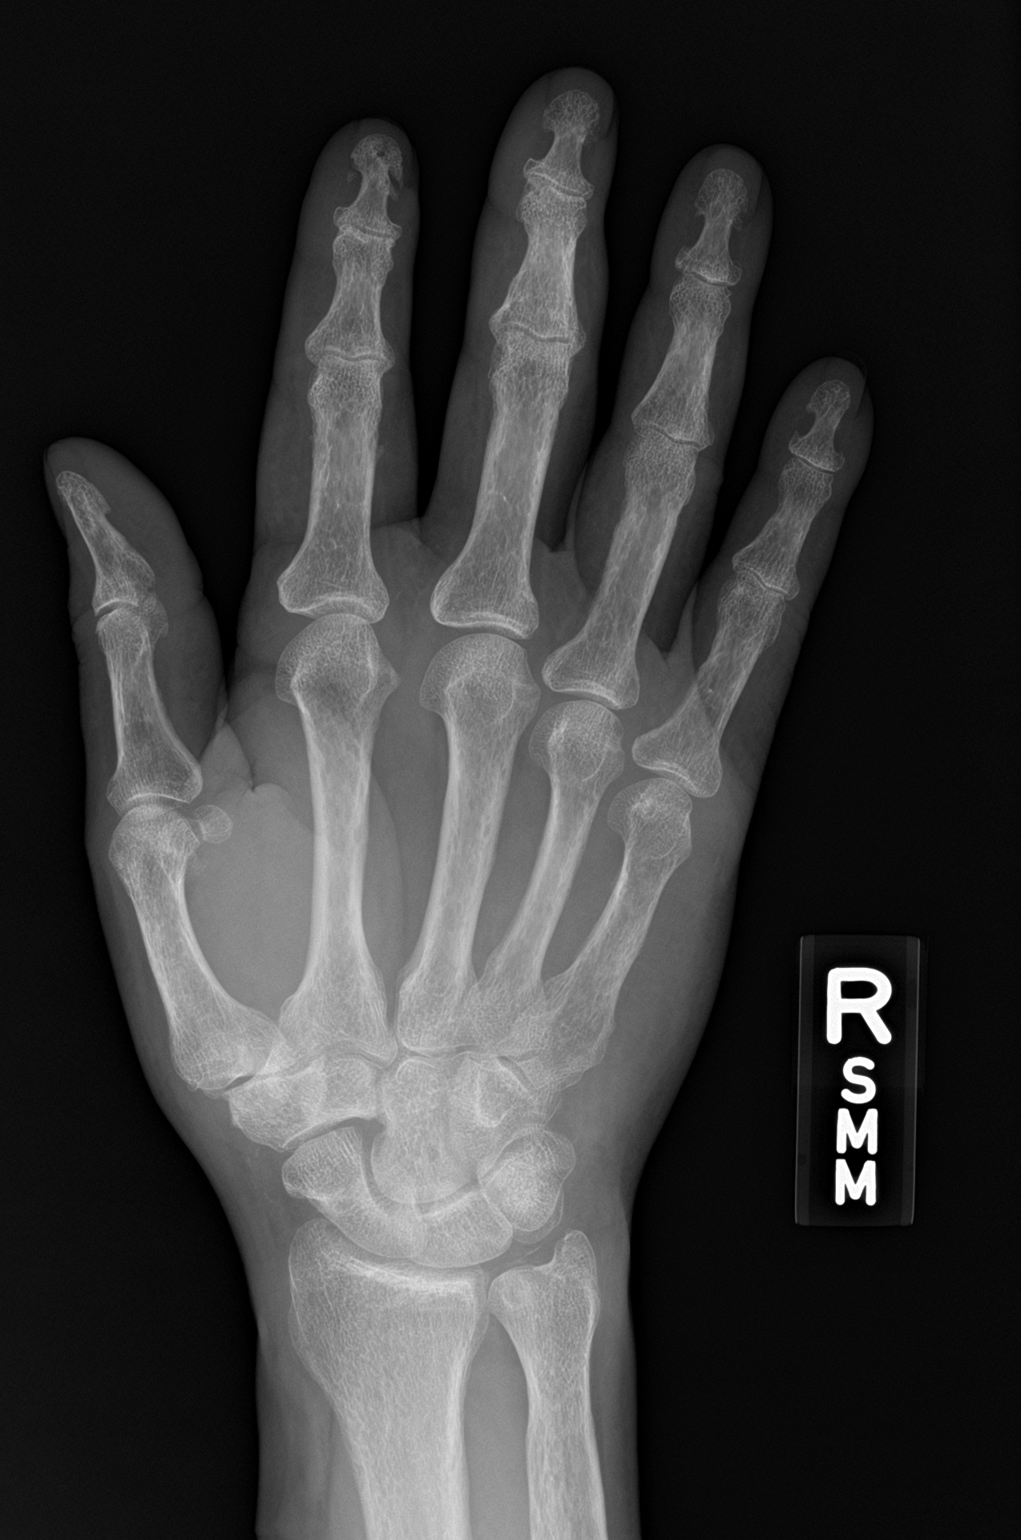

[hand obl]
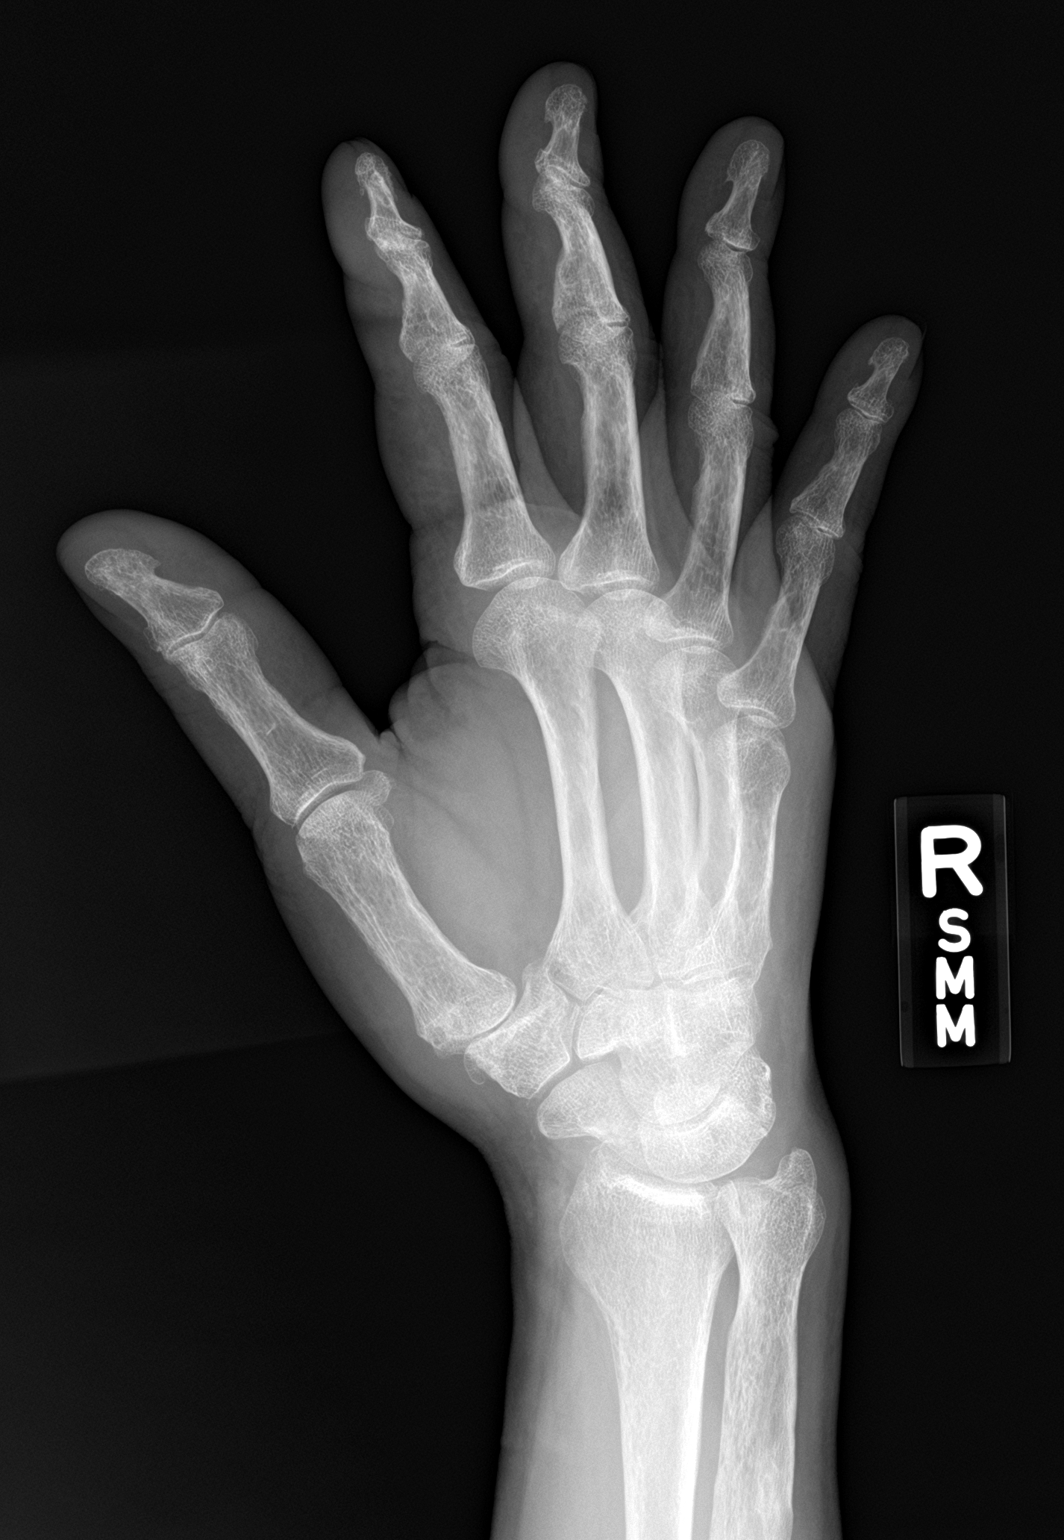

[hand lat]
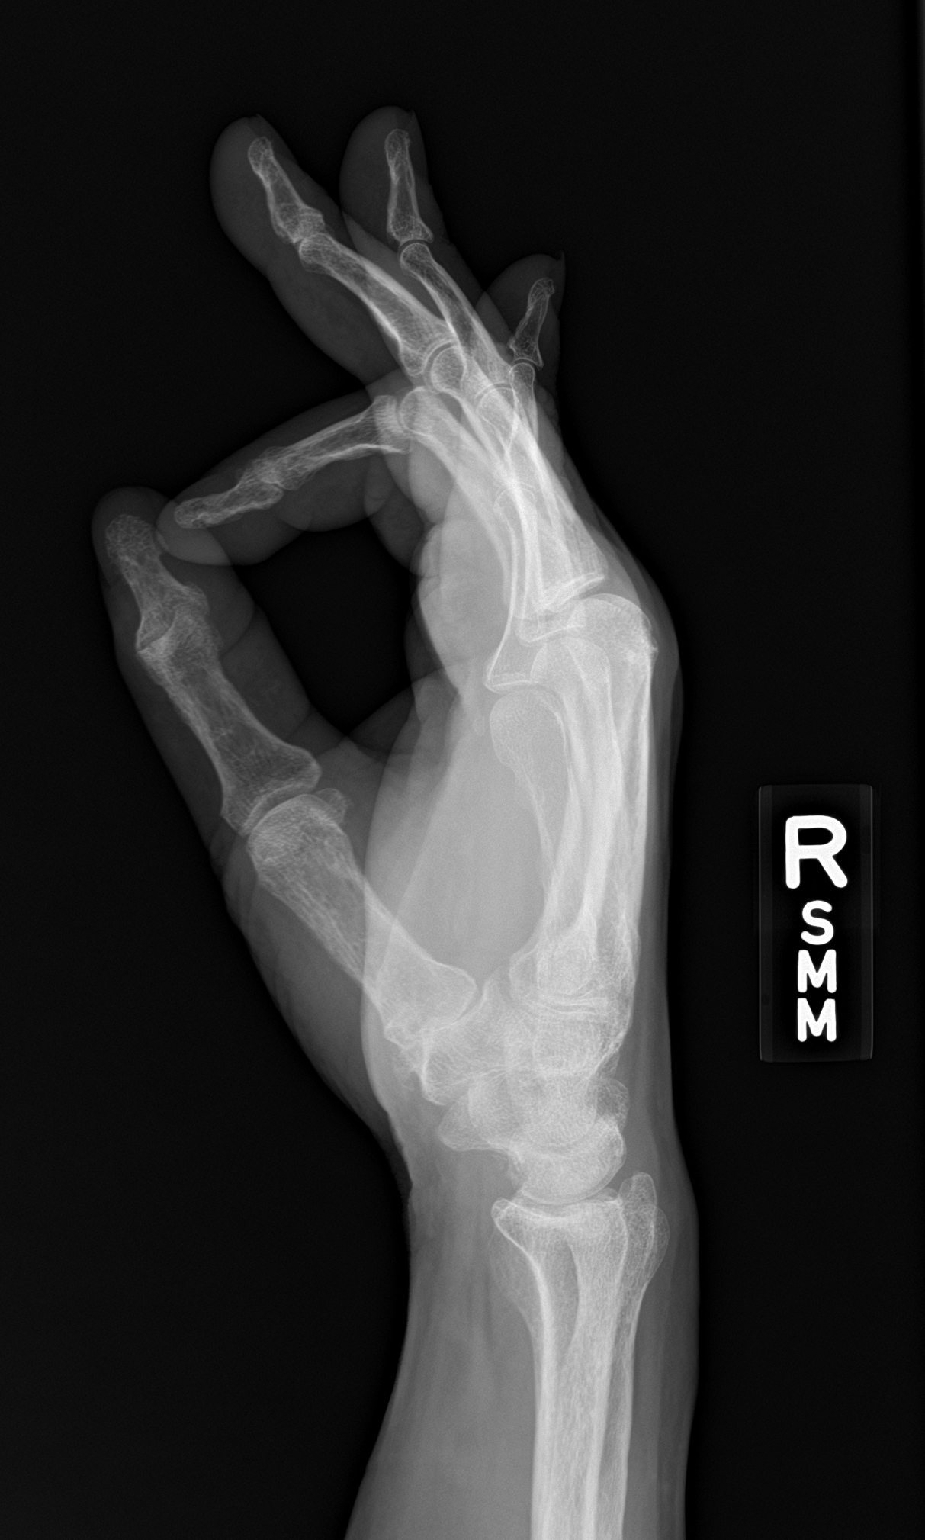

[3 of 3 positions shown; findings below may reference images not displayed]

FINDINGS: No acute fracture, subluxation or dislocation.

Mild degenerative changes at the radiocarpal joint and 1st
carpometacarpal joint noted.

Chondrocalcinosis at the triangular fibrocartilage noted.
IMPRESSION: No acute bony abnormality.

Degenerative changes and wrist chondrocalcinosis.

## 2019-03-27 ENCOUNTER — Encounter: Payer: Self-pay | Admitting: Podiatry

## 2019-03-27 ENCOUNTER — Ambulatory Visit (INDEPENDENT_AMBULATORY_CARE_PROVIDER_SITE_OTHER): Payer: Medicare Other | Admitting: Podiatry

## 2019-03-27 ENCOUNTER — Other Ambulatory Visit: Payer: Self-pay

## 2019-03-27 VITALS — Temp 97.8°F

## 2019-03-27 DIAGNOSIS — L84 Corns and callosities: Secondary | ICD-10-CM

## 2019-03-27 DIAGNOSIS — E119 Type 2 diabetes mellitus without complications: Secondary | ICD-10-CM | POA: Diagnosis not present

## 2019-03-27 DIAGNOSIS — B351 Tinea unguium: Secondary | ICD-10-CM

## 2019-03-27 DIAGNOSIS — Z794 Long term (current) use of insulin: Secondary | ICD-10-CM | POA: Diagnosis not present

## 2019-03-27 DIAGNOSIS — M79674 Pain in right toe(s): Secondary | ICD-10-CM | POA: Diagnosis not present

## 2019-03-27 DIAGNOSIS — M79675 Pain in left toe(s): Secondary | ICD-10-CM | POA: Diagnosis not present

## 2019-03-27 NOTE — Patient Instructions (Signed)
Diabetes Mellitus and Foot Care Foot care is an important part of your health, especially when you have diabetes. Diabetes may cause you to have problems because of poor blood flow (circulation) to your feet and legs, which can cause your skin to:  Become thinner and drier.  Break more easily.  Heal more slowly.  Peel and crack. You may also have nerve damage (neuropathy) in your legs and feet, causing decreased feeling in them. This means that you may not notice minor injuries to your feet that could lead to more serious problems. Noticing and addressing any potential problems early is the best way to prevent future foot problems. How to care for your feet Foot hygiene  Wash your feet daily with warm water and mild soap. Do not use hot water. Then, pat your feet and the areas between your toes until they are completely dry. Do not soak your feet as this can dry your skin.  Trim your toenails straight across. Do not dig under them or around the cuticle. File the edges of your nails with an emery board or nail file.  Apply a moisturizing lotion or petroleum jelly to the skin on your feet and to dry, brittle toenails. Use lotion that does not contain alcohol and is unscented. Do not apply lotion between your toes. Shoes and socks  Wear clean socks or stockings every day. Make sure they are not too tight. Do not wear knee-high stockings since they may decrease blood flow to your legs.  Wear shoes that fit properly and have enough cushioning. Always look in your shoes before you put them on to be sure there are no objects inside.  To break in new shoes, wear them for just a few hours a day. This prevents injuries on your feet. Wounds, scrapes, corns, and calluses  Check your feet daily for blisters, cuts, bruises, sores, and redness. If you cannot see the bottom of your feet, use a mirror or ask someone for help.  Do not cut corns or calluses or try to remove them with medicine.  If you  find a minor scrape, cut, or break in the skin on your feet, keep it and the skin around it clean and dry. You may clean these areas with mild soap and water. Do not clean the area with peroxide, alcohol, or iodine.  If you have a wound, scrape, corn, or callus on your foot, look at it several times a day to make sure it is healing and not infected. Check for: ? Redness, swelling, or pain. ? Fluid or blood. ? Warmth. ? Pus or a bad smell. General instructions  Do not cross your legs. This may decrease blood flow to your feet.  Do not use heating pads or hot water bottles on your feet. They may burn your skin. If you have lost feeling in your feet or legs, you may not know this is happening until it is too late.  Protect your feet from hot and cold by wearing shoes, such as at the beach or on hot pavement.  Schedule a complete foot exam at least once a year (annually) or more often if you have foot problems. If you have foot problems, report any cuts, sores, or bruises to your health care provider immediately. Contact a health care provider if:  You have a medical condition that increases your risk of infection and you have any cuts, sores, or bruises on your feet.  You have an injury that is not   healing.  You have redness on your legs or feet.  You feel burning or tingling in your legs or feet.  You have pain or cramps in your legs and feet.  Your legs or feet are numb.  Your feet always feel cold.  You have pain around a toenail. Get help right away if:  You have a wound, scrape, corn, or callus on your foot and: ? You have pain, swelling, or redness that gets worse. ? You have fluid or blood coming from the wound, scrape, corn, or callus. ? Your wound, scrape, corn, or callus feels warm to the touch. ? You have pus or a bad smell coming from the wound, scrape, corn, or callus. ? You have a fever. ? You have a red line going up your leg. Summary  Check your feet every day  for cuts, sores, red spots, swelling, and blisters.  Moisturize feet and legs daily.  Wear shoes that fit properly and have enough cushioning.  If you have foot problems, report any cuts, sores, or bruises to your health care provider immediately.  Schedule a complete foot exam at least once a year (annually) or more often if you have foot problems. This information is not intended to replace advice given to you by your health care provider. Make sure you discuss any questions you have with your health care provider. Document Released: 08/07/2000 Document Revised: 09/22/2017 Document Reviewed: 09/11/2016 Elsevier Patient Education  2020 [employer]  

## 2019-03-28 ENCOUNTER — Ambulatory Visit (HOSPITAL_COMMUNITY)
Admission: RE | Admit: 2019-03-28 | Discharge: 2019-03-28 | Disposition: A | Discharge status: Home / Self Care | Payer: Medicare Other | Source: Ambulatory Visit | Attending: Nephrology | Admitting: Nephrology

## 2019-03-28 VITALS — BP 122/86 | HR 83 | Temp 97.3°F | Resp 20

## 2019-03-28 DIAGNOSIS — N289 Disorder of kidney and ureter, unspecified: Secondary | ICD-10-CM | POA: Diagnosis not present

## 2019-03-28 LAB — POCT HEMOGLOBIN-HEMACUE: Hemoglobin: 11.3 g/dL — ABNORMAL LOW (ref 13.0–17.0)

## 2019-03-28 MED ORDER — EPOETIN ALFA 10000 UNIT/ML IJ SOLN
INTRAMUSCULAR | Status: AC
  Filled 2019-03-28: qty 1

## 2019-03-28 MED ORDER — EPOETIN ALFA 40000 UNIT/ML IJ SOLN
30000.0000 [IU] | INTRAMUSCULAR | Status: DC
  Administered 2019-03-28: 30000 [IU] via SUBCUTANEOUS

## 2019-03-28 MED ORDER — EPOETIN ALFA 20000 UNIT/ML IJ SOLN
INTRAMUSCULAR | Status: AC
  Filled 2019-03-28: qty 1

## 2019-03-28 NOTE — Progress Notes (Signed)
Subjective: Alec Solis is a 85 y.o. y.o. male who presents today for prevenative diabetic foot care for painful mycotic toenails and painful corn which interfere with daily activities. Pain is aggravated when wearing enclosed shoe gear and relieved with periodic professional debridement.  [name], MD is his PCP. Last visit was 01/17/2019.  He relates his left 2nd digit is painful due to the corn on today's visit. He denies any redness, drainage or swelling of digit. He states he did pick a piece of hard skin from it.    Current Outpatient Medications:  .  acetaminophen (TYLENOL) 500 MG tablet, Take 2 tablets (1,000 mg total) by mouth every 8 (eight) hours as needed. (Patient taking differently: Take 500 mg by mouth every 8 (eight) hours as needed for moderate pain. ), Disp: 30 tablet, Rfl: 0 .  allopurinol (ZYLOPRIM) 100 MG tablet, Take 1 tablet (100 mg total) by mouth 2 (two) times daily., Disp: 180 tablet, Rfl: 1 .  amLODipine (NORVASC) 5 MG tablet, TAKE 1 TABLET BY MOUTH DAILY, Disp: 90 tablet, Rfl: 0 .  aspirin 81 MG tablet, Take 81 mg by mouth daily., Disp: , Rfl:  .  B-D ULTRAFINE III SHORT PEN 31G X 8 MM MISC, USE AS DIRECTED FOUR TIMES DAILY, Disp: 100 each, Rfl: 3 .  calcitRIOL (ROCALTROL) 0.25 MCG capsule, Take 0.25 mcg by mouth See admin instructions. Monday Thursday Sunday,  Pt takes every 3rd day, Disp: , Rfl:  .  carboxymethylcellulose (REFRESH PLUS) 0.5 % SOLN, 1 drop 3 (three) times daily as needed. As needed, Disp: , Rfl:  .  carvedilol (COREG) 25 MG tablet, Take 12.5 mg by mouth 2 (two) times daily with a meal. , Disp: , Rfl:  .  ciclopirox (LOPROX) 0.77 % cream, Apply to both feet and between toes bid x 4 weeks, Disp: 30 g, Rfl: 1 .  clopidogrel (PLAVIX) 75 MG tablet, TAKE 1 TABLET BY MOUTH DAILY, Disp: 90 tablet, Rfl: 0 .  Ferrous Gluconate (IRON 27 PO), Take by mouth. Patient takes as needed, Disp: , Rfl:  .  furosemide (LASIX) 40 MG tablet, Take 40 mg by mouth  daily., Disp: , Rfl:  .  Ginger, Zingiber officinalis, (GINGER ROOT) 550 MG CAPS, Take by mouth. Patient takes prn, Disp: , Rfl:  .  glucose blood (TRUE METRIX BLOOD GLUCOSE TEST) test strip, 1 each by Other route as needed for other. Use as directed to check blood sugars, Disp: 150 each, Rfl: 11 .  HYDROcodone-acetaminophen (NORCO/VICODIN) 5-325 MG tablet, Take 1 tablet by mouth 2 (two) times daily as needed for moderate pain., Disp: 30 tablet, Rfl: 0 .  Icosapent Ethyl (VASCEPA) 1 g CAPS, 2 bid, Disp: 360 capsule, Rfl: 1 .  Insulin Aspart FlexPen 100 UNIT/ML SOPN, INJECT 4 UNITS INTO THE SKIN BEFORE BREAKFAST,LUNCH, AND DINNER, Disp: 15 mL, Rfl: 0 .  Insulin Degludec (TRESIBA FLEXTOUCH) 200 UNIT/ML SOPN, Inject 22 Units into the skin daily., Disp: 9 pen, Rfl: 0 .  Insulin Pen Needle 32G X 8 MM MISC, Use as directed, Disp: 100 each, Rfl: 2 .  Omega-3 Fatty Acids (FISH OIL PO), Take by mouth., Disp: , Rfl:  .  omeprazole (PRILOSEC) 40 MG capsule, TAKE 1 CAPSULE BY MOUTH EVERY DAY BEFORE A MEAL, Disp: 90 capsule, Rfl: 1 .  sodium bicarbonate 650 MG tablet, Take 1,300 mg by mouth 2 (two) times daily., Disp: , Rfl:  .  tamsulosin (FLOMAX) 0.4 MG CAPS capsule, TAKE ONE CAPSULE BY MOUTH TWICE  DAILY, Disp: 180 capsule, Rfl: 0 .  TRUEPLUS LANCETS 30G MISC, USE AS DIRECTED TO CHECK BLOOD SUGAR THREE TIMES DAILY, Disp: 300 each, Rfl: 11  No Known Allergies  Objective: Vitals:   03/27/19 1037  Temp: 97.8 F (36.6 C)    Vascular Examination: Capillary refill time immediate x 10 digits.  Dorsalis pedis pulses 2/4 b/l.  Posterior tibial pulses 2/4 b/l.  Digital hair absent x 10 digits.  Skin temperature gradient WNL b/l.  Dermatological Examination: Skin with normal turgor, texture and tone b/l.  Toenails 1-5 b/l discolored, thick, dystrophic with subungual debris and pain with palpation to nailbeds due to thickness of nails.  Hyperkeratotic lesion medial aspect left 2nd digit PIPJ. No  erythema, no edema, no drainage, no flocculence noted.   Musculoskeletal: Muscle strength 5/5 to all LE muscle groups.  HAV with bunion deformity b/l.  Hammertoes 2-5 b/l.  Neurological: Sensation intact 5/5 b/l with 10 gram monofilament.  Vibratory sensation intact b/l.  Assessment: 1. Painful onychomycosis toenails 1-5 b/l 2.   Corn left 2nd digit 4.  NIDDM  Plan: 1. Continue diabetic foot care principles. Literature dispensed on today. 2. Toenails 1-5 b/l were debrided in length and girth without iatrogenic bleeding. 3. Hyperkeratotic lesion left 2nd digit pared with sterile scalpel blade without incident. Dispensed silicone toe cap for left great toe and silicone toe cap left 2nd digit for daily use and protection.  4. Patient to continue soft, supportive shoe gear daily. 5. Patient to report any pedal injuries to medical professional immediately. 6. Follow up 9 weeks.  7. Patient/POA to call should there be a concern in the interim.

## 2019-03-29 MED FILL — Epoetin Alfa Inj 20000 Unit/ML: INTRAMUSCULAR | Qty: 1 | Status: AC

## 2019-03-29 MED FILL — Epoetin Alfa Inj 10000 Unit/ML: INTRAMUSCULAR | Qty: 1 | Status: AC

## 2019-04-03 ENCOUNTER — Other Ambulatory Visit: Payer: Self-pay | Admitting: Internal Medicine

## 2019-04-11 ENCOUNTER — Other Ambulatory Visit: Payer: Self-pay

## 2019-04-11 ENCOUNTER — Ambulatory Visit (HOSPITAL_COMMUNITY)
Admission: RE | Admit: 2019-04-11 | Discharge: 2019-04-11 | Disposition: A | Discharge status: Home / Self Care | Payer: Medicare Other | Source: Ambulatory Visit | Attending: Nephrology | Admitting: Nephrology

## 2019-04-11 VITALS — BP 138/96 | HR 86 | Temp 96.6°F | Resp 20

## 2019-04-11 DIAGNOSIS — D631 Anemia in chronic kidney disease: Secondary | ICD-10-CM | POA: Insufficient documentation

## 2019-04-11 DIAGNOSIS — N289 Disorder of kidney and ureter, unspecified: Secondary | ICD-10-CM | POA: Diagnosis not present

## 2019-04-11 DIAGNOSIS — N183 Chronic kidney disease, stage 3 (moderate): Secondary | ICD-10-CM | POA: Diagnosis not present

## 2019-04-11 LAB — IRON AND TIBC
Iron: 74 ug/dL (ref 45–182)
Saturation Ratios: 23 % (ref 17.9–39.5)
TIBC: 315 ug/dL (ref 250–450)
UIBC: 241 ug/dL

## 2019-04-11 LAB — FERRITIN: Ferritin: 1803 ng/mL — ABNORMAL HIGH (ref 24–336)

## 2019-04-11 LAB — POCT HEMOGLOBIN-HEMACUE: Hemoglobin: 11.9 g/dL — ABNORMAL LOW (ref 13.0–17.0)

## 2019-04-11 MED ORDER — EPOETIN ALFA 20000 UNIT/ML IJ SOLN
INTRAMUSCULAR | Status: AC
  Administered 2019-04-11: 20000 [IU] via SUBCUTANEOUS
  Filled 2019-04-11: qty 1

## 2019-04-11 MED ORDER — EPOETIN ALFA 10000 UNIT/ML IJ SOLN
INTRAMUSCULAR | Status: AC
  Administered 2019-04-11: 10000 [IU] via SUBCUTANEOUS
  Filled 2019-04-11: qty 1

## 2019-04-11 MED ORDER — EPOETIN ALFA 40000 UNIT/ML IJ SOLN: 30000.0000 [IU] | INTRAMUSCULAR | Status: DC

## 2019-04-19 ENCOUNTER — Ambulatory Visit: Payer: Medicare Other

## 2019-04-19 ENCOUNTER — Other Ambulatory Visit: Payer: Self-pay

## 2019-04-19 ENCOUNTER — Encounter: Payer: Self-pay | Admitting: Internal Medicine

## 2019-04-19 ENCOUNTER — Ambulatory Visit (INDEPENDENT_AMBULATORY_CARE_PROVIDER_SITE_OTHER): Payer: Medicare Other | Admitting: Internal Medicine

## 2019-04-19 ENCOUNTER — Other Ambulatory Visit: Payer: Self-pay | Admitting: Internal Medicine

## 2019-04-19 VITALS — BP 136/82 | HR 82 | Temp 98.9°F | Ht 71.0 in | Wt 200.2 lb

## 2019-04-19 DIAGNOSIS — N183 Chronic kidney disease, stage 3 unspecified: Secondary | ICD-10-CM

## 2019-04-19 DIAGNOSIS — Z23 Encounter for immunization: Secondary | ICD-10-CM

## 2019-04-19 DIAGNOSIS — I129 Hypertensive chronic kidney disease with stage 1 through stage 4 chronic kidney disease, or unspecified chronic kidney disease: Secondary | ICD-10-CM

## 2019-04-19 DIAGNOSIS — E1122 Type 2 diabetes mellitus with diabetic chronic kidney disease: Secondary | ICD-10-CM

## 2019-04-19 DIAGNOSIS — R0982 Postnasal drip: Secondary | ICD-10-CM

## 2019-04-19 MED ORDER — ROSUVASTATIN CALCIUM 20 MG PO TABS: ORAL_TABLET | ORAL | 1 refills | Status: DC

## 2019-04-19 NOTE — Patient Instructions (Signed)
Diabetic Ketoacidosis °Diabetic ketoacidosis is a serious complication of diabetes. This condition develops when there is not enough insulin in the body. Insulin is an hormone that regulates blood sugar levels in the body. Normally, insulin allows glucose to enter the cells in the body. The cells break down glucose for energy. Without enough insulin, the body cannot break down glucose, so it breaks down fats instead. This leads to high blood glucose levels in the body and the production of acids that are called ketones. Ketones are poisonous at high levels. °If diabetic ketoacidosis is not treated, it can cause severe dehydration and can lead to a coma or death. °What are the causes? °This condition develops when a lack of insulin causes the body to break down fats instead of glucose. This may be triggered by: °· Stress on the body. This stress is brought on by an illness. °· Infection. °· Medicines that raise blood glucose levels. °· Not taking diabetes medicine. °· New onset of type 1 diabetes mellitus. °What are the signs or symptoms? °Symptoms of this condition include: °· Fatigue. °· Weight loss. °· Excessive thirst. °· Light-headedness. °· Fruity or sweet-smelling breath. °· Excessive urination. °· Vision changes. °· Confusion or irritability. °· Nausea. °· Vomiting. °· Rapid breathing. °· Abdominal pain. °· Feeling flushed. °How is this diagnosed? °This condition is diagnosed based on your medical history, a physical exam, and blood tests. You may also have a urine test to check for ketones. °How is this treated? °This condition may be treated with: °· Fluid replacement. This may be done to correct dehydration. °· Insulin injections. These may be given through the skin or through an IV tube. °· Electrolyte replacement. Electrolytes are minerals in your blood. Electrolytes such as potassium and sodium may be given in pill form or through an IV tube. °· Antibiotic medicines. These may be prescribed if your  condition was caused by an infection. °Diabetic ketoacidosis is a serious medical condition. You may need emergency treatment in the hospital to monitor your condition. °Follow these instructions at home: °Eating and drinking °· Drink enough fluids to keep your urine clear or pale yellow. °· If you are not able to eat, drink clear fluids in small amounts as you are able. Clear fluids include water, ice chips, fruit juice with water added (diluted), and low-calorie sports drinks. You may also have sugar-free jello or popsicles. °· If you are able to eat, follow your usual diet and drink sugar-free liquids, such as water. °Medicines °· Take over-the-counter and prescription medicines only as told by your health care provider. °· Continue to take insulin and other diabetes medicines as told by your health care provider. °· If you were prescribed an antibiotic, take it as told by your health care provider. Do not stop taking the antibiotic even if you start to feel better. °General instructions ° °· Check your urine for ketones when you are ill and as told by your health care provider. °? If your blood glucose is 240 mg/dL (13.3 mmol/L) or higher, check your urine ketones every 4-6 hours. °· Check your blood glucose every day, as often as told by your health care provider. °? If your blood glucose is high, drink plenty of fluids. This helps to flush out ketones. °? If your blood glucose is above your target for 2 tests in a row, contact your health care provider. °· Carry a medical alert card or wear medical alert jewelry that says that you have diabetes. °· Rest   and exercise only as told by your health care provider. Do not exercise when your blood glucose is high and you have ketones in your urine. °· If you get sick, call your health care provider and begin treatment quickly. Your body often needs extra insulin to fight an illness. Check your blood glucose every 4-6 hours when you are sick. °· Keep all follow-up  visits as told by your health care provider. This is important. °Contact a health care provider if: °· Your blood glucose level is higher than 240 mg/dL (13.3 mmol/L) for 2 days in a row. °· You have moderate or large ketones in your urine. °· You have a fever. °· You cannot eat or drink without vomiting. °· You have been vomiting for more than 2 hours. °· You continue to have symptoms of diabetic ketoacidosis. °· You develop new symptoms. °Get help right away if: °· Your blood glucose monitor reads “high” even when you are taking insulin. °· You faint. °· You have chest pain. °· You have trouble breathing. °· You have sudden trouble speaking or swallowing. °· You have vomiting or diarrhea that gets worse after 3 hours. °· You are unable to stay awake. °· You have trouble thinking. °· You are severely dehydrated. Symptoms of severe dehydration include: °? Extreme thirst. °? Dry mouth. °? Rapid breathing. °These symptoms may represent a serious problem that is an emergency. Do not wait to see if the symptoms will go away. Get medical help right away. Call your local emergency services (911 in the [place] Do not drive yourself to the hospital. °Summary °· Diabetic ketoacidosis is a serious complication of diabetes. This condition develops when there is not enough insulin in the body. °· This condition is diagnosed based on your medical history, a physical exam, and blood tests. You may also have a urine test to check for ketones. °· Diabetic ketoacidosis is a serious medical condition. You may need emergency treatment in the hospital to monitor your condition. °· Contact your health care provider if your blood glucose is higher than 240 mg/dl for 2 days in a row or if you have moderate or large ketones in your urine. °This information is not intended to replace advice given to you by your health care provider. Make sure you discuss any questions you have with your health care provider. °Document Released: 08/07/2000  Document Revised: 09/25/2016 Document Reviewed: 09/14/2016 °Elsevier Patient Education © 2020 Elsevier Inc. ° °

## 2019-04-20 LAB — BMP8+EGFR
BUN/Creatinine Ratio: 12 (ref 10–24)
BUN: 22 mg/dL (ref 8–27)
CO2: 19 mmol/L — ABNORMAL LOW (ref 20–29)
Calcium: 10.2 mg/dL (ref 8.6–10.2)
Chloride: 103 mmol/L (ref 96–106)
Creatinine, Ser: 1.84 mg/dL — ABNORMAL HIGH (ref 0.76–1.27)
GFR calc Af Amer: 38 mL/min/{1.73_m2} — ABNORMAL LOW (ref >59)
GFR calc non Af Amer: 33 mL/min/{1.73_m2} — ABNORMAL LOW (ref >59)
Glucose: 199 mg/dL — ABNORMAL HIGH (ref 65–99)
Potassium: 4.6 mmol/L (ref 3.5–5.2)
Sodium: 140 mmol/L (ref 134–144)

## 2019-04-20 LAB — HEMOGLOBIN A1C
Est. average glucose Bld gHb Est-mCnc: 194 mg/dL
Hgb A1c MFr Bld: 8.4 % — ABNORMAL HIGH (ref 4.8–5.6)

## 2019-04-22 NOTE — Progress Notes (Signed)
Subjective:     Patient ID: Alec Solis , male    DOB: September 09, 1932 , 85 y.o.   MRN: [id number]   Chief Complaint  Patient presents with  . Diabetes  . Hypertension    HPI  Diabetes He presents for his follow-up diabetic visit. He has type 2 diabetes mellitus. There are no hypoglycemic associated symptoms. There are no diabetic associated symptoms. Pertinent negatives for diabetes include no blurred vision and no chest pain. There are no hypoglycemic complications. Diabetic complications include nephropathy. Risk factors for coronary artery disease include diabetes mellitus, dyslipidemia, male sex, sedentary lifestyle and hypertension. Current diabetic treatment includes insulin injections. He is compliant with treatment some of the time. He is following a generally healthy diet. When asked about meal planning, he reported none. He participates in exercise intermittently. His breakfast blood glucose is taken between 9-10 am. His breakfast blood glucose range is generally 140-180 mg/dl. An ACE inhibitor/angiotensin II receptor blocker is not being taken.  Hypertension This is a chronic problem. The current episode started more than 1 year ago. The problem has been gradually improving since onset. The problem is controlled. Pertinent negatives include no blurred vision, chest pain, palpitations or shortness of breath. Risk factors for coronary artery disease include diabetes mellitus, dyslipidemia, male gender and sedentary lifestyle. Past treatments include calcium channel blockers, diuretics and beta blockers. Compliance problems include exercise and diet.  Hypertensive end-organ damage includes kidney disease.     Past Medical History:  Diagnosis Date  . Anemia   . Anxiety   . Arthritis   . Cataract   . Chronic kidney disease   . Depression   . Diabetes mellitus without complication ([place])    type II   . GERD (gastroesophageal reflux disease)   . Gout   . Heart murmur   . Hypertension    . Myocardial infarction ([place])    minor Mi- years ago -   . Varicose veins of left lower extremity      Family History  Problem Relation Age of Onset  . Breast cancer Mother   . Stomach cancer Mother   . Diabetes Mellitus II Maternal Aunt      Current Outpatient Medications:  .  acetaminophen (TYLENOL) 500 MG tablet, Take 2 tablets (1,000 mg total) by mouth every 8 (eight) hours as needed. (Patient taking differently: Take 500 mg by mouth every 8 (eight) hours as needed for moderate pain. ), Disp: 30 tablet, Rfl: 0 .  amLODipine (NORVASC) 5 MG tablet, TAKE 1 TABLET BY MOUTH DAILY, Disp: 90 tablet, Rfl: 0 .  aspirin 81 MG tablet, Take 81 mg by mouth daily., Disp: , Rfl:  .  B-D ULTRAFINE III SHORT PEN 31G X 8 MM MISC, USE AS DIRECTED FOUR TIMES DAILY, Disp: 100 each, Rfl: 3 .  calcitRIOL (ROCALTROL) 0.25 MCG capsule, Take 0.25 mcg by mouth See admin instructions. Monday Thursday Sunday,  Pt takes every 3rd day, Disp: , Rfl:  .  carboxymethylcellulose (REFRESH PLUS) 0.5 % SOLN, 1 drop 3 (three) times daily as needed. As needed, Disp: , Rfl:  .  carvedilol (COREG) 25 MG tablet, Take 12.5 mg by mouth daily. , Disp: , Rfl:  .  ciclopirox (LOPROX) 0.77 % cream, Apply to both feet and between toes bid x 4 weeks, Disp: 30 g, Rfl: 1 .  clopidogrel (PLAVIX) 75 MG tablet, TAKE 1 TABLET BY MOUTH DAILY, Disp: 90 tablet, Rfl: 0 .  Ferrous Gluconate (IRON 27 PO), Take by mouth.  Patient takes as needed, Disp: , Rfl:  .  furosemide (LASIX) 40 MG tablet, Take 40 mg by mouth daily., Disp: , Rfl:  .  Ginger, Zingiber officinalis, (GINGER ROOT) 550 MG CAPS, Take by mouth. Patient takes prn, Disp: , Rfl:  .  HYDROcodone-acetaminophen (NORCO/VICODIN) 5-325 MG tablet, Take 1 tablet by mouth 2 (two) times daily as needed for moderate pain., Disp: 30 tablet, Rfl: 0 .  Icosapent Ethyl (VASCEPA) 1 g CAPS, 2 bid (Patient taking differently: 2 per day), Disp: 360 capsule, Rfl: 1 .  Insulin Aspart FlexPen 100 UNIT/ML  SOPN, INJECT 4 UNITS INTO THE SKIN BEFORE BREAKFAST,LUNCH, AND DINNER, Disp: 15 mL, Rfl: 0 .  Insulin Degludec (TRESIBA FLEXTOUCH) 200 UNIT/ML SOPN, Inject 22 Units into the skin daily., Disp: 9 pen, Rfl: 0 .  Insulin Pen Needle 32G X 8 MM MISC, Use as directed, Disp: 100 each, Rfl: 2 .  omeprazole (PRILOSEC) 40 MG capsule, TAKE 1 CAPSULE BY MOUTH EVERY DAY BEFORE A MEAL, Disp: 90 capsule, Rfl: 1 .  sodium bicarbonate 650 MG tablet, Take 1,300 mg by mouth 2 (two) times daily., Disp: , Rfl:  .  TRUEPLUS LANCETS 30G MISC, USE AS DIRECTED TO CHECK BLOOD SUGAR THREE TIMES DAILY, Disp: 300 each, Rfl: 11 .  allopurinol (ZYLOPRIM) 100 MG tablet, TAKE 1 TABLET BY MOUTH TWICE DAILY, Disp: 180 tablet, Rfl: 1 .  glucose blood (TRUE METRIX BLOOD GLUCOSE TEST) test strip, 1 each by Other route as needed for other. Use as directed to check blood sugars, Disp: 150 each, Rfl: 11 .  rosuvastatin (CRESTOR) 20 MG tablet, One tab po qd M-F ONLY, Disp: 75 tablet, Rfl: 1 .  tamsulosin (FLOMAX) 0.4 MG CAPS capsule, TAKE ONE CAPSULE BY MOUTH TWICE DAILY (Patient not taking: Reported on 04/19/2019), Disp: 180 capsule, Rfl: 0   No Known Allergies   Review of Systems  Constitutional: Negative.   HENT: Positive for postnasal drip.   Eyes: Negative for blurred vision.  Respiratory: Negative.  Negative for shortness of breath.   Cardiovascular: Negative.  Negative for chest pain and palpitations.  Gastrointestinal: Negative.   Neurological: Negative.   Psychiatric/Behavioral: Negative.      Today's Vitals   04/19/19 1050  BP: 136/82  Pulse: 82  Temp: 98.9 F (37.2 C)  TempSrc: Oral  Weight: 200 lb 3.2 oz (90.8 kg)  Height: _0  (1.803 m)  PainSc: 0-No pain   Body mass index is 27.92 kg/m.   Objective:  Physical Exam Vitals signs and nursing note reviewed.  Constitutional:      Appearance: Normal appearance.  Cardiovascular:     Rate and Rhythm: Normal rate and regular rhythm.     Heart sounds: Normal  heart sounds.  Pulmonary:     Effort: Pulmonary effort is normal.     Breath sounds: Normal breath sounds.  Skin:    General: Skin is warm.  Neurological:     General: No focal deficit present.     Mental Status: He is alert.  Psychiatric:        Mood and Affect: Mood normal.         Assessment And Plan:     1. Diabetes mellitus with stage 3 chronic kidney disease ([place])  I will check labs as listed below. Importance of dietary and medication compliance was discussed to the patient.  He is encouraged to keep scheduled appointments.   - Hemoglobin A1c - BMP8+EGFR  2. Hypertensive nephropathy  Fair control.  Pt  is aware that optimal bp is less than 130/80.  He is encouraged to avoid adding salt to his foods.   3. Postnasal drip  He will resume loratadine 49m daily. He reports this has worked for him in the past. He will let me know if his sx persist.   4. Encounter for immunization  - Flu vaccine HIGH DOSE PF (Fluzone High dose)     RMaximino Greenland MD    THE PATIENT IS ENCOURAGED TO PRACTICE SOCIAL DISTANCING DUE TO THE COVID-19 PANDEMIC.

## 2019-04-24 ENCOUNTER — Telehealth: Payer: Self-pay

## 2019-04-24 NOTE — Telephone Encounter (Signed)
Left the pt a detailed message at his request and asked that he call back with the responses to the questions.

## 2019-04-24 NOTE — Telephone Encounter (Signed)
Called pt to ask questions Per Dr. Baird Cancer  Message from [name], MD sent at 04/23/2019  9:53 PM EDT ----- Your diabetes has worsened. hba1c is 8.4. What do you think happened? Been eating lots of fresh fruit  Have you been taking your medication? Yes he has been taking his medication    Are you drinking sodas/juices? Occasionally a half of soda once a week Your kidney fxn is stable.

## 2019-04-24 NOTE — Telephone Encounter (Signed)
-----   Message from [name], MD sent at 04/23/2019  9:53 PM EDT ----- Your diabetes has worsened. hba1c is 8.4. What do you think happened? Have you been taking your medication? Are you drinking sodas/juices? Your kidney fxn is stable.

## 2019-04-25 ENCOUNTER — Inpatient Hospital Stay (HOSPITAL_COMMUNITY)
Admission: RE | Admit: 2019-04-25 | Discharge: 2019-04-25 | Disposition: A | Discharge status: Home / Self Care | Payer: Medicare Other | Source: Ambulatory Visit | Attending: Nephrology | Admitting: Nephrology

## 2019-04-26 ENCOUNTER — Ambulatory Visit: Payer: Medicare Other

## 2019-05-02 ENCOUNTER — Other Ambulatory Visit: Payer: Self-pay

## 2019-05-02 ENCOUNTER — Ambulatory Visit (HOSPITAL_COMMUNITY)
Admission: RE | Admit: 2019-05-02 | Discharge: 2019-05-02 | Disposition: A | Discharge status: Home / Self Care | Payer: Medicare Other | Source: Ambulatory Visit | Attending: Nephrology | Admitting: Nephrology

## 2019-05-02 VITALS — BP 142/91 | HR 95 | Temp 97.1°F | Resp 20

## 2019-05-02 DIAGNOSIS — N289 Disorder of kidney and ureter, unspecified: Secondary | ICD-10-CM | POA: Diagnosis not present

## 2019-05-02 LAB — POCT HEMOGLOBIN-HEMACUE: Hemoglobin: 11.2 g/dL — ABNORMAL LOW (ref 13.0–17.0)

## 2019-05-02 MED ORDER — EPOETIN ALFA 20000 UNIT/ML IJ SOLN
INTRAMUSCULAR | Status: AC
  Filled 2019-05-02: qty 1

## 2019-05-02 MED ORDER — EPOETIN ALFA 40000 UNIT/ML IJ SOLN
30000.0000 [IU] | INTRAMUSCULAR | Status: DC
  Administered 2019-05-02: 30000 [IU] via SUBCUTANEOUS

## 2019-05-02 MED ORDER — CLOPIDOGREL BISULFATE 75 MG PO TABS: 75.0000 mg | ORAL_TABLET | Freq: Every day | ORAL | 2 refills | Status: DC

## 2019-05-02 MED ORDER — EPOETIN ALFA 10000 UNIT/ML IJ SOLN
INTRAMUSCULAR | Status: AC
  Filled 2019-05-02: qty 1

## 2019-05-03 MED FILL — Epoetin Alfa Inj 10000 Unit/ML: INTRAMUSCULAR | Qty: 1 | Status: AC

## 2019-05-03 MED FILL — Epoetin Alfa Inj 20000 Unit/ML: INTRAMUSCULAR | Qty: 1 | Status: AC

## 2019-05-10 ENCOUNTER — Telehealth: Payer: Self-pay | Admitting: Internal Medicine

## 2019-05-10 NOTE — Chronic Care Management (AMB) (Signed)
Chronic Care Management   Note  05/10/2019 Name: Alec Solis MRN: [id number] DOB: 19-Nov-1932  Alec Solis is a 85 y.o. year old male who is a primary care patient of [name], MD. I reached out to Alec Solis by phone today in response to a referral sent by Alec Solis's health plan.    Alec Solis was given information about Chronic Care Management services today including:  1. CCM service includes personalized support from designated clinical staff supervised by his physician, including individualized plan of care and coordination with other care providers 2. 24/7 contact phone numbers for assistance for urgent and routine care needs. 3. Service will only be billed when office clinical staff spend 20 minutes or more in a month to coordinate care. 4. Only one practitioner may furnish and bill the service in a calendar month. 5. The patient may stop CCM services at any time (effective at the end of the month) by phone call to the office staff. 6. The patient will be responsible for cost sharing (co-pay) of up to 20% of the service fee (after annual deductible is met).  Patient agreed to services and verbal consent obtained.   Follow up plan: Telephone appointment with CCM team member scheduled for: 05/17/2019  [place]  [email or web address]   ??[phone]

## 2019-05-16 ENCOUNTER — Encounter (HOSPITAL_COMMUNITY): Payer: Medicare Other

## 2019-05-17 ENCOUNTER — Telehealth: Payer: Medicare Other

## 2019-05-23 ENCOUNTER — Other Ambulatory Visit: Payer: Self-pay

## 2019-05-23 ENCOUNTER — Ambulatory Visit (INDEPENDENT_AMBULATORY_CARE_PROVIDER_SITE_OTHER): Payer: Medicare Other

## 2019-05-23 ENCOUNTER — Ambulatory Visit (HOSPITAL_COMMUNITY)
Admission: RE | Admit: 2019-05-23 | Discharge: 2019-05-23 | Disposition: A | Discharge status: Home / Self Care | Payer: Medicare Other | Source: Ambulatory Visit | Attending: Nephrology | Admitting: Nephrology

## 2019-05-23 VITALS — BP 139/88 | HR 73 | Ht 72.0 in | Wt 212.0 lb

## 2019-05-23 VITALS — BP 129/90 | HR 79 | Temp 96.7°F | Resp 20

## 2019-05-23 DIAGNOSIS — N289 Disorder of kidney and ureter, unspecified: Secondary | ICD-10-CM

## 2019-05-23 DIAGNOSIS — D631 Anemia in chronic kidney disease: Secondary | ICD-10-CM | POA: Insufficient documentation

## 2019-05-23 DIAGNOSIS — N183 Chronic kidney disease, stage 3 (moderate): Secondary | ICD-10-CM | POA: Diagnosis not present

## 2019-05-23 DIAGNOSIS — Z0000 Encounter for general adult medical examination without abnormal findings: Secondary | ICD-10-CM

## 2019-05-23 LAB — POCT HEMOGLOBIN-HEMACUE: Hemoglobin: 10.1 g/dL — ABNORMAL LOW (ref 13.0–17.0)

## 2019-05-23 LAB — RENAL FUNCTION PANEL
Albumin: 3.6 g/dL (ref 3.5–5.0)
Anion gap: 11 (ref 5–15)
BUN: 20 mg/dL (ref 8–23)
CO2: 22 mmol/L (ref 22–32)
Calcium: 9.3 mg/dL (ref 8.9–10.3)
Chloride: 105 mmol/L (ref 98–111)
Creatinine, Ser: 1.9 mg/dL — ABNORMAL HIGH (ref 0.61–1.24)
GFR calc Af Amer: 37 mL/min — ABNORMAL LOW (ref >60)
GFR calc non Af Amer: 32 mL/min — ABNORMAL LOW (ref >60)
Glucose, Bld: 229 mg/dL — ABNORMAL HIGH (ref 70–99)
Phosphorus: 2.5 mg/dL (ref 2.5–4.6)
Potassium: 4.1 mmol/L (ref 3.5–5.1)
Sodium: 138 mmol/L (ref 135–145)

## 2019-05-23 LAB — IRON AND TIBC
Iron: 88 ug/dL (ref 45–182)
Saturation Ratios: 28 % (ref 17.9–39.5)
TIBC: 309 ug/dL (ref 250–450)
UIBC: 221 ug/dL

## 2019-05-23 LAB — FERRITIN: Ferritin: 1687 ng/mL — ABNORMAL HIGH (ref 24–336)

## 2019-05-23 MED ORDER — EPOETIN ALFA 40000 UNIT/ML IJ SOLN: 30000.0000 [IU] | INTRAMUSCULAR | Status: DC

## 2019-05-23 MED ORDER — EPOETIN ALFA 20000 UNIT/ML IJ SOLN
INTRAMUSCULAR | Status: AC
  Administered 2019-05-23: 20000 [IU]
  Filled 2019-05-23: qty 1

## 2019-05-23 MED ORDER — EPOETIN ALFA 10000 UNIT/ML IJ SOLN
INTRAMUSCULAR | Status: AC
  Administered 2019-05-23: 10000 [IU]
  Filled 2019-05-23: qty 1

## 2019-05-23 NOTE — Patient Instructions (Signed)
Alec Solis , Thank you for taking time to come for your Medicare Wellness Visit. I appreciate your ongoing commitment to your health goals. Please review the following plan we discussed and let me know if I can assist you in the future.   Screening recommendations/referrals: Colonoscopy: 05/2017 Recommended yearly ophthalmology/optometry visit for glaucoma screening and checkup Recommended yearly dental visit for hygiene and checkup  Vaccinations: Influenza vaccine: 03/2019 Pneumococcal vaccine: states had Tdap vaccine: 04/2013 Shingles vaccine: discussed    Advanced directives: Advance directive discussed with you today. Even though you declined this today please call our office should you change your mind and we can give you the proper paperwork for you to fill out.   Conditions/risks identified: overweight  Next appointment: 06/01/2019 at 11:45  Preventive Care 13 Years and Older, Male Preventive care refers to lifestyle choices and visits with your health care provider that can promote health and wellness. What does preventive care include?  A yearly physical exam. This is also called an annual well check.  Dental exams once or twice a year.  Routine eye exams. Ask your health care provider how often you should have your eyes checked.  Personal lifestyle choices, including:  Daily care of your teeth and gums.  Regular physical activity.  Eating a healthy diet.  Avoiding tobacco and drug use.  Limiting alcohol use.  Practicing safe sex.  Taking low doses of aspirin every day.  Taking vitamin and mineral supplements as recommended by your health care provider. What happens during an annual well check? The services and screenings done by your health care provider during your annual well check will depend on your age, overall health, lifestyle risk factors, and family history of disease. Counseling  Your health care provider may ask you questions about your:  Alcohol  use.  Tobacco use.  Drug use.  Emotional well-being.  Home and relationship well-being.  Sexual activity.  Eating habits.  History of falls.  Memory and ability to understand (cognition).  Work and work Statistician. Screening  You may have the following tests or measurements:  Height, weight, and BMI.  Blood pressure.  Lipid and cholesterol levels. These may be checked every 5 years, or more frequently if you are over 82 years old.  Skin check.  Lung cancer screening. You may have this screening every year starting at age 61 if you have a 30-pack-year history of smoking and currently smoke or have quit within the past 15 years.  Fecal occult blood test (FOBT) of the stool. You may have this test every year starting at age 32.  Flexible sigmoidoscopy or colonoscopy. You may have a sigmoidoscopy every 5 years or a colonoscopy every 10 years starting at age 78.  Prostate cancer screening. Recommendations will vary depending on your family history and other risks.  Hepatitis C blood test.  Hepatitis B blood test.  Sexually transmitted disease (STD) testing.  Diabetes screening. This is done by checking your blood sugar (glucose) after you have not eaten for a while (fasting). You may have this done every 1-3 years.  Abdominal aortic aneurysm (AAA) screening. You may need this if you are a current or former smoker.  Osteoporosis. You may be screened starting at age 66 if you are at high risk. Talk with your health care provider about your test results, treatment options, and if necessary, the need for more tests. Vaccines  Your health care provider may recommend certain vaccines, such as:  Influenza vaccine. This is recommended every  year.  Tetanus, diphtheria, and acellular pertussis (Tdap, Td) vaccine. You may need a Td booster every 10 years.  Zoster vaccine. You may need this after age 42.  Pneumococcal 13-valent conjugate (PCV13) vaccine. One dose is  recommended after age 19.  Pneumococcal polysaccharide (PPSV23) vaccine. One dose is recommended after age 72. Talk to your health care provider about which screenings and vaccines you need and how often you need them. This information is not intended to replace advice given to you by your health care provider. Make sure you discuss any questions you have with your health care provider. Document Released: 09/06/2015 Document Revised: 04/29/2016 Document Reviewed: 06/11/2015 Elsevier Interactive Patient Education  2017 [place] Prevention in the Home Falls can cause injuries. They can happen to people of all ages. There are many things you can do to make your home safe and to help prevent falls. What can I do on the outside of my home?  Regularly fix the edges of walkways and driveways and fix any cracks.  Remove anything that might make you trip as you walk through a door, such as a raised step or threshold.  Trim any bushes or trees on the path to your home.  Use bright outdoor lighting.  Clear any walking paths of anything that might make someone trip, such as rocks or tools.  Regularly check to see if handrails are loose or broken. Make sure that both sides of any steps have handrails.  Any raised decks and porches should have guardrails on the edges.  Have any leaves, snow, or ice cleared regularly.  Use sand or salt on walking paths during winter.  Clean up any spills in your garage right away. This includes oil or grease spills. What can I do in the bathroom?  Use night lights.  Install grab bars by the toilet and in the tub and shower. Do not use towel bars as grab bars.  Use non-skid mats or decals in the tub or shower.  If you need to sit down in the shower, use a plastic, non-slip stool.  Keep the floor dry. Clean up any water that spills on the floor as soon as it happens.  Remove soap buildup in the tub or shower regularly.  Attach bath mats  securely with double-sided non-slip rug tape.  Do not have throw rugs and other things on the floor that can make you trip. What can I do in the bedroom?  Use night lights.  Make sure that you have a light by your bed that is easy to reach.  Do not use any sheets or blankets that are too big for your bed. They should not hang down onto the floor.  Have a firm chair that has side arms. You can use this for support while you get dressed.  Do not have throw rugs and other things on the floor that can make you trip. What can I do in the kitchen?  Clean up any spills right away.  Avoid walking on wet floors.  Keep items that you use a lot in easy-to-reach places.  If you need to reach something above you, use a strong step stool that has a grab bar.  Keep electrical cords out of the way.  Do not use floor polish or wax that makes floors slippery. If you must use wax, use non-skid floor wax.  Do not have throw rugs and other things on the floor that can make you trip. What can  I do with my stairs?  Do not leave any items on the stairs.  Make sure that there are handrails on both sides of the stairs and use them. Fix handrails that are broken or loose. Make sure that handrails are as long as the stairways.  Check any carpeting to make sure that it is firmly attached to the stairs. Fix any carpet that is loose or worn.  Avoid having throw rugs at the top or bottom of the stairs. If you do have throw rugs, attach them to the floor with carpet tape.  Make sure that you have a light switch at the top of the stairs and the bottom of the stairs. If you do not have them, ask someone to add them for you. What else can I do to help prevent falls?  Wear shoes that:  Do not have high heels.  Have rubber bottoms.  Are comfortable and fit you well.  Are closed at the toe. Do not wear sandals.  If you use a stepladder:  Make sure that it is fully opened. Do not climb a closed  stepladder.  Make sure that both sides of the stepladder are locked into place.  Ask someone to hold it for you, if possible.  Clearly mark and make sure that you can see:  Any grab bars or handrails.  First and last steps.  Where the edge of each step is.  Use tools that help you move around (mobility aids) if they are needed. These include:  Canes.  Walkers.  Scooters.  Crutches.  Turn on the lights when you go into a dark area. Replace any light bulbs as soon as they burn out.  Set up your furniture so you have a clear path. Avoid moving your furniture around.  If any of your floors are uneven, fix them.  If there are any pets around you, be aware of where they are.  Review your medicines with your doctor. Some medicines can make you feel dizzy. This can increase your chance of falling. Ask your doctor what other things that you can do to help prevent falls. This information is not intended to replace advice given to you by your health care provider. Make sure you discuss any questions you have with your health care provider. Document Released: 06/06/2009 Document Revised: 01/16/2016 Document Reviewed: 09/14/2014 Elsevier Interactive Patient Education  2017 [employer].

## 2019-05-23 NOTE — Progress Notes (Signed)
Subjective:   Alec Solis is a 85 y.o. male who presents for Medicare Annual/Subsequent preventive examination.  This visit type was conducted due to national recommendations for restrictions regarding the COVID-19 Pandemic (e.g. social distancing). This format is felt to be most appropriate for this patient at this time. All issues noted in this document were discussed and addressed. No physical exam was performed (except for noted visual exam findings with Video Visits). This patient, Alec Solis, has given permission to perform this visit via telephone. Attempted to do a video visit. Patient's phone would not allow camera to be enabled.  Vital signs may be absent or patient reported.  Patient location:  At home  Nurse location:  [place] office     Review of Systems:  n/a Cardiac Risk Factors include: advanced age (>41men, >72 women);diabetes mellitus;hypertension;male gender;sedentary lifestyle     Objective:    Vitals: BP 139/88 Comment: per patient  Pulse 73 Comment: per patient  Ht 6' (1.829 m) Comment: per patient  Wt 212 lb (96.2 kg) Comment: per patient  BMI 28.75 kg/m   Body mass index is 28.75 kg/m.  Advanced Directives 05/23/2019 05/28/2017 05/27/2017 06/11/2016 06/02/2016  Does Patient Have a Medical Advance Directive? No No No No No  Copy of Healthcare Power of Attorney in Chart? - - No - copy requested - -  Would patient like information on creating a medical advance directive? - No - Patient declined - No - patient declined information No - patient declined information    Tobacco Social History   Tobacco Use  Smoking Status Former Smoker  . Packs/day: 0.25  . Years: 50.00  . Pack years: 12.50  . Types: Cigarettes  Smokeless Tobacco Never Used     Counseling given: Not Answered   Clinical Intake:  Pre-visit preparation completed: Yes  Pain : No/denies pain     Nutritional Status: BMI 25 -29 Overweight Nutritional Risks: None Diabetes: Yes  CBG done?: No Did pt. bring in CBG monitor from home?: No  How often do you need to have someone help you when you read instructions, pamphlets, or other written materials from your doctor or pharmacy?: 1 - Never What is the last grade level you completed in school?: 12th grade  Interpreter Needed?: No  Information entered by :: Alec Solis  Past Medical History:  Diagnosis Date  . Anemia   . Anxiety   . Arthritis   . Cataract   . Chronic kidney disease   . Depression   . Diabetes mellitus without complication ([place])    type II   . GERD (gastroesophageal reflux disease)   . Gout   . Heart murmur   . Hypertension   . Myocardial infarction ([place])    minor Mi- years ago -   . Varicose veins of left lower extremity    Past Surgical History:  Procedure Laterality Date  . BUNIONECTOMY    . cataract    . COLONOSCOPY WITH PROPOFOL N/A 05/28/2017   Procedure: COLONOSCOPY WITH PROPOFOL;  Surgeon: Carol Ada, MD;  Location: [facility];  Service: Endoscopy;  Laterality: N/A;  . ESOPHAGOGASTRODUODENOSCOPY (EGD) WITH PROPOFOL N/A 05/28/2017   Procedure: ESOPHAGOGASTRODUODENOSCOPY (EGD) WITH PROPOFOL;  Surgeon: Carol Ada, MD;  Location: [facility];  Service: Endoscopy;  Laterality: N/A;   Family History  Problem Relation Age of Onset  . Breast cancer Mother   . Stomach cancer Mother   . Diabetes Mellitus II Maternal Aunt    Social History  Socioeconomic History  . Marital status: Married    Spouse name: Not on file  . Number of children: Not on file  . Years of education: Not on file  . Highest education level: Not on file  Occupational History  . Occupation: retired  Scientific laboratory technician  . Financial resource strain: Not hard at all  . Food insecurity    Worry: Never true    Inability: Never true  . Transportation needs    Medical: No    Non-medical: No  Tobacco Use  . Smoking status: Former Smoker    Packs/day: 0.25    Years: 50.00    Pack years: 12.50    Types:  Cigarettes  . Smokeless tobacco: Never Used  Substance and Sexual Activity  . Alcohol use: Yes    Comment: occassional wine beer  . Drug use: No  . Sexual activity: Not Currently  Lifestyle  . Physical activity    Days per week: 0 days    Minutes per session: 0 min  . Stress: Only a little  Relationships  . Social Herbalist on phone: Not on file    Gets together: Not on file    Attends religious service: Not on file    Active member of club or organization: Not on file    Attends meetings of clubs or organizations: Not on file    Relationship status: Not on file  Other Topics Concern  . Not on file  Social History Narrative  . Not on file    Outpatient Encounter Medications as of 05/23/2019  Medication Sig  . acetaminophen (TYLENOL) 500 MG tablet Take 2 tablets (1,000 mg total) by mouth every 8 (eight) hours as needed. (Patient taking differently: Take 500 mg by mouth every 8 (eight) hours as needed for moderate pain. )  . allopurinol (ZYLOPRIM) 100 MG tablet TAKE 1 TABLET BY MOUTH TWICE DAILY  . amLODipine (NORVASC) 5 MG tablet TAKE 1 TABLET BY MOUTH DAILY  . aspirin 81 MG tablet Take 81 mg by mouth daily.  . B-D ULTRAFINE III SHORT PEN 31G X 8 MM MISC USE AS DIRECTED FOUR TIMES DAILY  . calcitRIOL (ROCALTROL) 0.25 MCG capsule Take 0.25 mcg by mouth See admin instructions. Monday Thursday Sunday,  Pt takes every 3rd day  . carboxymethylcellulose (REFRESH PLUS) 0.5 % SOLN 1 drop 3 (three) times daily as needed. As needed  . carvedilol (COREG) 25 MG tablet Take 12.5 mg by mouth daily.   . ciclopirox (LOPROX) 0.77 % cream Apply to both feet and between toes bid x 4 weeks  . clopidogrel (PLAVIX) 75 MG tablet Take 1 tablet (75 mg total) by mouth daily.  . Ferrous Gluconate (IRON 27 PO) Take by mouth. Patient takes as needed  . furosemide (LASIX) 40 MG tablet Take 40 mg by mouth daily.  . Ginger, Zingiber officinalis, (GINGER ROOT) 550 MG CAPS Take by mouth. Patient takes  prn  . glucose blood (TRUE METRIX BLOOD GLUCOSE TEST) test strip 1 each by Other route as needed for other. Use as directed to check blood sugars  . HYDROcodone-acetaminophen (NORCO/VICODIN) 5-325 MG tablet Take 1 tablet by mouth 2 (two) times daily as needed for moderate pain.  Vanessa Kick Ethyl (VASCEPA) 1 g CAPS 2 bid (Patient taking differently: 2 per day)  . Insulin Aspart FlexPen 100 UNIT/ML SOPN INJECT 4 UNITS INTO THE SKIN BEFORE BREAKFAST,LUNCH, AND DINNER  . Insulin Degludec (TRESIBA FLEXTOUCH) 200 UNIT/ML SOPN Inject 22 Units into  the skin daily.  . Insulin Pen Needle 32G X 8 MM MISC Use as directed  . omeprazole (PRILOSEC) 40 MG capsule TAKE 1 CAPSULE BY MOUTH EVERY DAY BEFORE A MEAL  . rosuvastatin (CRESTOR) 20 MG tablet One tab po qd M-F ONLY  . sodium bicarbonate 650 MG tablet Take 1,300 mg by mouth 2 (two) times daily.  . TRUEPLUS LANCETS 30G MISC USE AS DIRECTED TO CHECK BLOOD SUGAR THREE TIMES DAILY  . tamsulosin (FLOMAX) 0.4 MG CAPS capsule TAKE ONE CAPSULE BY MOUTH TWICE DAILY (Patient not taking: Reported on 04/19/2019)   Facility-Administered Encounter Medications as of 05/23/2019  Medication  . [COMPLETED] epoetin alfa (EPOGEN) 10000 UNIT/ML injection  . [COMPLETED] epoetin alfa (EPOGEN) 20000 UNIT/ML injection  . epoetin alfa (EPOGEN) injection 30,000 Units    Activities of Daily Living In your present state of health, do you have any difficulty performing the following activities: 05/23/2019 04/19/2019  Hearing? N N  Vision? Y N  Comment due to eye drops vision gets blurry sometimes -  Difficulty concentrating or making decisions? Y Y  Comment sometimes -  Walking or climbing stairs? N N  Dressing or bathing? N N  Doing errands, shopping? N N  Preparing Food and eating ? N -  Using the Toilet? N -  In the past six months, have you accidently leaked urine? Y -  Do you have problems with loss of bowel control? Y -  Comment occassionally -  Managing your  Medications? N -  Managing your Finances? N -  Housekeeping or managing your Housekeeping? N -  Some recent data might be hidden    Patient Care Team: [name], MD as PCP - General (Internal Medicine) Blanca Friend Royce Macadamia, [facility] as [facility] (Pharmacist)   Assessment:   This is a routine wellness examination for Alec Solis.  Exercise Activities and Dietary recommendations Current Exercise Habits: The patient does not participate in regular exercise at present  Goals    . Patient Stated     05/23/2019, staying alive       Fall Risk Fall Risk  05/23/2019 01/18/2019 01/17/2019 10/18/2018 06/14/2018  Falls in the past year? 0 1 1 0 No  Comment - - - - -  Number falls in past yr: - - 0 - -  Comment - - - - -  Injury with Fall? - - 1 - -  Risk for fall due to : Medication side effect - - - -  Follow up Falls evaluation completed;Falls prevention discussed - - - -   Is the patient's home free of loose throw rugs in walkways, pet beds, electrical cords, etc?   yes      Grab bars in the bathroom? no      Handrails on the stairs?   yes      Adequate lighting?   yes  Timed Get Up and Go Performed: n/a  Depression Screen PHQ 2/9 Scores 05/23/2019 01/17/2019 10/18/2018 06/14/2018  PHQ - 2 Score 1 0 0 0  PHQ- 9 Score 1 - - -    Cognitive Function     6CIT Screen 05/23/2019  What Year? 0 points  What month? 0 points  What time? 0 points  Count back from 20 0 points  Months in reverse 2 points  Repeat phrase 0 points  Total Score 2    Immunization History  Administered Date(s) Administered  . Influenza, High Dose Seasonal PF 04/19/2019  . Influenza-Unspecified 05/24/2013, 05/31/2018  Qualifies for Shingles Vaccine? yes  Screening Tests Health Maintenance  Topic Date Due  . FOOT EXAM  05/30/1945  . PNA vac Low Risk Adult (1 of 2 - PCV13) 05/30/2000  . HEMOGLOBIN A1C  10/20/2019  . URINE MICROALBUMIN  01/17/2020  . OPHTHALMOLOGY EXAM   03/07/2020  . TETANUS/TDAP  05/23/2023  . INFLUENZA VACCINE  Completed   Cancer Screenings: Lung: Low Dose CT Chest recommended if Age 74-80 years, 30 pack-year currently smoking OR have quit w/in 15years. Patient does not qualify. Colorectal: up to date  Additional Screenings:  Hepatitis C Screening:n/a      Plan:    Patient goal is just to stay alive.  I have personally reviewed and noted the following in the patient's chart:   . Medical and social history . Use of alcohol, tobacco or illicit drugs  . Current medications and supplements . Functional ability and status . Nutritional status . Physical activity . Advanced directives . List of other physicians . Hospitalizations, surgeries, and ER visits in previous 12 months . Vitals . Screenings to include cognitive, depression, and falls . Referrals and appointments  In addition, I have reviewed and discussed with patient certain preventive protocols, quality metrics, and best practice recommendations. A written personalized care plan for preventive services as well as general preventive health recommendations were provided to patient.     Kellie Simmering, Solis  [id number]

## 2019-05-24 LAB — PTH, INTACT AND CALCIUM
Calcium, Total (PTH): 9.4 mg/dL (ref 8.6–10.2)
PTH: 51 pg/mL (ref 15–65)

## 2019-05-26 ENCOUNTER — Telehealth: Payer: Medicare Other

## 2019-06-01 ENCOUNTER — Other Ambulatory Visit: Payer: Self-pay

## 2019-06-01 ENCOUNTER — Ambulatory Visit (INDEPENDENT_AMBULATORY_CARE_PROVIDER_SITE_OTHER): Payer: Medicare Other | Admitting: Internal Medicine

## 2019-06-01 ENCOUNTER — Encounter: Payer: Self-pay | Admitting: Internal Medicine

## 2019-06-01 ENCOUNTER — Ambulatory Visit: Payer: Medicare Other

## 2019-06-01 VITALS — BP 130/62 | HR 40 | Temp 98.4°F | Ht 72.0 in | Wt 203.6 lb

## 2019-06-01 DIAGNOSIS — Z6827 Body mass index (BMI) 27.0-27.9, adult: Secondary | ICD-10-CM | POA: Diagnosis not present

## 2019-06-01 DIAGNOSIS — Z79899 Other long term (current) drug therapy: Secondary | ICD-10-CM

## 2019-06-01 DIAGNOSIS — E1122 Type 2 diabetes mellitus with diabetic chronic kidney disease: Secondary | ICD-10-CM

## 2019-06-01 DIAGNOSIS — Z712 Person consulting for explanation of examination or test findings: Secondary | ICD-10-CM

## 2019-06-01 DIAGNOSIS — E663 Overweight: Secondary | ICD-10-CM | POA: Diagnosis not present

## 2019-06-01 DIAGNOSIS — N183 Chronic kidney disease, stage 3 unspecified: Secondary | ICD-10-CM | POA: Diagnosis not present

## 2019-06-01 DIAGNOSIS — E7800 Pure hypercholesterolemia, unspecified: Secondary | ICD-10-CM | POA: Diagnosis not present

## 2019-06-01 MED ORDER — OMEPRAZOLE 40 MG PO CPDR: DELAYED_RELEASE_CAPSULE | ORAL | 1 refills | Status: DC

## 2019-06-01 NOTE — Patient Instructions (Signed)
Preventing Influenza, Adult Influenza, more commonly known as "the flu," is a viral infection that mainly affects the respiratory tract. The respiratory tract includes structures that help you breathe, such as the lungs, nose, and throat. The flu causes many common cold symptoms, as well as a high fever and body aches. The flu spreads easily from person to person (is contagious). The flu is most common from December through March. This is called flu season.You can catch the flu virus by:  Breathing in droplets from an infected person's cough or sneeze.  Touching something that was recently contaminated with the virus and then touching your mouth, nose, or eyes. What can I do to lower my risk?        You can decrease your risk of getting the flu by:  Getting a flu shot (influenza vaccination) every year. This is the best way to prevent the flu. A flu shot is recommended for everyone age 6 months and older. ? It is best to get a flu shot in the fall, as soon as it is available. Getting a flu shot during winter or spring instead is still a good idea. Flu season can last into early spring. ? Preventing the flu through vaccination requires getting a new flu shot every year. This is because the flu virus changes slightly (mutates) from one year to the next. Even if a flu shot does not completely protect you from all flu virus mutations, it can reduce the severity of your illness and prevent dangerous complications of the flu. ? If you are pregnant, you can and should get a flu shot. ? If you have had a reaction to the shot in the past or if you are allergic to eggs, check with your health care provider before getting a flu shot. ? Sometimes the vaccine is available as a nasal spray. In some years, the nasal spray has not been as effective against the flu virus. Check with your health care provider if you have questions about this.  Practicing good health habits. This is especially important during  flu season. ? Avoid contact with people who are sick with flu or cold symptoms. ? Wash your hands with soap and water often. If soap and water are not available, use alcohol-based hand sanitizer. ? Avoid touching your hands to your face, especially when you have not washed your hands recently. ? Use a disinfectant to clean surfaces at home and at work that may be contaminated with the flu virus. ? Keep your body's disease-fighting system (immune system) in good shape by eating a healthy diet, drinking plenty of fluids, getting enough sleep, and exercising regularly. If you do get the flu, avoid spreading it to others by:  Staying home until your symptoms have been gone for at least one day.  Covering your mouth and nose when you cough or sneeze.  Avoiding close contact with others, especially babies and elderly people. Why are these changes important? Getting a flu shot and practicing good health habits protects you as well as other people. If you get the flu, your friends, family, and co-workers are also at risk of getting it, because it spreads so easily to others. Each year, about 2 out of every 10 people get the flu. Having the flu can lead to complications, such as pneumonia, ear infection, and sinus infection. The flu also can be deadly, especially for babies, people older than age 65, and people who have serious long-term diseases. How is this treated? Most   people recover from the flu by resting at home and drinking plenty of fluids. However, a prescription antiviral medicine may reduce your flu symptoms and may make your flu go away sooner. This medicine must be started within a few days of getting flu symptoms. You can talk with your health care provider about whether you need an antiviral medicine. Antiviral medicine may be prescribed for people who are at risk for more serious flu symptoms. This includes people who:  Are older than age 65.  Are pregnant.  Have a condition that  makes the flu worse or more dangerous. Where to find more information  Centers for Disease Control and Prevention: www.cdc.gov/flu/index.htm  Flu.gov: www.flu.gov/prevention-vaccination  American Academy of Family Physicians: familydoctor.org/familydoctor/en/kids/vaccines/preventing-the-flu.html Contact a health care provider if:  You have influenza and you develop new symptoms.  You have: ? Chest pain. ? Diarrhea. ? A fever.  Your cough gets worse, or you produce more mucus. Summary  The best way to prevent the flu is to get a flu shot every year in the fall.  Even if you get the flu after you have received the yearly vaccine, your flu may be milder and go away sooner because of your flu shot.  If you get the flu, antiviral medicines that are started with a few days of symptoms may reduce your flu symptoms and may make your flu go away sooner.  You can also help prevent the flu by practicing good health habits. This information is not intended to replace advice given to you by your health care provider. Make sure you discuss any questions you have with your health care provider. Document Released: 08/25/2015 Document Revised: 07/23/2017 Document Reviewed: 04/18/2016 Elsevier Patient Education  2020 Elsevier Inc.  

## 2019-06-02 LAB — HEMOGLOBIN A1C
Est. average glucose Bld gHb Est-mCnc: 186 mg/dL
Hgb A1c MFr Bld: 8.1 % — ABNORMAL HIGH (ref 4.8–5.6)

## 2019-06-02 LAB — HEPATIC FUNCTION PANEL
ALT: 12 IU/L (ref 0–44)
AST: 15 IU/L (ref 0–40)
Albumin: 4.4 g/dL (ref 3.6–4.6)
Alkaline Phosphatase: 156 IU/L — ABNORMAL HIGH (ref 39–117)
Bilirubin Total: 0.3 mg/dL (ref 0.0–1.2)
Bilirubin, Direct: 0.1 mg/dL (ref 0.00–0.40)
Total Protein: 7.5 g/dL (ref 6.0–8.5)

## 2019-06-02 LAB — LIPID PANEL
Chol/HDL Ratio: 4.3 ratio (ref 0.0–5.0)
Cholesterol, Total: 103 mg/dL (ref 100–199)
HDL: 24 mg/dL — ABNORMAL LOW (ref >39)
LDL Chol Calc (NIH): 38 mg/dL (ref 0–99)
Triglycerides: 263 mg/dL — ABNORMAL HIGH (ref 0–149)
VLDL Cholesterol Cal: 41 mg/dL — ABNORMAL HIGH (ref 5–40)

## 2019-06-02 LAB — VITAMIN B12: Vitamin B-12: 685 pg/mL (ref 232–1245)

## 2019-06-02 NOTE — Progress Notes (Signed)
Subjective:     Patient ID: Alec Solis , male    DOB: 01/24/1933 , 85 y.o.   MRN: [id number]   Chief Complaint  Patient presents with  . Hyperlipidemia    HPI  He is here today for a cholesterol check. He was prescribed rosuvastatin 20mg  at his last visit. He has not had any issues with the medication.   Hyperlipidemia This is a chronic problem. The problem is uncontrolled. Exacerbating diseases include chronic renal disease and diabetes. Pertinent negatives include no leg pain or shortness of breath. Current antihyperlipidemic treatment includes statins. Compliance problems include adherence to exercise and adherence to diet.  Risk factors for coronary artery disease include diabetes mellitus, male sex, dyslipidemia, a sedentary lifestyle and hypertension.     Past Medical History:  Diagnosis Date  . Anemia   . Anxiety   . Arthritis   . Cataract   . Chronic kidney disease   . Depression   . Diabetes mellitus without complication ([place])    type II   . GERD (gastroesophageal reflux disease)   . Gout   . Heart murmur   . Hypertension   . Myocardial infarction ([place])    minor Mi- years ago -   . Varicose veins of left lower extremity      Family History  Problem Relation Age of Onset  . Breast cancer Mother   . Stomach cancer Mother   . Diabetes Mellitus II Maternal Aunt      Current Outpatient Medications:  .  acetaminophen (TYLENOL) 500 MG tablet, Take 2 tablets (1,000 mg total) by mouth every 8 (eight) hours as needed. (Patient taking differently: Take 500 mg by mouth every 8 (eight) hours as needed for moderate pain. ), Disp: 30 tablet, Rfl: 0 .  allopurinol (ZYLOPRIM) 100 MG tablet, TAKE 1 TABLET BY MOUTH TWICE DAILY, Disp: 180 tablet, Rfl: 1 .  amLODipine (NORVASC) 5 MG tablet, TAKE 1 TABLET BY MOUTH DAILY, Disp: 90 tablet, Rfl: 0 .  aspirin 81 MG tablet, Take 81 mg by mouth daily., Disp: , Rfl:  .  B-D ULTRAFINE III SHORT PEN 31G X 8 MM MISC, USE AS DIRECTED FOUR  TIMES DAILY, Disp: 100 each, Rfl: 3 .  calcitRIOL (ROCALTROL) 0.25 MCG capsule, Take 0.25 mcg by mouth See admin instructions. Monday Thursday Sunday,  Pt takes every 3rd day, Disp: , Rfl:  .  carboxymethylcellulose (REFRESH PLUS) 0.5 % SOLN, 1 drop 3 (three) times daily as needed. As needed, Disp: , Rfl:  .  carvedilol (COREG) 25 MG tablet, Take 12.5 mg by mouth daily. , Disp: , Rfl:  .  ciclopirox (LOPROX) 0.77 % cream, Apply to both feet and between toes bid x 4 weeks, Disp: 30 g, Rfl: 1 .  clopidogrel (PLAVIX) 75 MG tablet, Take 1 tablet (75 mg total) by mouth daily., Disp: 90 tablet, Rfl: 2 .  Ferrous Gluconate (IRON 27 PO), Take by mouth. Patient takes as needed, Disp: , Rfl:  .  furosemide (LASIX) 40 MG tablet, Take 40 mg by mouth daily., Disp: , Rfl:  .  Ginger, Zingiber officinalis, (GINGER ROOT) 550 MG CAPS, Take by mouth. Patient takes prn, Disp: , Rfl:  .  HYDROcodone-acetaminophen (NORCO/VICODIN) 5-325 MG tablet, Take 1 tablet by mouth 2 (two) times daily as needed for moderate pain., Disp: 30 tablet, Rfl: 0 .  Icosapent Ethyl (VASCEPA) 1 g CAPS, 2 bid (Patient taking differently: 2 per day), Disp: 360 capsule, Rfl: 1 .  Insulin Aspart FlexPen  100 UNIT/ML SOPN, INJECT 4 UNITS INTO THE SKIN BEFORE BREAKFAST,LUNCH, AND DINNER, Disp: 15 mL, Rfl: 0 .  Insulin Degludec (TRESIBA FLEXTOUCH) 200 UNIT/ML SOPN, Inject 22 Units into the skin daily., Disp: 9 pen, Rfl: 0 .  Insulin Pen Needle 32G X 8 MM MISC, Use as directed, Disp: 100 each, Rfl: 2 .  rosuvastatin (CRESTOR) 20 MG tablet, One tab po qd M-F ONLY, Disp: 75 tablet, Rfl: 1 .  sodium bicarbonate 650 MG tablet, Take 1,300 mg by mouth 2 (two) times daily., Disp: , Rfl:  .  tamsulosin (FLOMAX) 0.4 MG CAPS capsule, TAKE ONE CAPSULE BY MOUTH TWICE DAILY, Disp: 180 capsule, Rfl: 0 .  TRUEPLUS LANCETS 30G MISC, USE AS DIRECTED TO CHECK BLOOD SUGAR THREE TIMES DAILY, Disp: 300 each, Rfl: 11 .  glucose blood (TRUE METRIX BLOOD GLUCOSE TEST) test  strip, 1 each by Other route as needed for other. Use as directed to check blood sugars, Disp: 150 each, Rfl: 11 .  omeprazole (PRILOSEC) 40 MG capsule, TAKE 1 CAPSULE BY MOUTH EVERY DAY BEFORE A MEAL, Disp: 90 capsule, Rfl: 1   No Known Allergies   Review of Systems  Constitutional: Negative.   Respiratory: Negative.  Negative for shortness of breath.   Cardiovascular: Negative.   Gastrointestinal: Negative.   Neurological: Negative.   Psychiatric/Behavioral: Negative.      Today's Vitals   06/01/19 1156  BP: 130/62  Pulse: (!) 40  Temp: 98.4 F (36.9 C)  TempSrc: Oral  SpO2: 97%  Weight: 203 lb 9.6 oz (92.4 kg)  Height: 6' (1.829 m)   Body mass index is 27.61 kg/m.   Objective:  Physical Exam Vitals signs and nursing note reviewed.  Constitutional:      Appearance: Normal appearance.  Cardiovascular:     Rate and Rhythm: Normal rate and regular rhythm.     Heart sounds: Normal heart sounds.  Pulmonary:     Effort: Pulmonary effort is normal.     Breath sounds: Normal breath sounds.  Skin:    General: Skin is warm.  Neurological:     General: No focal deficit present.     Mental Status: He is alert.  Psychiatric:        Mood and Affect: Mood normal.         Assessment And Plan:     1. Pure hypercholesterolemia  Chronic. I will check fasting lipid panel and LFTs.  He is encouraged to avoid fried foods, take fiber supplement and increase daily activity.   - Lipid panel  2. Diabetes mellitus with stage 3 chronic kidney disease ([place])   Previous labs have been reviewed. I will check an a1c today. He denies drinking sodas; however, his wife reports he is still drinking Pepsi on a daily basis. I will make further recommendations once his labs are available for review.   - Hemoglobin A1c  3. Drug therapy  - Liver Profile - Vitamin B12  4. Overweight with body mass index (BMI) of 27 to 27.9 in adult  He is encouraged to strive to lose ten pounds to  decrease cardiac risk.   Alec Greenland, MD    THE PATIENT IS ENCOURAGED TO PRACTICE SOCIAL DISTANCING DUE TO THE COVID-19 PANDEMIC.

## 2019-06-03 ENCOUNTER — Telehealth: Payer: Self-pay

## 2019-06-03 NOTE — Telephone Encounter (Signed)
-----   Message from [name], MD sent at 06/02/2019  8:16 AM EDT ----- Your triglycerides are elevated. I can tell that you are still drinking sodas/juices and/or processed foods.  Continue with chol meds.  I suggest also incorporating more exercise into her daily routine. Your hba1c is 8.1, goal is less than 7.8. WE will recheck your hba1c in Dec. Please make necessary dietary changes. Smitty Ackerley-pls schedule lab appt if not done already. Liver fxn is nl. Your vitamin B12 is wnl.

## 2019-06-03 NOTE — Telephone Encounter (Signed)
1st attempt to give lab results  

## 2019-06-06 ENCOUNTER — Ambulatory Visit: Payer: Medicare Other | Admitting: Podiatry

## 2019-06-08 ENCOUNTER — Telehealth: Payer: Self-pay

## 2019-06-13 ENCOUNTER — Other Ambulatory Visit: Payer: Self-pay

## 2019-06-13 ENCOUNTER — Ambulatory Visit (HOSPITAL_COMMUNITY)
Admission: RE | Admit: 2019-06-13 | Discharge: 2019-06-13 | Disposition: A | Discharge status: Home / Self Care | Payer: Medicare Other | Source: Ambulatory Visit | Attending: Nephrology | Admitting: Nephrology

## 2019-06-13 VITALS — BP 119/75 | HR 82 | Temp 97.0°F | Resp 20

## 2019-06-13 DIAGNOSIS — D631 Anemia in chronic kidney disease: Secondary | ICD-10-CM | POA: Insufficient documentation

## 2019-06-13 DIAGNOSIS — N183 Chronic kidney disease, stage 3 unspecified: Secondary | ICD-10-CM | POA: Diagnosis not present

## 2019-06-13 DIAGNOSIS — N289 Disorder of kidney and ureter, unspecified: Secondary | ICD-10-CM

## 2019-06-13 MED ORDER — EPOETIN ALFA 40000 UNIT/ML IJ SOLN: 30000.0000 [IU] | INTRAMUSCULAR | Status: DC

## 2019-06-13 MED ORDER — EPOETIN ALFA 20000 UNIT/ML IJ SOLN
INTRAMUSCULAR | Status: AC
  Administered 2019-06-13: 11:00:00 20000 [IU] via SUBCUTANEOUS
  Filled 2019-06-13: qty 1

## 2019-06-13 MED ORDER — EPOETIN ALFA 10000 UNIT/ML IJ SOLN
INTRAMUSCULAR | Status: AC
  Administered 2019-06-13: 10000 [IU] via SUBCUTANEOUS
  Filled 2019-06-13: qty 1

## 2019-06-14 LAB — POCT HEMOGLOBIN-HEMACUE: Hemoglobin: 9.4 g/dL — ABNORMAL LOW (ref 13.0–17.0)

## 2019-06-23 ENCOUNTER — Telehealth: Payer: Self-pay

## 2019-06-26 ENCOUNTER — Other Ambulatory Visit: Payer: Self-pay

## 2019-06-26 MED ORDER — INSULIN ASPART FLEXPEN 100 UNIT/ML ~~LOC~~ SOPN: 4.0000 [IU] | PEN_INJECTOR | Freq: Three times a day (TID) | SUBCUTANEOUS | 0 refills | Status: DC

## 2019-06-29 ENCOUNTER — Telehealth: Payer: Self-pay

## 2019-07-04 ENCOUNTER — Other Ambulatory Visit: Payer: Self-pay

## 2019-07-04 ENCOUNTER — Ambulatory Visit (HOSPITAL_COMMUNITY)
Admission: RE | Admit: 2019-07-04 | Discharge: 2019-07-04 | Disposition: A | Discharge status: Home / Self Care | Payer: Medicare Other | Source: Ambulatory Visit | Attending: Nephrology | Admitting: Nephrology

## 2019-07-04 VITALS — BP 162/70 | HR 46 | Temp 96.9°F | Resp 20

## 2019-07-04 DIAGNOSIS — D631 Anemia in chronic kidney disease: Secondary | ICD-10-CM | POA: Insufficient documentation

## 2019-07-04 DIAGNOSIS — N289 Disorder of kidney and ureter, unspecified: Secondary | ICD-10-CM

## 2019-07-04 DIAGNOSIS — N183 Chronic kidney disease, stage 3 unspecified: Secondary | ICD-10-CM | POA: Diagnosis not present

## 2019-07-04 LAB — RENAL FUNCTION PANEL
Albumin: 3.8 g/dL (ref 3.5–5.0)
Anion gap: 11 (ref 5–15)
BUN: 32 mg/dL — ABNORMAL HIGH (ref 8–23)
CO2: 22 mmol/L (ref 22–32)
Calcium: 9.5 mg/dL (ref 8.9–10.3)
Chloride: 106 mmol/L (ref 98–111)
Creatinine, Ser: 2.06 mg/dL — ABNORMAL HIGH (ref 0.61–1.24)
GFR calc Af Amer: 33 mL/min — ABNORMAL LOW (ref >60)
GFR calc non Af Amer: 29 mL/min — ABNORMAL LOW (ref >60)
Glucose, Bld: 270 mg/dL — ABNORMAL HIGH (ref 70–99)
Phosphorus: 2.5 mg/dL (ref 2.5–4.6)
Potassium: 4 mmol/L (ref 3.5–5.1)
Sodium: 139 mmol/L (ref 135–145)

## 2019-07-04 LAB — POCT HEMOGLOBIN-HEMACUE: Hemoglobin: 9.8 g/dL — ABNORMAL LOW (ref 13.0–17.0)

## 2019-07-04 LAB — IRON AND TIBC
Iron: 66 ug/dL (ref 45–182)
Saturation Ratios: 21 % (ref 17.9–39.5)
TIBC: 308 ug/dL (ref 250–450)
UIBC: 242 ug/dL

## 2019-07-04 LAB — FERRITIN: Ferritin: 1245 ng/mL — ABNORMAL HIGH (ref 24–336)

## 2019-07-04 MED ORDER — EPOETIN ALFA 20000 UNIT/ML IJ SOLN
INTRAMUSCULAR | Status: AC
  Administered 2019-07-04: 20000 [IU] via SUBCUTANEOUS
  Filled 2019-07-04: qty 1

## 2019-07-04 MED ORDER — EPOETIN ALFA 10000 UNIT/ML IJ SOLN: 30000.0000 [IU] | INTRAMUSCULAR | Status: DC

## 2019-07-04 MED ORDER — EPOETIN ALFA 10000 UNIT/ML IJ SOLN
INTRAMUSCULAR | Status: AC
  Administered 2019-07-04: 10000 [IU] via SUBCUTANEOUS
  Filled 2019-07-04: qty 1

## 2019-07-05 LAB — PTH, INTACT AND CALCIUM
Calcium, Total (PTH): 9.6 mg/dL (ref 8.6–10.2)
PTH: 84 pg/mL — ABNORMAL HIGH (ref 15–65)

## 2019-07-13 ENCOUNTER — Telehealth: Payer: Self-pay

## 2019-07-18 ENCOUNTER — Telehealth: Payer: Self-pay

## 2019-07-19 ENCOUNTER — Ambulatory Visit (HOSPITAL_COMMUNITY)
Admission: EM | Admit: 2019-07-19 | Discharge: 2019-07-19 | Disposition: A | Discharge status: Home / Self Care | Payer: Medicare Other | Attending: Family Medicine | Admitting: Family Medicine

## 2019-07-19 ENCOUNTER — Other Ambulatory Visit: Payer: Self-pay

## 2019-07-19 ENCOUNTER — Encounter (HOSPITAL_COMMUNITY): Payer: Self-pay | Admitting: Family Medicine

## 2019-07-19 DIAGNOSIS — M10322 Gout due to renal impairment, left elbow: Secondary | ICD-10-CM

## 2019-07-19 MED ORDER — HYDROCODONE-ACETAMINOPHEN 5-325 MG PO TABS: 1.0000 | ORAL_TABLET | Freq: Four times a day (QID) | ORAL | 0 refills | Status: DC | PRN

## 2019-07-19 MED ORDER — PREDNISONE 50 MG PO TABS: ORAL_TABLET | ORAL | 0 refills | Status: DC

## 2019-07-19 MED ORDER — METHYLPREDNISOLONE ACETATE 80 MG/ML IJ SUSP
INTRAMUSCULAR | Status: AC
  Filled 2019-07-19: qty 1

## 2019-07-19 MED ORDER — METHYLPREDNISOLONE ACETATE 80 MG/ML IJ SUSP
80.0000 mg | Freq: Once | INTRAMUSCULAR | Status: AC
  Administered 2019-07-19: 80 mg via INTRAMUSCULAR

## 2019-07-19 NOTE — ED Triage Notes (Signed)
Patient presents to Urgent Care with complaints of pain and swelling from his left hand all the way up to his shoulder since a few days ago. Patient reports he has a hx of gout, states that is what it feels like, has been taking tylenol for his pain but it does not help, takes allopurinol twice a day for gout.

## 2019-07-19 NOTE — ED Notes (Signed)
Patient complains of pain in left arm, left neck.  Patient specifically mentions elbow.  Left hand is swollen.  Left radial pulse 2 +, brisk cap refill.  No fall.  Symptoms for 4 days.

## 2019-07-19 NOTE — ED Provider Notes (Signed)
[place]    CSN: [id number] Arrival date & time: 07/19/19  1034      History   Chief Complaint Chief Complaint  Patient presents with  . Gout    HPI Alec Solis is a 85 y.o. male.   This is an 85 year old man who is an established [facility] patient presents with left-sided body pain and left hand swelling.  Patient is an insulin dependent type 2 diabetic with associated problems of HTN, ASCVD, CKD, Anemia, and Gout.  Patient presents to Urgent Care with complaints of pain and swelling from his left hand all the way up to his shoulder since a few days ago. Patient reports he has a hx of gout, states that is what it feels like, has been taking tylenol for his pain but it does not help, takes allopurinol twice a day for gout.        Past Medical History:  Diagnosis Date  . Anemia   . Anxiety   . Arthritis   . Cataract   . Chronic kidney disease   . Depression   . Diabetes mellitus without complication ([place])    type II   . GERD (gastroesophageal reflux disease)   . Gout   . Heart murmur   . Hypertension   . Myocardial infarction ([place])    minor Mi- years ago -   . Varicose veins of left lower extremity     Patient Active Problem List   Diagnosis Date Noted  . Paget's disease of bone 10/23/2018  . Acute renal failure (ARF) ([place]) 06/11/2016  . Hyperglycemia 06/11/2016  . Deficiency anemia 12/09/2011  . Diabetes mellitus ([place]) 12/09/2011  . Renal insufficiency 12/09/2011    Past Surgical History:  Procedure Laterality Date  . BUNIONECTOMY    . cataract    . COLONOSCOPY WITH PROPOFOL N/A 05/28/2017   Procedure: COLONOSCOPY WITH PROPOFOL;  Surgeon: Carol Ada, MD;  Location: [facility];  Service: Endoscopy;  Laterality: N/A;  . ESOPHAGOGASTRODUODENOSCOPY (EGD) WITH PROPOFOL N/A 05/28/2017   Procedure: ESOPHAGOGASTRODUODENOSCOPY (EGD) WITH PROPOFOL;  Surgeon: Carol Ada, MD;  Location: [facility];  Service: Endoscopy;   Laterality: N/A;       Home Medications    Prior to Admission medications   Medication Sig Start Date End Date Taking? Authorizing Provider  allopurinol (ZYLOPRIM) 100 MG tablet TAKE 1 TABLET BY MOUTH TWICE DAILY 04/19/19  Yes [name], MD  acetaminophen (TYLENOL) 500 MG tablet Take 2 tablets (1,000 mg total) by mouth every 8 (eight) hours as needed. Patient taking differently: Take 500 mg by mouth every 8 (eight) hours as needed for moderate pain.  06/14/16   Ghimire, Henreitta Leber, MD  amLODipine (NORVASC) 5 MG tablet TAKE 1 TABLET BY MOUTH DAILY 04/03/19   [name], MD  aspirin 81 MG tablet Take 81 mg by mouth daily.    [provider]  B-D ULTRAFINE III SHORT PEN 31G X 8 MM MISC USE AS DIRECTED FOUR TIMES DAILY 01/10/19   [name], MD  calcitRIOL (ROCALTROL) 0.25 MCG capsule Take 0.25 mcg by mouth See admin instructions. Monday Thursday Sunday,  Pt takes every 3rd day    [provider]  carboxymethylcellulose (REFRESH PLUS) 0.5 % SOLN 1 drop 3 (three) times daily as needed. As needed    [provider]  carvedilol (COREG) 25 MG tablet Take 12.5 mg by mouth daily.     [provider]  ciclopirox (LOPROX) 0.77 % cream Apply to both  feet and between toes bid x 4 weeks 01/18/19   [name], [name]  clopidogrel (PLAVIX) 75 MG tablet Take 1 tablet (75 mg total) by mouth daily. 05/02/19   [name], MD  Ferrous Gluconate (IRON 27 PO) Take by mouth. Patient takes as needed    [provider]  furosemide (LASIX) 40 MG tablet Take 40 mg by mouth daily.    [provider]  Ginger, Zingiber officinalis, (GINGER ROOT) 550 MG CAPS Take by mouth. Patient takes prn    [provider]  glucose blood (TRUE METRIX BLOOD GLUCOSE TEST) test strip 1 each by Other route as needed for other. Use as directed to check blood sugars 08/19/18   Rodriguez-Southworth, Sunday Spillers, PA-C  HYDROcodone-acetaminophen (NORCO) 5-325 MG tablet Take  1 tablet by mouth every 6 (six) hours as needed for moderate pain. 07/19/19   Robyn Haber, MD  Icosapent Ethyl (VASCEPA) 1 g CAPS 2 bid Patient taking differently: 2 per day 10/20/18   Rodriguez-Southworth, Sunday Spillers, PA-C  Insulin Aspart FlexPen 100 UNIT/ML SOPN Inject 4 Units into the skin 3 (three) times daily before meals. 06/26/19   [name], MD  Insulin Degludec (TRESIBA FLEXTOUCH) 200 UNIT/ML SOPN Inject 22 Units into the skin daily. 10/20/18   Rodriguez-Southworth, Sunday Spillers, PA-C  Insulin Pen Needle 32G X 8 MM MISC Use as directed 01/17/19   [name], MD  omeprazole (PRILOSEC) 40 MG capsule TAKE 1 CAPSULE BY MOUTH EVERY DAY BEFORE A MEAL 06/01/19   [name], MD  predniSONE (DELTASONE) 50 MG tablet One daily with food 07/19/19   Robyn Haber, MD  rosuvastatin (CRESTOR) 20 MG tablet One tab po qd M-F ONLY 04/19/19   [name], MD  sodium bicarbonate 650 MG tablet Take 1,300 mg by mouth 2 (two) times daily.    [provider]  tamsulosin (FLOMAX) 0.4 MG CAPS capsule TAKE ONE CAPSULE BY MOUTH TWICE DAILY 06/24/18   [name], MD  TRUEPLUS LANCETS 30G MISC USE AS DIRECTED TO CHECK BLOOD SUGAR THREE TIMES DAILY 08/29/18   [name], MD    Family History Family History  Problem Relation Age of Onset  . Breast cancer Mother   . Stomach cancer Mother   . Diabetes Mellitus II Maternal Aunt     Social History Social History   Tobacco Use  . Smoking status: Former Smoker    Packs/day: 0.25    Years: 50.00    Pack years: 12.50    Types: Cigarettes  . Smokeless tobacco: Never Used  Substance Use Topics  . Alcohol use: Yes    Comment: occassional wine beer  . Drug use: No     Allergies   Patient has no known allergies.   Review of Systems Review of Systems   Physical Exam Triage Vital Signs ED Triage Vitals [07/19/19 1115]  Enc Vitals Group     BP 131/85     Pulse Rate 83     Resp 18     Temp 98.5 F (36.9 C)     Temp Source Oral      SpO2 98 %     Weight      Height      Head Circumference      Peak Flow      Pain Score      Pain Loc      Pain Edu?      Excl. in [place]?    No data found.  Updated Vital Signs BP 131/85 (BP  Location: Right Arm)   Pulse 83   Temp 98.5 F (36.9 C) (Oral)   Resp 18   SpO2 98%    Physical Exam Vitals signs and nursing note reviewed.  Constitutional:      General: He is in acute distress.     Appearance: Normal appearance. He is normal weight. He is not ill-appearing or toxic-appearing.  HENT:     Head: Normocephalic.  Eyes:     Conjunctiva/sclera: Conjunctivae normal.  Neck:     Musculoskeletal: Normal range of motion and neck supple. No muscular tenderness.  Pulmonary:     Effort: Pulmonary effort is normal.  Musculoskeletal:        General: Swelling and tenderness present. No deformity or signs of injury.     Comments: Swollen tender left thumb mcm, left elbow.  Neck is nontender but sore.  Lymphadenopathy:     Cervical: No cervical adenopathy.  Skin:    General: Skin is warm and dry.  Neurological:     General: No focal deficit present.     Mental Status: He is alert and oriented to person, place, and time.  Psychiatric:        Mood and Affect: Mood normal.        UC Treatments / Results  Labs (all labs ordered are listed, but only abnormal results are displayed) Labs Reviewed - No data to display  EKG   Radiology No results found.  Procedures Procedures (including critical care time)  Medications Ordered in UC Medications  methylPREDNISolone acetate (DEPO-MEDROL) injection 80 mg (has no administration in time range)    Initial Impression / Assessment and Plan / UC Course  I have reviewed the triage vital signs and the nursing notes.  Pertinent labs & imaging results that were available during my care of the patient were reviewed by me and considered in my medical decision making (see chart for details).    Final Clinical Impressions(s)  / UC Diagnoses   Final diagnoses:  Acute gout due to renal impairment involving left elbow   Discharge Instructions   None    ED Prescriptions    Medication Sig Dispense Auth. Provider   predniSONE (DELTASONE) 50 MG tablet One daily with food 5 tablet Robyn Haber, MD   HYDROcodone-acetaminophen (NORCO) 5-325 MG tablet Take 1 tablet by mouth every 6 (six) hours as needed for moderate pain. 12 tablet Robyn Haber, MD     I have reviewed the PDMP during this encounter.   Robyn Haber, MD 07/19/19 [phone]

## 2019-07-24 DIAGNOSIS — N183 Chronic kidney disease, stage 3 unspecified: Secondary | ICD-10-CM | POA: Diagnosis not present

## 2019-07-24 DIAGNOSIS — N2581 Secondary hyperparathyroidism of renal origin: Secondary | ICD-10-CM | POA: Diagnosis not present

## 2019-07-24 DIAGNOSIS — M109 Gout, unspecified: Secondary | ICD-10-CM | POA: Diagnosis not present

## 2019-07-24 DIAGNOSIS — D631 Anemia in chronic kidney disease: Secondary | ICD-10-CM | POA: Diagnosis not present

## 2019-07-24 DIAGNOSIS — I129 Hypertensive chronic kidney disease with stage 1 through stage 4 chronic kidney disease, or unspecified chronic kidney disease: Secondary | ICD-10-CM | POA: Diagnosis not present

## 2019-07-24 DIAGNOSIS — E1122 Type 2 diabetes mellitus with diabetic chronic kidney disease: Secondary | ICD-10-CM | POA: Diagnosis not present

## 2019-07-25 ENCOUNTER — Ambulatory Visit (HOSPITAL_COMMUNITY)
Admission: RE | Admit: 2019-07-25 | Discharge: 2019-07-25 | Disposition: A | Discharge status: Home / Self Care | Payer: Medicare Other | Source: Ambulatory Visit | Attending: Nephrology | Admitting: Nephrology

## 2019-07-25 ENCOUNTER — Other Ambulatory Visit: Payer: Self-pay

## 2019-07-25 VITALS — BP 122/89 | HR 80 | Temp 97.2°F | Resp 14 | Ht 72.0 in | Wt 197.0 lb

## 2019-07-25 DIAGNOSIS — N289 Disorder of kidney and ureter, unspecified: Secondary | ICD-10-CM | POA: Diagnosis not present

## 2019-07-25 LAB — POCT HEMOGLOBIN-HEMACUE: Hemoglobin: 9.8 g/dL — ABNORMAL LOW (ref 13.0–17.0)

## 2019-07-25 MED ORDER — EPOETIN ALFA 20000 UNIT/ML IJ SOLN
INTRAMUSCULAR | Status: AC
  Filled 2019-07-25: qty 1

## 2019-07-25 MED ORDER — EPOETIN ALFA 10000 UNIT/ML IJ SOLN
30000.0000 [IU] | INTRAMUSCULAR | Status: DC
  Administered 2019-07-25: 30000 [IU] via SUBCUTANEOUS

## 2019-07-25 MED ORDER — EPOETIN ALFA 10000 UNIT/ML IJ SOLN
INTRAMUSCULAR | Status: AC
  Filled 2019-07-25: qty 1

## 2019-07-26 MED FILL — Epoetin Alfa Inj 20000 Unit/ML: INTRAMUSCULAR | Qty: 1 | Status: AC

## 2019-07-26 MED FILL — Epoetin Alfa Inj 10000 Unit/ML: INTRAMUSCULAR | Qty: 1 | Status: AC

## 2019-08-02 ENCOUNTER — Ambulatory Visit: Payer: Self-pay | Admitting: Pharmacist

## 2019-08-02 NOTE — Patient Instructions (Signed)
Visit Information  Goals Addressed            This Visit's Progress   . I would like to manage my chronic disease states       Initial goal documentation  Current Barriers:  . Diabetes: Slightly uncontrolled; complicated by chronic medical conditions including HTN, most recent A1c 8.1% . Current antihyperglycemic regimen: Tresiba 22 units daily, Novolog 4 units TID o Prior antihyperglycemic agents: dapagliflozin (patient reports sugar went up to 600), linagliptin (unsure), sitagliptin (switched to [place]) . Reports denies hypoglycemic symptoms . Reports denies hyperglycemic symptoms . Current meal patterns: eats about 3 meals per day o Breakfast: oatmeal, cream of wheat, bacon, cheerios  o Lunch: soup, sandwich o Dinner: lamb chops, pork chops, beef stew, broccoli, cauliflower, collard greens, etc  o Snacks: does not typically eat snacks  o Drinks: water (mostly), ginger ale, pepsi, iced tea, not too many juices  . Current exercise: has not been able to go to [facility] due to COVID-19, occasionally walks to the park  . Current blood glucose readings:  o Before Breakfast: 84 - 161  o Before Lunch: 112 - 144 o Before Dinner: 92 - 140s . Cardiovascular risk reduction: o Current hypertensive regimen: amlodipine 5 mg daily and carvedilol 12.5 mg daily o Current hyperlipidemia regimen: rosuvastatin 20 mg daily  Pharmacist Clinical Goal(s):  Marland Kitchen Over the next 90 days, patient will work with PharmD and primary care provider to address optimization of DM regimen  Interventions: . Comprehensive medication review performed, medication list updated in electronic medical record . Determine if GLP-1 agonist weekly and [name] daily would be more cost-effective and safe regimen than [place] daily and Humalog three times daily  Patient Self Care Activities:  . Patient will check blood glucose daily, document, and provide at future appointments . Patient will focus on medication adherence by  continuing to take medications as prescribed . Patient will take medications as prescribed . Patient will contact provider with any episodes of hypoglycemia . Patient will report any questions or concerns to provider   Initial goal documentation         The patient verbalized understanding of instructions provided today and declined a print copy of patient instruction materials.   The care management team will reach out to the patient again over the next 14 days.   Drexel Iha, PharmD PGY2 Ambulatory Care Pharmacy Resident  Regina Eck, PharmD, BCPS Clinical Pharmacist, [facility] [place]: [phone]

## 2019-08-02 NOTE — Progress Notes (Signed)
Chronic Care Management   Initial Visit Note  08/02/2019 Name: Alec Solis MRN: [id number] DOB: 12-18-1932  Referred by: Alec Chard, MD Reason for referral : Chronic Care Management   Alec Solis is a 85 y.o. year old male who is a primary care patient of Alec Chard, MD. The CCM team was consulted for assistance with chronic disease management and care coordination needs related to DMII  Review of patient status, including review of consultants reports, relevant laboratory and other test results, and collaboration with appropriate care team members and the patient's provider was performed as part of comprehensive patient evaluation and provision of chronic care management services.    Medications: Outpatient Encounter Medications as of 08/02/2019  Medication Sig Note  . acetaminophen (ACETAMINOPHEN 8 HOUR) 650 MG CR tablet Take 650 mg by mouth daily.   Marland Kitchen allopurinol (ZYLOPRIM) 100 MG tablet TAKE 1 TABLET BY MOUTH TWICE DAILY   . amLODipine (NORVASC) 5 MG tablet TAKE 1 TABLET BY MOUTH DAILY   . aspirin 81 MG tablet Take 81 mg by mouth daily.   . B-D ULTRAFINE III SHORT PEN 31G X 8 MM MISC USE AS DIRECTED FOUR TIMES DAILY   . calcitRIOL (ROCALTROL) 0.25 MCG capsule Take 0.25 mcg by mouth See admin instructions. Monday Thursday Sunday,  Pt takes every 3rd day   . carboxymethylcellulose (REFRESH PLUS) 0.5 % SOLN 1 drop 3 (three) times daily as needed. As needed   . carvedilol (COREG) 12.5 MG tablet Take 12.5 mg by mouth 2 (two) times daily with a meal.   . clopidogrel (PLAVIX) 75 MG tablet Take 1 tablet (75 mg total) by mouth daily.   . ferrous sulfate (QC FERROUS SULFATE) 325 (65 FE) MG tablet Take 325 mg by mouth daily with breakfast.   . furosemide (LASIX) 40 MG tablet Take 40 mg by mouth daily. 08/02/2019: Uses 3x per week  . glucose blood (TRUE METRIX BLOOD GLUCOSE TEST) test strip 1 each by Other route as needed for other. Use as directed to check blood sugars   .  HYDROcodone-acetaminophen (NORCO) 5-325 MG tablet Take 1 tablet by mouth every 6 (six) hours as needed for moderate pain.   . Insulin Aspart FlexPen 100 UNIT/ML SOPN Inject 4 Units into the skin 3 (three) times daily before meals.   . Insulin Degludec (TRESIBA FLEXTOUCH) 200 UNIT/ML SOPN Inject 22 Units into the skin daily.   . Insulin Pen Needle 32G X 8 MM MISC Use as directed   . Multiple Vitamins-Minerals (MULTIVITAMIN ADULT PO) Take by mouth daily. 08/02/2019: Marca AnconaCelesta Gentile Omega Men  . Omega-3 Fatty Acids (FISH OIL OMEGA-3 PO) Take 750 mg by mouth daily. 08/02/2019: Taking OTC  . omeprazole (PRILOSEC) 40 MG capsule TAKE 1 CAPSULE BY MOUTH EVERY DAY BEFORE A MEAL 08/02/2019: Uses 4x per week  . rosuvastatin (CRESTOR) 20 MG tablet One tab po qd M-F ONLY   . saw palmetto 500 MG capsule Take by mouth daily. 08/02/2019: Pt unaware of dose  . sodium bicarbonate 650 MG tablet Take 1,300 mg by mouth 2 (two) times daily.   . TRUEPLUS LANCETS 30G MISC USE AS DIRECTED TO CHECK BLOOD SUGAR THREE TIMES DAILY   . ciclopirox (LOPROX) 0.77 % cream Apply to both feet and between toes bid x 4 weeks (Patient not taking: Reported on 08/02/2019)   . tamsulosin (FLOMAX) 0.4 MG CAPS capsule TAKE ONE CAPSULE BY MOUTH TWICE DAILY (Patient not taking: Reported on 08/02/2019)   . [DISCONTINUED] acetaminophen (TYLENOL) 500 MG  tablet Take 2 tablets (1,000 mg total) by mouth every 8 (eight) hours as needed. (Patient taking differently: Take 500 mg by mouth every 8 (eight) hours as needed for moderate pain. )   . [DISCONTINUED] carvedilol (COREG) 25 MG tablet Take 12.5 mg by mouth daily.    . [DISCONTINUED] Ferrous Gluconate (IRON 27 PO) Take by mouth. Patient takes as needed   . [DISCONTINUED] Ginger, Zingiber officinalis, (GINGER ROOT) 550 MG CAPS Take by mouth. Patient takes prn   . [DISCONTINUED] Icosapent Ethyl (VASCEPA) 1 g CAPS 2 bid (Patient taking differently: 2 per day)   . [DISCONTINUED] predniSONE (DELTASONE) 50 MG  tablet One daily with food (Patient not taking: Reported on 08/02/2019)    No facility-administered encounter medications on file as of 08/02/2019.      Objective:   Goals Addressed            This Visit's Progress   . I would like to manage my chronic disease states       Initial goal documentation  Current Barriers:  . Diabetes: Slightly uncontrolled; complicated by chronic medical conditions including HTN, most recent A1c 8.1% . Current antihyperglycemic regimen: Tresiba 22 units daily, Novolog 4 units TID o Prior antihyperglycemic agents: dapagliflozin (patient reports sugar went up to 600), linagliptin (unsure), sitagliptin (switched to [place]) . Reports denies hypoglycemic symptoms . Reports denies hyperglycemic symptoms . Current meal patterns: eats about 3 meals per day o Breakfast: oatmeal, cream of wheat, bacon, cheerios  o Lunch: soup, sandwich o Dinner: lamb chops, pork chops, beef stew, broccoli, cauliflower, collard greens, etc  o Snacks: does not typically eat snacks  o Drinks: water (mostly), ginger ale, pepsi, iced tea, not too many juices  . Current exercise: has not been able to go to [facility] due to COVID-19, occasionally walks to the park  . Current blood glucose readings:  o Before Breakfast: 84 - 161  o Before Lunch: 112 - 144 o Before Dinner: 92 - 140s . Cardiovascular risk reduction: o Current hypertensive regimen: amlodipine 5 mg daily and carvedilol 12.5 mg daily o Current hyperlipidemia regimen: rosuvastatin 20 mg daily  Pharmacist Clinical Goal(s):  Marland Kitchen Over the next 90 days, patient will work with PharmD and primary care provider to address optimization of DM regimen  Interventions: . Comprehensive medication review performed, medication list updated in electronic medical record . Determine if GLP-1 agonist weekly and [name] daily would be more cost-effective and safe regimen than [place] daily and Humalog three times daily  Patient Self Care  Activities:  . Patient will check blood glucose daily, document, and provide at future appointments . Patient will focus on medication adherence by continuing to take medications as prescribed . Patient will take medications as prescribed . Patient will contact provider with any episodes of hypoglycemia . Patient will report any questions or concerns to provider   Initial goal documentation          Mr. Flinchum was given information about Chronic Care Management services today including:  1. CCM service includes personalized support from designated clinical staff supervised by his physician, including individualized plan of care and coordination with other care providers 2. 24/7 contact phone numbers for assistance for urgent and routine care needs. 3. Service will only be billed when office clinical staff spend 20 minutes or more in a month to coordinate care. 4. Only one practitioner may furnish and bill the service in a calendar month. 5. The patient may stop CCM services at any  time (effective at the end of the month) by phone call to the office staff. 6. The patient will be responsible for cost sharing (co-pay) of up to 20% of the service fee (after annual deductible is met).  Patient agreed to services and verbal consent obtained.   Plan:   The care management team will reach out to the patient again over the next 14 days.   Drexel Iha, PharmD PGY2 Ambulatory Care Pharmacy Resident  Regina Eck, PharmD, BCPS Clinical Pharmacist, [facility] [place]: [phone]

## 2019-08-07 ENCOUNTER — Ambulatory Visit (INDEPENDENT_AMBULATORY_CARE_PROVIDER_SITE_OTHER): Payer: Medicare Other | Admitting: Internal Medicine

## 2019-08-07 ENCOUNTER — Ambulatory Visit: Payer: Self-pay

## 2019-08-07 ENCOUNTER — Encounter: Payer: Self-pay | Admitting: Internal Medicine

## 2019-08-07 ENCOUNTER — Other Ambulatory Visit: Payer: Self-pay

## 2019-08-07 VITALS — BP 112/76 | HR 82 | Temp 98.9°F | Ht 72.0 in | Wt 201.0 lb

## 2019-08-07 DIAGNOSIS — Z23 Encounter for immunization: Secondary | ICD-10-CM | POA: Diagnosis not present

## 2019-08-07 DIAGNOSIS — N183 Chronic kidney disease, stage 3 unspecified: Secondary | ICD-10-CM | POA: Diagnosis not present

## 2019-08-07 DIAGNOSIS — M10342 Gout due to renal impairment, left hand: Secondary | ICD-10-CM

## 2019-08-07 DIAGNOSIS — I129 Hypertensive chronic kidney disease with stage 1 through stage 4 chronic kidney disease, or unspecified chronic kidney disease: Secondary | ICD-10-CM

## 2019-08-07 DIAGNOSIS — E7800 Pure hypercholesterolemia, unspecified: Secondary | ICD-10-CM | POA: Diagnosis not present

## 2019-08-07 DIAGNOSIS — E1122 Type 2 diabetes mellitus with diabetic chronic kidney disease: Secondary | ICD-10-CM | POA: Diagnosis not present

## 2019-08-07 MED ORDER — PNEUMOCOCCAL 13-VAL CONJ VACC IM SUSP: 0.5000 mL | INTRAMUSCULAR | 0 refills | Status: AC

## 2019-08-07 NOTE — Progress Notes (Signed)
This visit occurred during the SARS-CoV-2 public health emergency.  Safety protocols were in place, including screening questions prior to the visit, additional usage of staff PPE, and extensive cleaning of exam room while observing appropriate contact time as indicated for disinfecting solutions.  Subjective:     Patient ID: Alec Solis , male    DOB: 09-Nov-1932 , 85 y.o.   MRN: [id number]   Chief Complaint  Patient presents with  . Hyperlipidemia  . Diabetes    HPI  He is here today for f/u high cholesterol and diabetes. He reports compliance with all meds. He admits that he is still drinking sodas. He does report taking a walk in his neighborhood on occasion.     Past Medical History:  Diagnosis Date  . Anemia   . Anxiety   . Arthritis   . Cataract   . Chronic kidney disease   . Depression   . Diabetes mellitus without complication ([place])    type II   . GERD (gastroesophageal reflux disease)   . Gout   . Heart murmur   . Hypertension   . Myocardial infarction ([place])    minor Mi- years ago -   . Varicose veins of left lower extremity      Family History  Problem Relation Age of Onset  . Breast cancer Mother   . Stomach cancer Mother   . Diabetes Mellitus II Maternal Aunt      Current Outpatient Medications:  .  acetaminophen (ACETAMINOPHEN 8 HOUR) 650 MG CR tablet, Take 650 mg by mouth daily., Disp: , Rfl:  .  allopurinol (ZYLOPRIM) 100 MG tablet, TAKE 1 TABLET BY MOUTH TWICE DAILY, Disp: 180 tablet, Rfl: 1 .  amLODipine (NORVASC) 5 MG tablet, TAKE 1 TABLET BY MOUTH DAILY, Disp: 90 tablet, Rfl: 0 .  aspirin 81 MG tablet, Take 81 mg by mouth daily., Disp: , Rfl:  .  B-D ULTRAFINE III SHORT PEN 31G X 8 MM MISC, USE AS DIRECTED FOUR TIMES DAILY, Disp: 100 each, Rfl: 3 .  calcitRIOL (ROCALTROL) 0.25 MCG capsule, Take 0.25 mcg by mouth See admin instructions. Monday Thursday Sunday,  Pt takes every 3rd day, Disp: , Rfl:  .  carboxymethylcellulose (REFRESH PLUS) 0.5 %  SOLN, 1 drop 3 (three) times daily as needed. As needed, Disp: , Rfl:  .  carvedilol (COREG) 12.5 MG tablet, Take 12.5 mg by mouth 2 (two) times daily with a meal., Disp: , Rfl:  .  clopidogrel (PLAVIX) 75 MG tablet, Take 1 tablet (75 mg total) by mouth daily., Disp: 90 tablet, Rfl: 2 .  ferrous sulfate (QC FERROUS SULFATE) 325 (65 FE) MG tablet, Take 325 mg by mouth daily with breakfast., Disp: , Rfl:  .  furosemide (LASIX) 40 MG tablet, Take 40 mg by mouth daily., Disp: , Rfl:  .  glucose blood (TRUE METRIX BLOOD GLUCOSE TEST) test strip, 1 each by Other route as needed for other. Use as directed to check blood sugars, Disp: 150 each, Rfl: 11 .  HYDROcodone-acetaminophen (NORCO) 5-325 MG tablet, Take 1 tablet by mouth every 6 (six) hours as needed for moderate pain., Disp: 12 tablet, Rfl: 0 .  Insulin Aspart FlexPen 100 UNIT/ML SOPN, Inject 4 Units into the skin 3 (three) times daily before meals., Disp: 15 mL, Rfl: 0 .  Insulin Degludec (TRESIBA FLEXTOUCH) 200 UNIT/ML SOPN, Inject 22 Units into the skin daily., Disp: 9 pen, Rfl: 0 .  Insulin Pen Needle 32G X 8 MM MISC, Use  as directed, Disp: 100 each, Rfl: 2 .  Multiple Vitamins-Minerals (MULTIVITAMIN ADULT PO), Take by mouth daily., Disp: , Rfl:  .  Omega-3 Fatty Acids (FISH OIL OMEGA-3 PO), Take 750 mg by mouth daily., Disp: , Rfl:  .  omeprazole (PRILOSEC) 40 MG capsule, TAKE 1 CAPSULE BY MOUTH EVERY DAY BEFORE A MEAL, Disp: 90 capsule, Rfl: 1 .  rosuvastatin (CRESTOR) 20 MG tablet, One tab po qd M-F ONLY, Disp: 75 tablet, Rfl: 1 .  saw palmetto 500 MG capsule, Take by mouth daily., Disp: , Rfl:  .  sodium bicarbonate 650 MG tablet, Take 1,300 mg by mouth 2 (two) times daily., Disp: , Rfl:  .  TRUEPLUS LANCETS 30G MISC, USE AS DIRECTED TO CHECK BLOOD SUGAR THREE TIMES DAILY, Disp: 300 each, Rfl: 11 .  ciclopirox (LOPROX) 0.77 % cream, Apply to both feet and between toes bid x 4 weeks (Patient not taking: Reported on 08/02/2019), Disp: 30 g,  Rfl: 1 .  pneumococcal 13-valent conjugate vaccine (PREVNAR 13) SUSP injection, Inject 0.5 mLs into the muscle tomorrow at 10 am for 1 dose., Disp: 0.5 mL, Rfl: 0   No Known Allergies   Review of Systems  Constitutional: Negative.   Respiratory: Negative.   Cardiovascular: Negative.   Gastrointestinal: Negative.   Musculoskeletal: Positive for arthralgias.       He c/o gout in his left hand. States he developed left hand pain yesterday. This is why he is wearing brace today. He is not sure what may have triggered his sx.   Neurological: Negative.   Psychiatric/Behavioral: Negative.      Today's Vitals   08/07/19 0956  BP: 112/76  Pulse: 82  Temp: 98.9 F (37.2 C)  TempSrc: Oral  Weight: 201 lb (91.2 kg)  Height: 6' (1.829 m)  PainSc: 4   PainLoc: Finger   Body mass index is 27.26 kg/m.   Objective:  Physical Exam Vitals and nursing note reviewed.  Constitutional:      Appearance: Normal appearance.  Cardiovascular:     Rate and Rhythm: Normal rate and regular rhythm.     Heart sounds: Normal heart sounds.  Pulmonary:     Effort: Pulmonary effort is normal.     Breath sounds: Normal breath sounds.  Musculoskeletal:     Comments: Left wrist brace in place.   Skin:    General: Skin is warm.  Neurological:     General: No focal deficit present.     Mental Status: He is alert.  Psychiatric:        Mood and Affect: Mood normal.         Assessment And Plan:      1. Pure hypercholesterolemia  Chronic. Results from October 2020 were reviewed. I will not check repeat panel today. Importance of medication compliance was discussed with the patient.   2. Diabetes mellitus with stage 3 chronic kidney disease (HCC)  Chronic. He reports recent Renal visit. Therefore, I will not check renal function today. I will check an a1c.  I will make further recommendations once his labs are available for review.   - Hemoglobin A1c  3. Hypertensive nephropathy  Chronic, well  controlled. He will continue with current meds.   4. Acute gout due to renal impairment involving left hand  I will check uric acid level today. He is encouraged to stay well hydrated.   - Uric acid  5. Immunization due  Rx prevnar-13 was sent to his pharmacy.   Theda Belfast  Baird Cancer, MD    THE PATIENT IS ENCOURAGED TO PRACTICE SOCIAL DISTANCING DUE TO THE COVID-19 PANDEMIC.

## 2019-08-07 NOTE — Patient Instructions (Signed)
Gout  Gout is painful swelling of your joints. Gout is a type of arthritis. It is caused by having too much uric acid in your body. Uric acid is a chemical that is made when your body breaks down substances called purines. If your body has too much uric acid, sharp crystals can form and build up in your joints. This causes pain and swelling. Gout attacks can happen quickly and be very painful (acute gout). Over time, the attacks can affect more joints and happen more often (chronic gout). What are the causes?  Too much uric acid in your blood. This can happen because: ? Your kidneys do not remove enough uric acid from your blood. ? Your body makes too much uric acid. ? You eat too many foods that are high in purines. These foods include organ meats, some seafood, and beer.  Trauma or stress. What increases the risk?  Having a family history of gout.  Being male and middle-aged.  Being male and having gone through menopause.  Being very overweight (obese).  Drinking alcohol, especially beer.  Not having enough water in the body (being dehydrated).  Losing weight too quickly.  Having an organ transplant.  Having lead poisoning.  Taking certain medicines.  Having kidney disease.  Having a skin condition called psoriasis. What are the signs or symptoms? An attack of acute gout usually happens in just one joint. The most common place is the big toe. Attacks often start at night. Other joints that may be affected include joints of the feet, ankle, knee, fingers, wrist, or elbow. Symptoms of an attack may include:  Very bad pain.  Warmth.  Swelling.  Stiffness.  Shiny, red, or purple skin.  Tenderness. The affected joint may be very painful to touch.  Chills and fever. Chronic gout may cause symptoms more often. More joints may be involved. You may also have white or yellow lumps (tophi) on your hands or feet or in other areas near your joints. How is this  treated?  Treatment for this condition has two phases: treating an acute attack and preventing future attacks.  Acute gout treatment may include: ? NSAIDs. ? Steroids. These are taken by mouth or injected into a joint. ? Colchicine. This medicine relieves pain and swelling. It can be given by mouth or through an IV tube.  Preventive treatment may include: ? Taking small doses of NSAIDs or colchicine daily. ? Using a medicine that reduces uric acid levels in your blood. ? Making changes to your diet. You may need to see a food expert (dietitian) about what to eat and drink to prevent gout. Follow these instructions at home: During a gout attack   If told, put ice on the painful area: ? Put ice in a plastic bag. ? Place a towel between your skin and the bag. ? Leave the ice on for 20 minutes, 2-3 times a day.  Raise (elevate) the painful joint above the level of your heart as often as you can.  Rest the joint as much as possible. If the joint is in your leg, you may be given crutches.  Follow instructions from your doctor about what you cannot eat or drink. Avoiding future gout attacks  Eat a low-purine diet. Avoid foods and drinks such as: ? Liver. ? Kidney. ? Anchovies. ? Asparagus. ? Herring. ? Mushrooms. ? Mussels. ? Beer.  Stay at a healthy weight. If you want to lose weight, talk with your doctor. Do not lose weight   too fast.  Start or continue an exercise plan as told by your doctor. Eating and drinking  Drink enough fluids to keep your pee (urine) pale yellow.  If you drink alcohol: ? Limit how much you use to:  0-1 drink a day for women.  0-2 drinks a day for men. ? Be aware of how much alcohol is in your drink. In the [place] one drink equals one 12 oz bottle of beer (355 mL), one 5 oz glass of wine (148 mL), or one 1 oz glass of hard liquor (44 mL). General instructions  Take over-the-counter and prescription medicines only as told by your doctor.  Do  not drive or use heavy machinery while taking prescription pain medicine.  Return to your normal activities as told by your doctor. Ask your doctor what activities are safe for you.  Keep all follow-up visits as told by your doctor. This is important. Contact a doctor if:  You have another gout attack.  You still have symptoms of a gout attack after 10 days of treatment.  You have problems (side effects) because of your medicines.  You have chills or a fever.  You have burning pain when you pee (urinate).  You have pain in your lower back or belly. Get help right away if:  You have very bad pain.  Your pain cannot be controlled.  You cannot pee. Summary  Gout is painful swelling of the joints.  The most common site of pain is the big toe, but it can affect other joints.  Medicines and avoiding some foods can help to prevent and treat gout attacks. This information is not intended to replace advice given to you by your health care provider. Make sure you discuss any questions you have with your health care provider. Document Released: 05/19/2008 Document Revised: 03/02/2018 Document Reviewed: 03/02/2018 Elsevier Patient Education  2020 Elsevier Inc.  

## 2019-08-08 ENCOUNTER — Other Ambulatory Visit: Payer: Self-pay

## 2019-08-08 LAB — HEMOGLOBIN A1C
Est. average glucose Bld gHb Est-mCnc: 177 mg/dL
Hgb A1c MFr Bld: 7.8 % — ABNORMAL HIGH (ref 4.8–5.6)

## 2019-08-08 LAB — URIC ACID: Uric Acid: 4.3 mg/dL (ref 3.8–8.4)

## 2019-08-11 ENCOUNTER — Telehealth: Payer: Self-pay | Admitting: Pharmacist

## 2019-08-11 NOTE — Telephone Encounter (Signed)
Called patient on 08/11/2019 at 11:04 AM and left HIPAA-compliant VM with instructions to call [facility] pharmacist phone number back on home phone number. Attempted to call cell phone as well, however, was unable to leave VM.  Plan to discuss switching from [place] 22 units daily and Novolog 4 units TID --> Tresiba 12 units daily and Ozempic 0.25 mg weekly (regimen discussed with Dr. Baird Cancer (recommendations appreciated)).   This would be ~60% reduction in insulin, which is a little more aggressive than typical 30-50% reduction in insulin when initiating GLP due to how patient is is 85 years old and risk of hypoglycemia.   Also, Tresiba/Novolog/Ozempic/Trulicity are each 0000000 for 1 month supply through patient's insurance. So essentially the same price but may be a little bit cheaper if we can continue to decrease [place] dose [name] 12 units daily - 25 day supply for each pen while Tresiba 22 units daily - 13 day supply).   Will attempt to follow up with patient again on 08/14/2019.  Thank you for involving pharmacy to assist in providing Mr. Vanlanen's care.   Drexel Iha, PharmD PGY2 Ambulatory Care Pharmacy Resident

## 2019-08-14 NOTE — Telephone Encounter (Signed)
Called patient on 08/14/2019 at 11:25 AM and left HIPAA-compliant VM with instructions to call [facility] pharmacy phone number back on home phone number. Attempted to call cell phone as well, however, was unable to leave VM.  Plan to discuss switching from basal/bolus insulin to GLP-1 agonist with basal insulin. Plan to reach out to patient again in ~1 week if he has not returned call by that time.  Drexel Iha, PharmD PGY2 Ambulatory Care Pharmacy Resident   .

## 2019-08-14 NOTE — Telephone Encounter (Signed)
Sounds great! Thank you for your assistance!

## 2019-08-15 ENCOUNTER — Ambulatory Visit (HOSPITAL_COMMUNITY)
Admission: RE | Admit: 2019-08-15 | Discharge: 2019-08-15 | Disposition: A | Discharge status: Home / Self Care | Payer: Medicare Other | Source: Ambulatory Visit | Attending: Nephrology | Admitting: Nephrology

## 2019-08-15 ENCOUNTER — Ambulatory Visit: Payer: Medicare Other | Admitting: Internal Medicine

## 2019-08-15 ENCOUNTER — Other Ambulatory Visit: Payer: Self-pay

## 2019-08-15 VITALS — BP 136/94 | HR 83 | Temp 96.7°F | Resp 20

## 2019-08-15 DIAGNOSIS — N183 Chronic kidney disease, stage 3 unspecified: Secondary | ICD-10-CM | POA: Diagnosis present

## 2019-08-15 DIAGNOSIS — N289 Disorder of kidney and ureter, unspecified: Secondary | ICD-10-CM

## 2019-08-15 DIAGNOSIS — D631 Anemia in chronic kidney disease: Secondary | ICD-10-CM | POA: Diagnosis not present

## 2019-08-15 LAB — RENAL FUNCTION PANEL
Albumin: 3.6 g/dL (ref 3.5–5.0)
Anion gap: 11 (ref 5–15)
BUN: 30 mg/dL — ABNORMAL HIGH (ref 8–23)
CO2: 20 mmol/L — ABNORMAL LOW (ref 22–32)
Calcium: 9.4 mg/dL (ref 8.9–10.3)
Chloride: 107 mmol/L (ref 98–111)
Creatinine, Ser: 1.81 mg/dL — ABNORMAL HIGH (ref 0.61–1.24)
GFR calc Af Amer: 39 mL/min — ABNORMAL LOW (ref >60)
GFR calc non Af Amer: 34 mL/min — ABNORMAL LOW (ref >60)
Glucose, Bld: 276 mg/dL — ABNORMAL HIGH (ref 70–99)
Phosphorus: 2.7 mg/dL (ref 2.5–4.6)
Potassium: 4.5 mmol/L (ref 3.5–5.1)
Sodium: 138 mmol/L (ref 135–145)

## 2019-08-15 LAB — IRON AND TIBC
Iron: 83 ug/dL (ref 45–182)
Saturation Ratios: 26 % (ref 17.9–39.5)
TIBC: 318 ug/dL (ref 250–450)
UIBC: 235 ug/dL

## 2019-08-15 LAB — POCT HEMOGLOBIN-HEMACUE: Hemoglobin: 9 g/dL — ABNORMAL LOW (ref 13.0–17.0)

## 2019-08-15 LAB — FERRITIN: Ferritin: 1061 ng/mL — ABNORMAL HIGH (ref 24–336)

## 2019-08-15 MED ORDER — EPOETIN ALFA 10000 UNIT/ML IJ SOLN: 30000.0000 [IU] | INTRAMUSCULAR | Status: DC

## 2019-08-15 MED ORDER — EPOETIN ALFA 20000 UNIT/ML IJ SOLN
INTRAMUSCULAR | Status: AC
  Administered 2019-08-15: 20000 [IU] via SUBCUTANEOUS
  Filled 2019-08-15: qty 1

## 2019-08-15 MED ORDER — EPOETIN ALFA 10000 UNIT/ML IJ SOLN
INTRAMUSCULAR | Status: AC
  Administered 2019-08-15: 10000 [IU] via SUBCUTANEOUS
  Filled 2019-08-15: qty 1

## 2019-08-16 ENCOUNTER — Ambulatory Visit (INDEPENDENT_AMBULATORY_CARE_PROVIDER_SITE_OTHER): Payer: Medicare Other | Admitting: Pharmacist

## 2019-08-16 DIAGNOSIS — N183 Chronic kidney disease, stage 3 unspecified: Secondary | ICD-10-CM | POA: Diagnosis not present

## 2019-08-16 DIAGNOSIS — E1122 Type 2 diabetes mellitus with diabetic chronic kidney disease: Secondary | ICD-10-CM

## 2019-08-16 LAB — PTH, INTACT AND CALCIUM
Calcium, Total (PTH): 9.6 mg/dL (ref 8.6–10.2)
PTH: 86 pg/mL — ABNORMAL HIGH (ref 15–65)

## 2019-08-21 ENCOUNTER — Ambulatory Visit: Payer: Self-pay | Admitting: Pharmacist

## 2019-08-21 ENCOUNTER — Telehealth: Payer: Self-pay

## 2019-08-21 NOTE — Patient Instructions (Signed)
Visit Information  Goals Addressed            This Visit's Progress     Patient Stated   . I would like to manage my chronic disease states (pt-stated)       Current Barriers:  . Diabetes: Slightly uncontrolled; complicated by chronic medical conditions including HTN, most recent A1c 8.1% . Current antihyperglycemic regimen: Tresiba 22 units daily, Novolog 4 units TID o Prior antihyperglycemic agents: dapagliflozin (patient reports sugar went up to 600), linagliptin (unsure), sitagliptin (switched to [place]) . Denies hypoglycemic symptoms . Denies hyperglycemic symptoms . Current meal patterns: eats about 3 meals per day o Breakfast: oatmeal, cream of wheat, bacon, cheerios  o Lunch: soup, sandwich o Dinner: lamb chops, pork chops, beef stew, broccoli, cauliflower, collard greens, etc  o Snacks: does not typically eat snacks  o Drinks: water (mostly), ginger ale, pepsi, iced tea, not too many juices  . Current exercise: has not been able to go to [facility] due to COVID-19, occasionally walks to the park  . Current blood glucose readings:  o Before Breakfast: 84 - 161  o Before Lunch: 112 - 144 o Before Dinner: 92 - 140s . Cardiovascular risk reduction: o Current hypertensive regimen: amlodipine 5 mg daily and carvedilol 12.5 mg daily o Current hyperlipidemia regimen: rosuvastatin 20 mg daily  Pharmacist Clinical Goal(s):  Marland Kitchen Over the next 90 days, patient will work with PharmD and primary care provider to address optimization of DM regimen  Interventions: Call completed on 08/16/19 . Comprehensive medication review performed, medication list updated in electronic medical record . Discussed GLP-1 agonist weekly and [place] daily.  Patient is interested in this change because he does not enjoy injecting himself 3-4 times daily. . Clinic visit scheduled for 08/23/19.  Patient Self Care Activities:  . Patient will check blood glucose daily, document, and provide at future  appointments . Patient will focus on medication adherence by continuing to take medications as prescribed . Patient will take medications as prescribed . Patient will contact provider with any episodes of hypoglycemia . Patient will report any questions or concerns to provider   Please see past updates related to this goal by clicking on the "Past Updates" button in the selected goal          The patient verbalized understanding of instructions provided today and declined a print copy of patient instruction materials.   The care management team will reach out to the patient again over the next 7 days.   SIGNATURE Regina Eck, PharmD, BCPS Clinical Pharmacist, [facility] [place]: [phone]

## 2019-08-21 NOTE — Progress Notes (Signed)
  Chronic Care Management   Outreach Note  08/21/2019 Name: Alec Solis MRN: [id number] DOB: 09/19/1932  Referred by: [name], MD Reason for referral : Chronic Care Management  Successful call to [place] and patient's insurance.  Per [facility] pharmacist, patient received Prevnar 13 on 02/21/2015.  Will notify PCP and CMA to cancel order.  No further action required    Regina Eck, PharmD, BCPS Clinical Pharmacist, [facility]: [phone]

## 2019-08-21 NOTE — Progress Notes (Signed)
Chronic Care Management   Visit Note  08/16/2019 Name: Alec Solis MRN: [id number] DOB: [birth year]  Referred by: Alec Chard, MD Reason for referral : Chronic Care Management   Alec Solis is a 85 y.o. year old male who is a primary care patient of Alec Chard, MD. The CCM team was consulted for assistance with chronic disease management and care coordination needs related to DMII  Review of patient status, including review of consultants reports, relevant laboratory and other test results, and collaboration with appropriate care team members and the patient's provider was performed as part of comprehensive patient evaluation and provision of chronic care management services.    I spoke with Mr. Mais by telephone today regarding his diabetes  Medications: Outpatient Encounter Medications as of 08/16/2019  Medication Sig Note  . acetaminophen (ACETAMINOPHEN 8 HOUR) 650 MG CR tablet Take 650 mg by mouth daily.   Alec Solis allopurinol (ZYLOPRIM) 100 MG tablet TAKE 1 TABLET BY MOUTH TWICE DAILY   . amLODipine (NORVASC) 5 MG tablet TAKE 1 TABLET BY MOUTH DAILY   . aspirin 81 MG tablet Take 81 mg by mouth daily.   . B-D ULTRAFINE III SHORT PEN 31G X 8 MM MISC USE AS DIRECTED FOUR TIMES DAILY   . calcitRIOL (ROCALTROL) 0.25 MCG capsule Take 0.25 mcg by mouth See admin instructions. Monday Thursday Sunday,  Pt takes every 3rd day   . carboxymethylcellulose (REFRESH PLUS) 0.5 % SOLN 1 drop 3 (three) times daily as needed. As needed   . carvedilol (COREG) 12.5 MG tablet Take 12.5 mg by mouth 2 (two) times daily with a meal.   . ciclopirox (LOPROX) 0.77 % cream Apply to both feet and between toes bid x 4 weeks (Patient not taking: Reported on 08/02/2019)   . clopidogrel (PLAVIX) 75 MG tablet Take 1 tablet (75 mg total) by mouth daily.   . ferrous sulfate (QC FERROUS SULFATE) 325 (65 FE) MG tablet Take 325 mg by mouth daily with breakfast.   . furosemide (LASIX) 40 MG tablet Take 40 mg by mouth  daily. 08/02/2019: Uses 3x per week  . glucose blood (TRUE METRIX BLOOD GLUCOSE TEST) test strip 1 each by Other route as needed for other. Use as directed to check blood sugars   . HYDROcodone-acetaminophen (NORCO) 5-325 MG tablet Take 1 tablet by mouth every 6 (six) hours as needed for moderate pain.   . Insulin Aspart FlexPen 100 UNIT/ML SOPN Inject 4 Units into the skin 3 (three) times daily before meals.   . Insulin Degludec (TRESIBA FLEXTOUCH) 200 UNIT/ML SOPN Inject 22 Units into the skin daily.   . Insulin Pen Needle 32G X 8 MM MISC Use as directed   . Multiple Vitamins-Minerals (MULTIVITAMIN ADULT PO) Take by mouth daily. 08/02/2019: Marca AnconaCelesta Solis Omega Men  . Omega-3 Fatty Acids (FISH OIL OMEGA-3 PO) Take 750 mg by mouth daily. 08/02/2019: Taking OTC  . omeprazole (PRILOSEC) 40 MG capsule TAKE 1 CAPSULE BY MOUTH EVERY DAY BEFORE A MEAL 08/02/2019: Uses 4x per week  . rosuvastatin (CRESTOR) 20 MG tablet One tab po qd M-F ONLY   . saw palmetto 500 MG capsule Take by mouth daily. 08/02/2019: Pt unaware of dose  . sodium bicarbonate 650 MG tablet Take 1,300 mg by mouth 2 (two) times daily.   . TRUEPLUS LANCETS 30G MISC USE AS DIRECTED TO CHECK BLOOD SUGAR THREE TIMES DAILY    No facility-administered encounter medications on file as of 08/16/2019.     Objective:  Goals Addressed            This Visit's Progress     Patient Stated   . I would like to manage my chronic disease states (pt-stated)       Current Barriers:  . Diabetes: Slightly uncontrolled; complicated by chronic medical conditions including HTN, most recent A1c 8.1% . Current antihyperglycemic regimen: Tresiba 22 units daily, Novolog 4 units TID o Prior antihyperglycemic agents: dapagliflozin (patient reports sugar went up to 600), linagliptin (unsure), sitagliptin (switched to [place]) . Denies hypoglycemic symptoms . Denies hyperglycemic symptoms . Current meal patterns: eats about 3 meals per day o Breakfast:  oatmeal, cream of wheat, bacon, cheerios  o Lunch: soup, sandwich o Dinner: lamb chops, pork chops, beef stew, broccoli, cauliflower, collard greens, etc  o Snacks: does not typically eat snacks  o Drinks: water (mostly), ginger ale, pepsi, iced tea, not too many juices  . Current exercise: has not been able to go to [facility] due to COVID-19, occasionally walks to the park  . Current blood glucose readings:  o Before Breakfast: 84 - 161  o Before Lunch: 112 - 144 o Before Dinner: 92 - 140s . Cardiovascular risk reduction: o Current hypertensive regimen: amlodipine 5 mg daily and carvedilol 12.5 mg daily o Current hyperlipidemia regimen: rosuvastatin 20 mg daily  Pharmacist Clinical Goal(s):  Alec Solis Over the next 90 days, patient will work with PharmD and primary care provider to address optimization of DM regimen  Interventions: Call completed on 08/16/19 . Comprehensive medication review performed, medication list updated in electronic medical record . Discussed GLP-1 agonist weekly and [place] daily.  Patient is interested in this change because he does not enjoy injecting himself 3-4 times daily. . Clinic visit scheduled for 08/23/19.  Patient Self Care Activities:  . Patient will check blood glucose daily, document, and provide at future appointments . Patient will focus on medication adherence by continuing to take medications as prescribed . Patient will take medications as prescribed . Patient will contact provider with any episodes of hypoglycemia . Patient will report any questions or concerns to provider   Please see past updates related to this goal by clicking on the "Past Updates" button in the selected goal           Plan:   The care management team will reach out to the patient again over the next 7 days.   Provider Signature Regina Eck, PharmD, BCPS Clinical Pharmacist, [facility] [place]: [phone]

## 2019-08-23 ENCOUNTER — Telehealth: Payer: Medicare Other | Admitting: Pharmacist

## 2019-08-30 ENCOUNTER — Telehealth: Payer: Self-pay | Admitting: Pharmacist

## 2019-08-30 ENCOUNTER — Telehealth: Payer: Self-pay

## 2019-08-30 ENCOUNTER — Ambulatory Visit (INDEPENDENT_AMBULATORY_CARE_PROVIDER_SITE_OTHER): Payer: Medicare Other | Admitting: Pharmacist

## 2019-08-30 DIAGNOSIS — N183 Chronic kidney disease, stage 3 unspecified: Secondary | ICD-10-CM | POA: Diagnosis not present

## 2019-08-30 DIAGNOSIS — E1122 Type 2 diabetes mellitus with diabetic chronic kidney disease: Secondary | ICD-10-CM

## 2019-08-30 NOTE — Patient Instructions (Signed)
Visit Information  Goals Addressed            This Visit's Progress     Patient Stated   . I would like to manage my chronic disease states (pt-stated)       Current Barriers:  . Diabetes: Slightly uncontrolled; complicated by chronic medical conditions including HTN, most recent A1c 8.1% . Patient states his BG readings have been ~170. He states he has discontinued using bolus insulin. He is tolerating recent initiation of Ozempic - experienced slight nausea, fatigue - but is tolerating. He states he administered his first dose on 08/24/2019. He reports enjoying not administering as many injections througout the day. . Patient had questions regarding appropriate amount of time to check BG . Patient states he has Tax 1099 form and will bring by office tomorrow . Current antihyperglycemic regimen: Tresiba 12 units daily, Ozempic 0.25 mg weekly (Thursdays) o Prior antihyperglycemic agents: dapagliflozin (patient reports sugar went up to 600), linagliptin (unsure), sitagliptin (switched to [place]) . Denies hypoglycemic symptoms . Denies hyperglycemic symptoms . Current meal patterns: eats about 3 meals per day o Breakfast: oatmeal, cream of wheat, bacon, cheerios  o Lunch: soup, sandwich o Dinner: lamb chops, pork chops, beef stew, broccoli, cauliflower, collard greens, etc  o Snacks: does not typically eat snacks  o Drinks: water (mostly), ginger ale, pepsi, iced tea, not too many juices  . Current exercise: has not been able to go to [facility] due to COVID-19, occasionally walks to the park  . Current blood glucose readings:  o Before Breakfast: 84 - 161  o Before Lunch: 112 - 144 o Before Dinner: 92 - 140s . Cardiovascular risk reduction: o Current hypertensive regimen: amlodipine 5 mg daily and carvedilol 12.5 mg daily o Current hyperlipidemia regimen: rosuvastatin 20 mg daily  Pharmacist Clinical Goal(s):  Marland Kitchen Over the next 90 days, patient will work with PharmD and primary care  provider to address optimization of DM regimen  Interventions: Call completed on 08/16/19 . Comprehensive medication review performed, medication list updated in electronic medical record . BG are elevated which are likely attributed to recent initiation of Ozempic. It is likely BG reading swill continue to lower the longer he takes [place] and continues [place]. Continue current DM regimen and checking BG readings consistently. Will follow up in 2 weeks. . Patient had questions regarding appropriate amount of time to check BG - recommended to check fasting blood glucose daily. . Plan to fax PAP application tomorrow for Ozempic and [name] once Mr. Seright brings by financial documentation.  Patient Self Care Activities:  . Patient will check blood glucose daily, document, and provide at future appointments . Patient will focus on medication adherence by continuing to take medications as prescribed . Patient will take medications as prescribed . Patient will contact provider with any episodes of hypoglycemia . Patient will report any questions or concerns to provider   Please see past updates related to this goal by clicking on the "Past Updates" button in the selected goal          The patient verbalized understanding of instructions provided today and declined a print copy of patient instruction materials.   The care management team will reach out to the patient again over the next 14 days.   Drexel Iha, PharmD PGY2 Ambulatory Care Pharmacy Resident  Regina Eck, PharmD, BCPS Clinical Pharmacist, [facility] [place]: [phone]

## 2019-08-30 NOTE — Progress Notes (Signed)
Chronic Care Management    08/30/2019 Name: Alec Solis MRN: [id number] DOB: [birth year]  Referred by: [name], MD Reason for referral : Chronic Care Management (DM)   Alec Solis is a 86 y.o. year old male who is a primary care patient of [name], MD. The CCM team was consulted for assistance with chronic disease management and care coordination needs related to DMII  Review of patient status, including review of consultants reports, relevant laboratory and other test results, and collaboration with appropriate care team members and the patient's provider was performed as part of comprehensive patient evaluation and provision of chronic care management services.    Patient contacted today for follow up appt via telephone.  Medications: Outpatient Encounter Medications as of 08/30/2019  Medication Sig Note  . acetaminophen (ACETAMINOPHEN 8 HOUR) 650 MG CR tablet Take 650 mg by mouth daily.   Marland Kitchen allopurinol (ZYLOPRIM) 100 MG tablet TAKE 1 TABLET BY MOUTH TWICE DAILY   . amLODipine (NORVASC) 5 MG tablet TAKE 1 TABLET BY MOUTH DAILY   . aspirin 81 MG tablet Take 81 mg by mouth daily.   . B-D ULTRAFINE III SHORT PEN 31G X 8 MM MISC USE AS DIRECTED FOUR TIMES DAILY   . calcitRIOL (ROCALTROL) 0.25 MCG capsule Take 0.25 mcg by mouth See admin instructions. Monday Thursday Sunday,  Pt takes every 3rd day   . carboxymethylcellulose (REFRESH PLUS) 0.5 % SOLN 1 drop 3 (three) times daily as needed. As needed   . carvedilol (COREG) 12.5 MG tablet Take 12.5 mg by mouth 2 (two) times daily with a meal.   . ciclopirox (LOPROX) 0.77 % cream Apply to both feet and between toes bid x 4 weeks (Patient not taking: Reported on 08/02/2019)   . clopidogrel (PLAVIX) 75 MG tablet Take 1 tablet (75 mg total) by mouth daily.   . ferrous sulfate (QC FERROUS SULFATE) 325 (65 FE) MG tablet Take 325 mg by mouth daily with breakfast.   . furosemide (LASIX) 40 MG tablet Take 40 mg by mouth daily. 08/02/2019:  Uses 3x per week  . glucose blood (TRUE METRIX BLOOD GLUCOSE TEST) test strip 1 each by Other route as needed for other. Use as directed to check blood sugars   . HYDROcodone-acetaminophen (NORCO) 5-325 MG tablet Take 1 tablet by mouth every 6 (six) hours as needed for moderate pain.   . Insulin Degludec (TRESIBA FLEXTOUCH) 200 UNIT/ML SOPN Inject 22 Units into the skin daily.   . Insulin Pen Needle 32G X 8 MM MISC Use as directed   . Multiple Vitamins-Minerals (MULTIVITAMIN ADULT PO) Take by mouth daily. 08/02/2019: Marca AnconaCelesta Gentile Omega Men  . Omega-3 Fatty Acids (FISH OIL OMEGA-3 PO) Take 750 mg by mouth daily. 08/02/2019: Taking OTC  . omeprazole (PRILOSEC) 40 MG capsule TAKE 1 CAPSULE BY MOUTH EVERY DAY BEFORE A MEAL 08/02/2019: Uses 4x per week  . rosuvastatin (CRESTOR) 20 MG tablet One tab po qd M-F ONLY   . saw palmetto 500 MG capsule Take by mouth daily. 08/02/2019: Pt unaware of dose  . sodium bicarbonate 650 MG tablet Take 1,300 mg by mouth 2 (two) times daily.   . TRUEPLUS LANCETS 30G MISC USE AS DIRECTED TO CHECK BLOOD SUGAR THREE TIMES DAILY   . [DISCONTINUED] Insulin Aspart FlexPen 100 UNIT/ML SOPN Inject 4 Units into the skin 3 (three) times daily before meals.    No facility-administered encounter medications on file as of 08/30/2019.     Objective:   Goals Addressed  This Visit's Progress     Patient Stated   . I would like to manage my chronic disease states (pt-stated)       Current Barriers:  . Diabetes: Slightly uncontrolled; complicated by chronic medical conditions including HTN, most recent A1c 8.1% . Patient states his BG readings have been ~170. He states he has discontinued using bolus insulin. He is tolerating recent initiation of Ozempic - experienced slight nausea, fatigue - but is tolerating. He states he administered his first dose on 08/24/2019. He reports enjoying not administering as many injections througout the day. . Patient had questions regarding  appropriate amount of time to check BG . Patient states he has Tax 1099 form and will bring by office tomorrow . Current antihyperglycemic regimen: Tresiba 12 units daily, Ozempic 0.25 mg weekly (Thursdays) o Prior antihyperglycemic agents: dapagliflozin (patient reports sugar went up to 600), linagliptin (unsure), sitagliptin (switched to [place]) . Denies hypoglycemic symptoms . Denies hyperglycemic symptoms . Current meal patterns: eats about 3 meals per day o Breakfast: oatmeal, cream of wheat, bacon, cheerios  o Lunch: soup, sandwich o Dinner: lamb chops, pork chops, beef stew, broccoli, cauliflower, collard greens, etc  o Snacks: does not typically eat snacks  o Drinks: water (mostly), ginger ale, pepsi, iced tea, not too many juices  . Current exercise: has not been able to go to [facility] due to COVID-19, occasionally walks to the park  . Current blood glucose readings:  o Before Breakfast: 84 - 161  o Before Lunch: 112 - 144 o Before Dinner: 92 - 140s . Cardiovascular risk reduction: o Current hypertensive regimen: amlodipine 5 mg daily and carvedilol 12.5 mg daily o Current hyperlipidemia regimen: rosuvastatin 20 mg daily  Pharmacist Clinical Goal(s):  Marland Kitchen Over the next 90 days, patient will work with PharmD and primary care provider to address optimization of DM regimen  Interventions: Call completed on 08/30/19 . Comprehensive medication review performed, medication list updated in electronic medical record . BG are elevated which are likely attributed to recent initiation of Ozempic. It is likely BG reading swill continue to lower the longer he takes [place] and continues [place]. Continue current DM regimen and checking BG readings consistently. Will follow up in 2 weeks. . Patient had questions regarding appropriate amount of time to check BG - recommended to check fasting blood glucose daily. . Plan to fax PAP application tomorrow for Ozempic and [name] once Mr. Ciolli brings by  financial documentation.  Patient Self Care Activities:  . Patient will check blood glucose daily, document, and provide at future appointments . Patient will focus on medication adherence by continuing to take medications as prescribed . Patient will take medications as prescribed . Patient will contact provider with any episodes of hypoglycemia . Patient will report any questions or concerns to provider   Please see past updates related to this goal by clicking on the "Past Updates" button in the selected goal            Plan:   The care management team will reach out to the patient again over the next 14 days.   Drexel Iha, PharmD PGY2 Ambulatory Care Pharmacy Resident  Regina Eck, PharmD, BCPS Clinical Pharmacist, [facility] [place]: [phone]

## 2019-09-04 ENCOUNTER — Ambulatory Visit: Payer: Self-pay | Admitting: Pharmacist

## 2019-09-04 DIAGNOSIS — E1122 Type 2 diabetes mellitus with diabetic chronic kidney disease: Secondary | ICD-10-CM | POA: Diagnosis not present

## 2019-09-04 DIAGNOSIS — N183 Chronic kidney disease, stage 3 unspecified: Secondary | ICD-10-CM | POA: Diagnosis not present

## 2019-09-04 NOTE — Progress Notes (Signed)
Chronic Care Management   Visit Note  09/04/2019 Name: Alec Solis MRN: [id number] DOB: 12/17/1932  Referred by: [name], MD Reason for referral : Chronic Care Management   Alec Solis is a 86 y.o. year old male who is a primary care patient of [name], MD. The CCM team was consulted for assistance with chronic disease management and care coordination needs related to DMII  Review of patient status, including review of consultants reports, relevant laboratory and other test results, and collaboration with appropriate care team members and the patient's provider was performed as part of comprehensive patient evaluation and provision of chronic care management services.    Medications: Outpatient Encounter Medications as of 09/04/2019  Medication Sig Note  . Semaglutide,0.25 or 0.5MG /DOS, (OZEMPIC, 0.25 OR 0.5 MG/DOSE,) 2 MG/1.5ML SOPN Inject 0.5 mg into the skin once a week.   Marland Kitchen acetaminophen (ACETAMINOPHEN 8 HOUR) 650 MG CR tablet Take 650 mg by mouth daily.   Marland Kitchen allopurinol (ZYLOPRIM) 100 MG tablet TAKE 1 TABLET BY MOUTH TWICE DAILY   . amLODipine (NORVASC) 5 MG tablet TAKE 1 TABLET BY MOUTH DAILY   . aspirin 81 MG tablet Take 81 mg by mouth daily.   . B-D ULTRAFINE III SHORT PEN 31G X 8 MM MISC USE AS DIRECTED FOUR TIMES DAILY   . calcitRIOL (ROCALTROL) 0.25 MCG capsule Take 0.25 mcg by mouth See admin instructions. Monday Thursday Sunday,  Pt takes every 3rd day   . carboxymethylcellulose (REFRESH PLUS) 0.5 % SOLN 1 drop 3 (three) times daily as needed. As needed   . carvedilol (COREG) 12.5 MG tablet Take 12.5 mg by mouth 2 (two) times daily with a meal.   . ciclopirox (LOPROX) 0.77 % cream Apply to both feet and between toes bid x 4 weeks (Patient not taking: Reported on 08/02/2019)   . clopidogrel (PLAVIX) 75 MG tablet Take 1 tablet (75 mg total) by mouth daily.   . ferrous sulfate (QC FERROUS SULFATE) 325 (65 FE) MG tablet Take 325 mg by mouth daily with breakfast.   .  furosemide (LASIX) 40 MG tablet Take 40 mg by mouth daily. 08/02/2019: Uses 3x per week  . glucose blood (TRUE METRIX BLOOD GLUCOSE TEST) test strip 1 each by Other route as needed for other. Use as directed to check blood sugars   . HYDROcodone-acetaminophen (NORCO) 5-325 MG tablet Take 1 tablet by mouth every 6 (six) hours as needed for moderate pain.   . Insulin Degludec (TRESIBA FLEXTOUCH) 200 UNIT/ML SOPN Inject 22 Units into the skin daily.   . Insulin Pen Needle 32G X 8 MM MISC Use as directed   . Multiple Vitamins-Minerals (MULTIVITAMIN ADULT PO) Take by mouth daily. 08/02/2019: Marca AnconaCelesta Gentile Omega Men  . Omega-3 Fatty Acids (FISH OIL OMEGA-3 PO) Take 750 mg by mouth daily. 08/02/2019: Taking OTC  . omeprazole (PRILOSEC) 40 MG capsule TAKE 1 CAPSULE BY MOUTH EVERY DAY BEFORE A MEAL 08/02/2019: Uses 4x per week  . rosuvastatin (CRESTOR) 20 MG tablet One tab po qd M-F ONLY   . saw palmetto 500 MG capsule Take by mouth daily. 08/02/2019: Pt unaware of dose  . sodium bicarbonate 650 MG tablet Take 1,300 mg by mouth 2 (two) times daily.   . TRUEPLUS LANCETS 30G MISC USE AS DIRECTED TO CHECK BLOOD SUGAR THREE TIMES DAILY    No facility-administered encounter medications on file as of 09/04/2019.     Objective:   Goals Addressed  This Visit's Progress     Patient Stated   . I would like to manage my chronic disease states (pt-stated)       Current Barriers:  . Diabetes: Slightly uncontrolled; complicated by chronic medical conditions including HTN, most recent A1c 8.1% . Patient states his BG readings have been ~170. He states he has discontinued using bolus insulin. He is tolerating recent initiation of Ozempic - experienced slight nausea, fatigue - but is tolerating. He states he administered his first dose on 08/24/2019. He reports enjoying not administering as many injections througout the day. . Patient had questions regarding appropriate amount of time to check BG . Current  antihyperglycemic regimen: Tresiba 12 units daily, Ozempic 0.25 mg weekly (Thursdays) o Prior antihyperglycemic agents: dapagliflozin (patient reports sugar went up to 600), linagliptin (unsure), sitagliptin (switched to [place]) . Denies hypoglycemic symptoms . Denies hyperglycemic symptoms . Current meal patterns: eats about 3 meals per day o Breakfast: oatmeal, cream of wheat, bacon, cheerios  o Lunch: soup, sandwich o Dinner: lamb chops, pork chops, beef stew, broccoli, cauliflower, collard greens, etc  o Snacks: does not typically eat snacks  o Drinks: water (mostly), ginger ale, pepsi, iced tea, not too many juices  . Current exercise: has not been able to go to [facility] due to COVID-19, occasionally walks to the park  . Current blood glucose readings:  o Before Breakfast: 84 - 161  o Before Lunch: 112 - 144 o Before Dinner: 92 - 140s . Cardiovascular risk reduction: o Current hypertensive regimen: amlodipine 5 mg daily and carvedilol 12.5 mg daily o Current hyperlipidemia regimen: rosuvastatin 20 mg daily  Pharmacist Clinical Goal(s):  Marland Kitchen Over the next 90 days, patient will work with PharmD and primary care provider to address optimization of DM regimen  Interventions: Call completed on 09/04/19 . Comprehensive medication review performed, medication list updated in electronic medical record . BG are elevated which are likely attributed to recent initiation of Ozempic. It is likely BG reading swill continue to lower the longer he takes [place] and continues [place]. Continue current DM regimen and checking BG readings consistently. Will follow up in 2 weeks. . Patient had questions regarding appropriate amount of time to check BG - recommended to check fasting blood glucose daily. . Faxed PAP application for Ozempic and Tresiba  Patient Self Care Activities:  . Patient will check blood glucose daily, document, and provide at future appointments . Patient will focus on medication  adherence by continuing to take medications as prescribed . Patient will take medications as prescribed . Patient will contact provider with any episodes of hypoglycemia . Patient will report any questions or concerns to provider   Please see past updates related to this goal by clicking on the "Past Updates" button in the selected goal          Plan:   The care management team will reach out to the patient again over the next 30 days.   Provider Signature Regina Eck, PharmD, BCPS Clinical Pharmacist, [facility] [place]: [phone]

## 2019-09-04 NOTE — Patient Instructions (Signed)
Visit Information  Goals Addressed            This Visit's Progress     Patient Stated   . I would like to manage my chronic disease states (pt-stated)       Current Barriers:  . Diabetes: Slightly uncontrolled; complicated by chronic medical conditions including HTN, most recent A1c 8.1% . Patient states his BG readings have been ~170. He states he has discontinued using bolus insulin. He is tolerating recent initiation of Ozempic - experienced slight nausea, fatigue - but is tolerating. He states he administered his first dose on 08/24/2019. He reports enjoying not administering as many injections througout the day. . Patient had questions regarding appropriate amount of time to check BG . Current antihyperglycemic regimen: Tresiba 12 units daily, Ozempic 0.25 mg weekly (Thursdays) o Prior antihyperglycemic agents: dapagliflozin (patient reports sugar went up to 600), linagliptin (unsure), sitagliptin (switched to [place]) . Denies hypoglycemic symptoms . Denies hyperglycemic symptoms . Current meal patterns: eats about 3 meals per day o Breakfast: oatmeal, cream of wheat, bacon, cheerios  o Lunch: soup, sandwich o Dinner: lamb chops, pork chops, beef stew, broccoli, cauliflower, collard greens, etc  o Snacks: does not typically eat snacks  o Drinks: water (mostly), ginger ale, pepsi, iced tea, not too many juices  . Current exercise: has not been able to go to [facility] due to COVID-19, occasionally walks to the park  . Current blood glucose readings:  o Before Breakfast: 84 - 161  o Before Lunch: 112 - 144 o Before Dinner: 92 - 140s . Cardiovascular risk reduction: o Current hypertensive regimen: amlodipine 5 mg daily and carvedilol 12.5 mg daily o Current hyperlipidemia regimen: rosuvastatin 20 mg daily  Pharmacist Clinical Goal(s):  Marland Kitchen Over the next 90 days, patient will work with PharmD and primary care provider to address optimization of DM regimen  Interventions: Call  completed on 09/04/19 . Comprehensive medication review performed, medication list updated in electronic medical record . BG are elevated which are likely attributed to recent initiation of Ozempic. It is likely BG reading swill continue to lower the longer he takes [place] and continues [place]. Continue current DM regimen and checking BG readings consistently. Will follow up in 2 weeks. . Patient had questions regarding appropriate amount of time to check BG - recommended to check fasting blood glucose daily. . Faxed PAP application for Ozempic and Tresiba  Patient Self Care Activities:  . Patient will check blood glucose daily, document, and provide at future appointments . Patient will focus on medication adherence by continuing to take medications as prescribed . Patient will take medications as prescribed . Patient will contact provider with any episodes of hypoglycemia . Patient will report any questions or concerns to provider   Please see past updates related to this goal by clicking on the "Past Updates" button in the selected goal          The patient verbalized understanding of instructions provided today and declined a print copy of patient instruction materials.   The care management team will reach out to the patient again over the next 30 days.   SIGNATURE Regina Eck, PharmD, BCPS Clinical Pharmacist, [facility] [place]: [phone]

## 2019-09-05 ENCOUNTER — Ambulatory Visit (HOSPITAL_COMMUNITY)
Admission: RE | Admit: 2019-09-05 | Discharge: 2019-09-05 | Disposition: A | Discharge status: Home / Self Care | Payer: Medicare Other | Source: Ambulatory Visit | Attending: Nephrology | Admitting: Nephrology

## 2019-09-05 ENCOUNTER — Other Ambulatory Visit: Payer: Self-pay

## 2019-09-05 VITALS — BP 112/60 | Temp 97.1°F | Resp 20

## 2019-09-05 DIAGNOSIS — N289 Disorder of kidney and ureter, unspecified: Secondary | ICD-10-CM | POA: Diagnosis not present

## 2019-09-05 LAB — POCT HEMOGLOBIN-HEMACUE: Hemoglobin: 9 g/dL — ABNORMAL LOW (ref 13.0–17.0)

## 2019-09-05 MED ORDER — EPOETIN ALFA 20000 UNIT/ML IJ SOLN
INTRAMUSCULAR | Status: AC
  Administered 2019-09-05: 20000 [IU] via SUBCUTANEOUS
  Filled 2019-09-05: qty 1

## 2019-09-05 MED ORDER — EPOETIN ALFA 40000 UNIT/ML IJ SOLN: 30000.0000 [IU] | INTRAMUSCULAR | Status: DC

## 2019-09-05 MED ORDER — EPOETIN ALFA 10000 UNIT/ML IJ SOLN
INTRAMUSCULAR | Status: AC
  Administered 2019-09-05: 10000 [IU] via SUBCUTANEOUS
  Filled 2019-09-05: qty 1

## 2019-09-06 ENCOUNTER — Other Ambulatory Visit: Payer: Self-pay | Admitting: Pharmacy Technician

## 2019-09-06 NOTE — Patient Outreach (Signed)
[place] [facility]) Care Management  09/06/2019  Alec Solis 1933-01-06 [id number]   Received patient and provider portion(s) of patient assistance application(s) for Ozempic and [place]. Faxed completed application and required documents into [employer].  Will follow up with company(ies) in 10-14 business days to check status of application(s).  Maud Deed Chana Bode [place] Certified Pharmacy Technician [place] Management Direct Dial:[phone]

## 2019-09-12 ENCOUNTER — Ambulatory Visit (INDEPENDENT_AMBULATORY_CARE_PROVIDER_SITE_OTHER): Payer: Medicare Other | Admitting: Podiatry

## 2019-09-12 ENCOUNTER — Encounter: Payer: Self-pay | Admitting: Podiatry

## 2019-09-12 ENCOUNTER — Other Ambulatory Visit: Payer: Self-pay

## 2019-09-12 DIAGNOSIS — M79674 Pain in right toe(s): Secondary | ICD-10-CM | POA: Diagnosis not present

## 2019-09-12 DIAGNOSIS — B351 Tinea unguium: Secondary | ICD-10-CM

## 2019-09-12 DIAGNOSIS — M79675 Pain in left toe(s): Secondary | ICD-10-CM | POA: Diagnosis not present

## 2019-09-12 DIAGNOSIS — E119 Type 2 diabetes mellitus without complications: Secondary | ICD-10-CM | POA: Diagnosis not present

## 2019-09-12 DIAGNOSIS — L84 Corns and callosities: Secondary | ICD-10-CM

## 2019-09-12 DIAGNOSIS — Z794 Long term (current) use of insulin: Secondary | ICD-10-CM

## 2019-09-15 ENCOUNTER — Other Ambulatory Visit: Payer: Self-pay | Admitting: Pharmacy Technician

## 2019-09-15 NOTE — Patient Outreach (Signed)
[place] [facility]) Care Management  09/15/2019  Alec Solis June 20, 1933 [id number]    Follow up call placed to [employer] regarding patient assistance application(s) for Ozempic and [name] confirms patient has been approved as of 1/14 until 08/23/2020. Medication to arrive at providers office in the next 5-7 business days.  Follow up:  Will remove myself from care team and  inform [name] and [name] @ [facility].  Maud Deed Chana Bode [place] Certified Pharmacy Technician [place] Management Direct Dial:[phone]

## 2019-09-17 NOTE — Progress Notes (Signed)
Subjective: Alec Solis presents today for follow up of preventative diabetic foot care: and painful mycotic nails b/l that are difficult to trim. Pain interferes with ambulation. Aggravating factors include wearing enclosed shoe gear. Pain is relieved with periodic professional debridement.   PCP: [name], MD; last visit 08/07/2019.  No Known Allergies   Objective: There were no vitals filed for this visit.  Vascular Examination:  capillary refill time to digits immediate b/l, palpable DP pulses b/l, palpable PT pulses b/l, pedal hair absent b/l and skin temperature gradient within normal limits b/l  Dermatological Examination: Pedal skin with normal turgor, texture and tone bilaterally, no open wounds bilaterally, no interdigital macerations bilaterally., toenails 1-5 b/l elongated, dystrophic, thickened, crumbly with subungual debris and Hyperkeratotic lesion(s) L 2nd digit. No erythema, no edema, no drainage, no flocculence.  Musculoskeletal: normal muscle strength 5/5 to all lower extremity muscle groups bilaterally, no gross bony deformities bilaterally, no pain crepitus or joint limitation noted with ROM, bunion deformity noted B and hammertoes noted to the left, right, 2nd toe, 3rd toe, 4th toe, 5th toe  Neurological: sensation intact 5/5 intact bilaterally with 10g monofilament and vibratory sensation intact  Assessment: 1. Pain due to onychomycosis of toenails of both feet   2. Corns   3. Controlled type 2 diabetes mellitus without complication, without long-term current use of insulin ([place])      Plan: Continue diabetic foot care principles. Literature dispensed on today.  -Toenails 1-5 b/l were debrided in length and girth without iatrogenic bleeding. -The above corns and calluses were debrided without complication or incident. Total number debrided =1 left 2nd digit -Patient to continue soft, supportive shoe gear daily. -Patient to report any pedal injuries to  medical professional immediately. -Patient/POA to call should there be question/concern in the interim.  No follow-ups on file.

## 2019-09-26 ENCOUNTER — Other Ambulatory Visit: Payer: Self-pay | Admitting: Pharmacy Technician

## 2019-09-26 ENCOUNTER — Telehealth: Payer: Self-pay

## 2019-09-26 ENCOUNTER — Ambulatory Visit (HOSPITAL_COMMUNITY)
Admission: RE | Admit: 2019-09-26 | Discharge: 2019-09-26 | Disposition: A | Discharge status: Home / Self Care | Payer: Medicare Other | Source: Ambulatory Visit | Attending: Nephrology | Admitting: Nephrology

## 2019-09-26 ENCOUNTER — Other Ambulatory Visit: Payer: Self-pay

## 2019-09-26 VITALS — BP 115/77 | HR 79 | Temp 96.0°F | Resp 20

## 2019-09-26 DIAGNOSIS — N289 Disorder of kidney and ureter, unspecified: Secondary | ICD-10-CM

## 2019-09-26 DIAGNOSIS — N183 Chronic kidney disease, stage 3 unspecified: Secondary | ICD-10-CM | POA: Insufficient documentation

## 2019-09-26 DIAGNOSIS — D631 Anemia in chronic kidney disease: Secondary | ICD-10-CM | POA: Diagnosis not present

## 2019-09-26 LAB — RENAL FUNCTION PANEL
Albumin: 3.7 g/dL (ref 3.5–5.0)
Anion gap: 11 (ref 5–15)
BUN: 33 mg/dL — ABNORMAL HIGH (ref 8–23)
CO2: 20 mmol/L — ABNORMAL LOW (ref 22–32)
Calcium: 9.7 mg/dL (ref 8.9–10.3)
Chloride: 108 mmol/L (ref 98–111)
Creatinine, Ser: 1.89 mg/dL — ABNORMAL HIGH (ref 0.61–1.24)
GFR calc Af Amer: 37 mL/min — ABNORMAL LOW (ref >60)
GFR calc non Af Amer: 32 mL/min — ABNORMAL LOW (ref >60)
Glucose, Bld: 220 mg/dL — ABNORMAL HIGH (ref 70–99)
Phosphorus: 2.5 mg/dL (ref 2.5–4.6)
Potassium: 4.4 mmol/L (ref 3.5–5.1)
Sodium: 139 mmol/L (ref 135–145)

## 2019-09-26 LAB — IRON AND TIBC
Iron: 61 ug/dL (ref 45–182)
Saturation Ratios: 19 % (ref 17.9–39.5)
TIBC: 322 ug/dL (ref 250–450)
UIBC: 261 ug/dL

## 2019-09-26 LAB — FERRITIN: Ferritin: 755 ng/mL — ABNORMAL HIGH (ref 24–336)

## 2019-09-26 LAB — POCT HEMOGLOBIN-HEMACUE: Hemoglobin: 9.5 g/dL — ABNORMAL LOW (ref 13.0–17.0)

## 2019-09-26 MED ORDER — EPOETIN ALFA 10000 UNIT/ML IJ SOLN
INTRAMUSCULAR | Status: AC
  Administered 2019-09-26: 10000 [IU]
  Filled 2019-09-26: qty 1

## 2019-09-26 MED ORDER — EPOETIN ALFA 40000 UNIT/ML IJ SOLN: 30000.0000 [IU] | INTRAMUSCULAR | Status: DC

## 2019-09-26 MED ORDER — EPOETIN ALFA 20000 UNIT/ML IJ SOLN
INTRAMUSCULAR | Status: AC
  Administered 2019-09-26: 20000 [IU]
  Filled 2019-09-26: qty 1

## 2019-09-26 NOTE — Telephone Encounter (Signed)
Called pt to let him know his medication is ready for pick-up pt wife stated she will let him know

## 2019-09-26 NOTE — Patient Outreach (Signed)
[place] [facility]) Care Management  09/26/2019  Lin Krook 19-Nov-1932 [id number]   Follow up call placed to [employer] to check shipping status of Ozempic and Tresiba. Lovey Newcomer states medication was delivered to provider's office on 2/2 @ 9:16am.  Will route note to [place] to inform.  Maud Deed Chana Bode [place] Certified Pharmacy Technician [place] Management Direct Dial:[phone]

## 2019-09-27 ENCOUNTER — Telehealth: Payer: Self-pay

## 2019-09-27 ENCOUNTER — Telehealth: Payer: Medicare Other | Admitting: Pharmacist

## 2019-09-27 LAB — PTH, INTACT AND CALCIUM
Calcium, Total (PTH): 9.4 mg/dL (ref 8.6–10.2)
PTH: 40 pg/mL (ref 15–65)

## 2019-09-27 NOTE — Telephone Encounter (Signed)
I called patient and advised him that his pen needles are here and ready to be picked up. YRL,RMA

## 2019-10-02 ENCOUNTER — Ambulatory Visit (INDEPENDENT_AMBULATORY_CARE_PROVIDER_SITE_OTHER): Payer: Medicare Other | Admitting: Pharmacist

## 2019-10-02 DIAGNOSIS — E1122 Type 2 diabetes mellitus with diabetic chronic kidney disease: Secondary | ICD-10-CM

## 2019-10-02 DIAGNOSIS — N183 Chronic kidney disease, stage 3 unspecified: Secondary | ICD-10-CM

## 2019-10-04 NOTE — Progress Notes (Signed)
Chronic Care Management    Visit Note  10/02/2019 Name: Alec Solis MRN: [id number] DOB: 04-16-1933  Referred by: [name], MD Reason for referral : Chronic Care Management (DIABETES)   Alec Solis is a 86 y.o. year old male who is a primary care patient of [name], MD. The CCM team was consulted for assistance with chronic disease management and care coordination needs related to DMII  Review of patient status, including review of consultants reports, relevant laboratory and other test results, and collaboration with appropriate care team members and the patient's provider was performed as part of comprehensive patient evaluation and provision of chronic care management services.      Medications: Outpatient Encounter Medications as of 10/02/2019  Medication Sig Note  . acetaminophen (ACETAMINOPHEN 8 HOUR) 650 MG CR tablet Take 650 mg by mouth daily.   Marland Kitchen allopurinol (ZYLOPRIM) 100 MG tablet TAKE 1 TABLET BY MOUTH TWICE DAILY   . amLODipine (NORVASC) 5 MG tablet TAKE 1 TABLET BY MOUTH DAILY   . aspirin 81 MG tablet Take 81 mg by mouth daily.   . B-D ULTRAFINE III SHORT PEN 31G X 8 MM MISC USE AS DIRECTED FOUR TIMES DAILY   . calcitRIOL (ROCALTROL) 0.25 MCG capsule Take 0.25 mcg by mouth See admin instructions. Monday Thursday Sunday,  Pt takes every 3rd day   . carboxymethylcellulose (REFRESH PLUS) 0.5 % SOLN 1 drop 3 (three) times daily as needed. As needed   . carvedilol (COREG) 12.5 MG tablet Take 12.5 mg by mouth 2 (two) times daily with a meal.   . ciclopirox (LOPROX) 0.77 % cream Apply to both feet and between toes bid x 4 weeks   . clopidogrel (PLAVIX) 75 MG tablet Take 1 tablet (75 mg total) by mouth daily.   . ferrous sulfate (QC FERROUS SULFATE) 325 (65 FE) MG tablet Take 325 mg by mouth daily with breakfast.   . furosemide (LASIX) 40 MG tablet Take 40 mg by mouth daily. 08/02/2019: Uses 3x per week  . glucose blood (TRUE METRIX BLOOD GLUCOSE TEST) test strip 1  each by Other route as needed for other. Use as directed to check blood sugars   . HYDROcodone-acetaminophen (NORCO) 5-325 MG tablet Take 1 tablet by mouth every 6 (six) hours as needed for moderate pain.   . Insulin Degludec (TRESIBA FLEXTOUCH) 200 UNIT/ML SOPN Inject 22 Units into the skin daily.   . Insulin Pen Needle 32G X 8 MM MISC Use as directed   . Multiple Vitamins-Minerals (MULTIVITAMIN ADULT PO) Take by mouth daily. 08/02/2019: Marca AnconaCelesta Gentile Omega Men  . Omega-3 Fatty Acids (FISH OIL OMEGA-3 PO) Take 750 mg by mouth daily. 08/02/2019: Taking OTC  . omeprazole (PRILOSEC) 40 MG capsule TAKE 1 CAPSULE BY MOUTH EVERY DAY BEFORE A MEAL 08/02/2019: Uses 4x per week  . rosuvastatin (CRESTOR) 20 MG tablet One tab po qd M-F ONLY   . saw palmetto 500 MG capsule Take by mouth daily. 08/02/2019: Pt unaware of dose  . Semaglutide,0.25 or 0.5MG /DOS, (OZEMPIC, 0.25 OR 0.5 MG/DOSE,) 2 MG/1.5ML SOPN Inject 0.5 mg into the skin once a week.   . sodium bicarbonate 650 MG tablet Take 1,300 mg by mouth 2 (two) times daily.   . TRUEPLUS LANCETS 30G MISC USE AS DIRECTED TO CHECK BLOOD SUGAR THREE TIMES DAILY   . UNABLE TO FIND USE UTD TO TEST BS TID    No facility-administered encounter medications on file as of 10/02/2019.     Objective:  Goals Addressed            This Visit's Progress     Patient Stated   . I would like to manage my chronic disease states (pt-stated)       Current Barriers:  . Diabetes: Slightly uncontrolled; complicated by chronic medical conditions including HTN, most recent A1c 8.1% . Patient states his BG readings have been ~170. He states he has discontinued using bolus insulin. e reports enjoying not administering as many injections througout the day. . Patient had questions regarding appropriate amount of time to check BG . Current antihyperglycemic regimen: Tresiba 12 units daily,  o HOLDING Ozempic 0.25 mg weekly (Thursdays) DUE TO NAUSEA/VOMITING --WILL HOLD DOSE ON 2/11 TO  DETERMINE FUTURE STEPS o Prior antihyperglycemic agents: dapagliflozin (patient reports sugar went up to 600), linagliptin (unsure), sitagliptin (switched to [place]) . Denies hypoglycemic symptoms . Denies hyperglycemic symptoms . Current meal patterns: eats about 3 meals per day o Breakfast: oatmeal, cream of wheat, bacon, cheerios  o Lunch: soup, sandwich o Dinner: lamb chops, pork chops, beef stew, broccoli, cauliflower, collard greens, etc  o Snacks: does not typically eat snacks  o Drinks: water (mostly), ginger ale, pepsi, iced tea, not too many juices  . Current exercise: has not been able to go to [facility] due to COVID-19, occasionally walks to the park  . Current blood glucose readings:  o Before Breakfast: 84 - 161  o Before Lunch: 112 - 144 o Before Dinner: 92 - 140s . Cardiovascular risk reduction: o Current hypertensive regimen: amlodipine 5 mg daily and carvedilol 12.5 mg daily o Current hyperlipidemia regimen: rosuvastatin 20 mg daily  Pharmacist Clinical Goal(s):  Marland Kitchen Over the next 90 days, patient will work with PharmD and primary care provider to address optimization of DM regimen  Interventions: Call completed on 10/02/19 . Comprehensive medication review performed, medication list updated in electronic medical record . BG are elevated which are likely attributed to recent initiation of Ozempic. It is likely BG reading swill continue to lower the longer he takes [place] and continues [place]. Continue current DM regimen and checking BG readings consistently. Will follow up in 2 weeks. . Patient had questions regarding appropriate amount of time to check BG - recommended to check fasting blood glucose daily. . Faxed PAP application for Ozempic and Tresiba  Patient Self Care Activities:  . Patient will check blood glucose daily, document, and provide at future appointments . Patient will focus on medication adherence by continuing to take medications as prescribed . Patient  will take medications as prescribed . Patient will contact provider with any episodes of hypoglycemia . Patient will report any questions or concerns to provider   Please see past updates related to this goal by clicking on the "Past Updates" button in the selected goal          Plan:   The care management team will reach out to the patient again over the next 14 days.   Provider Signature Regina Eck, PharmD, BCPS Clinical Pharmacist, [facility] [place]: [phone]

## 2019-10-04 NOTE — Patient Instructions (Signed)
Visit Information  Goals Addressed            This Visit's Progress     Patient Stated   . I would like to manage my chronic disease states (pt-stated)       Current Barriers:  . Diabetes: Slightly uncontrolled; complicated by chronic medical conditions including HTN, most recent A1c 8.1% . Patient states his BG readings have been ~170. He states he has discontinued using bolus insulin. e reports enjoying not administering as many injections througout the day. . Patient had questions regarding appropriate amount of time to check BG . Current antihyperglycemic regimen: Tresiba 12 units daily,  o HOLDING Ozempic 0.25 mg weekly (Thursdays) DUE TO NAUSEA/VOMITING --WILL HOLD DOSE ON 2/11 TO DETERMINE FUTURE STEPS o Prior antihyperglycemic agents: dapagliflozin (patient reports sugar went up to 600), linagliptin (unsure), sitagliptin (switched to [place]) . Denies hypoglycemic symptoms . Denies hyperglycemic symptoms . Current meal patterns: eats about 3 meals per day o Breakfast: oatmeal, cream of wheat, bacon, cheerios  o Lunch: soup, sandwich o Dinner: lamb chops, pork chops, beef stew, broccoli, cauliflower, collard greens, etc  o Snacks: does not typically eat snacks  o Drinks: water (mostly), ginger ale, pepsi, iced tea, not too many juices  . Current exercise: has not been able to go to [facility] due to COVID-19, occasionally walks to the park  . Current blood glucose readings:  o Before Breakfast: 84 - 161  o Before Lunch: 112 - 144 o Before Dinner: 92 - 140s . Cardiovascular risk reduction: o Current hypertensive regimen: amlodipine 5 mg daily and carvedilol 12.5 mg daily o Current hyperlipidemia regimen: rosuvastatin 20 mg daily  Pharmacist Clinical Goal(s):  Marland Kitchen Over the next 90 days, patient will work with PharmD and primary care provider to address optimization of DM regimen  Interventions: Call completed on 10/02/19 . Comprehensive medication review performed, medication list  updated in electronic medical record . BG are elevated which are likely attributed to recent initiation of Ozempic. It is likely BG reading swill continue to lower the longer he takes [place] and continues [place]. Continue current DM regimen and checking BG readings consistently. Will follow up in 2 weeks. . Patient had questions regarding appropriate amount of time to check BG - recommended to check fasting blood glucose daily. . Faxed PAP application for Ozempic and Tresiba  Patient Self Care Activities:  . Patient will check blood glucose daily, document, and provide at future appointments . Patient will focus on medication adherence by continuing to take medications as prescribed . Patient will take medications as prescribed . Patient will contact provider with any episodes of hypoglycemia . Patient will report any questions or concerns to provider   Please see past updates related to this goal by clicking on the "Past Updates" button in the selected goal          The patient verbalized understanding of instructions provided today and declined a print copy of patient instruction materials.   The care management team will reach out to the patient again over the next 14 days.   SIGNATURE Regina Eck, PharmD, BCPS Clinical Pharmacist, [facility] [place]: [phone]

## 2019-10-09 ENCOUNTER — Other Ambulatory Visit (HOSPITAL_COMMUNITY): Payer: Self-pay | Admitting: *Deleted

## 2019-10-09 ENCOUNTER — Ambulatory Visit: Payer: Self-pay | Admitting: Pharmacist

## 2019-10-09 DIAGNOSIS — E1122 Type 2 diabetes mellitus with diabetic chronic kidney disease: Secondary | ICD-10-CM | POA: Diagnosis not present

## 2019-10-09 DIAGNOSIS — N183 Chronic kidney disease, stage 3 unspecified: Secondary | ICD-10-CM | POA: Diagnosis not present

## 2019-10-09 NOTE — Patient Instructions (Signed)
Chronic Care Management   Visit Note  10/09/2019 Name: Alec Solis MRN: [id number] DOB: 1933-04-26  Referred by: Alec Chard, MD Reason for referral : Chronic Care Management   Alec Solis is a 86 y.o. year old male who is a primary care patient of Alec Chard, MD. The CCM team was consulted for assistance with chronic disease management and care coordination needs related to DMII  Review of patient status, including review of consultants reports, relevant laboratory and other test results, and collaboration with appropriate care team members and the patient's provider was performed as part of comprehensive patient evaluation and provision of chronic care management services.    Medications: Outpatient Encounter Medications as of 10/09/2019  Medication Sig Note  . acetaminophen (ACETAMINOPHEN 8 HOUR) 650 MG CR tablet Take 650 mg by mouth daily.   Marland Kitchen allopurinol (ZYLOPRIM) 100 MG tablet TAKE 1 TABLET BY MOUTH TWICE DAILY   . amLODipine (NORVASC) 5 MG tablet TAKE 1 TABLET BY MOUTH DAILY   . aspirin 81 MG tablet Take 81 mg by mouth daily.   . B-D ULTRAFINE III SHORT PEN 31G X 8 MM MISC USE AS DIRECTED FOUR TIMES DAILY   . calcitRIOL (ROCALTROL) 0.25 MCG capsule Take 0.25 mcg by mouth See admin instructions. Monday Thursday Sunday,  Pt takes every 3rd day   . carboxymethylcellulose (REFRESH PLUS) 0.5 % SOLN 1 drop 3 (three) times daily as needed. As needed   . carvedilol (COREG) 12.5 MG tablet Take 12.5 mg by mouth 2 (two) times daily with a meal.   . ciclopirox (LOPROX) 0.77 % cream Apply to both feet and between toes bid x 4 weeks   . clopidogrel (PLAVIX) 75 MG tablet Take 1 tablet (75 mg total) by mouth daily.   . ferrous sulfate (QC FERROUS SULFATE) 325 (65 FE) MG tablet Take 325 mg by mouth daily with breakfast.   . furosemide (LASIX) 40 MG tablet Take 40 mg by mouth daily. 08/02/2019: Uses 3x per week  . glucose blood (TRUE METRIX BLOOD GLUCOSE TEST) test strip 1 each by Other  route as needed for other. Use as directed to check blood sugars   . HYDROcodone-acetaminophen (NORCO) 5-325 MG tablet Take 1 tablet by mouth every 6 (six) hours as needed for moderate pain.   . Insulin Degludec (TRESIBA FLEXTOUCH) 200 UNIT/ML SOPN Inject 22 Units into the skin daily.   . Insulin Pen Needle 32G X 8 MM MISC Use as directed   . Multiple Vitamins-Minerals (MULTIVITAMIN ADULT PO) Take by mouth daily. 08/02/2019: Marca AnconaCelesta Gentile Omega Men  . Omega-3 Fatty Acids (FISH OIL OMEGA-3 PO) Take 750 mg by mouth daily. 08/02/2019: Taking OTC  . omeprazole (PRILOSEC) 40 MG capsule TAKE 1 CAPSULE BY MOUTH EVERY DAY BEFORE A MEAL 08/02/2019: Uses 4x per week  . rosuvastatin (CRESTOR) 20 MG tablet One tab po qd M-F ONLY   . saw palmetto 500 MG capsule Take by mouth daily. 08/02/2019: Pt unaware of dose  . Semaglutide,0.25 or 0.5MG /DOS, (OZEMPIC, 0.25 OR 0.5 MG/DOSE,) 2 MG/1.5ML SOPN Inject 0.5 mg into the skin once a week.   . sodium bicarbonate 650 MG tablet Take 1,300 mg by mouth 2 (two) times daily.   . TRUEPLUS LANCETS 30G MISC USE AS DIRECTED TO CHECK BLOOD SUGAR THREE TIMES DAILY   . UNABLE TO FIND USE UTD TO TEST BS TID    No facility-administered encounter medications on file as of 10/09/2019.     Objective:   Goals Addressed  This Visit's Progress     Patient Stated   . I would like to manage my chronic disease states (pt-stated)       Current Barriers:  . Diabetes: Slightly uncontrolled; complicated by chronic medical conditions including HTN, most recent A1c 8.1% . 10/09/19: Patient states his BG readings have been <150, >90. He states he has discontinued using bolus insulin. He reports enjoying not administering as many injections througout the day. . Patient had questions regarding appropriate amount of time to check BG . Current antihyperglycemic regimen: Tresiba 12 units daily  o HOLDING Ozempic 0.25 mg weekly (Thursdays) DUE TO NAUSEA/VOMITING --WILL HOLD DOSE ON 2/11  TO DETERMINE FUTURE STEPS, continue to hold ozempic - Medications obtained from [employer] patient assistance, however patient states that [place] was not included; samples provided until patient is eligible for refill.  Obtained ozempic and pen needles o Prior antihyperglycemic agents: dapagliflozin (patient reports sugar went up to 600), linagliptin (unsure), sitagliptin (switched to [place]) . Denies hypoglycemic symptoms . Denies hyperglycemic symptoms . Current meal patterns: eats about 3 meals per day o Breakfast: oatmeal, cream of wheat, bacon, cheerios  o Lunch: soup, sandwich o Dinner: lamb chops, pork chops, beef stew, broccoli, cauliflower, collard greens, etc  o Snacks: does not typically eat snacks  o Drinks: water (mostly), ginger ale, pepsi, iced tea, not too many juices  . Current exercise: has not been able to go to [facility] due to COVID-19, occasionally walks to the park  . Current blood glucose readings:  o Before Breakfast: 84 - 161  o Before Lunch: 112 - 144 o Before Dinner: 92 - 140s . Cardiovascular risk reduction: o Current hypertensive regimen: amlodipine 5 mg daily and carvedilol 12.5 mg daily o Current hyperlipidemia regimen: rosuvastatin 20 mg daily  Pharmacist Clinical Goal(s):  Marland Kitchen Over the next 90 days, patient will work with PharmD and primary care provider to address optimization of DM regimen  Interventions: Call completed on 10/02/19 . Comprehensive medication review performed, medication list updated in electronic medical record . BG are elevated which are likely attributed to recent initiation of Ozempic. It is likely BG reading swill continue to lower the longer he takes [place] and continues [place]. Continue current DM regimen and checking BG readings consistently. Will follow up in 2 weeks. . Patient had questions regarding appropriate amount of time to check BG - recommended to check fasting blood glucose daily. . Faxed PAP application for Ozempic and Tresiba   Patient Self Care Activities:  . Patient will check blood glucose daily, document, and provide at future appointments . Patient will focus on medication adherence by continuing to take medications as prescribed . Patient will take medications as prescribed . Patient will contact provider with any episodes of hypoglycemia . Patient will report any questions or concerns to provider   Please see past updates related to this goal by clicking on the "Past Updates" button in the selected goal            Plan:   The care management team will reach out to the patient again over the next 30 days.   Provider Signature Regina Eck, PharmD, BCPS Clinical Pharmacist, [facility] [place]: [phone]

## 2019-10-09 NOTE — Progress Notes (Signed)
Chronic Care Management    Visit Note  10/09/2019 Name: Alec Solis MRN: [id number] DOB: 06-Nov-1932  Referred by: [name], MD Reason for referral : Chronic Care Management   Alec Solis is a 86 y.o. year old male who is a primary care patient of [name], MD. The CCM team was consulted for assistance with chronic disease management and care coordination needs related to DMII  Review of patient status, including review of consultants reports, relevant laboratory and other test results, and collaboration with appropriate care team members and the patient's provider was performed as part of comprehensive patient evaluation and provision of chronic care management services.     Medications: Outpatient Encounter Medications as of 10/09/2019  Medication Sig Note  . acetaminophen (ACETAMINOPHEN 8 HOUR) 650 MG CR tablet Take 650 mg by mouth daily.   Marland Kitchen allopurinol (ZYLOPRIM) 100 MG tablet TAKE 1 TABLET BY MOUTH TWICE DAILY   . amLODipine (NORVASC) 5 MG tablet TAKE 1 TABLET BY MOUTH DAILY   . aspirin 81 MG tablet Take 81 mg by mouth daily.   . B-D ULTRAFINE III SHORT PEN 31G X 8 MM MISC USE AS DIRECTED FOUR TIMES DAILY   . calcitRIOL (ROCALTROL) 0.25 MCG capsule Take 0.25 mcg by mouth See admin instructions. Monday Thursday Sunday,  Pt takes every 3rd day   . carboxymethylcellulose (REFRESH PLUS) 0.5 % SOLN 1 drop 3 (three) times daily as needed. As needed   . carvedilol (COREG) 12.5 MG tablet Take 12.5 mg by mouth 2 (two) times daily with a meal.   . ciclopirox (LOPROX) 0.77 % cream Apply to both feet and between toes bid x 4 weeks   . clopidogrel (PLAVIX) 75 MG tablet Take 1 tablet (75 mg total) by mouth daily.   . ferrous sulfate (QC FERROUS SULFATE) 325 (65 FE) MG tablet Take 325 mg by mouth daily with breakfast.   . furosemide (LASIX) 40 MG tablet Take 40 mg by mouth daily. 08/02/2019: Uses 3x per week  . glucose blood (TRUE METRIX BLOOD GLUCOSE TEST) test strip 1 each by Other  route as needed for other. Use as directed to check blood sugars   . HYDROcodone-acetaminophen (NORCO) 5-325 MG tablet Take 1 tablet by mouth every 6 (six) hours as needed for moderate pain.   . Insulin Degludec (TRESIBA FLEXTOUCH) 200 UNIT/ML SOPN Inject 22 Units into the skin daily.   . Insulin Pen Needle 32G X 8 MM MISC Use as directed   . Multiple Vitamins-Minerals (MULTIVITAMIN ADULT PO) Take by mouth daily. 08/02/2019: Marca AnconaCelesta Gentile Omega Men  . Omega-3 Fatty Acids (FISH OIL OMEGA-3 PO) Take 750 mg by mouth daily. 08/02/2019: Taking OTC  . omeprazole (PRILOSEC) 40 MG capsule TAKE 1 CAPSULE BY MOUTH EVERY DAY BEFORE A MEAL 08/02/2019: Uses 4x per week  . rosuvastatin (CRESTOR) 20 MG tablet One tab po qd M-F ONLY   . saw palmetto 500 MG capsule Take by mouth daily. 08/02/2019: Pt unaware of dose  . Semaglutide,0.25 or 0.5MG /DOS, (OZEMPIC, 0.25 OR 0.5 MG/DOSE,) 2 MG/1.5ML SOPN Inject 0.5 mg into the skin once a week.   . sodium bicarbonate 650 MG tablet Take 1,300 mg by mouth 2 (two) times daily.   . TRUEPLUS LANCETS 30G MISC USE AS DIRECTED TO CHECK BLOOD SUGAR THREE TIMES DAILY   . UNABLE TO FIND USE UTD TO TEST BS TID    No facility-administered encounter medications on file as of 10/09/2019.     Objective:   Goals Addressed  This Visit's Progress     Patient Stated   . I would like to manage my chronic disease states (pt-stated)       Current Barriers:  . Diabetes: Slightly uncontrolled; complicated by chronic medical conditions including HTN, most recent A1c 8.1% . 10/09/19: Patient states his BG readings have been <150, >90. He states he has discontinued using bolus insulin. He reports enjoying not administering as many injections througout the day. . Patient had questions regarding appropriate amount of time to check BG . Current antihyperglycemic regimen: Tresiba 12 units daily  o HOLDING Ozempic 0.25 mg weekly (Thursdays) DUE TO NAUSEA/VOMITING --WILL HOLD DOSE ON 2/11  TO DETERMINE FUTURE STEPS, continue to hold ozempic - Medications obtained from [employer] patient assistance, however patient states that [place] was not included; samples provided until patient is eligible for refill.  Obtained ozempic and pen needles o Prior antihyperglycemic agents: dapagliflozin (patient reports sugar went up to 600), linagliptin (unsure), sitagliptin (switched to [place]) . Denies hypoglycemic symptoms . Denies hyperglycemic symptoms . Current meal patterns: eats about 3 meals per day o Breakfast: oatmeal, cream of wheat, bacon, cheerios  o Lunch: soup, sandwich o Dinner: lamb chops, pork chops, beef stew, broccoli, cauliflower, collard greens, etc  o Snacks: does not typically eat snacks  o Drinks: water (mostly), ginger ale, pepsi, iced tea, not too many juices  . Current exercise: has not been able to go to [facility] due to COVID-19, occasionally walks to the park  . Current blood glucose readings:  o Before Breakfast: 84 - 161  o Before Lunch: 112 - 144 o Before Dinner: 92 - 140s . Cardiovascular risk reduction: o Current hypertensive regimen: amlodipine 5 mg daily and carvedilol 12.5 mg daily o Current hyperlipidemia regimen: rosuvastatin 20 mg daily  Pharmacist Clinical Goal(s):  Marland Kitchen Over the next 90 days, patient will work with PharmD and primary care provider to address optimization of DM regimen  Interventions: Call completed on 10/09/19 . Comprehensive medication review performed, medication list updated in electronic medical record . Continue current DM regimen (tresiba only) and checking BG readings consistently. Will follow up in 1 month . Patient had questions regarding appropriate amount of time to check BG - recommended to check fasting blood glucose daily.  Patient Self Care Activities:  . Patient will check blood glucose daily, document, and provide at future appointments . Patient will focus on medication adherence by continuing to take medications as  prescribed . Patient will take medications as prescribed . Patient will contact provider with any episodes of hypoglycemia . Patient will report any questions or concerns to provider   Please see past updates related to this goal by clicking on the "Past Updates" button in the selected goal           Plan:   The care management team will reach out to the patient again over the next 30 days.   Provider Signature  Regina Eck, PharmD, BCPS Clinical Pharmacist, [facility] [place]: [phone]

## 2019-10-10 ENCOUNTER — Other Ambulatory Visit: Payer: Self-pay

## 2019-10-10 ENCOUNTER — Ambulatory Visit (HOSPITAL_COMMUNITY)
Admission: RE | Admit: 2019-10-10 | Discharge: 2019-10-10 | Disposition: A | Discharge status: Home / Self Care | Payer: Medicare Other | Source: Ambulatory Visit | Attending: Nephrology | Admitting: Nephrology

## 2019-10-10 VITALS — BP 131/81 | HR 77 | Temp 97.2°F | Resp 20 | Ht 72.0 in | Wt 207.0 lb

## 2019-10-10 DIAGNOSIS — N289 Disorder of kidney and ureter, unspecified: Secondary | ICD-10-CM | POA: Diagnosis not present

## 2019-10-10 LAB — POCT HEMOGLOBIN-HEMACUE: Hemoglobin: 9.2 g/dL — ABNORMAL LOW (ref 13.0–17.0)

## 2019-10-10 MED ORDER — EPOETIN ALFA 40000 UNIT/ML IJ SOLN: 30000.0000 [IU] | INTRAMUSCULAR | Status: DC

## 2019-10-10 MED ORDER — EPOETIN ALFA 10000 UNIT/ML IJ SOLN
INTRAMUSCULAR | Status: AC
  Administered 2019-10-10: 10000 [IU]
  Filled 2019-10-10: qty 1

## 2019-10-10 MED ORDER — EPOETIN ALFA 20000 UNIT/ML IJ SOLN
INTRAMUSCULAR | Status: AC
  Administered 2019-10-10: 12:00:00 20000 [IU]
  Filled 2019-10-10: qty 1

## 2019-10-10 MED ORDER — SODIUM CHLORIDE 0.9 % IV SOLN
510.0000 mg | INTRAVENOUS | Status: DC
  Administered 2019-10-10: 12:00:00 510 mg via INTRAVENOUS
  Filled 2019-10-10: qty 17

## 2019-10-13 ENCOUNTER — Other Ambulatory Visit: Payer: Self-pay | Admitting: Podiatry

## 2019-10-14 ENCOUNTER — Other Ambulatory Visit: Payer: Self-pay | Admitting: Internal Medicine

## 2019-10-17 ENCOUNTER — Other Ambulatory Visit: Payer: Self-pay

## 2019-10-17 ENCOUNTER — Ambulatory Visit (HOSPITAL_COMMUNITY)
Admission: RE | Admit: 2019-10-17 | Discharge: 2019-10-17 | Disposition: A | Discharge status: Home / Self Care | Payer: Medicare Other | Source: Ambulatory Visit | Attending: Nephrology | Admitting: Nephrology

## 2019-10-17 DIAGNOSIS — D631 Anemia in chronic kidney disease: Secondary | ICD-10-CM | POA: Insufficient documentation

## 2019-10-17 DIAGNOSIS — N183 Chronic kidney disease, stage 3 unspecified: Secondary | ICD-10-CM | POA: Insufficient documentation

## 2019-10-17 MED ORDER — SODIUM CHLORIDE 0.9 % IV SOLN
510.0000 mg | INTRAVENOUS | Status: DC
  Administered 2019-10-17: 510 mg via INTRAVENOUS
  Filled 2019-10-17: qty 17

## 2019-10-23 ENCOUNTER — Ambulatory Visit: Payer: Medicare Other | Attending: Internal Medicine

## 2019-10-23 DIAGNOSIS — Z23 Encounter for immunization: Secondary | ICD-10-CM | POA: Insufficient documentation

## 2019-10-23 NOTE — Progress Notes (Signed)
   Covid-19 Vaccination Clinic  Name:  Alec Solis    MRN: [id number] DOB: 1933/03/30  10/23/2019  Alec Solis was observed post Covid-19 immunization for 15 minutes without incidence. He was provided with Vaccine Information Sheet and instruction to access the V-Safe system.   Alec Solis was instructed to call 911 with any severe reactions post vaccine: Marland Kitchen Difficulty breathing  . Swelling of your face and throat  . A fast heartbeat  . A bad rash all over your body  . Dizziness and weakness    Immunizations Administered    Name Date Dose VIS Date Route   Pfizer COVID-19 Vaccine 10/23/2019  9:04 AM 0.3 mL 08/04/2019 Intramuscular   Manufacturer: [place]   Lot: [id number]   [place]: [id number]

## 2019-10-24 ENCOUNTER — Encounter (HOSPITAL_COMMUNITY)
Admission: RE | Admit: 2019-10-24 | Discharge: 2019-10-24 | Disposition: A | Discharge status: Home / Self Care | Payer: Medicare Other | Source: Ambulatory Visit | Attending: Nephrology | Admitting: Nephrology

## 2019-10-24 ENCOUNTER — Other Ambulatory Visit: Payer: Self-pay

## 2019-10-24 VITALS — BP 148/92 | HR 80 | Temp 96.0°F | Resp 20

## 2019-10-24 DIAGNOSIS — N183 Chronic kidney disease, stage 3 unspecified: Secondary | ICD-10-CM | POA: Diagnosis not present

## 2019-10-24 DIAGNOSIS — N289 Disorder of kidney and ureter, unspecified: Secondary | ICD-10-CM

## 2019-10-24 DIAGNOSIS — D631 Anemia in chronic kidney disease: Secondary | ICD-10-CM | POA: Diagnosis not present

## 2019-10-24 LAB — POCT HEMOGLOBIN-HEMACUE: Hemoglobin: 9.8 g/dL — ABNORMAL LOW (ref 13.0–17.0)

## 2019-10-24 MED ORDER — EPOETIN ALFA 20000 UNIT/ML IJ SOLN
INTRAMUSCULAR | Status: AC
  Administered 2019-10-24: 20000 [IU] via SUBCUTANEOUS
  Filled 2019-10-24: qty 1

## 2019-10-24 MED ORDER — EPOETIN ALFA 10000 UNIT/ML IJ SOLN
INTRAMUSCULAR | Status: AC
  Administered 2019-10-24: 10000 [IU] via SUBCUTANEOUS
  Filled 2019-10-24: qty 1

## 2019-10-24 MED ORDER — EPOETIN ALFA 40000 UNIT/ML IJ SOLN: 30000.0000 [IU] | INTRAMUSCULAR | Status: DC

## 2019-11-01 ENCOUNTER — Other Ambulatory Visit: Payer: Self-pay | Admitting: Internal Medicine

## 2019-11-07 ENCOUNTER — Encounter (HOSPITAL_COMMUNITY)
Admission: RE | Admit: 2019-11-07 | Discharge: 2019-11-07 | Disposition: A | Discharge status: Home / Self Care | Payer: Medicare Other | Source: Ambulatory Visit | Attending: Nephrology | Admitting: Nephrology

## 2019-11-07 ENCOUNTER — Other Ambulatory Visit: Payer: Self-pay

## 2019-11-07 VITALS — BP 130/78 | HR 86 | Temp 98.3°F | Resp 18

## 2019-11-07 DIAGNOSIS — N289 Disorder of kidney and ureter, unspecified: Secondary | ICD-10-CM

## 2019-11-07 DIAGNOSIS — N183 Chronic kidney disease, stage 3 unspecified: Secondary | ICD-10-CM | POA: Diagnosis not present

## 2019-11-07 DIAGNOSIS — D631 Anemia in chronic kidney disease: Secondary | ICD-10-CM | POA: Diagnosis not present

## 2019-11-07 LAB — RENAL FUNCTION PANEL
Albumin: 4 g/dL (ref 3.5–5.0)
Anion gap: 10 (ref 5–15)
BUN: 24 mg/dL — ABNORMAL HIGH (ref 8–23)
CO2: 23 mmol/L (ref 22–32)
Calcium: 9.6 mg/dL (ref 8.9–10.3)
Chloride: 106 mmol/L (ref 98–111)
Creatinine, Ser: 1.78 mg/dL — ABNORMAL HIGH (ref 0.61–1.24)
GFR calc Af Amer: 40 mL/min — ABNORMAL LOW (ref >60)
GFR calc non Af Amer: 34 mL/min — ABNORMAL LOW (ref >60)
Glucose, Bld: 163 mg/dL — ABNORMAL HIGH (ref 70–99)
Phosphorus: 2.6 mg/dL (ref 2.5–4.6)
Potassium: 4.5 mmol/L (ref 3.5–5.1)
Sodium: 139 mmol/L (ref 135–145)

## 2019-11-07 LAB — IRON AND TIBC
Iron: 55 ug/dL (ref 45–182)
Saturation Ratios: 17 % — ABNORMAL LOW (ref 17.9–39.5)
TIBC: 323 ug/dL (ref 250–450)
UIBC: 268 ug/dL

## 2019-11-07 LAB — POCT HEMOGLOBIN-HEMACUE: Hemoglobin: 11.1 g/dL — ABNORMAL LOW (ref 13.0–17.0)

## 2019-11-07 LAB — FERRITIN: Ferritin: 1320 ng/mL — ABNORMAL HIGH (ref 24–336)

## 2019-11-07 MED ORDER — EPOETIN ALFA 10000 UNIT/ML IJ SOLN
INTRAMUSCULAR | Status: AC
  Administered 2019-11-07: 10000 [IU] via SUBCUTANEOUS
  Filled 2019-11-07: qty 1

## 2019-11-07 MED ORDER — EPOETIN ALFA 40000 UNIT/ML IJ SOLN: 30000.0000 [IU] | INTRAMUSCULAR | Status: DC

## 2019-11-07 MED ORDER — EPOETIN ALFA 20000 UNIT/ML IJ SOLN
INTRAMUSCULAR | Status: AC
  Administered 2019-11-07: 20000 [IU] via SUBCUTANEOUS
  Filled 2019-11-07: qty 1

## 2019-11-14 ENCOUNTER — Encounter: Payer: Self-pay | Admitting: Internal Medicine

## 2019-11-14 ENCOUNTER — Other Ambulatory Visit: Payer: Self-pay

## 2019-11-14 ENCOUNTER — Ambulatory Visit (INDEPENDENT_AMBULATORY_CARE_PROVIDER_SITE_OTHER): Payer: Medicare Other | Admitting: Internal Medicine

## 2019-11-14 VITALS — BP 130/74 | HR 95 | Temp 98.9°F | Ht 72.0 in | Wt 193.2 lb

## 2019-11-14 DIAGNOSIS — E1122 Type 2 diabetes mellitus with diabetic chronic kidney disease: Secondary | ICD-10-CM | POA: Diagnosis not present

## 2019-11-14 DIAGNOSIS — K219 Gastro-esophageal reflux disease without esophagitis: Secondary | ICD-10-CM | POA: Diagnosis not present

## 2019-11-14 DIAGNOSIS — N183 Chronic kidney disease, stage 3 unspecified: Secondary | ICD-10-CM

## 2019-11-14 DIAGNOSIS — I129 Hypertensive chronic kidney disease with stage 1 through stage 4 chronic kidney disease, or unspecified chronic kidney disease: Secondary | ICD-10-CM | POA: Diagnosis not present

## 2019-11-14 DIAGNOSIS — R112 Nausea with vomiting, unspecified: Secondary | ICD-10-CM | POA: Diagnosis not present

## 2019-11-14 MED ORDER — TRUE METRIX BLOOD GLUCOSE TEST VI STRP: ORAL_STRIP | 5 refills | Status: DC

## 2019-11-14 NOTE — Progress Notes (Signed)
This visit occurred during the SARS-CoV-2 public health emergency.  Safety protocols were in place, including screening questions prior to the visit, additional usage of staff PPE, and extensive cleaning of exam room while observing appropriate contact time as indicated for disinfecting solutions.  Subjective:     Patient ID: Alec Solis , male    DOB: 12-20-1932 , 86 y.o.   MRN: [id number]   Chief Complaint  Patient presents with  . Ozempic f/u    HPI  He presents today for DM and Ozepmic f/u. He reports not having any issues with Ozempic. He reports having improved blood sugars.   Diabetes He presents for his follow-up diabetic visit. He has type 2 diabetes mellitus. There are no hypoglycemic associated symptoms. Pertinent negatives for diabetes include no blurred vision and no chest pain. There are no hypoglycemic complications. Diabetic complications include nephropathy. Risk factors for coronary artery disease include diabetes mellitus, dyslipidemia, hypertension, male sex and sedentary lifestyle. He is following a generally healthy diet. He participates in exercise intermittently. His breakfast blood glucose is taken between 7-8 am. His breakfast blood glucose range is generally 110-130 mg/dl.  Hypertension This is a chronic problem. The current episode started more than 1 year ago. The problem has been gradually improving since onset. The problem is controlled. Pertinent negatives include no blurred vision, chest pain, palpitations or shortness of breath. Risk factors for coronary artery disease include diabetes mellitus, dyslipidemia, male gender and sedentary lifestyle. The current treatment provides moderate improvement.     Past Medical History:  Diagnosis Date  . Anemia   . Anxiety   . Arthritis   . Cataract   . Chronic kidney disease   . Depression   . Diabetes mellitus without complication ([place])    type II   . GERD (gastroesophageal reflux disease)   . Gout   . Heart  murmur   . Hypertension   . Myocardial infarction ([place])    minor Mi- years ago -   . Varicose veins of left lower extremity      Family History  Problem Relation Age of Onset  . Breast cancer Mother   . Stomach cancer Mother   . Diabetes Mellitus II Maternal Aunt      Current Outpatient Medications:  .  acetaminophen (ACETAMINOPHEN 8 HOUR) 650 MG CR tablet, Take 650 mg by mouth daily., Disp: , Rfl:  .  allopurinol (ZYLOPRIM) 100 MG tablet, TAKE 1 TABLET BY MOUTH TWICE DAILY, Disp: 180 tablet, Rfl: 1 .  amLODipine (NORVASC) 5 MG tablet, TAKE 1 TABLET BY MOUTH DAILY, Disp: 90 tablet, Rfl: 0 .  aspirin 81 MG tablet, Take 81 mg by mouth daily., Disp: , Rfl:  .  B-D ULTRAFINE III SHORT PEN 31G X 8 MM MISC, USE AS DIRECTED FOUR TIMES DAILY, Disp: 100 each, Rfl: 3 .  calcitRIOL (ROCALTROL) 0.25 MCG capsule, Take 0.25 mcg by mouth See admin instructions. Monday Thursday Sunday,  Pt takes every 3rd day, Disp: , Rfl:  .  carboxymethylcellulose (REFRESH PLUS) 0.5 % SOLN, 1 drop 3 (three) times daily as needed. As needed, Disp: , Rfl:  .  carvedilol (COREG) 12.5 MG tablet, Take 12.5 mg by mouth 2 (two) times daily with a meal., Disp: , Rfl:  .  ciclopirox (LOPROX) 0.77 % cream, APPLY TOPICALLY TO BOTH FEET AND BETWEEN TOES TWICE DAILY FOR 4 WEEKS, Disp: 30 g, Rfl: 1 .  clopidogrel (PLAVIX) 75 MG tablet, Take 1 tablet (75 mg total) by mouth daily.,  Disp: 90 tablet, Rfl: 2 .  ferrous sulfate (QC FERROUS SULFATE) 325 (65 FE) MG tablet, Take 325 mg by mouth daily with breakfast., Disp: , Rfl:  .  furosemide (LASIX) 40 MG tablet, Take 40 mg by mouth daily., Disp: , Rfl:  .  glucose blood (TRUE METRIX BLOOD GLUCOSE TEST) test strip, 1 each by Other route as needed for other. Use as directed to check blood sugars, Disp: 150 each, Rfl: 11 .  HYDROcodone-acetaminophen (NORCO) 5-325 MG tablet, Take 1 tablet by mouth every 6 (six) hours as needed for moderate pain., Disp: 12 tablet, Rfl: 0 .  Insulin Degludec  (TRESIBA FLEXTOUCH) 200 UNIT/ML SOPN, Inject 22 Units into the skin daily., Disp: 9 pen, Rfl: 0 .  Insulin Pen Needle 32G X 8 MM MISC, Use as directed, Disp: 100 each, Rfl: 2 .  Multiple Vitamins-Minerals (MULTIVITAMIN ADULT PO), Take by mouth daily., Disp: , Rfl:  .  Omega-3 Fatty Acids (FISH OIL OMEGA-3 PO), Take 750 mg by mouth daily., Disp: , Rfl:  .  omeprazole (PRILOSEC) 40 MG capsule, TAKE 1 CAPSULE BY MOUTH EVERY DAY BEFORE A MEAL, Disp: 90 capsule, Rfl: 1 .  rosuvastatin (CRESTOR) 20 MG tablet, TAKE 1 TABLET BY MOUTH MONDAY-FRIDAY, Disp: 75 tablet, Rfl: 1 .  saw palmetto 500 MG capsule, Take by mouth daily., Disp: , Rfl:  .  Semaglutide,0.25 or 0.5MG /DOS, (OZEMPIC, 0.25 OR 0.5 MG/DOSE,) 2 MG/1.5ML SOPN, Inject 0.5 mg into the skin once a week. 0.25 mg weekly, Disp: , Rfl:  .  sodium bicarbonate 650 MG tablet, Take 1,300 mg by mouth 2 (two) times daily., Disp: , Rfl:  .  TRUEPLUS LANCETS 30G MISC, USE AS DIRECTED TO CHECK BLOOD SUGAR THREE TIMES DAILY, Disp: 300 each, Rfl: 11 .  UNABLE TO FIND, USE UTD TO TEST BS TID, Disp: , Rfl:    No Known Allergies   Review of Systems  Constitutional: Negative.   Eyes: Negative for blurred vision.  Respiratory: Negative.  Negative for shortness of breath.   Cardiovascular: Negative.  Negative for chest pain and palpitations.  Gastrointestinal: Positive for nausea and vomiting.       He c/o intermittent nausea. States he feels sick on his stomach when awakening. He does not think it is due to Ozempic. He reports compliance with reflux med. Also with vomiting. Unable to determine what triggers his sx.  Neurological: Negative.   Psychiatric/Behavioral: Negative.      Today's Vitals   11/14/19 1054  BP: 130/74  Pulse: 95  Temp: 98.9 F (37.2 C)  TempSrc: Oral  Weight: 193 lb 3.2 oz (87.6 kg)  Height: 6' (1.829 m)  PainSc: 0-No pain   Body mass index is 26.2 kg/m.   Wt Readings from Last 3 Encounters:  11/14/19 193 lb 3.2 oz (87.6 kg)   10/17/19 195 lb 8 oz (88.7 kg)  10/10/19 207 lb (93.9 kg)     Objective:  Physical Exam Vitals and nursing note reviewed.  Constitutional:      Appearance: Normal appearance.  Cardiovascular:     Rate and Rhythm: Normal rate and regular rhythm.     Heart sounds: Normal heart sounds.  Pulmonary:     Effort: Pulmonary effort is normal.     Breath sounds: Normal breath sounds.  Abdominal:     Comments: Rounded, soft  Skin:    General: Skin is warm.  Neurological:     General: No focal deficit present.     Mental Status: He  is alert.  Psychiatric:        Mood and Affect: Mood normal.         Assessment And Plan:     1. Diabetes mellitus with stage 3 chronic kidney disease (HCC)  Chronic, he was congratulated on his improving blood sugars. I will check an a1c and adjust meds as needed. Pt advised that nausea can be caused by Ozempic. He does not wish to stop meds at this time.   - Hemoglobin A1c  2. Hypertensive nephropathy  Chronic, controlled. He will continue with current meds. He is encouraged to avoid adding salt to his foods.   3. Non-intractable vomiting with nausea, unspecified vomiting type  I think his sx are due to reflux; however, there is a possibility they are being exacerbated by the [place]. He is reminded to stop eating BEFORE getting full. He has a benign abdominal exam at this time.   4. Gastroesophageal reflux disease without esophagitis  He was advised to stop omeprazole. He was given samples of Dexilant to take once daily INSTEAD of omeprazole. He agrees to rto in six weeks for re-evaluation.    Maximino Greenland, MD    THE PATIENT IS ENCOURAGED TO PRACTICE SOCIAL DISTANCING DUE TO THE COVID-19 PANDEMIC.

## 2019-11-14 NOTE — Patient Instructions (Signed)

## 2019-11-15 ENCOUNTER — Ambulatory Visit: Payer: Medicare Other | Attending: Internal Medicine

## 2019-11-15 DIAGNOSIS — Z23 Encounter for immunization: Secondary | ICD-10-CM

## 2019-11-15 LAB — HEMOGLOBIN A1C
Est. average glucose Bld gHb Est-mCnc: 137 mg/dL
Hgb A1c MFr Bld: 6.4 % — ABNORMAL HIGH (ref 4.8–5.6)

## 2019-11-15 LAB — PTH, INTACT AND CALCIUM
Calcium, Total (PTH): 10.2 mg/dL (ref 8.6–10.2)
PTH: 43 pg/mL (ref 15–65)

## 2019-11-15 NOTE — Progress Notes (Signed)
   Covid-19 Vaccination Clinic  Name:  Alec Solis    MRN: [id number] DOB: 1932/08/27  11/15/2019  Alec Solis was observed post Covid-19 immunization for 15 minutes without incident. He was provided with Vaccine Information Sheet and instruction to access the V-Safe system.   Alec Solis was instructed to call 911 with any severe reactions post vaccine: Marland Kitchen Difficulty breathing  . Swelling of face and throat  . A fast heartbeat  . A bad rash all over body  . Dizziness and weakness   Immunizations Administered    Name Date Dose VIS Date Route   Pfizer COVID-19 Vaccine 11/15/2019  9:04 AM 0.3 mL 08/04/2019 Intramuscular   Manufacturer: [place]   Lot: [id number]   [place]: [id number]

## 2019-11-21 ENCOUNTER — Other Ambulatory Visit: Payer: Self-pay

## 2019-11-21 ENCOUNTER — Ambulatory Visit (HOSPITAL_COMMUNITY)
Admission: RE | Admit: 2019-11-21 | Discharge: 2019-11-21 | Disposition: A | Discharge status: Home / Self Care | Payer: Medicare Other | Source: Ambulatory Visit | Attending: Nephrology | Admitting: Nephrology

## 2019-11-21 VITALS — BP 130/88 | HR 83 | Temp 96.4°F | Resp 20

## 2019-11-21 DIAGNOSIS — N289 Disorder of kidney and ureter, unspecified: Secondary | ICD-10-CM

## 2019-11-21 DIAGNOSIS — N183 Chronic kidney disease, stage 3 unspecified: Secondary | ICD-10-CM | POA: Insufficient documentation

## 2019-11-21 DIAGNOSIS — D631 Anemia in chronic kidney disease: Secondary | ICD-10-CM | POA: Insufficient documentation

## 2019-11-21 LAB — POCT HEMOGLOBIN-HEMACUE: Hemoglobin: 10.8 g/dL — ABNORMAL LOW (ref 13.0–17.0)

## 2019-11-21 MED ORDER — EPOETIN ALFA 20000 UNIT/ML IJ SOLN
INTRAMUSCULAR | Status: AC
  Filled 2019-11-21: qty 1

## 2019-11-21 MED ORDER — EPOETIN ALFA 40000 UNIT/ML IJ SOLN
30000.0000 [IU] | INTRAMUSCULAR | Status: DC
  Administered 2019-11-21: 30000 [IU] via SUBCUTANEOUS

## 2019-11-21 MED ORDER — EPOETIN ALFA 10000 UNIT/ML IJ SOLN
INTRAMUSCULAR | Status: AC
  Filled 2019-11-21: qty 1

## 2019-11-22 MED FILL — Epoetin Alfa Inj 20000 Unit/ML: INTRAMUSCULAR | Qty: 1 | Status: AC

## 2019-11-22 MED FILL — Epoetin Alfa Inj 10000 Unit/ML: INTRAMUSCULAR | Qty: 1 | Status: AC

## 2019-12-03 ENCOUNTER — Other Ambulatory Visit: Payer: Self-pay | Admitting: Internal Medicine

## 2019-12-05 ENCOUNTER — Other Ambulatory Visit: Payer: Self-pay

## 2019-12-05 ENCOUNTER — Ambulatory Visit (HOSPITAL_COMMUNITY)
Admission: RE | Admit: 2019-12-05 | Discharge: 2019-12-05 | Disposition: A | Discharge status: Home / Self Care | Payer: Medicare Other | Source: Ambulatory Visit | Attending: Nephrology | Admitting: Nephrology

## 2019-12-05 VITALS — BP 117/82 | HR 62 | Temp 96.5°F | Resp 20

## 2019-12-05 DIAGNOSIS — D631 Anemia in chronic kidney disease: Secondary | ICD-10-CM | POA: Diagnosis not present

## 2019-12-05 DIAGNOSIS — N289 Disorder of kidney and ureter, unspecified: Secondary | ICD-10-CM

## 2019-12-05 DIAGNOSIS — N183 Chronic kidney disease, stage 3 unspecified: Secondary | ICD-10-CM | POA: Diagnosis not present

## 2019-12-05 LAB — POCT HEMOGLOBIN-HEMACUE: Hemoglobin: 11.9 g/dL — ABNORMAL LOW (ref 13.0–17.0)

## 2019-12-05 MED ORDER — EPOETIN ALFA 10000 UNIT/ML IJ SOLN
30000.0000 [IU] | INTRAMUSCULAR | Status: DC
  Administered 2019-12-05: 30000 [IU] via SUBCUTANEOUS

## 2019-12-05 MED ORDER — EPOETIN ALFA 10000 UNIT/ML IJ SOLN
INTRAMUSCULAR | Status: AC
  Filled 2019-12-05: qty 1

## 2019-12-05 MED ORDER — EPOETIN ALFA 20000 UNIT/ML IJ SOLN
INTRAMUSCULAR | Status: AC
  Filled 2019-12-05: qty 1

## 2019-12-06 MED FILL — Epoetin Alfa Inj 10000 Unit/ML: INTRAMUSCULAR | Qty: 1 | Status: AC

## 2019-12-06 MED FILL — Epoetin Alfa Inj 20000 Unit/ML: INTRAMUSCULAR | Qty: 1 | Status: AC

## 2019-12-07 ENCOUNTER — Other Ambulatory Visit: Payer: Self-pay

## 2019-12-07 ENCOUNTER — Encounter: Payer: Self-pay | Admitting: Internal Medicine

## 2019-12-07 ENCOUNTER — Ambulatory Visit (INDEPENDENT_AMBULATORY_CARE_PROVIDER_SITE_OTHER): Payer: Medicare Other | Admitting: Internal Medicine

## 2019-12-07 VITALS — BP 128/80 | HR 62 | Temp 98.3°F | Ht 72.0 in | Wt 190.8 lb

## 2019-12-07 DIAGNOSIS — R630 Anorexia: Secondary | ICD-10-CM | POA: Diagnosis not present

## 2019-12-07 DIAGNOSIS — E1122 Type 2 diabetes mellitus with diabetic chronic kidney disease: Secondary | ICD-10-CM | POA: Diagnosis not present

## 2019-12-07 DIAGNOSIS — N183 Chronic kidney disease, stage 3 unspecified: Secondary | ICD-10-CM | POA: Diagnosis not present

## 2019-12-07 DIAGNOSIS — R350 Frequency of micturition: Secondary | ICD-10-CM | POA: Diagnosis not present

## 2019-12-07 DIAGNOSIS — R112 Nausea with vomiting, unspecified: Secondary | ICD-10-CM

## 2019-12-07 LAB — POCT URINALYSIS DIPSTICK
Bilirubin, UA: NEGATIVE
Glucose, UA: NEGATIVE
Ketones, UA: NEGATIVE
Leukocytes, UA: NEGATIVE
Nitrite, UA: NEGATIVE
Protein, UA: POSITIVE — AB
Spec Grav, UA: 1.015 (ref 1.010–1.025)
Urobilinogen, UA: 0.2 E.U./dL
pH, UA: 6 (ref 5.0–8.0)

## 2019-12-07 MED ORDER — ONDANSETRON HCL 4 MG PO TABS: 4.0000 mg | ORAL_TABLET | Freq: Every day | ORAL | 1 refills | Status: DC | PRN

## 2019-12-07 NOTE — Patient Instructions (Addendum)
STOP OZEMPIC  TAKE DEXILANT TWICE DAILY FOR NEXT FIVE DAYS  STARTING NEXT Monday, YOU WILL RESUME ONCE DAILY DEXILANT  PLEASE CONTACT OFFICE IF YOUR SYMPTOMS PERSIST OR WORSEN    Nausea and Vomiting, Adult Nausea is feeling sick to your stomach or feeling that you are about to throw up (vomit). Vomiting is when food in your stomach is thrown up and out of the mouth. Throwing up can make you feel weak. It can also make you lose too much water in your body (get dehydrated). If you lose too much water in your body, you may:  Feel tired.  Feel thirsty.  Have a dry mouth.  Have cracked lips.  Go pee (urinate) less often. Older adults and people with other diseases or a weak body defense system (immune system) are at higher risk for losing too much water in the body. If you feel sick to your stomach and you throw up, it is important to follow instructions from your doctor about how to take care of yourself. Follow these instructions at home: Watch your symptoms for any changes. Tell your doctor about them. Follow these instructions to care for yourself at home. Eating and drinking      Take an ORS (oral rehydration solution). This is a drink that is sold at pharmacies and stores.  Drink clear fluids in small amounts as you are able, such as: ? Water. ? Ice chips. ? Fruit juice that has water added (diluted fruit juice). ? Low-calorie sports drinks.  Eat bland, easy-to-digest foods in small amounts as you are able, such as: ? Bananas. ? Applesauce. ? Rice. ? Low-fat (lean) meats. ? Toast. ? Crackers.  Avoid drinking fluids that have a lot of sugar or caffeine in them. This includes energy drinks, sports drinks, and soda.  Avoid alcohol.  Avoid spicy or fatty foods. General instructions  Take over-the-counter and prescription medicines only as told by your doctor.  Drink enough fluid to keep your pee (urine) pale yellow.  Wash your hands often with soap and water. If  you cannot use soap and water, use hand sanitizer.  Make sure that all people in your home wash their hands well and often.  Rest at home while you get better.  Watch your condition for any changes.  Take slow and deep breaths when you feel sick to your stomach.  Keep all follow-up visits as told by your doctor. This is important. Contact a doctor if:  Your symptoms get worse.  You have new symptoms.  You have a fever.  You cannot drink fluids without throwing up.  You feel sick to your stomach for more than 2 days.  You feel light-headed or dizzy.  You have a headache.  You have muscle cramps.  You have a rash.  You have pain while peeing. Get help right away if:  You have pain in your chest, neck, arm, or jaw.  You feel very weak or you pass out (faint).  You throw up again and again.  You have throw up that is bright red or looks like black coffee grounds.  You have bloody or black poop (stools) or poop that looks like tar.  You have a very bad headache, a stiff neck, or both.  You have very bad pain, cramping, or bloating in your belly (abdomen).  You have trouble breathing.  You are breathing very quickly.  Your heart is beating very quickly.  Your skin feels cold and clammy.  You feel  confused.  You have signs of losing too much water in your body, such as: ? Dark pee, very little pee, or no pee. ? Cracked lips. ? Dry mouth. ? Sunken eyes. ? Sleepiness. ? Weakness. These symptoms may be an emergency. Do not wait to see if the symptoms will go away. Get medical help right away. Call your local emergency services (911 in the [place] Do not drive yourself to the hospital. Summary  Nausea is feeling sick to your stomach or feeling that you are about to throw up (vomit). Vomiting is when food in your stomach is thrown up and out of the mouth.  Follow instructions from your doctor about eating and drinking to keep from losing too much water in your  body.  Take over-the-counter and prescription medicines only as told by your doctor.  Contact your doctor if your symptoms get worse or you have new symptoms.  Keep all follow-up visits as told by your doctor. This is important. This information is not intended to replace advice given to you by your health care provider. Make sure you discuss any questions you have with your health care provider. Document Revised: 12/02/2018 Document Reviewed: 01/18/2018 Elsevier Patient Education  [employer].

## 2019-12-07 NOTE — Progress Notes (Signed)
This visit occurred during the SARS-CoV-2 public health emergency.  Safety protocols were in place, including screening questions prior to the visit, additional usage of staff PPE, and extensive cleaning of exam room while observing appropriate contact time as indicated for disinfecting solutions.  Subjective:     Patient ID: Alec Solis , male    DOB: 09-28-1932 , 86 y.o.   MRN: [id number]   Chief Complaint  Patient presents with  . Emesis  . Diabetes    HPI  He is here today for further evaluation of nausea and vomiting accompanied by decreased appetite. He denies ill contacts. No one is sick in his household.  He reports his sx started about a week ago. He denies fever, but has chills. He also reports urinary frequency. He is not sure what is causing his sx. He denies ill contacts.  He has noticed that he has less appetite as well. Reports he usually vomits yellowish substance after taking Dexilant in am. He reports having daily bowel movement. He has also noticed increased belching and gassiness.   He has been taking [place] and Ozempic for his diabetes. He took a dose of Ozempic this morning.   Emesis  This is a recurrent problem. The current episode started in the past 7 days. The problem occurs less than 2 times per day. The emesis has an appearance of bile. There has been no fever. Associated symptoms include chills. Pertinent negatives include no chest pain. He has tried nothing for the symptoms.  Diabetes He has type 2 diabetes mellitus. There are no hypoglycemic associated symptoms. Pertinent negatives for diabetes include no blurred vision and no chest pain. There are no hypoglycemic complications. Diabetic complications include nephropathy.     Past Medical History:  Diagnosis Date  . Anemia   . Anxiety   . Arthritis   . Cataract   . Chronic kidney disease   . Depression   . Diabetes mellitus without complication ([place])    type II   . GERD (gastroesophageal reflux  disease)   . Gout   . Heart murmur   . Hypertension   . Myocardial infarction ([place])    minor Mi- years ago -   . Varicose veins of left lower extremity      Family History  Problem Relation Age of Onset  . Breast cancer Mother   . Stomach cancer Mother   . Diabetes Mellitus II Maternal Aunt      Current Outpatient Medications:  .  acetaminophen (ACETAMINOPHEN 8 HOUR) 650 MG CR tablet, Take 650 mg by mouth daily., Disp: , Rfl:  .  allopurinol (ZYLOPRIM) 100 MG tablet, TAKE 1 TABLET BY MOUTH TWICE DAILY, Disp: 180 tablet, Rfl: 1 .  amLODipine (NORVASC) 5 MG tablet, TAKE 1 TABLET BY MOUTH DAILY, Disp: 90 tablet, Rfl: 0 .  aspirin 81 MG tablet, Take 81 mg by mouth daily., Disp: , Rfl:  .  B-D ULTRAFINE III SHORT PEN 31G X 8 MM MISC, USE AS DIRECTED FOUR TIMES DAILY, Disp: 100 each, Rfl: 3 .  calcitRIOL (ROCALTROL) 0.25 MCG capsule, Take 0.25 mcg by mouth See admin instructions. Monday Thursday Sunday,  Pt takes every 3rd day, Disp: , Rfl:  .  carboxymethylcellulose (REFRESH PLUS) 0.5 % SOLN, 1 drop 3 (three) times daily as needed. As needed, Disp: , Rfl:  .  carvedilol (COREG) 12.5 MG tablet, Take 12.5 mg by mouth 2 (two) times daily with a meal., Disp: , Rfl:  .  ciclopirox (LOPROX) 0.77 %  cream, APPLY TOPICALLY TO BOTH FEET AND BETWEEN TOES TWICE DAILY FOR 4 WEEKS, Disp: 30 g, Rfl: 1 .  clopidogrel (PLAVIX) 75 MG tablet, Take 1 tablet (75 mg total) by mouth daily., Disp: 90 tablet, Rfl: 2 .  ferrous sulfate (QC FERROUS SULFATE) 325 (65 FE) MG tablet, Take 325 mg by mouth daily with breakfast., Disp: , Rfl:  .  furosemide (LASIX) 40 MG tablet, Take 40 mg by mouth daily., Disp: , Rfl:  .  glucose blood (TRUE METRIX BLOOD GLUCOSE TEST) test strip, Use as directed to check blood sugars 3 times per day dx: e11.65, Disp: 300 each, Rfl: 5 .  HYDROcodone-acetaminophen (NORCO) 5-325 MG tablet, Take 1 tablet by mouth every 6 (six) hours as needed for moderate pain., Disp: 12 tablet, Rfl: 0 .   Insulin Degludec (TRESIBA FLEXTOUCH) 200 UNIT/ML SOPN, Inject 22 Units into the skin daily., Disp: 9 pen, Rfl: 0 .  Insulin Pen Needle 32G X 8 MM MISC, Use as directed, Disp: 100 each, Rfl: 2 .  Multiple Vitamins-Minerals (MULTIVITAMIN ADULT PO), Take by mouth daily., Disp: , Rfl:  .  Omega-3 Fatty Acids (FISH OIL OMEGA-3 PO), Take 750 mg by mouth daily., Disp: , Rfl:  .  omeprazole (PRILOSEC) 40 MG capsule, TAKE 1 CAPSULE BY MOUTH EVERY DAY BEFORE A MEAL, Disp: 90 capsule, Rfl: 1 .  rosuvastatin (CRESTOR) 20 MG tablet, TAKE 1 TABLET BY MOUTH MONDAY-FRIDAY, Disp: 75 tablet, Rfl: 1 .  saw palmetto 500 MG capsule, Take by mouth daily., Disp: , Rfl:  .  Semaglutide,0.25 or 0.5MG /DOS, (OZEMPIC, 0.25 OR 0.5 MG/DOSE,) 2 MG/1.5ML SOPN, Inject 0.5 mg into the skin once a week. 0.25 mg weekly, Disp: , Rfl:  .  sodium bicarbonate 650 MG tablet, Take 1,300 mg by mouth 2 (two) times daily., Disp: , Rfl:  .  TRUEPLUS LANCETS 30G MISC, USE AS DIRECTED TO CHECK BLOOD SUGAR THREE TIMES DAILY, Disp: 300 each, Rfl: 11 .  UNABLE TO FIND, USE UTD TO TEST BS TID, Disp: , Rfl:    No Known Allergies   Review of Systems  Constitutional: Positive for chills.  Eyes: Negative for blurred vision.  Respiratory: Negative.   Cardiovascular: Negative.  Negative for chest pain.  Gastrointestinal: Positive for vomiting.  Genitourinary: Positive for frequency.  Neurological: Negative.   Psychiatric/Behavioral: Negative.      Today's Vitals   12/07/19 1049  BP: 128/80  Pulse: 62  Temp: 98.3 F (36.8 C)  TempSrc: Oral  Weight: 190 lb 12.8 oz (86.5 kg)  Height: 6' (1.829 m)   Body mass index is 25.88 kg/m.   Wt Readings from Last 3 Encounters:  12/07/19 190 lb 12.8 oz (86.5 kg)  11/14/19 193 lb 3.2 oz (87.6 kg)  10/17/19 195 lb 8 oz (88.7 kg)     Objective:  Physical Exam Vitals and nursing note reviewed.  Constitutional:      Appearance: Normal appearance.  Cardiovascular:     Rate and Rhythm: Normal  rate and regular rhythm.     Heart sounds: Normal heart sounds.  Pulmonary:     Effort: Pulmonary effort is normal.     Breath sounds: Normal breath sounds.  Abdominal:     General: Bowel sounds are normal. There is no distension.     Palpations: Abdomen is soft.     Tenderness: There is no abdominal tenderness.  Skin:    General: Skin is warm.  Neurological:     General: No focal deficit present.  Mental Status: He is alert.  Psychiatric:        Mood and Affect: Mood normal.         Assessment And Plan:     1. Non-intractable vomiting with nausea, unspecified vomiting type  Pt advised that his sx are possibly due to the Ozempic. He was advised to stop the medication. I will also check amylase/lipase and renal/liver function today. He was also advised to take Dexilant twice daily for the next five days or so. He is encouraged to call office tomorrow to let me know how he is doing. If sx are persistent, I will consider radiographic studies.   2. Urinary frequency  I will check urinalysis.   - POCT Urinalysis Dipstick (81002)  3. Decreased appetite  Patient reminded that this is a side effect of Ozempic as well.   4. Diabetes mellitus with stage 3 chronic kidney disease ([place])  He was advised that his sugars will likely go up when the Ozempic is out of his system. Pt advised we will likely need to add sliding scale meal time insulin. He is encouraged to cut back on his intake of sugary beverages.    Maximino Greenland, MD    THE PATIENT IS ENCOURAGED TO PRACTICE SOCIAL DISTANCING DUE TO THE COVID-19 PANDEMIC.

## 2019-12-08 LAB — CMP14+EGFR
ALT: 18 IU/L (ref 0–44)
AST: 17 IU/L (ref 0–40)
Albumin/Globulin Ratio: 1.4 (ref 1.2–2.2)
Albumin: 4.6 g/dL (ref 3.6–4.6)
Alkaline Phosphatase: 137 IU/L — ABNORMAL HIGH (ref 39–117)
BUN/Creatinine Ratio: 12 (ref 10–24)
BUN: 25 mg/dL (ref 8–27)
Bilirubin Total: 0.3 mg/dL (ref 0.0–1.2)
CO2: 21 mmol/L (ref 20–29)
Calcium: 10.2 mg/dL (ref 8.6–10.2)
Chloride: 102 mmol/L (ref 96–106)
Creatinine, Ser: 2.14 mg/dL — ABNORMAL HIGH (ref 0.76–1.27)
GFR calc Af Amer: 32 mL/min/{1.73_m2} — ABNORMAL LOW (ref >59)
GFR calc non Af Amer: 27 mL/min/{1.73_m2} — ABNORMAL LOW (ref >59)
Globulin, Total: 3.2 g/dL (ref 1.5–4.5)
Glucose: 197 mg/dL — ABNORMAL HIGH (ref 65–99)
Potassium: 4.4 mmol/L (ref 3.5–5.2)
Sodium: 141 mmol/L (ref 134–144)
Total Protein: 7.8 g/dL (ref 6.0–8.5)

## 2019-12-08 LAB — LIPASE: Lipase: 47 U/L (ref 13–78)

## 2019-12-08 LAB — AMYLASE: Amylase: 174 U/L — ABNORMAL HIGH (ref 31–110)

## 2019-12-11 ENCOUNTER — Other Ambulatory Visit: Payer: Self-pay

## 2019-12-11 MED ORDER — TRESIBA FLEXTOUCH 200 UNIT/ML ~~LOC~~ SOPN: 22.0000 [IU] | PEN_INJECTOR | Freq: Every day | SUBCUTANEOUS | 0 refills | Status: DC

## 2019-12-12 ENCOUNTER — Encounter: Payer: Self-pay | Admitting: Podiatry

## 2019-12-12 ENCOUNTER — Other Ambulatory Visit: Payer: Self-pay

## 2019-12-12 ENCOUNTER — Ambulatory Visit (INDEPENDENT_AMBULATORY_CARE_PROVIDER_SITE_OTHER): Payer: Medicare Other | Admitting: Podiatry

## 2019-12-12 VITALS — Temp 98.0°F

## 2019-12-12 DIAGNOSIS — L84 Corns and callosities: Secondary | ICD-10-CM

## 2019-12-12 DIAGNOSIS — M2011 Hallux valgus (acquired), right foot: Secondary | ICD-10-CM

## 2019-12-12 DIAGNOSIS — N183 Chronic kidney disease, stage 3 unspecified: Secondary | ICD-10-CM

## 2019-12-12 DIAGNOSIS — Z794 Long term (current) use of insulin: Secondary | ICD-10-CM | POA: Diagnosis not present

## 2019-12-12 DIAGNOSIS — M79674 Pain in right toe(s): Secondary | ICD-10-CM | POA: Diagnosis not present

## 2019-12-12 DIAGNOSIS — E0822 Diabetes mellitus due to underlying condition with diabetic chronic kidney disease: Secondary | ICD-10-CM

## 2019-12-12 DIAGNOSIS — B351 Tinea unguium: Secondary | ICD-10-CM

## 2019-12-12 DIAGNOSIS — M79675 Pain in left toe(s): Secondary | ICD-10-CM

## 2019-12-12 DIAGNOSIS — M2012 Hallux valgus (acquired), left foot: Secondary | ICD-10-CM

## 2019-12-12 DIAGNOSIS — E119 Type 2 diabetes mellitus without complications: Secondary | ICD-10-CM

## 2019-12-12 NOTE — Patient Instructions (Addendum)
Bunion  A bunion is a bump on the base of the big toe that forms when the bones of the big toe joint move out of position. Bunions may be small at first, but they often get larger over time. They can make walking painful. What are the causes? A bunion may be caused by:  Wearing narrow or pointed shoes that force the big toe to press against the other toes.  Abnormal foot development that causes the foot to roll inward (pronate).  Changes in the foot that are caused by certain diseases, such as rheumatoid arthritis or polio.  A foot injury. What increases the risk? The following factors may make you more likely to develop this condition:  Wearing shoes that squeeze the toes together.  Having certain diseases, such as: ? Rheumatoid arthritis. ? Polio. ? Cerebral palsy.  Having family members who have bunions.  Being born with a foot deformity, such as flat feet or low arches.  Doing activities that put a lot of pressure on the feet, such as ballet dancing. What are the signs or symptoms? The main symptom of a bunion is a noticeable bump on the big toe. Other symptoms may include:  Pain.  Swelling around the big toe.  Redness and inflammation.  Thick or hardened skin on the big toe or between the toes.  Stiffness or loss of motion in the big toe.  Trouble with walking. How is this diagnosed? A bunion may be diagnosed based on your symptoms, medical history, and activities. You may have tests, such as:  X-rays. These allow your health care provider to check the position of the bones in your foot and look for damage to your joint. They also help your health care provider determine the severity of your bunion and the best way to treat it.  Joint aspiration. In this test, a sample of fluid is removed from the toe joint. This test may be done if you are in a lot of pain. It helps rule out diseases that cause painful swelling of the joints, such as arthritis. How is this  treated? Treatment depends on the severity of your symptoms. The goal of treatment is to relieve symptoms and prevent the bunion from getting worse. Your health care provider may recommend:  Wearing shoes that have a wide toe box.  Using bunion pads to cushion the affected area.  Taping your toes together to keep them in a normal position.  Placing a device inside your shoe (orthotics) to help reduce pressure on your toe joint.  Taking medicine to ease pain, inflammation, and swelling.  Applying heat or ice to the affected area.  Doing stretching exercises.  Surgery to remove scar tissue and move the toes back into their normal position. This treatment is rare. Follow these instructions at home: Managing pain, stiffness, and swelling   If directed, put ice on the painful area: ? Put ice in a plastic bag. ? Place a towel between your skin and the bag. ? Leave the ice on for 20 minutes, 2-3 times a day. Activity   If directed, apply heat to the affected area before you exercise. Use the heat source that your health care provider recommends, such as a moist heat pack or a heating pad. ? Place a towel between your skin and the heat source. ? Leave the heat on for 20-30 minutes. ? Remove the heat if your skin turns bright red. This is especially important if you are unable to feel pain,   heat, or cold. You may have a greater risk of getting burned.  Do exercises as told by your health care provider. General instructions  Support your toe joint with proper footwear, shoe padding, or taping as told by your health care provider.  Take over-the-counter and prescription medicines only as told by your health care provider.  Keep all follow-up visits as told by your health care provider. This is important. Contact a health care provider if your symptoms:  Get worse.  Do not improve in 2 weeks. Get help right away if you have:  Severe pain and trouble with walking. Summary  A  bunion is a bump on the base of the big toe that forms when the bones of the big toe joint move out of position.  Bunions can make walking painful.  Treatment depends on the severity of your symptoms.  Support your toe joint with proper footwear, shoe padding, or taping as told by your health care provider. This information is not intended to replace advice given to you by your health care provider. Make sure you discuss any questions you have with your health care provider. Document Revised: 02/14/2018 Document Reviewed: 12/21/2017 Elsevier Patient Education  [place] Toe  Hammer toe is a change in the shape (a deformity) of your toe. The deformity causes the middle joint of your toe to stay bent. This causes pain, especially when you are wearing shoes. Hammer toe starts gradually. At first, the toe can be straightened. Gradually over time, the deformity becomes stiff and permanent. Early treatments to keep the toe straight may relieve pain. As the deformity becomes stiff and permanent, surgery may be needed to straighten the toe. What are the causes? Hammer toe is caused by abnormal bending of the toe joint that is closest to your foot. It happens gradually over time. This pulls on the muscles and connections (tendons) of the toe joint, making them weak and stiff. It is often related to wearing shoes that are too short or narrow and do not let your toes straighten. What increases the risk? You may be at greater risk for hammer toe if you:  Are male.  Are older.  Wear shoes that are too small.  Wear high-heeled shoes that pinch your toes.  Are a Engineer, mining.  Have a second toe that is longer than your big toe (first toe).  Injure your foot or toe.  Have arthritis.  Have a family history of hammer toe.  Have a nerve or muscle disorder. What are the signs or symptoms? The main symptoms of this condition are pain and deformity of the toe. The pain is worse  when wearing shoes, walking, or running. Other symptoms may include:  Corns or calluses over the bent part of the toe or between the toes.  Redness and a burning feeling on the toe.  An open sore that forms on the top of the toe.  Not being able to straighten the toe. How is this diagnosed? This condition is diagnosed based on your symptoms and a physical exam. During the exam, your health care provider will try to straighten your toe to see how stiff the deformity is. You may also have tests, such as:  A blood test to check for rheumatoid arthritis.  An X-ray to show how severe the deformity is. How is this treated? Treatment for this condition will depend on how stiff the deformity is. Surgery is often needed. However, sometimes a hammer toe can  be straightened without surgery. Treatments that do not involve surgery include:  Taping the toe into a straightened position.  Using pads and cushions to protect the toe (orthotics).  Wearing shoes that provide enough room for the toes.  Doing toe-stretching exercises at home.  Taking an NSAID to reduce pain and swelling. If these treatments do not help or the toe cannot be straightened, surgery is the next option. The most common surgeries used to straighten a hammer toe include:  Arthroplasty. In this procedure, part of the joint is removed, and that allows the toe to straighten.  Fusion. In this procedure, cartilage between the two bones of the joint is taken out and the bones are fused together into one longer bone.  Implantation. In this procedure, part of the bone is removed and replaced with an implant to let the toe move again.  Flexor tendon transfer. In this procedure, the tendons that curl the toes down (flexor tendons) are repositioned. Follow these instructions at home:  Take over-the-counter and prescription medicines only as told by your health care provider.  Do toe straightening and stretching exercises as told by  your health care provider.  Keep all follow-up visits as told by your health care provider. This is important. How is this prevented?  Wear shoes that give your toes enough room and do not cause pain.  Do not wear high-heeled shoes. Contact a health care provider if:  Your pain gets worse.  Your toe becomes red or swollen.  You develop an open sore on your toe. This information is not intended to replace advice given to you by your health care provider. Make sure you discuss any questions you have with your health care provider. Document Revised: 07/23/2017 Document Reviewed: 12/04/2015 Elsevier Patient Education  [place].  Diabetes Mellitus and [place] care is an important part of your health, especially when you have diabetes. Diabetes may cause you to have problems because of poor blood flow (circulation) to your feet and legs, which can cause your skin to:  Become thinner and drier.  Break more easily.  Heal more slowly.  Peel and crack. You may also have nerve damage (neuropathy) in your legs and feet, causing decreased feeling in them. This means that you may not notice minor injuries to your feet that could lead to more serious problems. Noticing and addressing any potential problems early is the best way to prevent future foot problems. How to care for your feet Foot hygiene  Wash your feet daily with warm water and mild soap. Do not use hot water. Then, pat your feet and the areas between your toes until they are completely dry. Do not soak your feet as this can dry your skin.  Trim your toenails straight across. Do not dig under them or around the cuticle. File the edges of your nails with an emery board or nail file.  Apply a moisturizing lotion or petroleum jelly to the skin on your feet and to dry, brittle toenails. Use lotion that does not contain alcohol and is unscented. Do not apply lotion between your toes. Shoes and socks  Wear clean socks  or stockings every day. Make sure they are not too tight. Do not wear knee-high stockings since they may decrease blood flow to your legs.  Wear shoes that fit properly and have enough cushioning. Always look in your shoes before you put them on to be sure there are no objects inside.  To break in  new shoes, wear them for just a few hours a day. This prevents injuries on your feet. Wounds, scrapes, corns, and calluses  Check your feet daily for blisters, cuts, bruises, sores, and redness. If you cannot see the bottom of your feet, use a mirror or ask someone for help.  Do not cut corns or calluses or try to remove them with medicine.  If you find a minor scrape, cut, or break in the skin on your feet, keep it and the skin around it clean and dry. You may clean these areas with mild soap and water. Do not clean the area with peroxide, alcohol, or iodine.  If you have a wound, scrape, corn, or callus on your foot, look at it several times a day to make sure it is healing and not infected. Check for: ? Redness, swelling, or pain. ? Fluid or blood. ? Warmth. ? Pus or a bad smell. General instructions  Do not cross your legs. This may decrease blood flow to your feet.  Do not use heating pads or hot water bottles on your feet. They may burn your skin. If you have lost feeling in your feet or legs, you may not know this is happening until it is too late.  Protect your feet from hot and cold by wearing shoes, such as at the beach or on hot pavement.  Schedule a complete foot exam at least once a year (annually) or more often if you have foot problems. If you have foot problems, report any cuts, sores, or bruises to your health care provider immediately. Contact a health care provider if:  You have a medical condition that increases your risk of infection and you have any cuts, sores, or bruises on your feet.  You have an injury that is not healing.  You have redness on your legs or  feet.  You feel burning or tingling in your legs or feet.  You have pain or cramps in your legs and feet.  Your legs or feet are numb.  Your feet always feel cold.  You have pain around a toenail. Get help right away if:  You have a wound, scrape, corn, or callus on your foot and: ? You have pain, swelling, or redness that gets worse. ? You have fluid or blood coming from the wound, scrape, corn, or callus. ? Your wound, scrape, corn, or callus feels warm to the touch. ? You have pus or a bad smell coming from the wound, scrape, corn, or callus. ? You have a fever. ? You have a red line going up your leg. Summary  Check your feet every day for cuts, sores, red spots, swelling, and blisters.  Moisturize feet and legs daily.  Wear shoes that fit properly and have enough cushioning.  If you have foot problems, report any cuts, sores, or bruises to your health care provider immediately.  Schedule a complete foot exam at least once a year (annually) or more often if you have foot problems. This information is not intended to replace advice given to you by your health care provider. Make sure you discuss any questions you have with your health care provider. Document Revised: 05/03/2019 Document Reviewed: 09/11/2016 Elsevier Patient Education  [place].  Peripheral Neuropathy Peripheral neuropathy is a type of nerve damage. It affects nerves that carry signals between the spinal cord and the arms, legs, and the rest of the body (peripheral nerves). It does not affect nerves in the spinal cord  or brain. In peripheral neuropathy, one nerve or a group of nerves may be damaged. Peripheral neuropathy is a broad category that includes many specific nerve disorders, like diabetic neuropathy, hereditary neuropathy, and carpal tunnel syndrome. What are the causes? This condition may be caused by:  Diabetes. This is the most common cause of peripheral neuropathy.  Nerve  injury.  Pressure or stress on a nerve that lasts a long time.  Lack (deficiency) of B vitamins. This can result from alcoholism, poor diet, or a restricted diet.  Infections.  Autoimmune diseases, such as rheumatoid arthritis and systemic lupus erythematosus.  Nerve diseases that are passed from parent to child (inherited).  Some medicines, such as cancer medicines (chemotherapy).  Poisonous (toxic) substances, such as lead and mercury.  Too little blood flowing to the legs.  Kidney disease.  Thyroid disease. In some cases, the cause of this condition is not known. What are the signs or symptoms? Symptoms of this condition depend on which of your nerves is damaged. Common symptoms include:  Loss of feeling (numbness) in the feet, hands, or both.  Tingling in the feet, hands, or both.  Burning pain.  Very sensitive skin.  Weakness.  Not being able to move a part of the body (paralysis).  Muscle twitching.  Clumsiness or poor coordination.  Loss of balance.  Not being able to control your bladder.  Feeling dizzy.  Sexual problems. How is this diagnosed? Diagnosing and finding the cause of peripheral neuropathy can be difficult. Your health care provider will take your medical history and do a physical exam. A neurological exam will also be done. This involves checking things that are affected by your brain, spinal cord, and nerves (nervous system). For example, your health care provider will check your reflexes, how you move, and what you can feel. You may have other tests, such as:  Blood tests.  Electromyogram (EMG) and nerve conduction tests. These tests check nerve function and how well the nerves are controlling the muscles.  Imaging tests, such as CT scans or MRI to rule out other causes of your symptoms.  Removing a small piece of nerve to be examined in a lab (nerve biopsy). This is rare.  Removing and examining a small amount of the fluid that  surrounds the brain and spinal cord (lumbar puncture). This is rare. How is this treated? Treatment for this condition may involve:  Treating the underlying cause of the neuropathy, such as diabetes, kidney disease, or vitamin deficiencies.  Stopping medicines that can cause neuropathy, such as chemotherapy.  Medicine to relieve pain. Medicines may include: ? Prescription or over-the-counter pain medicine. ? Antiseizure medicine. ? Antidepressants. ? Pain-relieving patches that are applied to painful areas of skin.  Surgery to relieve pressure on a nerve or to destroy a nerve that is causing pain.  Physical therapy to help improve movement and balance.  Devices to help you move around (assistive devices). Follow these instructions at home: Medicines  Take over-the-counter and prescription medicines only as told by your health care provider. Do not take any other medicines without first asking your health care provider.  Do not drive or use heavy machinery while taking prescription pain medicine. Lifestyle   Do not use any products that contain nicotine or tobacco, such as cigarettes and e-cigarettes. Smoking keeps blood from reaching damaged nerves. If you need help quitting, ask your health care provider.  Avoid or limit alcohol. Too much alcohol can cause a vitamin B deficiency, and vitamin  B is needed for healthy nerves.  Eat a healthy diet. This includes: ? Eating foods that are high in fiber, such as fresh fruits and vegetables, whole grains, and beans. ? Limiting foods that are high in fat and processed sugars, such as fried or sweet foods. General instructions   If you have diabetes, work closely with your health care provider to keep your blood sugar under control.  If you have numbness in your feet: ? Check every day for signs of injury or infection. Watch for redness, warmth, and swelling. ? Wear padded socks and comfortable shoes. These help protect your  feet.  Develop a good support system. Living with peripheral neuropathy can be stressful. Consider talking with a mental health specialist or joining a support group.  Use assistive devices and attend physical therapy as told by your health care provider. This may include using a walker or a cane.  Keep all follow-up visits as told by your health care provider. This is important. Contact a health care provider if:  You have new signs or symptoms of peripheral neuropathy.  You are struggling emotionally from dealing with peripheral neuropathy.  Your pain is not well-controlled. Get help right away if:  You have an injury or infection that is not healing normally.  You develop new weakness in an arm or leg.  You fall frequently. Summary  Peripheral neuropathy is when the nerves in the arms, or legs are damaged, resulting in numbness, weakness, or pain.  There are many causes of peripheral neuropathy, including diabetes, pinched nerves, vitamin deficiencies, autoimmune disease, and hereditary conditions.  Diagnosing and finding the cause of peripheral neuropathy can be difficult. Your health care provider will take your medical history, do a physical exam, and do tests, including blood tests and nerve function tests.  Treatment involves treating the underlying cause of the neuropathy and taking medicines to help control pain. Physical therapy and assistive devices may also help. This information is not intended to replace advice given to you by your health care provider. Make sure you discuss any questions you have with your health care provider. Document Revised: 07/23/2017 Document Reviewed: 10/19/2016 Elsevier Patient Education  [place] are small areas of thickened skin that occur on the top, sides, or tip of a toe. They contain a cone-shaped core with a point that can press on a nerve below. This causes pain.  Calluses are areas of thickened  skin that can occur anywhere on the body, including the hands, fingers, palms, soles of the feet, and heels. Calluses are usually larger than corns. What are the causes? Corns and calluses are caused by rubbing (friction) or pressure, such as from shoes that are too tight or do not fit properly. What increases the risk? Corns are more likely to develop in people who have misshapen toes (toe deformities), such as hammer toes. Calluses can occur with friction to any area of the skin. They are more likely to develop in people who:  Work with their hands.  Wear shoes that fit poorly, are too tight, or are high-heeled.  Have toe deformities. What are the signs or symptoms? Symptoms of a corn or callus include:  A hard growth on the skin.  Pain or tenderness under the skin.  Redness and swelling.  Increased discomfort while wearing tight-fitting shoes, if your feet are affected. If a corn or callus becomes infected, symptoms may include:  Redness and swelling that gets worse.  Pain.  Fluid, blood, or pus draining from the corn or callus. How is this diagnosed? Corns and calluses may be diagnosed based on your symptoms, your medical history, and a physical exam. How is this treated? Treatment for corns and calluses may include:  Removing the cause of the friction or pressure. This may involve: ? Changing your shoes. ? Wearing shoe inserts (orthotics) or other protective layers in your shoes, such as a corn pad. ? Wearing gloves.  Applying medicine to the skin (topical medicine) to help soften skin in the hardened, thickened areas.  Removing layers of dead skin with a file to reduce the size of the corn or callus.  Removing the corn or callus with a scalpel or laser.  Taking antibiotic medicines, if your corn or callus is infected.  Having surgery, if a toe deformity is the cause. Follow these instructions at home:   Take over-the-counter and prescription medicines only as  told by your health care provider.  If you were prescribed an antibiotic, take it as told by your health care provider. Do not stop taking it even if your condition starts to improve.  Wear shoes that fit well. Avoid wearing high-heeled shoes and shoes that are too tight or too loose.  Wear any padding, protective layers, gloves, or orthotics as told by your health care provider.  Soak your hands or feet and then use a file or pumice stone to soften your corn or callus. Do this as told by your health care provider.  Check your corn or callus every day for symptoms of infection. Contact a health care provider if you:  Notice that your symptoms do not improve with treatment.  Have redness or swelling that gets worse.  Notice that your corn or callus becomes painful.  Have fluid, blood, or pus coming from your corn or callus.  Have new symptoms. Summary  Corns are small areas of thickened skin that occur on the top, sides, or tip of a toe.  Calluses are areas of thickened skin that can occur anywhere on the body, including the hands, fingers, palms, and soles of the feet. Calluses are usually larger than corns.  Corns and calluses are caused by rubbing (friction) or pressure, such as from shoes that are too tight or do not fit properly.  Treatment may include wearing any padding, protective layers, gloves, or orthotics as told by your health care provider. This information is not intended to replace advice given to you by your health care provider. Make sure you discuss any questions you have with your health care provider. Document Revised: 11/30/2018 Document Reviewed: 06/23/2017 Elsevier Patient Education  2020 [employer].

## 2019-12-14 NOTE — Progress Notes (Addendum)
Subjective: Alec Solis presents today for for annual diabetic foot examination, at risk foot care. Pt has h/o NIDDM with chronic kidney disease and callus(es) left foot and painful mycotic toenails b/l that are difficult to trim. Pain interferes with ambulation. Aggravating factors include wearing enclosed shoe gear. Pain is relieved with periodic professional debridement.   He denies any injuries to his feet. He does have shooting pain in feet on occasion, but this does not limit ambulation or interfere with his sleeping pattern.  Risk factors: DM, anemia, CKD, MI, chronic anticoagulation  Past Medical History:  Diagnosis Date  . Anemia   . Anxiety   . Arthritis   . Cataract   . Chronic kidney disease   . Depression   . Diabetes mellitus without complication ([place])    type II   . GERD (gastroesophageal reflux disease)   . Gout   . Heart murmur   . Hypertension   . Myocardial infarction ([place])    minor Mi- years ago -   . Varicose veins of left lower extremity     Patient Active Problem List   Diagnosis Date Noted  . Paget's disease of bone 10/23/2018  . Acute renal failure (ARF) ([place]) 06/11/2016  . Hyperglycemia 06/11/2016  . Deficiency anemia 12/09/2011  . Diabetes mellitus ([place]) 12/09/2011  . Renal insufficiency 12/09/2011    Past Surgical History:  Procedure Laterality Date  . BUNIONECTOMY    . cataract    . COLONOSCOPY WITH PROPOFOL N/A 05/28/2017   Procedure: COLONOSCOPY WITH PROPOFOL;  Surgeon: Carol Ada, MD;  Location: [facility];  Service: Endoscopy;  Laterality: N/A;  . ESOPHAGOGASTRODUODENOSCOPY (EGD) WITH PROPOFOL N/A 05/28/2017   Procedure: ESOPHAGOGASTRODUODENOSCOPY (EGD) WITH PROPOFOL;  Surgeon: Carol Ada, MD;  Location: [facility];  Service: Endoscopy;  Laterality: N/A;    Current Outpatient Medications on File Prior to Visit  Medication Sig Dispense Refill  . acetaminophen (ACETAMINOPHEN 8 HOUR) 650 MG CR tablet Take 650 mg by mouth daily.     Marland Kitchen allopurinol (ZYLOPRIM) 100 MG tablet TAKE 1 TABLET BY MOUTH TWICE DAILY 180 tablet 1  . amLODipine (NORVASC) 5 MG tablet TAKE 1 TABLET BY MOUTH DAILY 90 tablet 0  . aspirin 81 MG tablet Take 81 mg by mouth daily.    . B-D ULTRAFINE III SHORT PEN 31G X 8 MM MISC USE AS DIRECTED FOUR TIMES DAILY 100 each 3  . calcitRIOL (ROCALTROL) 0.25 MCG capsule Take 0.25 mcg by mouth See admin instructions. Monday Thursday Sunday,  Pt takes every 3rd day    . carboxymethylcellulose (REFRESH PLUS) 0.5 % SOLN 1 drop 3 (three) times daily as needed. As needed    . carvedilol (COREG) 12.5 MG tablet Take 12.5 mg by mouth 2 (two) times daily with a meal.    . ciclopirox (LOPROX) 0.77 % cream APPLY TOPICALLY TO BOTH FEET AND BETWEEN TOES TWICE DAILY FOR 4 WEEKS 30 g 1  . clopidogrel (PLAVIX) 75 MG tablet Take 1 tablet (75 mg total) by mouth daily. 90 tablet 2  . ferrous sulfate (QC FERROUS SULFATE) 325 (65 FE) MG tablet Take 325 mg by mouth daily with breakfast.    . furosemide (LASIX) 40 MG tablet Take 40 mg by mouth daily.    Marland Kitchen glucose blood (TRUE METRIX BLOOD GLUCOSE TEST) test strip Use as directed to check blood sugars 3 times per day dx: e11.65 300 each 5  . HYDROcodone-acetaminophen (NORCO) 5-325 MG tablet Take 1 tablet by mouth every 6 (six) hours  as needed for moderate pain. 12 tablet 0  . insulin degludec (TRESIBA FLEXTOUCH) 200 UNIT/ML FlexTouch Pen Inject 22 Units into the skin daily. 9 pen 0  . Insulin Pen Needle 32G X 8 MM MISC Use as directed 100 each 2  . Multiple Vitamins-Minerals (MULTIVITAMIN ADULT PO) Take by mouth daily.    . Omega-3 Fatty Acids (FISH OIL OMEGA-3 PO) Take 750 mg by mouth daily.    Marland Kitchen omeprazole (PRILOSEC) 40 MG capsule TAKE 1 CAPSULE BY MOUTH EVERY DAY BEFORE A MEAL 90 capsule 1  . ondansetron (ZOFRAN) 4 MG tablet Take 1 tablet (4 mg total) by mouth daily as needed for nausea or vomiting. 30 tablet 1  . rosuvastatin (CRESTOR) 20 MG tablet TAKE 1 TABLET BY MOUTH MONDAY-FRIDAY 75  tablet 1  . saw palmetto 500 MG capsule Take by mouth daily.    . Semaglutide,0.25 or 0.5MG /DOS, (OZEMPIC, 0.25 OR 0.5 MG/DOSE,) 2 MG/1.5ML SOPN Inject 0.5 mg into the skin once a week. 0.25 mg weekly    . sodium bicarbonate 650 MG tablet Take 1,300 mg by mouth 2 (two) times daily.    . TRUEPLUS LANCETS 30G MISC USE AS DIRECTED TO CHECK BLOOD SUGAR THREE TIMES DAILY 300 each 11  . UNABLE TO FIND USE UTD TO TEST BS TID     No current facility-administered medications on file prior to visit.     No Known Allergies  Social History   Occupational History  . Occupation: retired  Tobacco Use  . Smoking status: Former Smoker    Packs/day: 0.25    Years: 50.00    Pack years: 12.50    Types: Cigarettes  . Smokeless tobacco: Never Used  Substance and Sexual Activity  . Alcohol use: Yes    Comment: occassional wine beer  . Drug use: No  . Sexual activity: Not Currently    Family History  Problem Relation Age of Onset  . Breast cancer Mother   . Stomach cancer Mother   . Diabetes Mellitus II Maternal Aunt     Immunization History  Administered Date(s) Administered  . Influenza, High Dose Seasonal PF 05/25/2018, 04/19/2019  . Influenza-Unspecified 05/24/2013, 05/31/2018  . PFIZER SARS-COV-2 Vaccination 10/23/2019, 11/15/2019  . Pneumococcal Polysaccharide-23 05/24/2013, 05/24/2017     Objective: Vitals:   12/12/19 0849  Temp: 98 F (36.7 C)    Alec Solis is a/an 86 y.o. male  in NAD. AAO X 3.  Vascular Examination: Capillary refill time to digits immediate b/l. Palpable DP pulses b/l. Palpable PT pulses b/l. Pedal hair absent b/l Skin temperature gradient within normal limits b/l.  Dermatological Examination: Pedal skin is thin shiny, atrophic bilaterally. No open wounds bilaterally. No interdigital macerations bilaterally. Toenails 1-5 b/l elongated, dystrophic, thickened, crumbly with subungual debris and tenderness to dorsal palpation. Hyperkeratotic lesion(s) submet  head 3 left foot.  No erythema, no edema, no drainage, no flocculence.  Musculoskeletal Examination: Normal muscle strength 5/5 to all lower extremity muscle groups bilaterally, no pain crepitus or joint limitation noted with ROM b/l, bunion deformity noted b/l and hammertoes noted to the  2-5 bilaterally.  Neurological Examination: Protective sensation intact 5/5 intact bilaterally with 10g monofilament b/l. Vibratory sensation intact b/l. Clonus negative b/l.  Hemoglobin A1C Latest Ref Rng & Units 11/14/2019 08/07/2019 06/01/2019 04/19/2019 01/17/2019  HGBA1C 4.8 - 5.6 % 6.4(H) 7.8(H) 8.1(H) 8.4(H) 7.6(H)  Some recent data might be hidden   Assessment: 1. Pain due to onychomycosis of toenails of both feet   2.  Callus   3. Hallux valgus, acquired, bilateral   4. Diabetes mellitus due to underlying condition with stage 3 chronic kidney disease, with long-term current use of insulin, unspecified whether stage 3a or 3b CKD ([place])   5. Encounter for diabetic foot exam ([place])     Plan: -Diabetic foot examination performed on today's visit. -Continue diabetic foot care principles. Literature dispensed on today.  -Toenails 1-5 b/l were debrided in length and girth with sterile nail nippers and dremel without iatrogenic bleeding.  -Callus(es) submet head 3 left foot were debrided without complication or incident. Total number debrided =1. -Patient to continue soft, supportive shoe gear daily. -Patient to report any pedal injuries to medical professional immediately. -Patient/POA to call should there be question/concern in the interim.  Return in about 3 months (around 03/12/2020) for diabetic nail and callus trim.

## 2019-12-19 ENCOUNTER — Other Ambulatory Visit: Payer: Self-pay

## 2019-12-19 ENCOUNTER — Ambulatory Visit (HOSPITAL_COMMUNITY)
Admission: RE | Admit: 2019-12-19 | Discharge: 2019-12-19 | Disposition: A | Discharge status: Home / Self Care | Payer: Medicare Other | Source: Ambulatory Visit | Attending: Nephrology | Admitting: Nephrology

## 2019-12-19 VITALS — BP 97/73 | HR 85 | Temp 98.2°F | Resp 18

## 2019-12-19 DIAGNOSIS — N183 Chronic kidney disease, stage 3 unspecified: Secondary | ICD-10-CM | POA: Diagnosis not present

## 2019-12-19 DIAGNOSIS — N289 Disorder of kidney and ureter, unspecified: Secondary | ICD-10-CM

## 2019-12-19 DIAGNOSIS — D631 Anemia in chronic kidney disease: Secondary | ICD-10-CM | POA: Diagnosis not present

## 2019-12-19 LAB — IRON AND TIBC
Iron: 64 ug/dL (ref 45–182)
Saturation Ratios: 22 % (ref 17.9–39.5)
TIBC: 297 ug/dL (ref 250–450)
UIBC: 233 ug/dL

## 2019-12-19 LAB — RENAL FUNCTION PANEL
Albumin: 3.6 g/dL (ref 3.5–5.0)
Anion gap: 11 (ref 5–15)
BUN: 32 mg/dL — ABNORMAL HIGH (ref 8–23)
CO2: 21 mmol/L — ABNORMAL LOW (ref 22–32)
Calcium: 9.3 mg/dL (ref 8.9–10.3)
Chloride: 106 mmol/L (ref 98–111)
Creatinine, Ser: 1.95 mg/dL — ABNORMAL HIGH (ref 0.61–1.24)
GFR calc Af Amer: 36 mL/min — ABNORMAL LOW (ref >60)
GFR calc non Af Amer: 31 mL/min — ABNORMAL LOW (ref >60)
Glucose, Bld: 260 mg/dL — ABNORMAL HIGH (ref 70–99)
Phosphorus: 2.5 mg/dL (ref 2.5–4.6)
Potassium: 4.5 mmol/L (ref 3.5–5.1)
Sodium: 138 mmol/L (ref 135–145)

## 2019-12-19 LAB — FERRITIN: Ferritin: 1138 ng/mL — ABNORMAL HIGH (ref 24–336)

## 2019-12-19 LAB — POCT HEMOGLOBIN-HEMACUE: Hemoglobin: 10.4 g/dL — ABNORMAL LOW (ref 13.0–17.0)

## 2019-12-19 MED ORDER — EPOETIN ALFA 20000 UNIT/ML IJ SOLN
INTRAMUSCULAR | Status: AC
  Administered 2019-12-19: 20000 [IU] via SUBCUTANEOUS
  Filled 2019-12-19: qty 1

## 2019-12-19 MED ORDER — EPOETIN ALFA 10000 UNIT/ML IJ SOLN
30000.0000 [IU] | INTRAMUSCULAR | Status: DC
  Administered 2019-12-19: 10000 [IU] via SUBCUTANEOUS

## 2019-12-19 MED ORDER — EPOETIN ALFA 10000 UNIT/ML IJ SOLN
INTRAMUSCULAR | Status: AC
  Filled 2019-12-19: qty 1

## 2019-12-20 LAB — PTH, INTACT AND CALCIUM
Calcium, Total (PTH): 9.3 mg/dL (ref 8.6–10.2)
PTH: 68 pg/mL — ABNORMAL HIGH (ref 15–65)

## 2019-12-28 ENCOUNTER — Other Ambulatory Visit: Payer: Self-pay | Admitting: Internal Medicine

## 2020-01-02 ENCOUNTER — Ambulatory Visit (INDEPENDENT_AMBULATORY_CARE_PROVIDER_SITE_OTHER): Payer: Medicare Other | Admitting: Internal Medicine

## 2020-01-02 ENCOUNTER — Encounter (HOSPITAL_COMMUNITY)
Admission: RE | Admit: 2020-01-02 | Discharge: 2020-01-02 | Disposition: A | Discharge status: Home / Self Care | Payer: Medicare Other | Source: Ambulatory Visit | Attending: Nephrology | Admitting: Nephrology

## 2020-01-02 ENCOUNTER — Ambulatory Visit: Payer: Self-pay

## 2020-01-02 ENCOUNTER — Encounter: Payer: Self-pay | Admitting: Internal Medicine

## 2020-01-02 ENCOUNTER — Other Ambulatory Visit: Payer: Self-pay

## 2020-01-02 VITALS — BP 120/82 | HR 86 | Temp 98.8°F | Ht 72.0 in | Wt 192.6 lb

## 2020-01-02 VITALS — BP 145/81 | HR 76 | Temp 95.5°F | Resp 18

## 2020-01-02 DIAGNOSIS — I10 Essential (primary) hypertension: Secondary | ICD-10-CM

## 2020-01-02 DIAGNOSIS — N289 Disorder of kidney and ureter, unspecified: Secondary | ICD-10-CM | POA: Diagnosis not present

## 2020-01-02 DIAGNOSIS — E1122 Type 2 diabetes mellitus with diabetic chronic kidney disease: Secondary | ICD-10-CM

## 2020-01-02 DIAGNOSIS — K219 Gastro-esophageal reflux disease without esophagitis: Secondary | ICD-10-CM

## 2020-01-02 DIAGNOSIS — Z79899 Other long term (current) drug therapy: Secondary | ICD-10-CM | POA: Diagnosis not present

## 2020-01-02 DIAGNOSIS — N183 Chronic kidney disease, stage 3 unspecified: Secondary | ICD-10-CM

## 2020-01-02 LAB — POCT HEMOGLOBIN-HEMACUE: Hemoglobin: 10.4 g/dL — ABNORMAL LOW (ref 13.0–17.0)

## 2020-01-02 MED ORDER — EPOETIN ALFA 40000 UNIT/ML IJ SOLN: 30000.0000 [IU] | INTRAMUSCULAR | Status: DC

## 2020-01-02 MED ORDER — EPOETIN ALFA 10000 UNIT/ML IJ SOLN
INTRAMUSCULAR | Status: AC
  Administered 2020-01-02: 10000 [IU] via SUBCUTANEOUS
  Filled 2020-01-02: qty 1

## 2020-01-02 MED ORDER — EPOETIN ALFA 20000 UNIT/ML IJ SOLN
INTRAMUSCULAR | Status: AC
  Administered 2020-01-02: 20000 [IU] via SUBCUTANEOUS
  Filled 2020-01-02: qty 1

## 2020-01-02 NOTE — Patient Instructions (Signed)
PLEASE STOP OZEMPIC - CONTINUE TO KEEP BLOOD SUGAR LOG  STOP OMEPRAZOLE  START TAKING [place] ONCE DAILY  CALL Thursday - LET us KNOW HOW YOU ARE DOING WITH THE Alec Solis

## 2020-01-04 ENCOUNTER — Ambulatory Visit: Payer: Self-pay

## 2020-01-09 ENCOUNTER — Ambulatory Visit: Payer: Self-pay

## 2020-01-13 NOTE — Progress Notes (Signed)
This visit occurred during the SARS-CoV-2 public health emergency.  Safety protocols were in place, including screening questions prior to the visit, additional usage of staff PPE, and extensive cleaning of exam room while observing appropriate contact time as indicated for disinfecting solutions.  Subjective:     Patient ID: Alec Solis , male    DOB: May 12, 1933 , 86 y.o.   MRN: [id number]   Chief Complaint  Patient presents with  . Diabetes    HPI  He is here today for f/u diabetes. He was taken off of Ozempic at his last visit due to n/v and persistent abdominal pain. Unfortunately, he did NOT stop the Ozempic as instructed (even though he was advised this medication was likely contributing to his symptoms). He is still having abdominal discomfort.   He was also started on Dexilant at his last visit, to replace the omeprazole. He reports he only took this for two days.     Past Medical History:  Diagnosis Date  . Anemia   . Anxiety   . Arthritis   . Cataract   . Chronic kidney disease   . Depression   . Diabetes mellitus without complication ([place])    type II   . GERD (gastroesophageal reflux disease)   . Gout   . Heart murmur   . Hypertension   . Myocardial infarction ([place])    minor Mi- years ago -   . Varicose veins of left lower extremity      Family History  Problem Relation Age of Onset  . Breast cancer Mother   . Stomach cancer Mother   . Diabetes Mellitus II Maternal Aunt      Current Outpatient Medications:  .  acetaminophen (ACETAMINOPHEN 8 HOUR) 650 MG CR tablet, Take 650 mg by mouth daily., Disp: , Rfl:  .  allopurinol (ZYLOPRIM) 100 MG tablet, TAKE 1 TABLET BY MOUTH TWICE DAILY, Disp: 180 tablet, Rfl: 1 .  amLODipine (NORVASC) 5 MG tablet, TAKE 1 TABLET BY MOUTH DAILY, Disp: 90 tablet, Rfl: 0 .  aspirin 81 MG tablet, Take 81 mg by mouth daily., Disp: , Rfl:  .  B-D ULTRAFINE III SHORT PEN 31G X 8 MM MISC, USE AS DIRECTED FOUR TIMES DAILY, Disp: 100  each, Rfl: 3 .  calcitRIOL (ROCALTROL) 0.25 MCG capsule, Take 0.25 mcg by mouth See admin instructions. Monday Thursday Sunday,  Pt takes every 3rd day, Disp: , Rfl:  .  carboxymethylcellulose (REFRESH PLUS) 0.5 % SOLN, 1 drop 3 (three) times daily as needed. As needed, Disp: , Rfl:  .  carvedilol (COREG) 12.5 MG tablet, Take 12.5 mg by mouth 2 (two) times daily with a meal., Disp: , Rfl:  .  ciclopirox (LOPROX) 0.77 % cream, APPLY TOPICALLY TO BOTH FEET AND BETWEEN TOES TWICE DAILY FOR 4 WEEKS, Disp: 30 g, Rfl: 1 .  clopidogrel (PLAVIX) 75 MG tablet, Take 1 tablet (75 mg total) by mouth daily., Disp: 90 tablet, Rfl: 2 .  ferrous sulfate (QC FERROUS SULFATE) 325 (65 FE) MG tablet, Take 325 mg by mouth daily with breakfast., Disp: , Rfl:  .  furosemide (LASIX) 40 MG tablet, Take 40 mg by mouth daily., Disp: , Rfl:  .  glucose blood (TRUE METRIX BLOOD GLUCOSE TEST) test strip, Use as directed to check blood sugars 3 times per day dx: e11.65, Disp: 300 each, Rfl: 5 .  HYDROcodone-acetaminophen (NORCO) 5-325 MG tablet, Take 1 tablet by mouth every 6 (six) hours as needed for moderate pain., Disp:  12 tablet, Rfl: 0 .  insulin degludec (TRESIBA FLEXTOUCH) 200 UNIT/ML FlexTouch Pen, Inject 22 Units into the skin daily., Disp: 9 pen, Rfl: 0 .  Insulin Pen Needle 32G X 8 MM MISC, Use as directed, Disp: 100 each, Rfl: 2 .  Multiple Vitamins-Minerals (MULTIVITAMIN ADULT PO), Take by mouth daily., Disp: , Rfl:  .  Omega-3 Fatty Acids (FISH OIL OMEGA-3 PO), Take 750 mg by mouth daily., Disp: , Rfl:  .  omeprazole (PRILOSEC) 40 MG capsule, TAKE 1 CAPSULE BY MOUTH EVERY DAY BEFORE A MEAL, Disp: 90 capsule, Rfl: 1 .  ondansetron (ZOFRAN) 4 MG tablet, Take 1 tablet (4 mg total) by mouth daily as needed for nausea or vomiting., Disp: 30 tablet, Rfl: 1 .  rosuvastatin (CRESTOR) 20 MG tablet, TAKE 1 TABLET BY MOUTH MONDAY-FRIDAY, Disp: 75 tablet, Rfl: 1 .  saw palmetto 500 MG capsule, Take by mouth daily., Disp: , Rfl:   .  Semaglutide,0.25 or 0.5MG /DOS, (OZEMPIC, 0.25 OR 0.5 MG/DOSE,) 2 MG/1.5ML SOPN, Inject 0.5 mg into the skin once a week. 0.25 mg weekly, Disp: , Rfl:  .  sodium bicarbonate 650 MG tablet, Take 1,300 mg by mouth 2 (two) times daily., Disp: , Rfl:  .  TRUEPLUS LANCETS 30G MISC, USE AS DIRECTED TO CHECK BLOOD SUGAR THREE TIMES DAILY, Disp: 300 each, Rfl: 11 .  UNABLE TO FIND, USE UTD TO TEST BS TID, Disp: , Rfl:    No Known Allergies   Review of Systems  Constitutional: Negative.   Respiratory: Negative.   Cardiovascular: Negative.   Gastrointestinal: Positive for abdominal distention and nausea.  Neurological: Negative.   Psychiatric/Behavioral: Negative.      Today's Vitals   01/02/20 1059  BP: 120/82  Pulse: 86  Temp: 98.8 F (37.1 C)  TempSrc: Oral  SpO2: 94%  Weight: 192 lb 9.6 oz (87.4 kg)  Height: 6' (1.829 m)  PainSc: 0-No pain   Body mass index is 26.12 kg/m.   Objective:  Physical Exam Vitals and nursing note reviewed.  Constitutional:      Appearance: Normal appearance.  Cardiovascular:     Rate and Rhythm: Normal rate and regular rhythm.     Heart sounds: Normal heart sounds.  Pulmonary:     Effort: Pulmonary effort is normal.     Breath sounds: Normal breath sounds.  Skin:    General: Skin is warm.  Neurological:     General: No focal deficit present.     Mental Status: He is alert.  Psychiatric:        Mood and Affect: Mood normal.         Assessment And Plan:     1. Diabetes mellitus with stage 3 chronic kidney disease ([place])  He was instructed to [place]. Advised his sugars will go up due to discontinuation of the medication. I will adjust [name] accordingly. He is advised to call weekly with sugar readings so his dose can be adjusted accordingly. This case also discussed with [facility] pharmacist, she agrees to follow him closely as well. I spent more than 25 minutes with the patient during face to face visit. I also spent another 5 minutes  in discussion with [facility] pharmacist, and she was able to meet with the patient personally at the end of our office visit. He will rto in 4-6 weeks for re-evaluation. He is reminded to keep Bs log.   2. Gastroesophageal reflux disease without esophagitis  Chronic. He is advised to take [place]  of omeprazole.   3. Drug therapy  I personally spent 30 minutes face-to-face and non-face-to-face in the care of this patient, which includes all pre-, intra-, and post visit time on the date of service.    Maximino Greenland, MD    THE PATIENT IS ENCOURAGED TO PRACTICE SOCIAL DISTANCING DUE TO THE COVID-19 PANDEMIC.

## 2020-01-16 ENCOUNTER — Encounter (HOSPITAL_COMMUNITY)
Admission: RE | Admit: 2020-01-16 | Discharge: 2020-01-16 | Disposition: A | Discharge status: Home / Self Care | Payer: Medicare Other | Source: Ambulatory Visit | Attending: Nephrology | Admitting: Nephrology

## 2020-01-16 ENCOUNTER — Other Ambulatory Visit: Payer: Self-pay

## 2020-01-16 ENCOUNTER — Encounter (HOSPITAL_COMMUNITY): Payer: Medicare Other

## 2020-01-16 ENCOUNTER — Ambulatory Visit: Payer: Self-pay

## 2020-01-16 VITALS — BP 148/90 | HR 81 | Temp 96.5°F | Resp 20

## 2020-01-16 DIAGNOSIS — N289 Disorder of kidney and ureter, unspecified: Secondary | ICD-10-CM | POA: Diagnosis not present

## 2020-01-16 LAB — POCT HEMOGLOBIN-HEMACUE: Hemoglobin: 10 g/dL — ABNORMAL LOW (ref 13.0–17.0)

## 2020-01-16 MED ORDER — EPOETIN ALFA 10000 UNIT/ML IJ SOLN: 30000.0000 [IU] | INTRAMUSCULAR | Status: DC

## 2020-01-16 MED ORDER — EPOETIN ALFA 10000 UNIT/ML IJ SOLN
INTRAMUSCULAR | Status: AC
  Administered 2020-01-16: 10000 [IU] via SUBCUTANEOUS
  Filled 2020-01-16: qty 1

## 2020-01-16 MED ORDER — EPOETIN ALFA 20000 UNIT/ML IJ SOLN
INTRAMUSCULAR | Status: AC
  Administered 2020-01-16: 20000 [IU] via SUBCUTANEOUS
  Filled 2020-01-16: qty 1

## 2020-01-21 NOTE — Patient Instructions (Signed)
Visit Information  Goals Addressed            This Visit's Progress   . Diabetes Management       CARE PLAN ENTRY  Current Barriers:  . Diabetes: Controlled Lab Results  Component Value Date   HGBA1C 6.4 (H) 11/14/2019 .   Lab Results  Component Value Date   CREATININE 1.95 (H) 12/19/2019   CREATININE 2.14 (H) 12/07/2019   CREATININE 1.78 (H) 11/07/2019 .   Marland Kitchen No results found for: EGFR . Current antihyperglycemic regimen: Tresiba 22 units in the evening . Denies hypoglycemic symptoms, including dizziness, lightheadedness, shaking, sweating . Denies hyperglycemic symptoms, including polyuria, polydipsia, polyphagia, nocturia, blurred vision, neuropathy . Current meal patterns: o Breakfast and dinner only . Current exercise: Did not assess . Current blood glucose readings:  o AM: 117, 104, 88, 103, 96, 90, 118 o PM (before dinner): 79, 84, 95, 89, 122, 111, 104  Pharmacist Clinical Goal(s):  Marland Kitchen Over the next 30 days, patient will work with PharmD and primary care provider to address diabetes management after stopping treatment with Ozempic  Interventions: . Advised patient to continue checking blood sugar twice daily  . Will follow up in 1 week  Patient Self Care Activities:  . Patient will check blood glucose twice, document, and provide at future appointments . Patient will take medications as prescribed . Patient will contact provider with any episodes of hypoglycemia . Patient will report any questions or concerns to provider   Please see past updates related to this goal by clicking on the "Past Updates" button in the selected goal         Patient verbalizes understanding of instructions provided today.   The pharmacy team will reach out to the patient again over the next 7 days.   Jannette Fogo, PharmD Clinical Pharmacist [facility] [phone]

## 2020-01-21 NOTE — Chronic Care Management (AMB) (Signed)
  Chronic Care Management   Note   Name: Alec Solis MRN: [id number] DOB: [birth year]  Patient contacted PharmD to provide most recent blood glucose readings.  Diabetes   Recent Relevant Labs: Lab Results  Component Value Date/Time   HGBA1C 6.4 (H) 11/14/2019 04:02 PM   HGBA1C 7.8 (H) 08/07/2019 11:58 AM   MICROALBUR 30 01/17/2019 12:39 PM    Checking BG: Daily  Recent FBG Readings: 117, 104, 88, 103, 96, 90, 118 Recent pre-meal BG readings: 79, 84, 95, 89, 122, 111, 104 Recent 2hr PP BG readings:  Recent HS BG readings:  Patient has failed these meds in past: Ozempic, Farxiga, Lantus, Tradjenta, [place] Patient is currently controlled on the following medications:  -Tresiba 22 units in the evening  Last diabetic Foot exam:  Last diabetic Eye exam: Lab Results  Component Value Date/Time   HMDIABEYEEXA No Retinopathy 03/08/2019 12:00 AM    We discussed: -Pt checking BG twice daily -Last dose of Ozempic was 12/28/19(discontinued due to significant nausea and vomiting) -Pt reports only eating 2 meals per day (breakfast and dinner) due to nausea/vomiting -Pt has used Novolog sliding scale in the past and has pens at home that are not expired -Pt denies episodes of hypoglycemia -Explained to pt that Ozempic has an extended half life and will take several weeks to get out of his system which is why we will continue to monitor BG -Current BG readings are within goal  Plan -Continue current medications  -Will not start Novolog at this time. Will assess patient's BG readings in 1 week to determine need for mealtime insulin   Follow up plan: Contact patient on 6/1 to assess BG readings  Jannette Fogo, PharmD Clinical Pharmacist [facility] [phone]

## 2020-01-21 NOTE — Chronic Care Management (AMB) (Signed)
  Chronic Care Management   Note   Name: Alec Solis MRN: [id number] DOB: 1933/07/12  Patient contacted PharmD to provide most recent blood glucose readings.  Diabetes   Recent Relevant Labs: Lab Results  Component Value Date/Time   HGBA1C 6.4 (H) 11/14/2019 04:02 PM   HGBA1C 7.8 (H) 08/07/2019 11:58 AM   MICROALBUR 30 01/17/2019 12:39 PM    Checking BG: Daily  Recent FBG Readings: 89, 90, 96, 98, 119, 111 Recent pre-meal BG readings: 124, 107, 137, 114, 102 Recent 2hr PP BG readings:  Recent HS BG readings:  Patient has failed these meds in past: Ozempic, Farxiga, Lantus, [place], [place] Patient is currently controlled on the following medications:  -Tresiba 22 units in the evening  Last diabetic Foot exam:  Last diabetic Eye exam: Lab Results  Component Value Date/Time   HMDIABEYEEXA No Retinopathy 03/08/2019 12:00 AM    We discussed: -Pt reports checking BG twice daily -Last dose of Ozempic was 12/28/19(discontinued due to significant nausea and vomiting) -Pt reports only eating 2 meals per day (breakfast and dinner) due to nausea/vomiting -Pt has used Novolog sliding scale in the past and has pens at home that are not expired -Pt denies episodes of hypoglycemia -Explained to pt that Ozempic has an extended half life and will take several weeks to get out of his system which is why we will continue to monitor BG -Current BG readings are within goal  Plan -Continue current medications  -Will not start Novolog at this time. Will assess patient's BG readings in 1 week to determine need for mealtime insulin   Follow up plan: Contact patient on 5/25 to assess BG readings  Jannette Fogo, PharmD Clinical Pharmacist [facility] [phone]

## 2020-01-21 NOTE — Chronic Care Management (AMB) (Signed)
  Chronic Care Management   Note   Name: Alec Solis MRN: [id number] DOB: 03-06-1933  Patient contacted PharmD to provide most recent blood glucose readings.  Diabetes   Recent Relevant Labs: Lab Results  Component Value Date/Time   HGBA1C 6.4 (H) 11/14/2019 04:02 PM   HGBA1C 7.8 (H) 08/07/2019 11:58 AM   MICROALBUR 30 01/17/2019 12:39 PM    Checking BG: Daily  Recent FBG Readings: 127, 121, 135, 102, 125, 130, 115, 103, 131, 153, 106, 112, 106 Recent pre-meal BG readings:  Recent 2hr PP BG readings:  Recent HS BG readings:  Patient has failed these meds in past: Ozempic, Farxiga, Lantus, Tradjenta, [place] Patient is currently controlled on the following medications:  -Tresiba 22 units in the evening  Last diabetic Foot exam:  Last diabetic Eye exam: Lab Results  Component Value Date/Time   HMDIABEYEEXA No Retinopathy 03/08/2019 12:00 AM    We discussed: -Pt reports checking BG every morning -Last dose of Ozempic was 12/28/19(discontinued due to significant nausea and vomiting) -Pt reports only eating 2 meals per day (breakfast and dinner) due to nausea/vomiting -Pt has used Novolog sliding scale in the past and has some pens at home that are not expired -Pt denies episodes of hypoglycemia -Instructed pt to check BG twice daily until follow up call  -Explained to pt that Ozempic has an extended half life and will take several weeks to get out of his system which is why we will continue to monitor BG  Plan -Continue current medications  -Will not start Novolog at this time. Will assess patient's morning and evening BG readings within the next week to determine need for mealtime insulin   Follow up plan: Contact patient on 5/18 to assess BG readings  Jannette Fogo, PharmD Clinical Pharmacist [facility] [phone]

## 2020-01-21 NOTE — Patient Instructions (Signed)
Visit Information  Goals Addressed            This Visit's Progress   . Diabetes Management       CARE PLAN ENTRY   Current Barriers:  . Diabetes: Controlled Lab Results  Component Value Date   HGBA1C 6.4 (H) 11/14/2019 .   Lab Results  Component Value Date   CREATININE 1.95 (H) 12/19/2019   CREATININE 2.14 (H) 12/07/2019   CREATININE 1.78 (H) 11/07/2019 .   Marland Kitchen No results found for: EGFR . Current antihyperglycemic regimen: Tresiba 22 units in the evening . Denies hypoglycemic symptoms, including dizziness, lightheadedness, shaking, sweating . Denies hyperglycemic symptoms, including polyuria, polydipsia, polyphagia, nocturia, blurred vision, neuropathy . Current meal patterns: o Breakfast and dinner only . Current exercise: Did not assess . Current blood glucose readings: 127, 121, 135, 102, 125, 130, 115, 103, 131, 153, 106, 112, 106  Pharmacist Clinical Goal(s):  Marland Kitchen Over the next 30 days, patient will work with PharmD and primary care provider to address diabetes management after stopping treatment with Ozempic  Interventions: . Advised patient to check blood sugar twice daily to help determine the need for meal time insulin  Patient Self Care Activities:  . Patient will check blood glucose twice, document, and provide at future appointments . Patient will take medications as prescribed . Patient will contact provider with any episodes of hypoglycemia . Patient will report any questions or concerns to provider   Please see past updates related to this goal by clicking on the "Past Updates" button in the selected goal         Patient verbalizes understanding of instructions provided today.   The pharmacy team will reach out to the patient again over the next 7 days.   Jannette Fogo, PharmD Clinical Pharmacist [facility] [phone]

## 2020-01-21 NOTE — Patient Instructions (Signed)
Visit Information  Goals Addressed            This Visit's Progress   . Diabetes Management       CARE PLAN ENTRY  Current Barriers:  . Diabetes: Controlled Lab Results  Component Value Date   HGBA1C 6.4 (H) 11/14/2019 .   Lab Results  Component Value Date   CREATININE 1.95 (H) 12/19/2019   CREATININE 2.14 (H) 12/07/2019   CREATININE 1.78 (H) 11/07/2019 .   Marland Kitchen No results found for: EGFR . Current antihyperglycemic regimen: Tresiba 22 units in the evening . Denies hypoglycemic symptoms, including dizziness, lightheadedness, shaking, sweating . Denies hyperglycemic symptoms, including polyuria, polydipsia, polyphagia, nocturia, blurred vision, neuropathy . Current meal patterns: o Breakfast and dinner only . Current exercise: Did not assess . Current blood glucose readings:  o AM: 89, 90, 96, 98, 119, 111 o PM (before dinner): 124, 107, 137, 114, 102  Pharmacist Clinical Goal(s):  Marland Kitchen Over the next 30 days, patient will work with PharmD and primary care provider to address diabetes management after stopping treatment with Ozempic  Interventions: . Advised patient to continue checking blood sugar twice daily  . Will follow up in 1 week  Patient Self Care Activities:  . Patient will check blood glucose twice, document, and provide at future appointments . Patient will take medications as prescribed . Patient will contact provider with any episodes of hypoglycemia . Patient will report any questions or concerns to provider   Please see past updates related to this goal by clicking on the "Past Updates" button in the selected goal         Patient verbalizes understanding of instructions provided today.   The pharmacy team will reach out to the patient again over the next 7 days.   Jannette Fogo, PharmD Clinical Pharmacist [facility] [phone]

## 2020-01-21 NOTE — Chronic Care Management (AMB) (Signed)
  Chronic Care Management   Note  01/21/2020 Name: Alec Solis MRN: [id number] DOB: [birth year]  Alec Solis is a 86 y.o. year old male who is a primary care patient of [name], MD. I reached out to [name] by phone today in response to a referral sent by Alec Solis's PCP, [name], MD.   Alec Solis was given information about Chronic Care Management services today including:  1. CCM service includes personalized support from designated clinical staff supervised by his physician, including individualized plan of care and coordination with other care providers 2. 24/7 contact phone numbers for assistance for urgent and routine care needs. 3. Service will only be billed when office clinical staff spend 20 minutes or more in a month to coordinate care. 4. Only one practitioner may furnish and bill the service in a calendar month. 5. The patient may stop CCM services at any time (effective at the end of the month) by phone call to the office staff.   Patient agreed to services and verbal consent obtained.    Patient referred today by PCP for management of diabetes. Pt is stopping Ozempic and may need additional of meal time insulin  Diabetes   Recent Relevant Labs: Lab Results  Component Value Date/Time   HGBA1C 6.4 (H) 11/14/2019 04:02 PM   HGBA1C 7.8 (H) 08/07/2019 11:58 AM   MICROALBUR 30 01/17/2019 12:39 PM    Checking BG: Daily  Recent FBG Readings: Recent pre-meal BG readings:  Recent 2hr PP BG readings:  Recent HS BG readings:  Patient has failed these meds in past: Ozempic, Farxiga, Lantus, Tradjenta, [place] Patient is currently controlled on the following medications:  -Tresiba 22 units in the evening  Last diabetic Foot exam:  Last diabetic Eye exam: Lab Results  Component Value Date/Time   HMDIABEYEEXA No Retinopathy 03/08/2019 12:00 AM    We discussed: -Pt reports checking BG every morning -Ozempic is being discontinued due to pt  experiencing significant nausea and vomiting 2-3 times weekly -Last dose of Ozempic was 12/28/19 -Pt reports only eating 2 meals per day (breakfast and dinner) due to nausea/vomiting -Pt will call and provide most recent BG readings -Pt has used Novolog sliding scale in the past and has some pens at home that are not expired -Pt denies episodes of hypoglycemia  Plan -Continue current medications   Follow up plan: Will assess blood sugar values weekly and adjust insulin dosing as needed until initial CCM visit can be scheduled  Jannette Fogo, PharmD Clinical Pharmacist [facility] [phone]

## 2020-01-21 NOTE — Patient Instructions (Signed)
Visit Information  Goals Addressed            This Visit's Progress   . Diabetes Management       CARE PLAN ENTRY   Current Barriers:  . Diabetes: Controlled Lab Results  Component Value Date   HGBA1C 6.4 (H) 11/14/2019 .   Lab Results  Component Value Date   CREATININE 1.95 (H) 12/19/2019   CREATININE 2.14 (H) 12/07/2019   CREATININE 1.78 (H) 11/07/2019 .   Marland Kitchen No results found for: EGFR . Current antihyperglycemic regimen: Tresiba 22 units in the evening . Denies hypoglycemic symptoms, including dizziness, lightheadedness, shaking, sweating . Denies hyperglycemic symptoms, including polyuria, polydipsia, polyphagia, nocturia, blurred vision, neuropathy . Current meal patterns: o Breakfast and dinner only . Current exercise: Did not assess . Current blood glucose readings: Patient will call to provide  Pharmacist Clinical Goal(s):  Marland Kitchen Over the next 30 days, patient will work with PharmD and primary care provider to address diabetes management after stopping treatment with Ozempic  Interventions: . Instructed patient to provide most recent blood glucose readings when he gets home . Instructed patient to check expiration date on Novolog pens in case meal time insulin is needed  Patient Self Care Activities:  . Patient will check blood glucose once, document, and provide at future appointments . Patient will take medications as prescribed . Patient will contact provider with any episodes of hypoglycemia . Patient will report any questions or concerns to provider   Initial goal documentation        Alec Solis was given information about Chronic Care Management services today including:  1. CCM service includes personalized support from designated clinical staff supervised by his physician, including individualized plan of care and coordination with other care providers 2. 24/7 contact phone numbers for assistance for urgent and routine care needs. 3. Standard  insurance, coinsurance, copays and deductibles apply for chronic care management only during months in which we provide at least 20 minutes of these services. Most insurances cover these services at 100%, however patients may be responsible for any copay, coinsurance and/or deductible if applicable. This service may help you avoid the need for more expensive face-to-face services. 4. Only one practitioner may furnish and bill the service in a calendar month. 5. The patient may stop CCM services at any time (effective at the end of the month) by phone call to the office staff.  Patient agreed to services and verbal consent obtained.   Patient verbalizes understanding of instructions provided today.  The pharmacy team will reach out to the patient again over the next 7 days.   Jannette Fogo, PharmD Clinical Pharmacist [facility] [phone]

## 2020-01-23 ENCOUNTER — Other Ambulatory Visit: Payer: Self-pay | Admitting: Internal Medicine

## 2020-01-23 ENCOUNTER — Ambulatory Visit: Payer: Medicare Other

## 2020-01-23 DIAGNOSIS — N183 Chronic kidney disease, stage 3 unspecified: Secondary | ICD-10-CM

## 2020-01-23 DIAGNOSIS — I10 Essential (primary) hypertension: Secondary | ICD-10-CM

## 2020-01-30 ENCOUNTER — Other Ambulatory Visit: Payer: Self-pay

## 2020-01-30 ENCOUNTER — Other Ambulatory Visit: Payer: Self-pay | Admitting: Internal Medicine

## 2020-01-30 ENCOUNTER — Ambulatory Visit (HOSPITAL_COMMUNITY)
Admission: RE | Admit: 2020-01-30 | Discharge: 2020-01-30 | Disposition: A | Discharge status: Home / Self Care | Payer: Medicare Other | Source: Ambulatory Visit | Attending: Nephrology | Admitting: Nephrology

## 2020-01-30 VITALS — BP 134/65 | HR 43 | Temp 96.2°F | Resp 20

## 2020-01-30 DIAGNOSIS — N183 Chronic kidney disease, stage 3 unspecified: Secondary | ICD-10-CM | POA: Insufficient documentation

## 2020-01-30 DIAGNOSIS — N289 Disorder of kidney and ureter, unspecified: Secondary | ICD-10-CM

## 2020-01-30 DIAGNOSIS — D631 Anemia in chronic kidney disease: Secondary | ICD-10-CM | POA: Insufficient documentation

## 2020-01-30 LAB — RENAL FUNCTION PANEL
Albumin: 3.7 g/dL (ref 3.5–5.0)
Anion gap: 11 (ref 5–15)
BUN: 29 mg/dL — ABNORMAL HIGH (ref 8–23)
CO2: 21 mmol/L — ABNORMAL LOW (ref 22–32)
Calcium: 9 mg/dL (ref 8.9–10.3)
Chloride: 107 mmol/L (ref 98–111)
Creatinine, Ser: 2.08 mg/dL — ABNORMAL HIGH (ref 0.61–1.24)
GFR calc Af Amer: 33 mL/min — ABNORMAL LOW (ref >60)
GFR calc non Af Amer: 28 mL/min — ABNORMAL LOW (ref >60)
Glucose, Bld: 167 mg/dL — ABNORMAL HIGH (ref 70–99)
Phosphorus: 3.2 mg/dL (ref 2.5–4.6)
Potassium: 4.8 mmol/L (ref 3.5–5.1)
Sodium: 139 mmol/L (ref 135–145)

## 2020-01-30 LAB — IRON AND TIBC
Iron: 53 ug/dL (ref 45–182)
Saturation Ratios: 17 % — ABNORMAL LOW (ref 17.9–39.5)
TIBC: 321 ug/dL (ref 250–450)
UIBC: 268 ug/dL

## 2020-01-30 LAB — FERRITIN: Ferritin: 944 ng/mL — ABNORMAL HIGH (ref 24–336)

## 2020-01-30 LAB — POCT HEMOGLOBIN-HEMACUE: Hemoglobin: 9.4 g/dL — ABNORMAL LOW (ref 13.0–17.0)

## 2020-01-30 MED ORDER — EPOETIN ALFA 20000 UNIT/ML IJ SOLN
INTRAMUSCULAR | Status: AC
  Administered 2020-01-30: 20000 [IU] via SUBCUTANEOUS
  Filled 2020-01-30: qty 1

## 2020-01-30 MED ORDER — EPOETIN ALFA 40000 UNIT/ML IJ SOLN: 30000.0000 [IU] | INTRAMUSCULAR | Status: DC

## 2020-01-30 MED ORDER — EPOETIN ALFA 10000 UNIT/ML IJ SOLN
INTRAMUSCULAR | Status: AC
  Administered 2020-01-30: 10000 [IU] via SUBCUTANEOUS
  Filled 2020-01-30: qty 1

## 2020-01-31 LAB — PTH, INTACT AND CALCIUM
Calcium, Total (PTH): 9.1 mg/dL (ref 8.6–10.2)
PTH: 74 pg/mL — ABNORMAL HIGH (ref 15–65)

## 2020-02-01 ENCOUNTER — Other Ambulatory Visit: Payer: Self-pay | Admitting: Internal Medicine

## 2020-02-02 ENCOUNTER — Ambulatory Visit: Payer: Self-pay

## 2020-02-02 DIAGNOSIS — I10 Essential (primary) hypertension: Secondary | ICD-10-CM

## 2020-02-02 DIAGNOSIS — N183 Chronic kidney disease, stage 3 unspecified: Secondary | ICD-10-CM

## 2020-02-13 ENCOUNTER — Ambulatory Visit (HOSPITAL_COMMUNITY)
Admission: RE | Admit: 2020-02-13 | Discharge: 2020-02-13 | Disposition: A | Discharge status: Home / Self Care | Payer: Medicare Other | Source: Ambulatory Visit | Attending: Nephrology | Admitting: Nephrology

## 2020-02-13 ENCOUNTER — Other Ambulatory Visit: Payer: Self-pay

## 2020-02-13 VITALS — BP 123/91 | HR 76 | Temp 97.2°F | Resp 18

## 2020-02-13 DIAGNOSIS — N289 Disorder of kidney and ureter, unspecified: Secondary | ICD-10-CM | POA: Diagnosis not present

## 2020-02-13 LAB — POCT HEMOGLOBIN-HEMACUE: Hemoglobin: 8.6 g/dL — ABNORMAL LOW (ref 13.0–17.0)

## 2020-02-13 MED ORDER — EPOETIN ALFA 10000 UNIT/ML IJ SOLN
INTRAMUSCULAR | Status: AC
  Administered 2020-02-13: 10000 [IU]
  Filled 2020-02-13: qty 1

## 2020-02-13 MED ORDER — EPOETIN ALFA 40000 UNIT/ML IJ SOLN: 30000.0000 [IU] | INTRAMUSCULAR | Status: DC

## 2020-02-13 MED ORDER — EPOETIN ALFA 20000 UNIT/ML IJ SOLN
INTRAMUSCULAR | Status: AC
  Administered 2020-02-13: 20000 [IU]
  Filled 2020-02-13: qty 1

## 2020-02-20 ENCOUNTER — Other Ambulatory Visit: Payer: Self-pay

## 2020-02-20 ENCOUNTER — Ambulatory Visit (INDEPENDENT_AMBULATORY_CARE_PROVIDER_SITE_OTHER): Payer: Medicare Other | Admitting: Internal Medicine

## 2020-02-20 ENCOUNTER — Encounter: Payer: Self-pay | Admitting: Internal Medicine

## 2020-02-20 VITALS — BP 122/62 | HR 73 | Temp 98.9°F | Ht 72.0 in | Wt 191.6 lb

## 2020-02-20 DIAGNOSIS — K921 Melena: Secondary | ICD-10-CM | POA: Diagnosis not present

## 2020-02-20 DIAGNOSIS — E1122 Type 2 diabetes mellitus with diabetic chronic kidney disease: Secondary | ICD-10-CM

## 2020-02-20 DIAGNOSIS — K219 Gastro-esophageal reflux disease without esophagitis: Secondary | ICD-10-CM | POA: Diagnosis not present

## 2020-02-20 DIAGNOSIS — N183 Chronic kidney disease, stage 3 unspecified: Secondary | ICD-10-CM | POA: Diagnosis not present

## 2020-02-20 LAB — POCT UA - MICROALBUMIN
Creatinine, POC: 200 mg/dL
Microalbumin Ur, POC: 80 mg/L

## 2020-02-21 LAB — HEMOGLOBIN A1C
Est. average glucose Bld gHb Est-mCnc: 126 mg/dL
Hgb A1c MFr Bld: 6 % — ABNORMAL HIGH (ref 4.8–5.6)

## 2020-02-22 NOTE — Patient Instructions (Signed)
Visit Information  Goals Addressed            This Visit's Progress   . Diabetes Management       CARE PLAN ENTRY  Current Barriers:  . Diabetes: Controlled Lab Results  Component Value Date   HGBA1C 6.4 (H) 11/14/2019 .   Lab Results  Component Value Date   CREATININE 1.95 (H) 12/19/2019   CREATININE 2.14 (H) 12/07/2019   CREATININE 1.78 (H) 11/07/2019 .   Marland Kitchen No results found for: EGFR . Current antihyperglycemic regimen: Tresiba 22 units in the evening . Denies hypoglycemic symptoms, including dizziness, lightheadedness, shaking, sweating . Denies hyperglycemic symptoms, including polyuria, polydipsia, polyphagia, nocturia, blurred vision, neuropathy . Current meal patterns: o Breakfast and dinner only . Current exercise: Did not assess . Current blood glucose readings:  o AM: 119, 136, 121, 131, 94, 148, 116, 115 o PM (before dinner): 141, 136, 124, 112, 143, 128, 114, 156, 151  Pharmacist Clinical Goal(s):  Marland Kitchen Over the next 30 days, patient will work with PharmD and primary care provider to address diabetes management after stopping treatment with Ozempic  Interventions: . Advised patient to continue checking blood sugar twice daily  . Will follow up in 1 month  Patient Self Care Activities:  . Patient will check blood glucose twice daily, document, and provide at future appointments . Patient will take medications as prescribed . Patient will contact provider with any episodes of hypoglycemia . Patient will report any questions or concerns to provider   Please see past updates related to this goal by clicking on the "Past Updates" button in the selected goal         Patient verbalizes understanding of instructions provided today.   Telephone follow up appointment with pharmacy team member scheduled for: 03/06/20 @ 3:30PM  Jannette Fogo, PharmD Clinical Pharmacist [facility] [phone]

## 2020-02-22 NOTE — Chronic Care Management (AMB) (Signed)
  Chronic Care Management   Note   Name: Alec Solis MRN: [id number] DOB: 24-Jun-1933  PharmD contacted patient to determine most recent blood glucose readings.  Diabetes   Recent Relevant Labs: Lab Results  Component Value Date/Time   HGBA1C 6.0 (H) 02/20/2020 11:42 AM   HGBA1C 6.4 (H) 11/14/2019 04:02 PM   MICROALBUR 80 02/20/2020 11:46 AM   MICROALBUR 30 01/17/2019 12:39 PM    Checking BG: 2x per Day  Recent FBG Readings: 113, 126, 103, 122, 107, 135 Recent pre-meal BG readings: 131, 125, 165, 140, 119, 125 Recent 2hr PP BG readings:  Recent HS BG readings:  Patient has failed these meds in past: Ozempic, Farxiga, Lantus, Tradjenta, [place] Patient is currently controlled on the following medications:  -Tresiba 22 units in the evening  We discussed: -Pt checking BG twice daily -Instructed pt to take evening BG before dinner -Last dose of Ozempic was 12/28/19(discontinued due to significant nausea and vomiting) -Pt denies episodes of hypoglycemia -Current BG readings are primarily within goal  Plan -Continue current medications  -Will not start Novolog at this time. Will assess patient's BG readings in 10 days to determine need for mealtime insulin   Follow up plan: Contact patient on 6/11 to assess BG readings  Jannette Fogo, PharmD Clinical Pharmacist [facility] [phone]

## 2020-02-22 NOTE — Chronic Care Management (AMB) (Signed)
  Chronic Care Management   Note   Name: Alec Solis MRN: [id number] DOB: [birth year]  PharmD contacted patient to determine most recent blood glucose readings.  Diabetes   Recent Relevant Labs: Lab Results  Component Value Date/Time   HGBA1C 6.0 (H) 02/20/2020 11:42 AM   HGBA1C 6.4 (H) 11/14/2019 04:02 PM   MICROALBUR 80 02/20/2020 11:46 AM   MICROALBUR 30 01/17/2019 12:39 PM    Checking BG: 2x per Day  Recent FBG Readings: 119, 136, 121, 131, 94, 148, 116, 115 Recent pre-meal BG readings: 141, 136, 124, 112, 143, 128, 114, 156, 151 Recent 2hr PP BG readings:  Recent HS BG readings:  Patient has failed these meds in past: Ozempic, Farxiga, Lantus, Tradjenta, [place] Patient is currently controlled on the following medications:  -Tresiba 22 units in the evening  We discussed: -Pt checking BG twice daily -Instructed pt to take evening BG before dinner -Last dose of Ozempic was 12/28/19(discontinued due to significant nausea and vomiting) -Pt denies episodes of hypoglycemia -Current BG readings are primarily within goal -Regarding reading of 156 and 151, pt states that he had carbohydrates and soda  Plan -Continue current medications  -Will not start Novolog at this time. Will assess patient's BG readings in 1 month to determine need for mealtime insulin   Follow up plan: Contact patient on 7/14 to assess BG readings  Jannette Fogo, PharmD Clinical Pharmacist [facility] [phone]

## 2020-02-23 ENCOUNTER — Other Ambulatory Visit (HOSPITAL_COMMUNITY): Payer: Self-pay | Admitting: *Deleted

## 2020-02-26 ENCOUNTER — Telehealth: Payer: Self-pay

## 2020-02-26 NOTE — Telephone Encounter (Signed)
-----   Message from [name], MD sent at 02/24/2020  4:17 PM EDT ----- Your hba1c is 6.0, this is great! I think we may need to decrease your insulin. What have your sugars been running in the mornings?

## 2020-02-26 NOTE — Telephone Encounter (Signed)
Left vm for pt to return call for results

## 2020-02-27 ENCOUNTER — Encounter (HOSPITAL_COMMUNITY)
Admission: RE | Admit: 2020-02-27 | Discharge: 2020-02-27 | Disposition: A | Discharge status: Home / Self Care | Payer: Medicare Other | Source: Ambulatory Visit | Attending: Nephrology | Admitting: Nephrology

## 2020-02-27 ENCOUNTER — Other Ambulatory Visit: Payer: Self-pay

## 2020-02-27 ENCOUNTER — Encounter (HOSPITAL_COMMUNITY): Payer: Medicare Other

## 2020-02-27 VITALS — BP 120/78 | HR 66 | Temp 97.5°F | Resp 20

## 2020-02-27 DIAGNOSIS — N289 Disorder of kidney and ureter, unspecified: Secondary | ICD-10-CM | POA: Insufficient documentation

## 2020-02-27 MED ORDER — EPOETIN ALFA 10000 UNIT/ML IJ SOLN
INTRAMUSCULAR | Status: AC
  Administered 2020-02-27: 10000 [IU]
  Filled 2020-02-27: qty 1

## 2020-02-27 MED ORDER — EPOETIN ALFA 20000 UNIT/ML IJ SOLN
INTRAMUSCULAR | Status: AC
  Administered 2020-02-27: 20000 [IU]
  Filled 2020-02-27: qty 1

## 2020-02-27 MED ORDER — EPOETIN ALFA 40000 UNIT/ML IJ SOLN: 30000.0000 [IU] | INTRAMUSCULAR | Status: DC

## 2020-02-27 MED ORDER — SODIUM CHLORIDE 0.9 % IV SOLN
510.0000 mg | INTRAVENOUS | Status: DC
  Administered 2020-02-27: 510 mg via INTRAVENOUS
  Filled 2020-02-27: qty 17

## 2020-02-28 LAB — POCT HEMOGLOBIN-HEMACUE: Hemoglobin: 8.4 g/dL — ABNORMAL LOW (ref 13.0–17.0)

## 2020-03-04 ENCOUNTER — Telehealth: Payer: Self-pay | Admitting: Internal Medicine

## 2020-03-04 ENCOUNTER — Telehealth: Payer: Self-pay

## 2020-03-04 ENCOUNTER — Other Ambulatory Visit: Payer: Self-pay | Admitting: Internal Medicine

## 2020-03-04 NOTE — Chronic Care Management (AMB) (Signed)
  Chronic Care Management   Note  03/04/2020 Name: Mohid Furuya MRN: [id number] DOB: 09/24/1932  Rommel Hogston is a 86 y.o. year old male who is a primary care patient of [name], MD and is actively engaged with the care management team. I reached out to [name] by phone today to assist with re-scheduling a follow up visit with the Pharmacist.  Follow up plan: Telephone appointment with care management team member scheduled for:03/19/2020.  [place], [place] [address] Direct Dial: [phone] [email or web address]: [email or web address]

## 2020-03-06 DIAGNOSIS — K921 Melena: Secondary | ICD-10-CM | POA: Insufficient documentation

## 2020-03-06 DIAGNOSIS — K219 Gastro-esophageal reflux disease without esophagitis: Secondary | ICD-10-CM | POA: Insufficient documentation

## 2020-03-06 NOTE — Progress Notes (Signed)
This visit occurred during the SARS-CoV-2 public health emergency.  Safety protocols were in place, including screening questions prior to the visit, additional usage of staff PPE, and extensive cleaning of exam room while observing appropriate contact time as indicated for disinfecting solutions.  Subjective:     Patient ID: Alec Solis , male    DOB: 09/10/1932 , 86 y.o.   MRN: [id number]   Chief Complaint  Patient presents with  . Diabetes    HPI  He presents today for diabetes f/u. He has finally stopped Ozempic. His sugars are still "fine". He has noticed less abdominal discomfort. His reflux has persisted.   Diabetes He presents for his follow-up diabetic visit. He has type 2 diabetes mellitus. There are no hypoglycemic complications. There are no diabetic complications. He participates in exercise intermittently. Eye exam is current.     Past Medical History:  Diagnosis Date  . Anemia   . Anxiety   . Arthritis   . Cataract   . Chronic kidney disease   . Depression   . Diabetes mellitus without complication ([place])    type II   . GERD (gastroesophageal reflux disease)   . Gout   . Heart murmur   . Hypertension   . Myocardial infarction ([place])    minor Mi- years ago -   . Varicose veins of left lower extremity      Family History  Problem Relation Age of Onset  . Breast cancer Mother   . Stomach cancer Mother   . Diabetes Mellitus II Maternal Aunt      Current Outpatient Medications:  .  acetaminophen (ACETAMINOPHEN 8 HOUR) 650 MG CR tablet, Take 650 mg by mouth daily., Disp: , Rfl:  .  allopurinol (ZYLOPRIM) 100 MG tablet, TAKE 1 TABLET BY MOUTH TWICE DAILY, Disp: 180 tablet, Rfl: 1 .  amLODipine (NORVASC) 5 MG tablet, TAKE 1 TABLET BY MOUTH DAILY, Disp: 90 tablet, Rfl: 0 .  aspirin 81 MG tablet, Take 81 mg by mouth daily., Disp: , Rfl:  .  B-D ULTRAFINE III SHORT PEN 31G X 8 MM MISC, USE AS DIRECTED FOUR TIMES DAILY, Disp: 100 each, Rfl: 3 .  calcitRIOL  (ROCALTROL) 0.25 MCG capsule, Take 0.25 mcg by mouth See admin instructions. Monday Thursday Sunday,  Pt takes every 3rd day, Disp: , Rfl:  .  carboxymethylcellulose (REFRESH PLUS) 0.5 % SOLN, 1 drop 3 (three) times daily as needed. As needed, Disp: , Rfl:  .  carvedilol (COREG) 12.5 MG tablet, Take 12.5 mg by mouth 2 (two) times daily with a meal., Disp: , Rfl:  .  ciclopirox (LOPROX) 0.77 % cream, APPLY TOPICALLY TO BOTH FEET AND BETWEEN TOES TWICE DAILY FOR 4 WEEKS, Disp: 30 g, Rfl: 1 .  clopidogrel (PLAVIX) 75 MG tablet, TAKE 1 TABLET(75 MG) BY MOUTH DAILY, Disp: 90 tablet, Rfl: 2 .  dexlansoprazole (DEXILANT) 60 MG capsule, Take 60 mg by mouth daily., Disp: , Rfl:  .  ferrous sulfate (QC FERROUS SULFATE) 325 (65 FE) MG tablet, Take 325 mg by mouth daily with breakfast., Disp: , Rfl:  .  furosemide (LASIX) 40 MG tablet, Take 40 mg by mouth daily., Disp: , Rfl:  .  glucose blood (TRUE METRIX BLOOD GLUCOSE TEST) test strip, Use as directed to check blood sugars 3 times per day dx: e11.65, Disp: 300 each, Rfl: 5 .  insulin degludec (TRESIBA FLEXTOUCH) 200 UNIT/ML FlexTouch Pen, Inject 22 Units into the skin daily., Disp: 9 pen, Rfl: 0 .  Insulin Pen Needle 32G X 8 MM MISC, Use as directed, Disp: 100 each, Rfl: 2 .  Multiple Vitamins-Minerals (MULTIVITAMIN ADULT PO), Take by mouth daily., Disp: , Rfl:  .  Omega-3 Fatty Acids (FISH OIL OMEGA-3 PO), Take 750 mg by mouth daily., Disp: , Rfl:  .  rosuvastatin (CRESTOR) 20 MG tablet, TAKE 1 TABLET BY MOUTH MONDAY-FRIDAY, Disp: 75 tablet, Rfl: 1 .  sodium bicarbonate 650 MG tablet, Take 1,300 mg by mouth 2 (two) times daily., Disp: , Rfl:  .  TRUEplus Lancets 30G MISC, USE AS DIRECTED TO TEST BLOOD SUGAR THREE TIMES DAILY, Disp: 300 each, Rfl: 11 .  omeprazole (PRILOSEC) 40 MG capsule, TAKE 1 CAPSULE BY MOUTH EVERY DAY BEFORE A MEAL (Patient not taking: Reported on 02/20/2020), Disp: 90 capsule, Rfl: 1 .  ondansetron (ZOFRAN) 4 MG tablet, Take 1 tablet (4  mg total) by mouth daily as needed for nausea or vomiting. (Patient not taking: Reported on 02/20/2020), Disp: 30 tablet, Rfl: 1 .  saw palmetto 500 MG capsule, Take by mouth daily. (Patient not taking: Reported on 02/20/2020), Disp: , Rfl:    No Known Allergies   Review of Systems  Constitutional: Negative.   Respiratory: Negative.   Cardiovascular: Negative.   Gastrointestinal:       He c/o black stools. He denies diarrhea. Not sure what may have triggered his sx. Also still with reflux sx. Denies BRBPR.  Neurological: Negative.   Psychiatric/Behavioral: Negative.      Today's Vitals   02/20/20 1103  BP: 122/62  Pulse: 73  Temp: 98.9 F (37.2 C)  TempSrc: Oral  Weight: 191 lb 9.6 oz (86.9 kg)  Height: 6' (1.829 m)  PainSc: 0-No pain   Body mass index is 25.99 kg/m.   Objective:  Physical Exam Vitals and nursing note reviewed.  Constitutional:      Appearance: Normal appearance.  Cardiovascular:     Rate and Rhythm: Normal rate and regular rhythm.     Heart sounds: Normal heart sounds.  Pulmonary:     Effort: Pulmonary effort is normal.     Breath sounds: Normal breath sounds.  Genitourinary:    Comments: He declined DRE.  Skin:    General: Skin is warm.  Neurological:     General: No focal deficit present.     Mental Status: He is alert.  Psychiatric:        Mood and Affect: Mood normal.         Assessment And Plan:     Problem List Items Addressed This Visit      Digestive   Gastroesophageal reflux disease without esophagitis   Relevant Medications   dexlansoprazole (DEXILANT) 60 MG capsule   Other Relevant Orders   Ambulatory referral to Gastroenterology     Endocrine   Diabetes mellitus with stage 3 chronic kidney disease ([place]) - Primary   Relevant Orders   Hemoglobin A1c (Completed)   POCT UA - Microalbumin (Completed)     Other   Black stools   Relevant Orders   Ambulatory referral to Gastroenterology        Patient was given  opportunity to ask questions. Patient verbalized understanding of the plan and was able to repeat key elements of the plan. All questions were answered to their satisfaction.   Maximino Greenland, MD   I, Maximino Greenland, MD, have reviewed all documentation for this visit. The documentation on 03/06/20 for the exam, diagnosis, procedures, and orders are all accurate and  complete.  THE PATIENT IS ENCOURAGED TO PRACTICE SOCIAL DISTANCING DUE TO THE COVID-19 PANDEMIC.

## 2020-03-11 DIAGNOSIS — R1013 Epigastric pain: Secondary | ICD-10-CM | POA: Diagnosis not present

## 2020-03-11 DIAGNOSIS — R195 Other fecal abnormalities: Secondary | ICD-10-CM | POA: Diagnosis not present

## 2020-03-11 DIAGNOSIS — D509 Iron deficiency anemia, unspecified: Secondary | ICD-10-CM | POA: Diagnosis not present

## 2020-03-12 ENCOUNTER — Ambulatory Visit: Payer: Medicare Other | Admitting: Podiatry

## 2020-03-12 ENCOUNTER — Ambulatory Visit (HOSPITAL_COMMUNITY)
Admission: RE | Admit: 2020-03-12 | Discharge: 2020-03-12 | Disposition: A | Discharge status: Home / Self Care | Payer: Medicare Other | Source: Ambulatory Visit | Attending: Nephrology | Admitting: Nephrology

## 2020-03-12 ENCOUNTER — Other Ambulatory Visit: Payer: Self-pay

## 2020-03-12 VITALS — BP 113/58 | HR 68 | Temp 96.8°F | Resp 20

## 2020-03-12 DIAGNOSIS — N289 Disorder of kidney and ureter, unspecified: Secondary | ICD-10-CM | POA: Diagnosis not present

## 2020-03-12 LAB — POCT HEMOGLOBIN-HEMACUE: Hemoglobin: 8.6 g/dL — ABNORMAL LOW (ref 13.0–17.0)

## 2020-03-12 MED ORDER — EPOETIN ALFA 40000 UNIT/ML IJ SOLN: 30000.0000 [IU] | INTRAMUSCULAR | Status: DC

## 2020-03-12 MED ORDER — EPOETIN ALFA 20000 UNIT/ML IJ SOLN
INTRAMUSCULAR | Status: AC
  Administered 2020-03-12: 20000 [IU] via SUBCUTANEOUS
  Filled 2020-03-12: qty 1

## 2020-03-12 MED ORDER — EPOETIN ALFA 10000 UNIT/ML IJ SOLN
INTRAMUSCULAR | Status: AC
  Administered 2020-03-12: 10000 [IU] via SUBCUTANEOUS
  Filled 2020-03-12: qty 1

## 2020-03-12 MED ORDER — SODIUM CHLORIDE 0.9 % IV SOLN
510.0000 mg | INTRAVENOUS | Status: AC
  Administered 2020-03-12: 510 mg via INTRAVENOUS
  Filled 2020-03-12: qty 17

## 2020-03-18 ENCOUNTER — Telehealth: Payer: Self-pay

## 2020-03-18 NOTE — Telephone Encounter (Signed)
Mrs. Alec Solis was notified that a sample of [name] is ready for pickup for the pt because he waiting on his shipment from the pt assistance.

## 2020-03-19 ENCOUNTER — Other Ambulatory Visit: Payer: Self-pay

## 2020-03-19 ENCOUNTER — Ambulatory Visit (INDEPENDENT_AMBULATORY_CARE_PROVIDER_SITE_OTHER): Payer: Medicare Other

## 2020-03-19 DIAGNOSIS — N183 Chronic kidney disease, stage 3 unspecified: Secondary | ICD-10-CM

## 2020-03-19 DIAGNOSIS — E1122 Type 2 diabetes mellitus with diabetic chronic kidney disease: Secondary | ICD-10-CM | POA: Diagnosis not present

## 2020-03-19 DIAGNOSIS — I10 Essential (primary) hypertension: Secondary | ICD-10-CM | POA: Diagnosis not present

## 2020-03-22 DIAGNOSIS — H353131 Nonexudative age-related macular degeneration, bilateral, early dry stage: Secondary | ICD-10-CM | POA: Diagnosis not present

## 2020-03-22 DIAGNOSIS — H35033 Hypertensive retinopathy, bilateral: Secondary | ICD-10-CM | POA: Diagnosis not present

## 2020-03-22 DIAGNOSIS — E119 Type 2 diabetes mellitus without complications: Secondary | ICD-10-CM | POA: Diagnosis not present

## 2020-03-22 DIAGNOSIS — Z961 Presence of intraocular lens: Secondary | ICD-10-CM | POA: Diagnosis not present

## 2020-03-22 LAB — HM DIABETES EYE EXAM

## 2020-03-25 ENCOUNTER — Telehealth: Payer: Self-pay

## 2020-03-25 NOTE — Chronic Care Management (AMB) (Signed)
Chronic Care Management Pharmacy Assistant   Name: Bernhard Koskinen  MRN: [id number] DOB: 1933/04/14  Reason for Encounter: Medication Review  PCP : [name], MD  Allergies:  No Known Allergies  Medications: Outpatient Encounter Medications as of 03/25/2020  Medication Sig Note  . acetaminophen (ACETAMINOPHEN 8 HOUR) 650 MG CR tablet Take 650 mg by mouth daily.   Marland Kitchen allopurinol (ZYLOPRIM) 100 MG tablet TAKE 1 TABLET BY MOUTH TWICE DAILY   . amLODipine (NORVASC) 5 MG tablet TAKE 1 TABLET BY MOUTH DAILY   . aspirin 81 MG tablet Take 81 mg by mouth daily.   . B-D ULTRAFINE III SHORT PEN 31G X 8 MM MISC USE AS DIRECTED FOUR TIMES DAILY   . calcitRIOL (ROCALTROL) 0.25 MCG capsule Take 0.25 mcg by mouth See admin instructions. Monday Thursday Sunday,  Pt takes every 3rd day   . carboxymethylcellulose (REFRESH PLUS) 0.5 % SOLN 1 drop 3 (three) times daily as needed. As needed   . carvedilol (COREG) 12.5 MG tablet Take 12.5 mg by mouth 2 (two) times daily with a meal.   . ciclopirox (LOPROX) 0.77 % cream APPLY TOPICALLY TO BOTH FEET AND BETWEEN TOES TWICE DAILY FOR 4 WEEKS   . clopidogrel (PLAVIX) 75 MG tablet TAKE 1 TABLET(75 MG) BY MOUTH DAILY   . dexlansoprazole (DEXILANT) 60 MG capsule Take 60 mg by mouth daily.   . ferrous sulfate (QC FERROUS SULFATE) 325 (65 FE) MG tablet Take 325 mg by mouth daily with breakfast.   . furosemide (LASIX) 40 MG tablet Take 40 mg by mouth daily. 08/02/2019: Uses 3x per week  . glucose blood (TRUE METRIX BLOOD GLUCOSE TEST) test strip Use as directed to check blood sugars 3 times per day dx: e11.65   . insulin degludec (TRESIBA FLEXTOUCH) 200 UNIT/ML FlexTouch Pen Inject 22 Units into the skin daily.   . Insulin Pen Needle 32G X 8 MM MISC Use as directed   . Multiple Vitamins-Minerals (MULTIVITAMIN ADULT PO) Take by mouth daily. 08/02/2019: Marca AnconaCelesta Gentile Omega Men  . Omega-3 Fatty Acids (FISH OIL OMEGA-3 PO) Take 750 mg by mouth daily. 08/02/2019: Taking OTC    . omeprazole (PRILOSEC) 40 MG capsule TAKE 1 CAPSULE BY MOUTH EVERY DAY BEFORE A MEAL (Patient not taking: Reported on 02/20/2020)   . ondansetron (ZOFRAN) 4 MG tablet Take 1 tablet (4 mg total) by mouth daily as needed for nausea or vomiting. (Patient not taking: Reported on 02/20/2020)   . rosuvastatin (CRESTOR) 20 MG tablet TAKE 1 TABLET BY MOUTH MONDAY-FRIDAY   . saw palmetto 500 MG capsule Take by mouth daily. (Patient not taking: Reported on 02/20/2020) 08/02/2019: Pt unaware of dose  . sodium bicarbonate 650 MG tablet Take 1,300 mg by mouth 2 (two) times daily.   . TRUEplus Lancets 30G MISC USE AS DIRECTED TO TEST BLOOD SUGAR THREE TIMES DAILY    No facility-administered encounter medications on file as of 03/25/2020.    Current Diagnosis: Patient Active Problem List   Diagnosis Date Noted  . Black stools 03/06/2020  . Gastroesophageal reflux disease without esophagitis 03/06/2020  . Paget's disease of bone 10/23/2018  . Acute renal failure (ARF) ([place]) 06/11/2016  . Hyperglycemia 06/11/2016  . Deficiency anemia 12/09/2011  . Diabetes mellitus with stage 3 chronic kidney disease ([place]) 12/09/2011  . Renal insufficiency 12/09/2011     Follow-Up:  Patient Assistance Coordination- Refill/Reorder form started for patient's Tresiba and Ozempic. After reviewing chart, Ozempic was d/c due to significant nausea  and vomiting per Jannette Fogo, CPP during 02/02/2020 visit, Last dose of Ozempic 12/28/2019 but patient is to continue on Tresiba Flextouch injecting 22 units daily. Printed form for  Dr Baird Cancer to sign. [place], [place] Pharmacist Assistant [phone]  03/27/2020- Form Faxed to [employer] for patient assistance on [place]. Pattricia Boss, [place] Pharmacist Assistant [phone]

## 2020-03-26 ENCOUNTER — Other Ambulatory Visit: Payer: Self-pay

## 2020-03-26 ENCOUNTER — Encounter (HOSPITAL_COMMUNITY)
Admission: RE | Admit: 2020-03-26 | Discharge: 2020-03-26 | Disposition: A | Discharge status: Home / Self Care | Payer: Medicare Other | Source: Ambulatory Visit | Attending: Nephrology | Admitting: Nephrology

## 2020-03-26 ENCOUNTER — Other Ambulatory Visit: Payer: Self-pay | Admitting: Gastroenterology

## 2020-03-26 ENCOUNTER — Encounter: Payer: Self-pay | Admitting: Internal Medicine

## 2020-03-26 VITALS — BP 120/74 | HR 72 | Temp 97.7°F | Resp 20

## 2020-03-26 DIAGNOSIS — D631 Anemia in chronic kidney disease: Secondary | ICD-10-CM | POA: Diagnosis not present

## 2020-03-26 DIAGNOSIS — N289 Disorder of kidney and ureter, unspecified: Secondary | ICD-10-CM

## 2020-03-26 DIAGNOSIS — N183 Chronic kidney disease, stage 3 unspecified: Secondary | ICD-10-CM | POA: Insufficient documentation

## 2020-03-26 LAB — RENAL FUNCTION PANEL
Albumin: 3.7 g/dL (ref 3.5–5.0)
Anion gap: 8 (ref 5–15)
BUN: 24 mg/dL — ABNORMAL HIGH (ref 8–23)
CO2: 24 mmol/L (ref 22–32)
Calcium: 9.6 mg/dL (ref 8.9–10.3)
Chloride: 107 mmol/L (ref 98–111)
Creatinine, Ser: 1.77 mg/dL — ABNORMAL HIGH (ref 0.61–1.24)
GFR calc Af Amer: 40 mL/min — ABNORMAL LOW (ref >60)
GFR calc non Af Amer: 35 mL/min — ABNORMAL LOW (ref >60)
Glucose, Bld: 143 mg/dL — ABNORMAL HIGH (ref 70–99)
Phosphorus: 2.8 mg/dL (ref 2.5–4.6)
Potassium: 4.3 mmol/L (ref 3.5–5.1)
Sodium: 139 mmol/L (ref 135–145)

## 2020-03-26 LAB — FERRITIN: Ferritin: 1103 ng/mL — ABNORMAL HIGH (ref 24–336)

## 2020-03-26 LAB — IRON AND TIBC
Iron: 59 ug/dL (ref 45–182)
Saturation Ratios: 18 % (ref 17.9–39.5)
TIBC: 319 ug/dL (ref 250–450)
UIBC: 260 ug/dL

## 2020-03-26 LAB — POCT HEMOGLOBIN-HEMACUE: Hemoglobin: 9 g/dL — ABNORMAL LOW (ref 13.0–17.0)

## 2020-03-26 MED ORDER — EPOETIN ALFA 20000 UNIT/ML IJ SOLN
INTRAMUSCULAR | Status: AC
  Administered 2020-03-26: 20000 [IU]
  Filled 2020-03-26: qty 1

## 2020-03-26 MED ORDER — EPOETIN ALFA 40000 UNIT/ML IJ SOLN: 30000.0000 [IU] | INTRAMUSCULAR | Status: DC

## 2020-03-26 MED ORDER — EPOETIN ALFA 10000 UNIT/ML IJ SOLN
INTRAMUSCULAR | Status: AC
  Administered 2020-03-26: 10000 [IU]
  Filled 2020-03-26: qty 1

## 2020-03-27 ENCOUNTER — Other Ambulatory Visit (HOSPITAL_COMMUNITY)
Admission: RE | Admit: 2020-03-27 | Discharge: 2020-03-27 | Disposition: A | Discharge status: Home / Self Care | Payer: Medicare Other | Source: Ambulatory Visit | Attending: Gastroenterology | Admitting: Gastroenterology

## 2020-03-27 DIAGNOSIS — Z01812 Encounter for preprocedural laboratory examination: Secondary | ICD-10-CM | POA: Insufficient documentation

## 2020-03-27 DIAGNOSIS — Z20822 Contact with and (suspected) exposure to covid-19: Secondary | ICD-10-CM | POA: Diagnosis not present

## 2020-03-27 LAB — SARS CORONAVIRUS 2 (TAT 6-24 HRS): SARS Coronavirus 2: NEGATIVE

## 2020-03-27 LAB — PTH, INTACT AND CALCIUM
Calcium, Total (PTH): 9.8 mg/dL (ref 8.6–10.2)
PTH: 59 pg/mL (ref 15–65)

## 2020-03-29 ENCOUNTER — Encounter (HOSPITAL_COMMUNITY): Payer: Self-pay | Admitting: Gastroenterology

## 2020-03-29 ENCOUNTER — Ambulatory Visit (HOSPITAL_COMMUNITY): Payer: Medicare Other | Admitting: Certified Registered Nurse Anesthetist

## 2020-03-29 ENCOUNTER — Encounter (HOSPITAL_COMMUNITY)
Admission: RE | Disposition: A | Discharge status: Home / Self Care | Payer: Self-pay | Source: Home / Self Care | Attending: Gastroenterology

## 2020-03-29 ENCOUNTER — Other Ambulatory Visit: Payer: Self-pay

## 2020-03-29 ENCOUNTER — Ambulatory Visit (HOSPITAL_COMMUNITY)
Admission: RE | Admit: 2020-03-29 | Discharge: 2020-03-29 | Disposition: A | Discharge status: Home / Self Care | Payer: Medicare Other | Attending: Gastroenterology | Admitting: Gastroenterology

## 2020-03-29 DIAGNOSIS — E1122 Type 2 diabetes mellitus with diabetic chronic kidney disease: Secondary | ICD-10-CM | POA: Insufficient documentation

## 2020-03-29 DIAGNOSIS — D123 Benign neoplasm of transverse colon: Secondary | ICD-10-CM | POA: Diagnosis not present

## 2020-03-29 DIAGNOSIS — K5730 Diverticulosis of large intestine without perforation or abscess without bleeding: Secondary | ICD-10-CM | POA: Insufficient documentation

## 2020-03-29 DIAGNOSIS — D1779 Benign lipomatous neoplasm of other sites: Secondary | ICD-10-CM | POA: Insufficient documentation

## 2020-03-29 DIAGNOSIS — K5521 Angiodysplasia of colon with hemorrhage: Secondary | ICD-10-CM | POA: Insufficient documentation

## 2020-03-29 DIAGNOSIS — D509 Iron deficiency anemia, unspecified: Secondary | ICD-10-CM | POA: Diagnosis not present

## 2020-03-29 DIAGNOSIS — K635 Polyp of colon: Secondary | ICD-10-CM | POA: Diagnosis not present

## 2020-03-29 DIAGNOSIS — N189 Chronic kidney disease, unspecified: Secondary | ICD-10-CM | POA: Diagnosis not present

## 2020-03-29 DIAGNOSIS — M1990 Unspecified osteoarthritis, unspecified site: Secondary | ICD-10-CM | POA: Insufficient documentation

## 2020-03-29 DIAGNOSIS — Z87891 Personal history of nicotine dependence: Secondary | ICD-10-CM | POA: Diagnosis not present

## 2020-03-29 DIAGNOSIS — I252 Old myocardial infarction: Secondary | ICD-10-CM | POA: Insufficient documentation

## 2020-03-29 DIAGNOSIS — D122 Benign neoplasm of ascending colon: Secondary | ICD-10-CM | POA: Diagnosis not present

## 2020-03-29 DIAGNOSIS — E1136 Type 2 diabetes mellitus with diabetic cataract: Secondary | ICD-10-CM | POA: Insufficient documentation

## 2020-03-29 DIAGNOSIS — I129 Hypertensive chronic kidney disease with stage 1 through stage 4 chronic kidney disease, or unspecified chronic kidney disease: Secondary | ICD-10-CM | POA: Insufficient documentation

## 2020-03-29 DIAGNOSIS — D175 Benign lipomatous neoplasm of intra-abdominal organs: Secondary | ICD-10-CM | POA: Diagnosis not present

## 2020-03-29 DIAGNOSIS — Z833 Family history of diabetes mellitus: Secondary | ICD-10-CM | POA: Insufficient documentation

## 2020-03-29 HISTORY — PX: HEMOSTASIS CLIP PLACEMENT: SHX6857

## 2020-03-29 HISTORY — PX: ENTEROSCOPY: SHX5533

## 2020-03-29 HISTORY — PX: HOT HEMOSTASIS: SHX5433

## 2020-03-29 HISTORY — PX: POLYPECTOMY: SHX5525

## 2020-03-29 HISTORY — PX: COLONOSCOPY WITH PROPOFOL: SHX5780

## 2020-03-29 LAB — GLUCOSE, CAPILLARY
Glucose-Capillary: 70 mg/dL (ref 70–99)
Glucose-Capillary: 69 mg/dL — ABNORMAL LOW (ref 70–99)
Glucose-Capillary: 76 mg/dL (ref 70–99)

## 2020-03-29 SURGERY — COLONOSCOPY WITH PROPOFOL
Anesthesia: Monitor Anesthesia Care

## 2020-03-29 MED ORDER — LIDOCAINE HCL (CARDIAC) PF 100 MG/5ML IV SOSY
PREFILLED_SYRINGE | INTRAVENOUS | Status: DC | PRN
  Administered 2020-03-29: 100 mg via INTRAVENOUS

## 2020-03-29 MED ORDER — LACTATED RINGERS IV SOLN
INTRAVENOUS | Status: AC | PRN
  Administered 2020-03-29: 10 mL/h via INTRAVENOUS

## 2020-03-29 MED ORDER — PROPOFOL 500 MG/50ML IV EMUL
INTRAVENOUS | Status: DC | PRN
  Administered 2020-03-29: 125 ug/kg/min via INTRAVENOUS

## 2020-03-29 MED ORDER — PROPOFOL 10 MG/ML IV BOLUS
INTRAVENOUS | Status: DC | PRN
  Administered 2020-03-29: 40 mg via INTRAVENOUS

## 2020-03-29 MED ORDER — SODIUM CHLORIDE 0.9 % IV SOLN: INTRAVENOUS | Status: DC

## 2020-03-29 SURGICAL SUPPLY — 24 items

## 2020-03-29 NOTE — Op Note (Addendum)
[facility] Patient Name: Alec Solis Procedure Date: 03/29/2020 MRN: [id number] Attending MD: Carol Ada , MD Date of Birth: 08-Mar-1933 CSN: [id number] Age: 86 Admit Type: Outpatient Procedure:                Colonoscopy Indications:              Iron deficiency anemia Providers:                Carol Ada, MD, Elmer Ramp. Tilden Dome, RN, Theodora Blow,                            Technician, Faustina Mbumina, Technician Referring MD:              Medicines:                Propofol per Anesthesia Complications:            No immediate complications. Estimated Blood Loss:     Estimated blood loss was minimal. Procedure:                Pre-Anesthesia Assessment:                           - Prior to the procedure, a History and Physical                            was performed, and patient medications and                            allergies were reviewed. The patient's tolerance of                            previous anesthesia was also reviewed. The risks                            and benefits of the procedure and the sedation                            options and risks were discussed with the patient.                            All questions were answered, and informed consent                            was obtained. Prior Anticoagulants: The patient has                            taken Plavix (clopidogrel), last dose was 1 day                            prior to procedure. ASA Grade Assessment: III - A                            patient with severe systemic disease. After  reviewing the risks and benefits, the patient was                            deemed in satisfactory condition to undergo the                            procedure.                           - Sedation was administered by an anesthesia                            professional. Deep sedation was attained.                           After obtaining informed consent, the colonoscope                             was passed under direct vision. Throughout the                            procedure, the patient's blood pressure, pulse, and                            oxygen saturations were monitored continuously. The                            PCF-H190DL ([id number] Olympus pediatric colonscope                            was introduced through the anus and advanced to the                            the cecum, identified by appendiceal orifice and                            ileocecal valve. The colonoscopy was performed                            without difficulty. The patient tolerated the                            procedure well. The quality of the bowel                            preparation was good. The ileocecal valve,                            appendiceal orifice, and rectum were photographed. Scope In: 10:43:30 AM Scope Out: 86:76:72 AM Scope Withdrawal Time: 0 hours 12 minutes 56 seconds  Total Procedure Duration: 0 hours 22 minutes 45 seconds  Findings:      Six sessile polyps were found in the transverse colon and ascending       colon. The polyps were 3 to 4 mm in size. These polyps  were removed with       a cold snare. Resection and retrieval were complete.      There was a small lipoma, in the ascending colon.      Scattered small and large-mouthed diverticula were found in the sigmoid       colon and descending colon. Impression:               - Six 3 to 4 mm polyps in the transverse colon and                            in the ascending colon, removed with a cold snare.                            Resected and retrieved.                           - Small lipoma in the ascending colon.                           - Diverticulosis in the sigmoid colon and in the                            descending colon. Moderate Sedation:      Not Applicable - Patient had care per Anesthesia. Recommendation:           - Patient has a contact number available for                             emergencies. The signs and symptoms of potential                            delayed complications were discussed with the                            patient. Return to normal activities tomorrow.                            Written discharge instructions were provided to the                            patient.                           - Resume previous diet.                           - Continue present medications.                           - Await pathology results.                           - No repeat colonoscopy is recommended with his age                            and comorbidities. Procedure Code(s):        ---  Professional ---                           [id number], Colonoscopy, flexible; with removal of                            tumor(s), polyp(s), or other lesion(s) by snare                            technique Diagnosis Code(s):        --- Professional ---                           K63.5, Polyp of colon                           D17.5, Benign lipomatous neoplasm of                            intra-abdominal organs                           D50.9, Iron deficiency anemia, unspecified                           K57.30, Diverticulosis of large intestine without                            perforation or abscess without bleeding CPT copyright 2019 [employer]. All rights reserved. The codes documented in this report are preliminary and upon coder review may  be revised to meet current compliance requirements. Carol Ada, MD Carol Ada, MD 03/29/2020 11:23:28 AM This report has been signed electronically. Number of Addenda: 0

## 2020-03-29 NOTE — Op Note (Addendum)
[facility] Patient Name: Alec Solis Procedure Date: 03/29/2020 MRN: [id number] Attending MD: Carol Ada , MD Date of Birth: [birth year] CSN: [id number] Age: 86 Admit Type: Outpatient Procedure:                Small bowel enteroscopy Indications:              Iron deficiency anemia Providers:                Carol Ada, MD, Elmer Ramp. Tilden Dome, RN, Theodora Blow,                            Technician, Faustina Mbumina, Technician Referring MD:              Medicines:                Propofol per Anesthesia Complications:            No immediate complications. Estimated Blood Loss:     Estimated blood loss: none. Procedure:                Pre-Anesthesia Assessment:                           - Prior to the procedure, a History and Physical                            was performed, and patient medications and                            allergies were reviewed. The patient's tolerance of                            previous anesthesia was also reviewed. The risks                            and benefits of the procedure and the sedation                            options and risks were discussed with the patient.                            All questions were answered, and informed consent                            was obtained. Prior Anticoagulants: The patient has                            taken no previous anticoagulant or antiplatelet                            agents. ASA Grade Assessment: III - A patient with                            severe systemic disease. After reviewing the risks  and benefits, the patient was deemed in                            satisfactory condition to undergo the procedure.                           - Sedation was administered by an anesthesia                            professional. Deep sedation was attained.                           After obtaining informed consent, the endoscope was                             passed under direct vision. Throughout the                            procedure, the patient's blood pressure, pulse, and                            oxygen saturations were monitored continuously. The                            PCF-H190DL ([id number]) Olympus pediatric colonscope                            was introduced through the mouth, and advanced to                            the small bowel distal to the Ligament of Treitz.                            After obtaining informed consent, the endoscope was                            passed under direct vision. Throughout the                            procedure, the patient's blood pressure, pulse, and                            oxygen saturations were monitored continuously.The                            small bowel enteroscopy was accomplished without                            difficulty. The patient tolerated the procedure                            well. Scope In: Scope Out: Findings:      The esophagus was normal.      The stomach was normal.      The examined duodenum  was normal.      A single angiodysplastic lesion with bleeding was found in the proximal       jejunum. Coagulation for hemostasis using monopolar probe was       successful. To prevent bleeding post-intervention, one hemostatic clip       was successfully placed (MR unsafe). There was no bleeding at the end of       the procedure.      Blood was noted in the proximal jejunum and the source of the bleeding       was isolated to an AVM. Multiple applications of the APC arrested the       bleeding. To ensure no further issues with bleeding until the site       healed a hemoclip was deployed. Impression:               - Normal esophagus.                           - Normal stomach.                           - Normal examined duodenum.                           - A single bleeding angiodysplastic lesion in the                            jejunum. Treated with a monopolar  probe. Clip (MR                            unsafe) was placed.                           - No specimens collected. Recommendation:           - Patient has a contact number available for                            emergencies. The signs and symptoms of potential                            delayed complications were discussed with the                            patient. Return to normal activities tomorrow.                            Written discharge instructions were provided to the                            patient.                           - Resume regular diet.                           - Return to GI clinic in 4 weeks. Procedure Code(s):        --- Professional ---  [phone], Small intestinal endoscopy, enteroscopy                            beyond second portion of duodenum, not including                            ileum; with control of bleeding (eg, injection,                            bipolar cautery, unipolar cautery, laser, heater                            probe, stapler, plasma coagulator) Diagnosis Code(s):        --- Professional ---                           B79.49, Angiodysplasia of colon with hemorrhage                           D50.9, Iron deficiency anemia, unspecified CPT copyright 2019 American Medical Association. All rights reserved. The codes documented in this report are preliminary and upon coder review may  be revised to meet current compliance requirements. Carol Ada, MD Carol Ada, MD 03/29/2020 11:30:33 AM This report has been signed electronically. Number of Addenda: 0

## 2020-03-29 NOTE — Discharge Instructions (Signed)

## 2020-03-29 NOTE — H&P (Signed)
  [name] HPI: The patient has a history of IDA and he reports having several months of intermittent black stools. He was noted to have an HGB in the 7 range recently and over the past two months he needed to have iron infusions every two weeks. His enteroscopy and colonoscopy on 05/28/2020 was performed for IDA. He was noted to have multiple adenomas and he was recommended a 3 year follow up colonoscopy. 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 He continues to take Plavix and Dr. Baird Cancer manages his Plavix.   Past Medical History:  Diagnosis Date  . Anemia   . Anxiety   . Arthritis   . Cataract   . Chronic kidney disease   . Depression   . Diabetes mellitus without complication ([place])    type II   . GERD (gastroesophageal reflux disease)   . Gout   . Heart murmur   . Hypertension   . Myocardial infarction ([place])    minor Mi- years ago -   . Varicose veins of left lower extremity     Past Surgical History:  Procedure Laterality Date  . BUNIONECTOMY    . cataract    . COLONOSCOPY WITH PROPOFOL N/A 05/28/2017   Procedure: COLONOSCOPY WITH PROPOFOL;  Surgeon: Carol Ada, MD;  Location: [facility] ENDOSCOPY;  Service: Endoscopy;  Laterality: N/A;  . ESOPHAGOGASTRODUODENOSCOPY (EGD) WITH PROPOFOL N/A 05/28/2017   Procedure: ESOPHAGOGASTRODUODENOSCOPY (EGD) WITH PROPOFOL;  Surgeon: Carol Ada, MD;  Location: [facility] ENDOSCOPY;  Service: Endoscopy;  Laterality: N/A;    Family History  Problem Relation Age of Onset  . Breast cancer Mother   . Stomach cancer Mother   . Diabetes Mellitus II Maternal Aunt     Social History:  reports that he has quit smoking. His smoking use included cigarettes. He has a 12.50 pack-year smoking history. He has never used smokeless tobacco. He reports current alcohol use. He reports that he does not use drugs.  Allergies: No Known Allergies  Medications:  Scheduled:  Continuous: . sodium chloride    .  lactated ringers 10 mL/hr (03/29/20 0941)    Results for orders placed or performed during the hospital encounter of 03/29/20 (from the past 24 hour(s))  Glucose, capillary     Status: None   Collection Time: 03/29/20  9:30 AM  Result Value Ref Range   Glucose-Capillary 70 70 - 99 mg/dL     No results found.  ROS:  As stated above in the HPI otherwise negative.  Blood pressure (!) 152/84, pulse 78, temperature 98.1 F (36.7 C), temperature source Oral, resp. rate 16, height 6' (1.829 m), weight 86.2 kg, SpO2 100 %.    PE: Gen: NAD, Alert and Oriented HEENT:  [place]/AT, EOMI Neck: Supple, no LAD Lungs: CTA Bilaterally CV: RRR without M/G/R ABD: Soft, NTND, +BS Ext: No C/C/E  Assessment/Plan: 1) IDA - Enteroscopy and colonoscopy on Plavix.  The hope is to increase the probability of finding a bleeding site.  Maresa Morash D 03/29/2020, 10:06 AM

## 2020-03-29 NOTE — Anesthesia Postprocedure Evaluation (Signed)
Anesthesia Post Note  Patient: Alec Solis  Procedure(s) Performed: ESOPHAGOGASTRODUODENOSCOPY (EGD) WITH PROPOFOL (N/A ) COLONOSCOPY WITH PROPOFOL (N/A ) HOT HEMOSTASIS (ARGON PLASMA COAGULATION/BICAP) (N/A ) HEMOSTASIS CLIP PLACEMENT POLYPECTOMY     Patient location during evaluation: Endoscopy Anesthesia Type: MAC Level of consciousness: awake and alert Pain management: pain level controlled Vital Signs Assessment: post-procedure vital signs reviewed and stable Respiratory status: spontaneous breathing, nonlabored ventilation, respiratory function stable and patient connected to nasal cannula oxygen Cardiovascular status: blood pressure returned to baseline and stable Postop Assessment: no apparent nausea or vomiting Anesthetic complications: no   No complications documented.  Last Vitals:  Vitals:   03/29/20 1130 03/29/20 1140  BP: 137/85 (!) 150/69  Pulse:  78  Resp: 19 18  Temp:    SpO2:  96%    Last Pain:  Vitals:   03/29/20 1140  TempSrc:   PainSc: 0-No pain                 Maxime Beckner L Dreydon Cardenas

## 2020-03-29 NOTE — Transfer of Care (Signed)
Immediate Anesthesia Transfer of Care Note  Patient: Alec Solis  Procedure(s) Performed: ESOPHAGOGASTRODUODENOSCOPY (EGD) WITH PROPOFOL (N/A ) COLONOSCOPY WITH PROPOFOL (N/A ) HOT HEMOSTASIS (ARGON PLASMA COAGULATION/BICAP) (N/A ) HEMOSTASIS CLIP PLACEMENT POLYPECTOMY  Patient Location: PACU and Endoscopy Unit  Anesthesia Type:MAC  Level of Consciousness: awake, alert  and patient cooperative  Airway & Oxygen Therapy: Patient Spontanous Breathing and Patient connected to face mask oxygen  Post-op Assessment: Report given to RN and Post -op Vital signs reviewed and stable  Post vital signs: Reviewed and stable  Last Vitals:  Vitals Value Taken Time  BP    Temp    Pulse    Resp 19 03/29/20 1117  SpO2    Vitals shown include unvalidated device data.  Last Pain:  Vitals:   03/29/20 0905  TempSrc: Oral  PainSc: 0-No pain         Complications: No complications documented.

## 2020-03-29 NOTE — Anesthesia Preprocedure Evaluation (Addendum)
Anesthesia Evaluation  Patient identified by MRN, date of birth, ID band Patient awake    Reviewed: Allergy & Precautions, NPO status , Patient's Chart, lab work & pertinent test results, reviewed documented beta blocker date and time   Airway Mallampati: I  TM Distance: >3 FB Neck ROM: Full    Dental no notable dental hx. (+) Edentulous Upper, Missing, Dental Advisory Given,    Pulmonary neg pulmonary ROS, former smoker,    Pulmonary exam normal breath sounds clear to auscultation       Cardiovascular hypertension, Pt. on home beta blockers and Pt. on medications + Past MI  Normal cardiovascular exam Rhythm:Regular Rate:Normal     Neuro/Psych PSYCHIATRIC DISORDERS Anxiety Depression negative neurological ROS     GI/Hepatic Neg liver ROS, GERD  Medicated,  Endo/Other  negative endocrine ROSdiabetes, Type 2, Insulin Dependent  Renal/GU Renal InsufficiencyRenal disease  negative genitourinary   Musculoskeletal  (+) Arthritis ,   Abdominal   Peds  Hematology  (+) Blood dyscrasia (on plavix, Hgb 9.0), anemia ,   Anesthesia Other Findings   Reproductive/Obstetrics                            Anesthesia Physical Anesthesia Plan  ASA: III  Anesthesia Plan: MAC   Post-op Pain Management:    Induction: Intravenous  PONV Risk Score and Plan: 1 and Propofol infusion and Treatment may vary due to age or medical condition  Airway Management Planned: Natural Airway  Additional Equipment:   Intra-op Plan:   Post-operative Plan:   Informed Consent: I have reviewed the patients History and Physical, chart, labs and discussed the procedure including the risks, benefits and alternatives for the proposed anesthesia with the patient or authorized representative who has indicated his/her understanding and acceptance.     Dental advisory given  Plan Discussed with: CRNA  Anesthesia Plan  Comments:         Anesthesia Quick Evaluation

## 2020-04-01 LAB — SURGICAL PATHOLOGY

## 2020-04-02 ENCOUNTER — Encounter (HOSPITAL_COMMUNITY): Payer: Self-pay | Admitting: Gastroenterology

## 2020-04-09 ENCOUNTER — Other Ambulatory Visit: Payer: Self-pay

## 2020-04-09 ENCOUNTER — Encounter (HOSPITAL_COMMUNITY)
Admission: RE | Admit: 2020-04-09 | Discharge: 2020-04-09 | Disposition: A | Discharge status: Home / Self Care | Payer: Medicare Other | Source: Ambulatory Visit | Attending: Nephrology | Admitting: Nephrology

## 2020-04-09 VITALS — BP 119/76 | HR 72 | Temp 97.5°F | Resp 18

## 2020-04-09 DIAGNOSIS — D631 Anemia in chronic kidney disease: Secondary | ICD-10-CM | POA: Diagnosis not present

## 2020-04-09 DIAGNOSIS — N289 Disorder of kidney and ureter, unspecified: Secondary | ICD-10-CM

## 2020-04-09 DIAGNOSIS — N183 Chronic kidney disease, stage 3 unspecified: Secondary | ICD-10-CM | POA: Diagnosis not present

## 2020-04-09 LAB — POCT HEMOGLOBIN-HEMACUE: Hemoglobin: 9.2 g/dL — ABNORMAL LOW (ref 13.0–17.0)

## 2020-04-09 MED ORDER — EPOETIN ALFA 40000 UNIT/ML IJ SOLN
30000.0000 [IU] | INTRAMUSCULAR | Status: DC
  Administered 2020-04-09: 30000 [IU] via SUBCUTANEOUS

## 2020-04-09 MED ORDER — EPOETIN ALFA 40000 UNIT/ML IJ SOLN
INTRAMUSCULAR | Status: AC
  Filled 2020-04-09: qty 1

## 2020-04-12 ENCOUNTER — Ambulatory Visit (INDEPENDENT_AMBULATORY_CARE_PROVIDER_SITE_OTHER): Payer: Medicare Other | Admitting: Podiatry

## 2020-04-12 ENCOUNTER — Encounter: Payer: Self-pay | Admitting: Podiatry

## 2020-04-12 ENCOUNTER — Other Ambulatory Visit: Payer: Self-pay

## 2020-04-12 DIAGNOSIS — N183 Chronic kidney disease, stage 3 unspecified: Secondary | ICD-10-CM | POA: Diagnosis not present

## 2020-04-12 DIAGNOSIS — Q828 Other specified congenital malformations of skin: Secondary | ICD-10-CM | POA: Diagnosis not present

## 2020-04-12 DIAGNOSIS — M2012 Hallux valgus (acquired), left foot: Secondary | ICD-10-CM

## 2020-04-12 DIAGNOSIS — M79675 Pain in left toe(s): Secondary | ICD-10-CM | POA: Diagnosis not present

## 2020-04-12 DIAGNOSIS — L84 Corns and callosities: Secondary | ICD-10-CM | POA: Diagnosis not present

## 2020-04-12 DIAGNOSIS — M2011 Hallux valgus (acquired), right foot: Secondary | ICD-10-CM

## 2020-04-12 DIAGNOSIS — B351 Tinea unguium: Secondary | ICD-10-CM

## 2020-04-12 DIAGNOSIS — M79674 Pain in right toe(s): Secondary | ICD-10-CM | POA: Diagnosis not present

## 2020-04-12 DIAGNOSIS — E1122 Type 2 diabetes mellitus with diabetic chronic kidney disease: Secondary | ICD-10-CM

## 2020-04-13 NOTE — Progress Notes (Addendum)
Subjective: Alec Solis presents today for for annual diabetic foot examination, at risk foot care. Pt has h/o NIDDM with chronic kidney disease and callus(es) left foot and painful mycotic toenails b/l that are difficult to trim. Pain interferes with ambulation. Aggravating factors include wearing enclosed shoe gear. Pain is relieved with periodic professional debridement.   He denies any new pedal concerns on today's visit. He does still have shooting pain in feet on occasion, but this does not limit ambulation or interfere with his sleeping pattern.  Risk factors: DM, anemia, CKD, MI, chronic anticoagulation  PCP is Dr. [name] and last visit was 02/20/2020.  Past Medical History:  Diagnosis Date  . Anemia   . Anxiety   . Arthritis   . Cataract   . Chronic kidney disease   . Depression   . Diabetes mellitus without complication ([place])    type II   . GERD (gastroesophageal reflux disease)   . Gout   . Heart murmur   . Hypertension   . Myocardial infarction ([place])    minor Mi- years ago -   . Varicose veins of left lower extremity     Patient Active Problem List   Diagnosis Date Noted  . Black stools 03/06/2020  . Gastroesophageal reflux disease without esophagitis 03/06/2020  . Paget's disease of bone 10/23/2018  . Acute renal failure (ARF) ([place]) 06/11/2016  . Hyperglycemia 06/11/2016  . Deficiency anemia 12/09/2011  . Diabetes mellitus with stage 3 chronic kidney disease ([place]) 12/09/2011  . Renal insufficiency 12/09/2011    Past Surgical History:  Procedure Laterality Date  . BUNIONECTOMY    . cataract    . COLONOSCOPY WITH PROPOFOL N/A 05/28/2017   Procedure: COLONOSCOPY WITH PROPOFOL;  Surgeon: Carol Ada, MD;  Location: [facility];  Service: Endoscopy;  Laterality: N/A;  . COLONOSCOPY WITH PROPOFOL N/A 03/29/2020   Procedure: COLONOSCOPY WITH PROPOFOL;  Surgeon: Carol Ada, MD;  Location: [facility];  Service: Endoscopy;  Laterality: N/A;  .  ENTEROSCOPY N/A 03/29/2020   Procedure: ENTEROSCOPY;  Surgeon: Carol Ada, MD;  Location: [facility];  Service: Endoscopy;  Laterality: N/A;  . ESOPHAGOGASTRODUODENOSCOPY (EGD) WITH PROPOFOL N/A 05/28/2017   Procedure: ESOPHAGOGASTRODUODENOSCOPY (EGD) WITH PROPOFOL;  Surgeon: Carol Ada, MD;  Location: [facility];  Service: Endoscopy;  Laterality: N/A;  . HEMOSTASIS CLIP PLACEMENT  03/29/2020   Procedure: HEMOSTASIS CLIP PLACEMENT;  Surgeon: Carol Ada, MD;  Location: [facility];  Service: Endoscopy;;  . HOT HEMOSTASIS N/A 03/29/2020   Procedure: HOT HEMOSTASIS (ARGON PLASMA COAGULATION/BICAP);  Surgeon: Carol Ada, MD;  Location: [facility];  Service: Endoscopy;  Laterality: N/A;  . POLYPECTOMY  03/29/2020   Procedure: POLYPECTOMY;  Surgeon: Carol Ada, MD;  Location: [facility];  Service: Endoscopy;;    Current Outpatient Medications on File Prior to Visit  Medication Sig Dispense Refill  . acetaminophen (ACETAMINOPHEN 8 HOUR) 650 MG CR tablet Take 650 mg by mouth every 8 (eight) hours as needed for pain.     Marland Kitchen amLODipine (NORVASC) 5 MG tablet TAKE 1 TABLET BY MOUTH DAILY (Patient taking differently: Take 5 mg by mouth daily. ) 90 tablet 0  . aspirin 81 MG tablet Take 81 mg by mouth daily.     . B-D ULTRAFINE III SHORT PEN 31G X 8 MM MISC USE AS DIRECTED FOUR TIMES DAILY 100 each 3  . calcitRIOL (ROCALTROL) 0.25 MCG capsule Take 0.25 mcg by mouth every other day.     . carboxymethylcellulose (REFRESH PLUS) 0.5 % SOLN Place  1 drop into both eyes 3 (three) times daily as needed (Watery eyes).     . carvedilol (COREG) 12.5 MG tablet Take 12.5 mg by mouth 2 (two) times daily with a meal.    . clopidogrel (PLAVIX) 75 MG tablet TAKE 1 TABLET(75 MG) BY MOUTH DAILY (Patient taking differently: Take 75 mg by mouth daily. ) 90 tablet 2  . ferrous sulfate (QC FERROUS SULFATE) 325 (65 FE) MG tablet Take 325 mg by mouth daily with breakfast.    . furosemide (LASIX) 40 MG tablet Take 40 mg  by mouth daily as needed for fluid or edema.     Marland Kitchen glucose blood (TRUE METRIX BLOOD GLUCOSE TEST) test strip Use as directed to check blood sugars 3 times per day dx: e11.65 300 each 5  . insulin degludec (TRESIBA FLEXTOUCH) 200 UNIT/ML FlexTouch Pen Inject 22 Units into the skin daily. 9 pen 0  . Insulin Pen Needle 32G X 8 MM MISC Use as directed 100 each 2  . Multiple Vitamins-Minerals (MULTIVITAMIN ADULT PO) Take 1 tablet by mouth daily. [place] Mega men enhancement    . Multiple Vitamins-Minerals (PRESERVISION AREDS 2 PO) Take 1 tablet by mouth daily.    . naproxen sodium (ALEVE) 220 MG tablet Take 220 mg by mouth daily as needed (Body ache).    . Omega-3 Fatty Acids (FISH OIL OMEGA-3 PO) Take 1,200 mg by mouth daily.     Marland Kitchen omeprazole (PRILOSEC) 40 MG capsule TAKE 1 CAPSULE BY MOUTH EVERY DAY BEFORE A MEAL (Patient taking differently: Take 40 mg by mouth daily. ) 90 capsule 1  . rosuvastatin (CRESTOR) 20 MG tablet TAKE 1 TABLET BY MOUTH MONDAY-FRIDAY (Patient taking differently: Take 20 mg by mouth See admin instructions. TAKE 20 mg TABLET BY MOUTH MONDAY-FRIDAY) 75 tablet 1  . sodium bicarbonate 650 MG tablet Take 1,300 mg by mouth 2 (two) times daily.    . TRUEplus Lancets 30G MISC USE AS DIRECTED TO TEST BLOOD SUGAR THREE TIMES DAILY 300 each 11  . vitamin B-12 (CYANOCOBALAMIN) 1000 MCG tablet Take 1,000 mcg by mouth daily.     No current facility-administered medications on file prior to visit.     No Known Allergies  Social History   Occupational History  . Occupation: retired  Tobacco Use  . Smoking status: Former Smoker    Packs/day: 0.25    Years: 50.00    Pack years: 12.50    Types: Cigarettes  . Smokeless tobacco: Never Used  Vaping Use  . Vaping Use: Never used  Substance and Sexual Activity  . Alcohol use: Yes    Comment: occassional wine beer  . Drug use: No  . Sexual activity: Not Currently    Family History  Problem Relation Age of Onset  . Breast cancer Mother    . Stomach cancer Mother   . Diabetes Mellitus II Maternal Aunt     Immunization History  Administered Date(s) Administered  . Influenza, High Dose Seasonal PF 05/25/2018, 04/19/2019  . Influenza-Unspecified 05/24/2013, 05/31/2018  . PFIZER SARS-COV-2 Vaccination 10/23/2019, 11/15/2019  . Pneumococcal Polysaccharide-23 05/24/2013, 05/24/2017     Objective: There were no vitals filed for this visit.  Ryle Buscemi is a/an 86 y.o. African American  male, WD, WN in NAD. AAO X 3.  Vascular Examination: Capillary refill time to digits immediate b/l. Palpable DP pulses b/l. Palpable PT pulses b/l. Pedal hair absent b/l Skin temperature gradient within normal limits b/l.  Dermatological Examination: Pedal skin is thin shiny,  atrophic b/l lower extremities. No open wounds bilaterally. No interdigital macerations bilaterally. Toenails 1-5 b/l elongated, discolored, dystrophic, thickened, crumbly with subungual debris and tenderness to dorsal palpation. Hyperkeratotic lesion(s) L hallux, L 5th toe, R hallux and submet head 3 left foot.  No erythema, no edema, no drainage, no flocculence. Porokeratotic lesion(s) submet head 3 left foot. No erythema, no edema, no drainage, no flocculence.  Musculoskeletal Examination: Normal muscle strength 5/5 to all lower extremity muscle groups bilaterally, no pain crepitus or joint limitation noted with ROM b/l, bunion deformity noted b/l and hammertoes noted to the  2-5 bilaterally.  Neurological Examination: Protective sensation intact 5/5 intact bilaterally with 10g monofilament b/l. Vibratory sensation intact b/l. Clonus negative b/l.  Hemoglobin A1C Latest Ref Rng & Units 02/20/2020 11/14/2019 08/07/2019 06/01/2019 04/19/2019  HGBA1C 4.8 - 5.6 % 6.0(H) 6.4(H) 7.8(H) 8.1(H) 8.4(H)  Some recent data might be hidden   Assessment: 1. Pain due to onychomycosis of toenails of both feet   2. Corns and callosities   3. Porokeratosis   4. Hallux valgus,  acquired, bilateral   5. Diabetes mellitus with stage 3 chronic kidney disease ([place])     Plan: -Diabetic foot examination performed on today's visit. -Continue diabetic foot care principles. -Toenails 1-5 b/l were debrided in length and girth with sterile nail nippers and dremel without iatrogenic bleeding.  -Corn(s) L 5th toe and callus(es) L hallux and R hallux were pared utilizing sterile scalpel blade without incident. Total number debrided =3. -Painful porokeratotic lesion(s) submet head 3 left foot pared and enucleated with sterile scalpel blade without incident. -Patient to report any pedal injuries to medical professional immediately. -Patient to continue soft, supportive shoe gear daily. -Patient/POA to call should there be question/concern in the interim.  Return in about 3 months (around 07/13/2020).

## 2020-04-17 ENCOUNTER — Ambulatory Visit (INDEPENDENT_AMBULATORY_CARE_PROVIDER_SITE_OTHER): Payer: Medicare Other

## 2020-04-17 DIAGNOSIS — E1122 Type 2 diabetes mellitus with diabetic chronic kidney disease: Secondary | ICD-10-CM | POA: Diagnosis not present

## 2020-04-17 DIAGNOSIS — I10 Essential (primary) hypertension: Secondary | ICD-10-CM

## 2020-04-17 DIAGNOSIS — N183 Chronic kidney disease, stage 3 unspecified: Secondary | ICD-10-CM

## 2020-04-17 NOTE — Chronic Care Management (AMB) (Signed)
   Chronic Care Management Pharmacy  Name: Alec Solis  MRN: [id number] DOB: 1933-05-23  Chief Complaint/ HPI  Alec Solis,  86 y.o. , male presents for their Follow-Up CCM visit with the clinical pharmacist via telephone due to COVID-19 Pandemic to review recent blood glucose readings.  PCP : [name], MD  Diabetes   Recent Relevant Labs: Lab Results  Component Value Date/Time   HGBA1C 6.0 (H) 02/20/2020 11:42 AM   HGBA1C 6.4 (H) 11/14/2019 04:02 PM   MICROALBUR 80 02/20/2020 11:46 AM   MICROALBUR 30 01/17/2019 12:39 PM    Checking BG: 2x per Day  Recent FBG Readings: 81,80,140,132,99,110,108,115,135,144,118,118,112,142,121 Recent pre-meal BG readings: 102,146,165,160,124,117,99,160,100,170,127,122,200,170 Recent 2hr PP BG readings:  Recent HS BG readings:  Patient has failed these meds in past: Ozempic, Farxiga, Lantus, Tradjenta, [place] Patient is currently controlled on the following medications:   Tresiba 22 units in the evening  We discussed: . Diet extensively o Pt states that when his BG readings are higher it is because of the things he has eaten o Advised pt to limit sweets, carbohydrates, and sugary drinks o Discussed moderation and portion control . Exercise extensively o Pt states he is not really exercising o Recommend pt get 30 minutes of moderate intensity exercise daily 5 times a week (150 minutes total per week) o Recommend pt work up the 5 times weekly (start with a couple days a week) - Try walking early in the morning or inside the house to avoid the heat . The importance of diet and exercise to keep BG controlled without increasing insulin or adding another medication . On 8/22 pt only had 14 units of Tresiba left in pen so that is what he administered . He received new shipment of Tresiba on 8/23 and said he has about 6 pens now . Pt planning to travel home to [place] at the beginning of next month  Plan Continue current medications    Follow up plan: 3 week phone visit to assess BG readings and all medications patient is on  Jannette Fogo, PharmD Clinical Pharmacist [facility] [phone]

## 2020-04-17 NOTE — Patient Instructions (Addendum)
Visit Information  Goals Addressed            This Visit's Progress   . Diabetes Management       CARE PLAN ENTRY  Current Barriers:  . Diabetes: Controlled Lab Results  Component Value Date   HGBA1C 6.4 (H) 11/14/2019 .   Lab Results  Component Value Date   CREATININE 1.95 (H) 12/19/2019   CREATININE 2.14 (H) 12/07/2019   CREATININE 1.78 (H) 11/07/2019 .   Marland Kitchen No results found for: EGFR . Current antihyperglycemic regimen: Tresiba 22 units in the evening . Denies hypoglycemic symptoms, including dizziness, lightheadedness, shaking, sweating . Denies hyperglycemic symptoms, including polyuria, polydipsia, polyphagia, nocturia, blurred vision, neuropathy . Current meal patterns: o Breakfast and dinner only o Patient admits to eating more ice cream and pastries recently . Current exercise: Did not assess . Current blood glucose readings:  o FBG: 81,80,140,132,99,110,108,115,135,144,118,118,112,142,121 o Pre-meal BG: 102,146,165,160,124,117,99,160,100,170,127,122,200,170  Pharmacist Clinical Goal(s):  Marland Kitchen Over the next 30 days, patient will work with PharmD and primary care provider to address diabetes management after stopping treatment with Ozempic  Interventions: . Advised patient to continue checking blood sugar twice daily  . Will follow up in 4 weeks  Patient Self Care Activities:  . Patient will check blood glucose twice daily, document, and provide at future appointments . Patient will take medications as prescribed . Patient will contact provider with any episodes of hypoglycemia . Patient will report any questions or concerns to provider  . Patient will limit sweets, carbohydrates, and sugary drinks (such as juice) . Patient will begin walking a couple of days per week with a goal of 30 minutes 5 times weekly  Please see past updates related to this goal by clicking on the "Past Updates" button in the selected goal         The patient verbalized understanding  of instructions provided today and agreed to receive a mailed copy of patient instruction and/or educational materials.  Telephone follow up appointment with pharmacy team member scheduled for: 05/08/20 @ 9:00 AM  Jannette Fogo, PharmD Clinical Pharmacist [facility] [phone]   Diabetes Mellitus and Nutrition, Adult When you have diabetes (diabetes mellitus), it is very important to have healthy eating habits because your blood sugar (glucose) levels are greatly affected by what you eat and drink. Eating healthy foods in the appropriate amounts, at about the same times every day, can help you:  Control your blood glucose.  Lower your risk of heart disease.  Improve your blood pressure.  Reach or maintain a healthy weight. Every person with diabetes is different, and each person has different needs for a meal plan. Your health care provider may recommend that you work with a diet and nutrition specialist (dietitian) to make a meal plan that is best for you. Your meal plan may vary depending on factors such as:  The calories you need.  The medicines you take.  Your weight.  Your blood glucose, blood pressure, and cholesterol levels.  Your activity level.  Other health conditions you have, such as heart or kidney disease. How do carbohydrates affect me? Carbohydrates, also called carbs, affect your blood glucose level more than any other type of food. Eating carbs naturally raises the amount of glucose in your blood. Carb counting is a method for keeping track of how many carbs you eat. Counting carbs is important to keep your blood glucose at a healthy level, especially if you use insulin or take certain oral diabetes  medicines. It is important to know how many carbs you can safely have in each meal. This is different for every person. Your dietitian can help you calculate how many carbs you should have at each meal and for each snack. Foods that contain  carbs include:  Bread, cereal, rice, pasta, and crackers.  Potatoes and corn.  Peas, beans, and lentils.  Milk and yogurt.  Fruit and juice.  Desserts, such as cakes, cookies, ice cream, and candy. How does alcohol affect me? Alcohol can cause a sudden decrease in blood glucose (hypoglycemia), especially if you use insulin or take certain oral diabetes medicines. Hypoglycemia can be a life-threatening condition. Symptoms of hypoglycemia (sleepiness, dizziness, and confusion) are similar to symptoms of having too much alcohol. If your health care provider says that alcohol is safe for you, follow these guidelines:  Limit alcohol intake to no more than 1 drink per day for nonpregnant women and 2 drinks per day for men. One drink equals 12 oz of beer, 5 oz of wine, or 1 oz of hard liquor.  Do not drink on an empty stomach.  Keep yourself hydrated with water, diet soda, or unsweetened iced tea.  Keep in mind that regular soda, juice, and other mixers may contain a lot of sugar and must be counted as carbs. What are tips for following this plan?  Reading food labels  Start by checking the serving size on the "Nutrition Facts" label of packaged foods and drinks. The amount of calories, carbs, fats, and other nutrients listed on the label is based on one serving of the item. Many items contain more than one serving per package.  Check the total grams (g) of carbs in one serving. You can calculate the number of servings of carbs in one serving by dividing the total carbs by 15. For example, if a food has 30 g of total carbs, it would be equal to 2 servings of carbs.  Check the number of grams (g) of saturated and trans fats in one serving. Choose foods that have low or no amount of these fats.  Check the number of milligrams (mg) of salt (sodium) in one serving. Most people should limit total sodium intake to less than 2,300 mg per day.  Always check the nutrition information of foods  labeled as "low-fat" or "nonfat". These foods may be higher in added sugar or refined carbs and should be avoided.  Talk to your dietitian to identify your daily goals for nutrients listed on the label. Shopping  Avoid buying canned, premade, or processed foods. These foods tend to be high in fat, sodium, and added sugar.  Shop around the outside edge of the grocery store. This includes fresh fruits and vegetables, bulk grains, fresh meats, and fresh dairy. Cooking  Use low-heat cooking methods, such as baking, instead of high-heat cooking methods like deep frying.  Cook using healthy oils, such as olive, canola, or sunflower oil.  Avoid cooking with butter, cream, or high-fat meats. Meal planning  Eat meals and snacks regularly, preferably at the same times every day. Avoid going long periods of time without eating.  Eat foods high in fiber, such as fresh fruits, vegetables, beans, and whole grains. Talk to your dietitian about how many servings of carbs you can eat at each meal.  Eat 4-6 ounces (oz) of lean protein each day, such as lean meat, chicken, fish, eggs, or tofu. One oz of lean protein is equal to: ? 1 oz of meat,  chicken, or fish. ? 1 egg. ?  cup of tofu.  Eat some foods each day that contain healthy fats, such as avocado, nuts, seeds, and fish. Lifestyle  Check your blood glucose regularly.  Exercise regularly as told by your health care provider. This may include: ? 150 minutes of moderate-intensity or vigorous-intensity exercise each week. This could be brisk walking, biking, or water aerobics. ? Stretching and doing strength exercises, such as yoga or weightlifting, at least 2 times a week.  Take medicines as told by your health care provider.  Do not use any products that contain nicotine or tobacco, such as cigarettes and e-cigarettes. If you need help quitting, ask your health care provider.  Work with a Social worker or diabetes educator to identify strategies  to manage stress and any emotional and social challenges. Questions to ask a health care provider  Do I need to meet with a diabetes educator?  Do I need to meet with a dietitian?  What number can I call if I have questions?  When are the best times to check my blood glucose? Where to find more information:  American Diabetes Association: diabetes.org  Academy of Nutrition and Dietetics: www.eatright.[employer] of Diabetes and Digestive and Kidney Diseases (NIH): [employer] Summary  A healthy meal plan will help you control your blood glucose and maintain a healthy lifestyle.  Working with a diet and nutrition specialist (dietitian) can help you make a meal plan that is best for you.  Keep in mind that carbohydrates (carbs) and alcohol have immediate effects on your blood glucose levels. It is important to count carbs and to use alcohol carefully. This information is not intended to replace advice given to you by your health care provider. Make sure you discuss any questions you have with your health care provider. Document Revised: 07/23/2017 Document Reviewed: 09/14/2016 Elsevier Patient Education  2020 [employer].

## 2020-04-17 NOTE — Chronic Care Management (AMB) (Signed)
   Chronic Care Management Pharmacy  Name: Alec Solis  MRN: [id number] DOB: November 16, 1932  Chief Complaint/ HPI  Alec Solis,  86 y.o. , male presents for their Follow-Up CCM visit with the clinical pharmacist via telephone due to COVID-19 Pandemic to review recent blood glucose readings.  PCP : [name], MD  Diabetes   Recent Relevant Labs: Lab Results  Component Value Date/Time   HGBA1C 6.0 (H) 02/20/2020 11:42 AM   HGBA1C 6.4 (H) 11/14/2019 04:02 PM   MICROALBUR 80 02/20/2020 11:46 AM   MICROALBUR 30 01/17/2019 12:39 PM    Checking BG: 2x per Day  Recent FBG Readings: 119,109,112,170,131,138,123 Recent pre-meal BG readings: 145,165,149,158,162,166 Recent 2hr PP BG readings:  Recent HS BG readings:  Patient has failed these meds in past: Ozempic, Farxiga, Lantus, Tradjenta, [place] Patient is currently controlled on the following medications:   Tresiba 22 units in the evening  We discussed:  Pt reports he has been eating more ice cream and pastries lately which is most likely why his blood sugar has increased  Pt reports drinking some juice as well  HgbA1c goal of <7%, 6% right now  Counseled pt on the importance of diet and exercise  Minimize sweets, carbohydrates, and sugary drinks  Plan Continue current medications   Follow up plan: 4 week phone visit to assess BG readings  Jannette Fogo, PharmD Clinical Pharmacist [facility] [phone]

## 2020-04-17 NOTE — Patient Instructions (Addendum)
Visit Information  Goals Addressed            This Visit's Progress   . Diabetes Management       CARE PLAN ENTRY  Current Barriers:  . Diabetes: Controlled Lab Results  Component Value Date   HGBA1C 6.4 (H) 11/14/2019 .   Lab Results  Component Value Date   CREATININE 1.95 (H) 12/19/2019   CREATININE 2.14 (H) 12/07/2019   CREATININE 1.78 (H) 11/07/2019 .   Marland Kitchen No results found for: EGFR . Current antihyperglycemic regimen: Tresiba 22 units in the evening . Denies hypoglycemic symptoms, including dizziness, lightheadedness, shaking, sweating . Denies hyperglycemic symptoms, including polyuria, polydipsia, polyphagia, nocturia, blurred vision, neuropathy . Current meal patterns: o Breakfast and dinner only o Patient admits to eating more ice cream and pastries recently . Current exercise: Did not assess . Current blood glucose readings:  o FBG: 119,109,112,170,131,138,123 o Pre-meal BG: 145,165,149,158,162,166  Pharmacist Clinical Goal(s):  Marland Kitchen Over the next 30 days, patient will work with PharmD and primary care provider to address diabetes management after stopping treatment with Ozempic  Interventions: . Advised patient to continue checking blood sugar twice daily  . Will follow up in 4 weeks  Patient Self Care Activities:  . Patient will check blood glucose twice daily, document, and provide at future appointments . Patient will take medications as prescribed . Patient will contact provider with any episodes of hypoglycemia . Patient will report any questions or concerns to provider  . Patient will limit sweets, carbohydrates, and sugary drinks (such as juice)  Please see past updates related to this goal by clicking on the "Past Updates" button in the selected goal         The patient verbalized understanding of instructions provided today and agreed to receive a mailed copy of patient instruction and/or educational materials.  Telephone follow up appointment  with pharmacy team member scheduled for: 04/17/20  Jannette Fogo, PharmD Clinical Pharmacist [facility] [phone]   Diabetes Mellitus and Nutrition, Adult When you have diabetes (diabetes mellitus), it is very important to have healthy eating habits because your blood sugar (glucose) levels are greatly affected by what you eat and drink. Eating healthy foods in the appropriate amounts, at about the same times every day, can help you:  Control your blood glucose.  Lower your risk of heart disease.  Improve your blood pressure.  Reach or maintain a healthy weight. Every person with diabetes is different, and each person has different needs for a meal plan. Your health care provider may recommend that you work with a diet and nutrition specialist (dietitian) to make a meal plan that is best for you. Your meal plan may vary depending on factors such as:  The calories you need.  The medicines you take.  Your weight.  Your blood glucose, blood pressure, and cholesterol levels.  Your activity level.  Other health conditions you have, such as heart or kidney disease. How do carbohydrates affect me? Carbohydrates, also called carbs, affect your blood glucose level more than any other type of food. Eating carbs naturally raises the amount of glucose in your blood. Carb counting is a method for keeping track of how many carbs you eat. Counting carbs is important to keep your blood glucose at a healthy level, especially if you use insulin or take certain oral diabetes medicines. It is important to know how many carbs you can safely have in each meal. This is different for every person. Your  dietitian can help you calculate how many carbs you should have at each meal and for each snack. Foods that contain carbs include:  Bread, cereal, rice, pasta, and crackers.  Potatoes and corn.  Peas, beans, and lentils.  Milk and yogurt.  Fruit and juice.  Desserts,  such as cakes, cookies, ice cream, and candy. How does alcohol affect me? Alcohol can cause a sudden decrease in blood glucose (hypoglycemia), especially if you use insulin or take certain oral diabetes medicines. Hypoglycemia can be a life-threatening condition. Symptoms of hypoglycemia (sleepiness, dizziness, and confusion) are similar to symptoms of having too much alcohol. If your health care provider says that alcohol is safe for you, follow these guidelines:  Limit alcohol intake to no more than 1 drink per day for nonpregnant women and 2 drinks per day for men. One drink equals 12 oz of beer, 5 oz of wine, or 1 oz of hard liquor.  Do not drink on an empty stomach.  Keep yourself hydrated with water, diet soda, or unsweetened iced tea.  Keep in mind that regular soda, juice, and other mixers may contain a lot of sugar and must be counted as carbs. What are tips for following this plan?  Reading food labels  Start by checking the serving size on the "Nutrition Facts" label of packaged foods and drinks. The amount of calories, carbs, fats, and other nutrients listed on the label is based on one serving of the item. Many items contain more than one serving per package.  Check the total grams (g) of carbs in one serving. You can calculate the number of servings of carbs in one serving by dividing the total carbs by 15. For example, if a food has 30 g of total carbs, it would be equal to 2 servings of carbs.  Check the number of grams (g) of saturated and trans fats in one serving. Choose foods that have low or no amount of these fats.  Check the number of milligrams (mg) of salt (sodium) in one serving. Most people should limit total sodium intake to less than 2,300 mg per day.  Always check the nutrition information of foods labeled as "low-fat" or "nonfat". These foods may be higher in added sugar or refined carbs and should be avoided.  Talk to your dietitian to identify your daily  goals for nutrients listed on the label. Shopping  Avoid buying canned, premade, or processed foods. These foods tend to be high in fat, sodium, and added sugar.  Shop around the outside edge of the grocery store. This includes fresh fruits and vegetables, bulk grains, fresh meats, and fresh dairy. Cooking  Use low-heat cooking methods, such as baking, instead of high-heat cooking methods like deep frying.  Cook using healthy oils, such as olive, canola, or sunflower oil.  Avoid cooking with butter, cream, or high-fat meats. Meal planning  Eat meals and snacks regularly, preferably at the same times every day. Avoid going long periods of time without eating.  Eat foods high in fiber, such as fresh fruits, vegetables, beans, and whole grains. Talk to your dietitian about how many servings of carbs you can eat at each meal.  Eat 4-6 ounces (oz) of lean protein each day, such as lean meat, chicken, fish, eggs, or tofu. One oz of lean protein is equal to: ? 1 oz of meat, chicken, or fish. ? 1 egg. ?  cup of tofu.  Eat some foods each day that contain healthy fats, such as  avocado, nuts, seeds, and fish. Lifestyle  Check your blood glucose regularly.  Exercise regularly as told by your health care provider. This may include: ? 150 minutes of moderate-intensity or vigorous-intensity exercise each week. This could be brisk walking, biking, or water aerobics. ? Stretching and doing strength exercises, such as yoga or weightlifting, at least 2 times a week.  Take medicines as told by your health care provider.  Do not use any products that contain nicotine or tobacco, such as cigarettes and e-cigarettes. If you need help quitting, ask your health care provider.  Work with a Social worker or diabetes educator to identify strategies to manage stress and any emotional and social challenges. Questions to ask a health care provider  Do I need to meet with a diabetes educator?  Do I need to  meet with a dietitian?  What number can I call if I have questions?  When are the best times to check my blood glucose? Where to find more information:  American Diabetes Association: diabetes.org  Academy of Nutrition and Dietetics: www.eatright.[employer] of Diabetes and Digestive and Kidney Diseases (NIH): [employer] Summary  A healthy meal plan will help you control your blood glucose and maintain a healthy lifestyle.  Working with a diet and nutrition specialist (dietitian) can help you make a meal plan that is best for you.  Keep in mind that carbohydrates (carbs) and alcohol have immediate effects on your blood glucose levels. It is important to count carbs and to use alcohol carefully. This information is not intended to replace advice given to you by your health care provider. Make sure you discuss any questions you have with your health care provider. Document Revised: 07/23/2017 Document Reviewed: 09/14/2016 Elsevier Patient Education  2020 [employer].

## 2020-04-23 ENCOUNTER — Other Ambulatory Visit: Payer: Self-pay

## 2020-04-23 ENCOUNTER — Encounter (HOSPITAL_COMMUNITY)
Admission: RE | Admit: 2020-04-23 | Discharge: 2020-04-23 | Disposition: A | Discharge status: Home / Self Care | Payer: Medicare Other | Source: Ambulatory Visit | Attending: Nephrology | Admitting: Nephrology

## 2020-04-23 VITALS — BP 128/80 | HR 72 | Temp 98.1°F | Resp 20

## 2020-04-23 DIAGNOSIS — N289 Disorder of kidney and ureter, unspecified: Secondary | ICD-10-CM | POA: Diagnosis not present

## 2020-04-23 LAB — POCT HEMOGLOBIN-HEMACUE: Hemoglobin: 9.9 g/dL — ABNORMAL LOW (ref 13.0–17.0)

## 2020-04-23 MED ORDER — EPOETIN ALFA 10000 UNIT/ML IJ SOLN
INTRAMUSCULAR | Status: AC
  Administered 2020-04-23: 10000 [IU] via SUBCUTANEOUS
  Filled 2020-04-23: qty 1

## 2020-04-23 MED ORDER — EPOETIN ALFA 20000 UNIT/ML IJ SOLN
INTRAMUSCULAR | Status: AC
  Filled 2020-04-23: qty 1

## 2020-04-23 MED ORDER — EPOETIN ALFA 40000 UNIT/ML IJ SOLN: 30000.0000 [IU] | INTRAMUSCULAR | Status: DC

## 2020-04-24 DIAGNOSIS — K5521 Angiodysplasia of colon with hemorrhage: Secondary | ICD-10-CM | POA: Diagnosis not present

## 2020-04-24 DIAGNOSIS — D509 Iron deficiency anemia, unspecified: Secondary | ICD-10-CM | POA: Diagnosis not present

## 2020-05-07 ENCOUNTER — Encounter (HOSPITAL_COMMUNITY)
Admission: RE | Admit: 2020-05-07 | Discharge: 2020-05-07 | Disposition: A | Discharge status: Home / Self Care | Payer: Medicare Other | Source: Ambulatory Visit | Attending: Nephrology | Admitting: Nephrology

## 2020-05-07 ENCOUNTER — Other Ambulatory Visit: Payer: Self-pay

## 2020-05-07 VITALS — BP 140/66 | HR 40

## 2020-05-07 DIAGNOSIS — D631 Anemia in chronic kidney disease: Secondary | ICD-10-CM | POA: Insufficient documentation

## 2020-05-07 DIAGNOSIS — N183 Chronic kidney disease, stage 3 unspecified: Secondary | ICD-10-CM | POA: Diagnosis not present

## 2020-05-07 DIAGNOSIS — N289 Disorder of kidney and ureter, unspecified: Secondary | ICD-10-CM

## 2020-05-07 LAB — FERRITIN: Ferritin: 784 ng/mL — ABNORMAL HIGH (ref 24–336)

## 2020-05-07 LAB — RENAL FUNCTION PANEL
Albumin: 3.6 g/dL (ref 3.5–5.0)
Anion gap: 8 (ref 5–15)
BUN: 29 mg/dL — ABNORMAL HIGH (ref 8–23)
CO2: 21 mmol/L — ABNORMAL LOW (ref 22–32)
Calcium: 9.2 mg/dL (ref 8.9–10.3)
Chloride: 111 mmol/L (ref 98–111)
Creatinine, Ser: 1.78 mg/dL — ABNORMAL HIGH (ref 0.61–1.24)
GFR calc Af Amer: 40 mL/min — ABNORMAL LOW (ref >60)
GFR calc non Af Amer: 34 mL/min — ABNORMAL LOW (ref >60)
Glucose, Bld: 146 mg/dL — ABNORMAL HIGH (ref 70–99)
Phosphorus: 3.2 mg/dL (ref 2.5–4.6)
Potassium: 4.2 mmol/L (ref 3.5–5.1)
Sodium: 140 mmol/L (ref 135–145)

## 2020-05-07 LAB — POCT HEMOGLOBIN-HEMACUE: Hemoglobin: 9.9 g/dL — ABNORMAL LOW (ref 13.0–17.0)

## 2020-05-07 LAB — IRON AND TIBC
Iron: 55 ug/dL (ref 45–182)
Saturation Ratios: 19 % (ref 17.9–39.5)
TIBC: 284 ug/dL (ref 250–450)
UIBC: 229 ug/dL

## 2020-05-07 MED ORDER — EPOETIN ALFA 10000 UNIT/ML IJ SOLN
INTRAMUSCULAR | Status: AC
  Administered 2020-05-07: 10000 [IU]
  Filled 2020-05-07: qty 1

## 2020-05-07 MED ORDER — EPOETIN ALFA 10000 UNIT/ML IJ SOLN: 30000.0000 [IU] | INTRAMUSCULAR | Status: DC

## 2020-05-07 MED ORDER — EPOETIN ALFA 20000 UNIT/ML IJ SOLN
INTRAMUSCULAR | Status: AC
  Administered 2020-05-07: 20000 [IU]
  Filled 2020-05-07: qty 1

## 2020-05-08 ENCOUNTER — Ambulatory Visit: Payer: Medicare Other

## 2020-05-08 ENCOUNTER — Other Ambulatory Visit: Payer: Self-pay

## 2020-05-08 DIAGNOSIS — E7800 Pure hypercholesterolemia, unspecified: Secondary | ICD-10-CM

## 2020-05-08 DIAGNOSIS — I129 Hypertensive chronic kidney disease with stage 1 through stage 4 chronic kidney disease, or unspecified chronic kidney disease: Secondary | ICD-10-CM

## 2020-05-08 DIAGNOSIS — K219 Gastro-esophageal reflux disease without esophagitis: Secondary | ICD-10-CM

## 2020-05-08 DIAGNOSIS — E1122 Type 2 diabetes mellitus with diabetic chronic kidney disease: Secondary | ICD-10-CM

## 2020-05-08 NOTE — Chronic Care Management (AMB) (Signed)
Chronic Care Management Pharmacy  Name: Alec Solis  MRN: [id number] DOB: [birth year]  Chief Complaint/ HPI  Alec Solis,  86 y.o. , male presents for their Follow-Up CCM visit with the clinical pharmacist via telephone due to COVID-19 Pandemic. Pt reports he is till vomiting once or twice a month, but does not have any nausea or abdominal pain.   PCP : [name], MD  Their chronic conditions include: Hypertension, Hyperlipidemia, Diabetes, GERD and Chronic Kidney Disease  Office Visits: 03/18/20 Telephone call: Pt notified sample for Tresiba ready for pick up  02/20/20 OV: DM follow up. BG has been fine, but pt still having abdominal discomfort. HgbA1c down to 6.0%. Referred to Gastroenterology for abdominal discomfort and black stools. .  01/02/20 OV: DM follow up. Pt did not stop Ozempic at last OV as directed. Pt reports he only took [place] for two days. Advised pt to stop Ozempic again. Referred to CCM pharmacy to follow BG. Met with CCM pharmacist in office today. Advised to take Dexilant in place of omeprazole.   12/07/19 OV: Presented for further evaluation of nausea and vomiting accompanied by decreased appetite. Pt advised to stop Ozempic. Pt advised that BG will likely increase and we may need to add meal time insulin. Advised to take Dexilant twice daily for the next 5 days. Amylase elevated.  11/14/19 OV: DM and Ozempic follow up. Pt complains of intermittent nausea. States he feels sick on his stomach when awakening. Also has some vomiting. Unable to determine what triggers symptoms. Discussed nausea may be from [place], pt does not wish to stop med at this time. Benign abdominal exam. Pt advised to stop omeprazole and start Dexilant once daily (samples given). HgbA1c has decreased to 6.4%.   Consult Visits: 05/07/20 Epoetin alfa injection  04/23/20 Epoetin alfa injection  04/12/20 Podiatry OV w/ Dr. Elisha Ponder: Diabetic foot exam performed. Continue diabetic foot care  principles. Toenails 1-5 bilateral debrided. Corns and calluses were pared.   03/29/20 Colonoscopy: Noted multiple adenomas and recommended to have 3 year follow up colonoscopy. 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   03/26/20 Epoetin alfa injection  03/12/20 Epoetin alfa injection  02/27/20 Epoetin alfa injection  02/13/20 Epoetin alfa injection  01/30/20 Epoetin alfa injection  01/16/20 Epoetin alfa injection  01/02/20 Epoetin alfa injection  12/19/19 Epoeitin alfa injection  12/12/19 Podiatry OV w/ Dr. Elisha Ponder: Annual diabetic foot exam and at risk foot care. Toenails 1-5 bilateral debrided. Calluses debrided. Return in 3 months.   12/05/19 Epoetin alfa injection  11/21/19: Epoeitin alfa injection  11/07/19: Epoetin alfa 1000 units/ml injection  CCM Encounters:  Medications: Outpatient Encounter Medications as of 05/08/2020  Medication Sig  . acetaminophen (ACETAMINOPHEN 8 HOUR) 650 MG CR tablet Take 650 mg by mouth every 8 (eight) hours as needed for pain.   Marland Kitchen allopurinol (ZYLOPRIM) 100 MG tablet Take 100 mg by mouth 2 (two) times daily.  Marland Kitchen amLODipine (NORVASC) 5 MG tablet TAKE 1 TABLET BY MOUTH DAILY (Patient taking differently: Take 5 mg by mouth daily. )  . aspirin 81 MG tablet Take 81 mg by mouth daily.   . B-D ULTRAFINE III SHORT PEN 31G X 8 MM MISC USE AS DIRECTED FOUR TIMES DAILY  . calcitRIOL (ROCALTROL) 0.25 MCG capsule Take 0.25 mcg by mouth every other day.   . carboxymethylcellulose (REFRESH PLUS) 0.5 % SOLN Place 1 drop into both eyes 3 (three) times daily as needed (Watery eyes).   . carvedilol (COREG) 12.5  MG tablet Take 12.5 mg by mouth 2 (two) times daily with a meal.  . clopidogrel (PLAVIX) 75 MG tablet TAKE 1 TABLET(75 MG) BY MOUTH DAILY (Patient taking differently: Take 75 mg by mouth daily. )  . ferrous sulfate (QC FERROUS SULFATE) 325 (65 FE) MG tablet Take 325 mg by mouth daily with breakfast.  . furosemide (LASIX) 40 MG  tablet Take 40 mg by mouth daily as needed for fluid or edema.   Marland Kitchen glucose blood (TRUE METRIX BLOOD GLUCOSE TEST) test strip Use as directed to check blood sugars 3 times per day dx: e11.65  . insulin degludec (TRESIBA FLEXTOUCH) 200 UNIT/ML FlexTouch Pen Inject 22 Units into the skin daily.  . Insulin Pen Needle 32G X 8 MM MISC Use as directed  . Misc Natural Products (MENS PROSTATE HEALTH FORMULA PO) Take 1 capsule by mouth daily. New Vitality Super Beta Prostate  . Multiple Vitamins-Minerals (MULTIVITAMIN ADULT PO) Take 1 tablet by mouth daily. [place] Mega men enhancement  . Multiple Vitamins-Minerals (PRESERVISION AREDS 2 PO) Take 1 tablet by mouth daily.  . naproxen sodium (ALEVE) 220 MG tablet Take 220 mg by mouth daily as needed (Body ache).  . Omega-3 Fatty Acids (FISH OIL OMEGA-3 PO) Take 1,200 mg by mouth daily.   Marland Kitchen omeprazole (PRILOSEC) 40 MG capsule TAKE 1 CAPSULE BY MOUTH EVERY DAY BEFORE A MEAL (Patient taking differently: Take 40 mg by mouth daily. )  . rosuvastatin (CRESTOR) 20 MG tablet TAKE 1 TABLET BY MOUTH MONDAY-FRIDAY (Patient taking differently: Take 20 mg by mouth See admin instructions. TAKE 20 mg TABLET BY MOUTH MONDAY-FRIDAY)  . sodium bicarbonate 650 MG tablet Take 1,300 mg by mouth 2 (two) times daily.  . TRUEplus Lancets 30G MISC USE AS DIRECTED TO TEST BLOOD SUGAR THREE TIMES DAILY  . vitamin B-12 (CYANOCOBALAMIN) 1000 MCG tablet Take 1,000 mcg by mouth daily.   No facility-administered encounter medications on file as of 05/08/2020.    Current Diagnosis/Assessment:  SDOH Interventions     Most Recent Value  SDOH Interventions  Financial Strain Interventions Other (Comment)  [Will continue to assist with [place] patient assistance refills as needed]      Goals Addressed            This Visit's Progress   . Pharmacy Care Plan       CARE PLAN ENTRY (see longitudinal plan of care for additional care plan information)  Current Barriers:  . Chronic Disease  Management support, education, and care coordination needs related to Hypertension, Hyperlipidemia, Diabetes, and GERD   Hypertension BP Readings from Last 3 Encounters:  05/07/20 140/66  04/23/20 128/80  04/09/20 119/76   . Pharmacist Clinical Goal(s): o Over the next 90 days, patient will work with PharmD and providers to maintain BP goal <130/80 . Current regimen:  o Amlodipine 29m daily o Carvedilol 12.532mtwice daily . Interventions: o Provided dietary and exercise recommendations o Discussed appropriate goal for blood pressure (less than 130/80) o Advised patient that he can bring blood pressure monitor from home to next office visit to check it against BP monitor in office to ensure it is accurate . Patient self care activities - Over the next 90 days, patient will: o Check BP once weekly and if symptomatic, document, and provide at future appointments o Ensure daily salt intake < 2300 mg/day o Try to exercise for 30 minutes 5 times weekly  Hyperlipidemia Lab Results  Component Value Date/Time   LDLCALC 38 06/01/2019 02:16 PM   .  Pharmacist Clinical Goal(s): o Over the next 180 days, patient will work with PharmD and providers to maintain LDL goal < 70 . Current regimen:  o Rosuvastatin 76m daily Monday through Friday  . Interventions: o Provided dietary and exercise recommendations o Discussed appropriate goals for HDL (greater than 40) and triglycerides (less than 150) o Provided dietary information regarding heathy fats . Patient self care activities - Over the next 180 days, patient will: o Try to exercise for 30 minutes 5 times weekly o Increase intake of healthy fats (fish, walnuts, avocados, flaxseed, etc)  Diabetes Lab Results  Component Value Date/Time   HGBA1C 6.0 (H) 02/20/2020 11:42 AM   HGBA1C 6.4 (H) 11/14/2019 04:02 PM   . Pharmacist Clinical Goal(s): o Over the next 180 days, patient will work with PharmD and providers to maintain A1c goal  <7% . Current regimen:  o Tresiba 22 units daily . Interventions: o Provided dietary and exercise recommendations o Reviewed and discussed recent blood glucose readings in depth o Recommend patient increase water intake to 64 ounces daily o No changes recommended to insulin regimen at this time . Patient self care activities - Over the next 180 days, patient will: o Check blood sugar twice daily, document, and provide at future appointments o Contact provider with any episodes of hypoglycemia o Try to exercise for 30 minutes 5 times weekly  GERD . Pharmacist Clinical Goal(s) o Over the next 180 days, patient will work with PharmD and providers to manage symptoms of reflux . Current regimen:  o Omeprazole 478mdaily . Interventions: o Determined patient went back to taking omeprazole after finishing Dexilant samples o Discussed patient restarting omeprazole with PCP and PCP okay with that . Patient self care activities - Over the next 180 days, patient will: o Continue omeprazole o Notify PCP if symptoms worsen  Medication management . Pharmacist Clinical Goal(s): o Over the next 180 days, patient will work with PharmD and providers to maintain optimal medication adherence . Current pharmacy: [facility] . Interventions o Comprehensive medication review performed. o Continue current medication management strategy o Perform cost comparison to determine if patient can use [facility] pharmacy with current insurance o Collaborate with Dr. HuBenson [place] allopurinol prescription being removed from medication list on 8/6. Notify patient if medication needs to be continued o Discuss indication for clopidogrel at follow up visit . Patient self care activities - Over the next 180 days, patient will: o Focus on medication adherence by considering use of pill box or adherence packaging o Take medications as prescribed o Report any questions or concerns to PharmD and/or provider(s)  Initial goal  documentation        Diabetes   A1c goal <7%  Recent Relevant Labs: Lab Results  Component Value Date/Time   HGBA1C 6.0 (H) 02/20/2020 11:42 AM   HGBA1C 6.4 (H) 11/14/2019 04:02 PM   MICROALBUR 80 02/20/2020 11:46 AM   MICROALBUR 30 01/17/2019 12:39 PM    Last diabetic Eye exam:  Lab Results  Component Value Date/Time   HMDIABEYEEXA No Retinopathy 03/22/2020 12:00 AM    Last diabetic Foot exam: 04/12/20  Checking BG: 2x per Day  Recent FBG Readings: 9621,194,17,40,814,481,856,31,497,026,378,588,502,774,12,878,67,672,09105,95 Recent pre-meal BG readings: 112,135,170,125,190,98,170,120,103,170,170,104,160,110,137,134,126,84,108,160  Patient has failed these meds in past: Ozempic, Farxiga, Novolog, Tradjenta, [place] is currently controlled on the following medications: . TrTyler Aas2 units daily  We discussed:  . Confirmed pt is using 22 units of Tresiba daily . Pt mentions a low BG of 73  one morning and he experienced shaking  o Drinks cranberry juice when BG is low o Recommended glucose tablets to keep by bedside or with him for low BG . Diet extensively o Barbecue, Kool-aid, ice cream, cake, Pepsi o Eating a lot of salads o Has been drinking 2 bottles of water daily - Also drinking ginger ale, ice tea, lemonade  . Pt says he doesn't add much sugar to tea or lemonade - Recommend pt drink 64 ounces of water daily; swap out a glass of tea or lemonade for water . Exercise extensively o Not exercising o Walks occasionally, but can't leave wife at home and she can't keep up with him walking o No family in the area to assist o No exercise equipment in the home  o Recommend pt get 30 minutes of moderate intensity exercise daily 5 times a week (150 minutes total per week) o Recommend pt walk in the house daily . Discussed that FBG readings were great and that a couple of evening readings were elevated, but no changes to insulin needed at this time  Plan Continue  current medications   Hyperlipidemia   LDL goal < 70  Lipid Panel     Component Value Date/Time   CHOL 103 06/01/2019 1416   TRIG 263 (H) 06/01/2019 1416   HDL 24 (L) 06/01/2019 1416   LDLCALC 38 06/01/2019 1416    Hepatic Function Latest Ref Rng & Units 05/07/2020 03/26/2020 01/30/2020  Total Protein 6.0 - 8.5 g/dL - - -  Albumin 3.5 - 5.0 g/dL 3.6 3.7 3.7  AST 0 - 40 IU/L - - -  ALT 0 - 44 IU/L - - -  Alk Phosphatase 39 - 117 IU/L - - -  Total Bilirubin 0.0 - 1.2 mg/dL - - -  Bilirubin, Direct 0.00 - 0.40 mg/dL - - -     The ASCVD Risk score Mikey Bussing DC Jr., et al., 2013) failed to calculate for the following reasons:   The 2013 ASCVD risk score is only valid for ages 11 to 41   Patient has failed these meds in past: [place] Patient is currently controlled on the following medications:  . Rosuvastatin 27m daily Monday through Friday  We discussed:   . Denies eating snack foods, but eats cookies . Discussed HDL and triglycerides o Send recommendations for foods high in "good" fat . Advised pt that diet and exercise will help increase HDL and decrease triglycerides  Plan Continue current medications  Recommend lipid panel at next PCP visit  Hypertension   BP goal is:  <130/80  Office blood pressures are  BP Readings from Last 3 Encounters:  05/07/20 140/66  04/23/20 128/80  04/09/20 119/76   Patient checks BP at home Never Patient home BP readings are ranging: 140/66  Patient has failed these meds in the past: N/A Patient is currently controlled on the following medications:  . Carvedilol 12.574mtwice daily . Amlodipine 20m23maily  We discussed: . GMarland Kitchenal BP <130/80 . Very seldom gets headaches . Occasionally gets dizzy when bending down or standing up too fast o Sits down and that takes care of it  . Recommend pt check BP if symptomatic and once a week o Told pt we could calibrate BP cuff for him in office (he mentioned concern that home BP monitor does not read  the same as office monitor)  Plan Continue current medications   Chronic Kidney Disease   Patient is currently controlled on the following medications:  Calcitriol 0.85mg every other day  Furosemide 420mdaily as needed for fluid or edema  Epoetin alfa injections  Ferrous sulfate 32568maily with breakfast  Sodium bicarbonate 650m45mtablets by mouth twice daily  We discussed:    Takes furosemide maybe once a week for fluid  Plan Continue current medications   GERD   Patient has failed these meds in past: Dexi[place]ient is currently controlled on the following medications:  . Omeprazole 40mg65mly . Ondansetron 4mg a54meeded for nausea  We discussed:    Pt went back to taking omeprazole after he finished Dexilant samples provided at PCP office visit (omeprazole refilled in July)  Pt states symptoms of nausea and abdominal pain have improved (still vomits occasionally)  Discussed with PCP that pt had gone back to taking omeprazole, she stated that was okay  Plan Continue current medications  Gout  Uric acid: 4.3 on 08/07/19  Patient has failed these meds in past: [place] Patient is currently controlled on the following medications:   Allopurinol 100mg t22m daily  We discussed:    GI (Dr. Hung) dBenson Norway[place]inued allopurinol on 8/6  Pt reported taking allopurinol at today's office visit  Plan Continue current medications  Contact Dr. Hung's Ulyses Amor to determine reason for discontinuing allopurinol. Will notify pt if he needs to discontinue medication.   Health Maintenance   Patient is currently on the following medications:  . VitamMarland Kitchenn B12 1000mcg d62m . New Vitality Super beta prostate once daily  . B&L Preservision twice daily . [employer] Mega[place]inn energy and metabolism daily . Omega-3 fish oil 1200mg dai30m Tylenol 650mg very78mours as needed . Naproxen 220mg daily72mneeded . Aspirin 81mg daily 59mopidogrel 75mg daily .50mresh 1 drop in both  eyes three times daily as needed  We discussed:   . Pt reports taking naproxen 1 tablet once a month for pain in knees, elbow, neck o Only takes naproxen if Tylenol is not helping the pain . Has generic Voltaren gel and it helps with pain . Advised pt that Voltaren is safer for him to use than Naproxen . Advised pt speak with Nephrologist about using Naproxen due to kidney concerns . Unable to determine indication for Plavix from available medical records (pt was on this medication when he established care after moving to [place] from [place]) [facility] with PCP and PCP staff  Plan Continue current medications  Will continue to investigate indication for clopidogrel. Discuss at follow up  Vaccines   Reviewed and discussed patient's vaccination history. No [facility] records  Immunization History  Administered Date(s) Administered  . Influenza, High Dose Seasonal PF 05/25/2018, 04/19/2019  . Influenza-Unspecified 05/24/2013, 05/31/2018  . PFIZER SARS-COV-2 Vaccination 10/23/2019, 11/15/2019  . 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 No - Feels he has too many for pill box Pt endorses 100% compliance  We discussed:  . Importance of taking each medications daily as directed  Plan Continue current medication management strategy  Compare medication prices with [facility]   Follow up: 2 month phone visit  Jannette Fogo, PharmD Clinical Pharmacist [facility] [phone]

## 2020-05-08 NOTE — Patient Instructions (Addendum)
Visit Information  Goals Addressed            This Visit's Progress   . Pharmacy Care Plan       CARE PLAN ENTRY (see longitudinal plan of care for additional care plan information)  Current Barriers:  . Chronic Disease Management support, education, and care coordination needs related to Hypertension, Hyperlipidemia, Diabetes, and GERD   Hypertension BP Readings from Last 3 Encounters:  05/07/20 140/66  04/23/20 128/80  04/09/20 119/76   . Pharmacist Clinical Goal(s): o Over the next 90 days, patient will work with PharmD and providers to maintain BP goal <130/80 . Current regimen:  o Amlodipine 5mg  daily o Carvedilol 12.5mg  twice daily . Interventions: o Provided dietary and exercise recommendations o Discussed appropriate goal for blood pressure (less than 130/80) o Advised patient that he can bring blood pressure monitor from home to next office visit to check it against BP monitor in office to ensure it is accurate . Patient self care activities - Over the next 90 days, patient will: o Check BP once weekly and if symptomatic, document, and provide at future appointments o Ensure daily salt intake < 2300 mg/day o Try to exercise for 30 minutes 5 times weekly  Hyperlipidemia Lab Results  Component Value Date/Time   LDLCALC 38 06/01/2019 02:16 PM   . Pharmacist Clinical Goal(s): o Over the next 180 days, patient will work with PharmD and providers to maintain LDL goal < 70 . Current regimen:  o Rosuvastatin 20mg  daily Monday through Friday  . Interventions: o Provided dietary and exercise recommendations o Discussed appropriate goals for HDL (greater than 40) and triglycerides (less than 150) o Provided dietary information regarding heathy fats . Patient self care activities - Over the next 180 days, patient will: o Try to exercise for 30 minutes 5 times weekly o Increase intake of healthy fats (fish, walnuts, avocados, flaxseed, etc)  Diabetes Lab Results   Component Value Date/Time   HGBA1C 6.0 (H) 02/20/2020 11:42 AM   HGBA1C 6.4 (H) 11/14/2019 04:02 PM   . Pharmacist Clinical Goal(s): o Over the next 180 days, patient will work with PharmD and providers to maintain A1c goal <7% . Current regimen:  o Tresiba 22 units daily . Interventions: o Provided dietary and exercise recommendations o Reviewed and discussed recent blood glucose readings in depth o Recommend patient increase water intake to 64 ounces daily o No changes recommended to insulin regimen at this time . Patient self care activities - Over the next 180 days, patient will: o Check blood sugar twice daily, document, and provide at future appointments o Contact provider with any episodes of hypoglycemia o Try to exercise for 30 minutes 5 times weekly  GERD . Pharmacist Clinical Goal(s) o Over the next 180 days, patient will work with PharmD and providers to manage symptoms of reflux . Current regimen:  o Omeprazole 40mg  daily . Interventions: o Determined patient went back to taking omeprazole after finishing Dexilant samples o Discussed patient restarting omeprazole with PCP and PCP okay with that . Patient self care activities - Over the next 180 days, patient will: o Continue omeprazole o Notify PCP if symptoms worsen  Medication management . Pharmacist Clinical Goal(s): o Over the next 180 days, patient will work with PharmD and providers to maintain optimal medication adherence . Current pharmacy: [facility] . Interventions o Comprehensive medication review performed. o Continue current medication management strategy o Perform cost comparison to determine if patient can use [facility]  with current insurance o Collaborate with Dr. Benson Norway about allopurinol prescription being removed from medication list on 8/6. Notify patient if medication needs to be continued o Discuss indication for clopidogrel at follow up visit . Patient self care activities - Over the  next 180 days, patient will: o Focus on medication adherence by considering use of pill box or adherence packaging o Take medications as prescribed o Report any questions or concerns to PharmD and/or provider(s)  Initial goal documentation        The patient verbalized understanding of instructions provided today and agreed to receive a mailed copy of patient instruction and/or educational materials.  Telephone follow up appointment with pharmacy team member scheduled for: 07/10/20 @ 3:30 PM  Jannette Fogo, PharmD Clinical Pharmacist [facility] [phone]   Heart-Healthy Eating Plan Heart-healthy meal planning includes:  Eating less unhealthy fats.  Eating more healthy fats.  Making other changes in your diet. Talk with your doctor or a diet specialist (dietitian) to create an eating plan that is right for you. What is my plan? Your doctor may recommend an eating plan that includes:  Total fat: ______% or less of total calories a day.  Saturated fat: ______% or less of total calories a day.  Cholesterol: less than _________mg a day. What are tips for following this plan? Cooking Avoid frying your food. Try to bake, boil, grill, or broil it instead. You can also reduce fat by:  Removing the skin from poultry.  Removing all visible fats from meats.  Steaming vegetables in water or broth. Meal planning   At meals, divide your plate into four equal parts: ? Fill one-half of your plate with vegetables and green salads. ? Fill one-fourth of your plate with whole grains. ? Fill one-fourth of your plate with lean protein foods.  Eat 4-5 servings of vegetables per day. A serving of vegetables is: ? 1 cup of raw or cooked vegetables. ? 2 cups of raw leafy greens.  Eat 4-5 servings of fruit per day. A serving of fruit is: ? 1 medium whole fruit. ?  cup of dried fruit. ?  cup of fresh, frozen, or canned fruit. ?  cup of 100% fruit  juice.  Eat more foods that have soluble fiber. These are apples, broccoli, carrots, beans, peas, and barley. Try to get 20-30 g of fiber per day.  Eat 4-5 servings of nuts, legumes, and seeds per week: ? 1 serving of dried beans or legumes equals  cup after being cooked. ? 1 serving of nuts is  cup. ? 1 serving of seeds equals 1 tablespoon. General information  Eat more home-cooked food. Eat less restaurant, buffet, and fast food.  Limit or avoid alcohol.  Limit foods that are high in starch and sugar.  Avoid fried foods.  Lose weight if you are overweight.  Keep track of how much salt (sodium) you eat. This is important if you have high blood pressure. Ask your doctor to tell you more about this.  Try to add vegetarian meals each week. Fats  Choose healthy fats. These include olive oil and canola oil, flaxseeds, walnuts, almonds, and seeds.  Eat more omega-3 fats. These include salmon, mackerel, sardines, tuna, flaxseed oil, and ground flaxseeds. Try to eat fish at least 2 times each week.  Check food labels. Avoid foods with trans fats or high amounts of saturated fat.  Limit saturated fats. ? These are often found in animal products, such as meats, butter, and  cream. ? These are also found in plant foods, such as palm oil, palm kernel oil, and coconut oil.  Avoid foods with partially hydrogenated oils in them. These have trans fats. Examples are stick margarine, some tub margarines, cookies, crackers, and other baked goods. What foods can I eat? Fruits All fresh, canned (in natural juice), or frozen fruits. Vegetables Fresh or frozen vegetables (raw, steamed, roasted, or grilled). Green salads. Grains Most grains. Choose whole wheat and whole grains most of the time. Rice and pasta, including brown rice and pastas made with whole wheat. Meats and other proteins Lean, well-trimmed beef, veal, pork, and lamb. Chicken and [place] without skin. All fish and shellfish.  Wild duck, rabbit, pheasant, and venison. Egg whites or low-cholesterol egg substitutes. Dried beans, peas, lentils, and tofu. Seeds and most nuts. Dairy Low-fat or nonfat cheeses, including ricotta and mozzarella. Skim or 1% milk that is liquid, powdered, or evaporated. Buttermilk that is made with low-fat milk. Nonfat or low-fat yogurt. Fats and oils Non-hydrogenated (trans-free) margarines. Vegetable oils, including soybean, sesame, sunflower, olive, peanut, safflower, corn, canola, and cottonseed. Salad dressings or mayonnaise made with a vegetable oil. Beverages Mineral water. Coffee and tea. Diet carbonated beverages. Sweets and desserts Sherbet, gelatin, and fruit ice. Small amounts of dark chocolate. Limit all sweets and desserts. Seasonings and condiments All seasonings and condiments. The items listed above may not be a complete list of foods and drinks you can eat. Contact a dietitian for more options. What foods should I avoid? Fruits Canned fruit in heavy syrup. Fruit in cream or butter sauce. Fried fruit. Limit coconut. Vegetables Vegetables cooked in cheese, cream, or butter sauce. Fried vegetables. Grains Breads that are made with saturated or trans fats, oils, or whole milk. Croissants. Sweet rolls. Donuts. High-fat crackers, such as cheese crackers. Meats and other proteins Fatty meats, such as hot dogs, ribs, sausage, bacon, rib-eye roast or steak. High-fat deli meats, such as salami and bologna. Caviar. Domestic duck and goose. Organ meats, such as liver. Dairy Cream, sour cream, cream cheese, and creamed cottage cheese. Whole-milk cheeses. Whole or 2% milk that is liquid, evaporated, or condensed. Whole buttermilk. Cream sauce or high-fat cheese sauce. Yogurt that is made from whole milk. Fats and oils Meat fat, or shortening. Cocoa butter, hydrogenated oils, palm oil, coconut oil, palm kernel oil. Solid fats and shortenings, including bacon fat, salt pork, lard, and  butter. Nondairy cream substitutes. Salad dressings with cheese or sour cream. Beverages Regular sodas and juice drinks with added sugar. Sweets and desserts Frosting. Pudding. Cookies. Cakes. Pies. Milk chocolate or white chocolate. Buttered syrups. Full-fat ice cream or ice cream drinks. The items listed above may not be a complete list of foods and drinks to avoid. Contact a dietitian for more information. Summary  Heart-healthy meal planning includes eating less unhealthy fats, eating more healthy fats, and making other changes in your diet.  Eat a balanced diet. This includes fruits and vegetables, low-fat or nonfat dairy, lean protein, nuts and legumes, whole grains, and heart-healthy oils and fats. This information is not intended to replace advice given to you by your health care provider. Make sure you discuss any questions you have with your health care provider. Document Revised: 10/14/2017 Document Reviewed: 09/17/2017 Elsevier Patient Education  2020 [employer].

## 2020-05-12 LAB — PTH, INTACT AND CALCIUM
Calcium, Total (PTH): 9.1 mg/dL (ref 8.6–10.2)
PTH: 48 pg/mL (ref 15–65)

## 2020-05-21 ENCOUNTER — Encounter (HOSPITAL_COMMUNITY)
Admission: RE | Admit: 2020-05-21 | Discharge: 2020-05-21 | Disposition: A | Discharge status: Home / Self Care | Payer: Medicare Other | Source: Ambulatory Visit | Attending: Nephrology | Admitting: Nephrology

## 2020-05-21 ENCOUNTER — Other Ambulatory Visit: Payer: Self-pay

## 2020-05-21 VITALS — BP 157/86 | HR 75 | Temp 97.3°F | Resp 20

## 2020-05-21 DIAGNOSIS — D631 Anemia in chronic kidney disease: Secondary | ICD-10-CM | POA: Diagnosis not present

## 2020-05-21 DIAGNOSIS — N183 Chronic kidney disease, stage 3 unspecified: Secondary | ICD-10-CM | POA: Diagnosis not present

## 2020-05-21 DIAGNOSIS — N289 Disorder of kidney and ureter, unspecified: Secondary | ICD-10-CM

## 2020-05-21 MED ORDER — EPOETIN ALFA 20000 UNIT/ML IJ SOLN
INTRAMUSCULAR | Status: AC
  Administered 2020-05-21: 20000 [IU]
  Filled 2020-05-21: qty 1

## 2020-05-21 MED ORDER — EPOETIN ALFA 40000 UNIT/ML IJ SOLN: 30000.0000 [IU] | INTRAMUSCULAR | Status: DC

## 2020-05-21 MED ORDER — EPOETIN ALFA 10000 UNIT/ML IJ SOLN
INTRAMUSCULAR | Status: AC
  Administered 2020-05-21: 10000 [IU]
  Filled 2020-05-21: qty 1

## 2020-05-22 ENCOUNTER — Other Ambulatory Visit: Payer: Self-pay | Admitting: Internal Medicine

## 2020-05-22 LAB — POCT HEMOGLOBIN-HEMACUE: Hemoglobin: 11 g/dL — ABNORMAL LOW (ref 13.0–17.0)

## 2020-05-23 NOTE — Telephone Encounter (Signed)
Error

## 2020-05-25 DIAGNOSIS — Z23 Encounter for immunization: Secondary | ICD-10-CM | POA: Diagnosis not present

## 2020-05-30 ENCOUNTER — Ambulatory Visit: Payer: Medicare Other

## 2020-05-30 ENCOUNTER — Other Ambulatory Visit: Payer: Self-pay

## 2020-05-30 VITALS — BP 120/74 | HR 95 | Temp 98.2°F

## 2020-05-30 DIAGNOSIS — Z1152 Encounter for screening for COVID-19: Secondary | ICD-10-CM

## 2020-05-30 NOTE — Progress Notes (Signed)
Pt here today for COVID testing

## 2020-05-31 LAB — SARS-COV-2, NAA 2 DAY TAT

## 2020-05-31 LAB — NOVEL CORONAVIRUS, NAA: SARS-CoV-2, NAA: DETECTED — AB

## 2020-06-01 ENCOUNTER — Telehealth: Payer: Self-pay | Admitting: Physician Assistant

## 2020-06-01 NOTE — Telephone Encounter (Signed)
Called to discuss with [name] about Covid symptoms and the use of casirivimab and imdevimab, a monoclonal antibody infusion for those with mild to moderate Covid symptoms and at a high risk of hospitalization.     Pt does not qualify for infusion therapy as he has asymptomatic infection. Isolation precautions discussed. Advised to contact back for consideration should he develop symptoms. Patient verbalized understanding.    Wife is in hospital with [facility].   Patient Active Problem List   Diagnosis Date Noted  . Black stools 03/06/2020  . Gastroesophageal reflux disease without esophagitis 03/06/2020  . Paget's disease of bone 10/23/2018  . Acute renal failure (ARF) ([place]) 06/11/2016  . Hyperglycemia 06/11/2016  . Deficiency anemia 12/09/2011  . Diabetes mellitus with stage 3 chronic kidney disease ([place]) 12/09/2011  . Renal insufficiency 12/09/2011     Leanor Kail, PA

## 2020-06-04 ENCOUNTER — Telehealth: Payer: Self-pay

## 2020-06-04 ENCOUNTER — Ambulatory Visit: Payer: Medicare Other

## 2020-06-04 ENCOUNTER — Encounter: Payer: Medicare Other | Admitting: Internal Medicine

## 2020-06-04 ENCOUNTER — Encounter (HOSPITAL_COMMUNITY): Payer: Medicare Other

## 2020-06-04 NOTE — Telephone Encounter (Signed)
The pt was notified that Dr. Baird Cancer said that the pt didn't need to be retested, that the pt needed to quarantine, keep his sugars under control, stay well hydrated and to let the office know if he has any trouble breathing.  The pt said that he would like to do the infusion for positive covid patients.

## 2020-06-05 ENCOUNTER — Telehealth: Payer: Self-pay | Admitting: Nurse Practitioner

## 2020-06-05 NOTE — Telephone Encounter (Signed)
COVID MAB Infusion Contact Note   Qualifiers: DM, HTN, Age, Tobacco Hx Symptoms: None Symptom Onset: None    Contact via telephone to discuss symptoms and evaluate for interest and qualifications for MAB Infusion treatment. Patient does not qualify for use of MAB infusion under the FDA Emergency Use Authorization due to a lack of symptomology with infection.   Patient reports that he is doing well and denies any symptoms at this time. He tells me that his wife is still in the hospital, but she appears to be improving from her symptoms.   Encouraged the patient to reach out to PCP if he begins to develop symptoms or feel bad.    Worthy Keeler, DNP, AGNP-c COVID-19 [facility] [phone]

## 2020-06-06 ENCOUNTER — Ambulatory Visit: Payer: Medicare Other | Admitting: Internal Medicine

## 2020-06-11 ENCOUNTER — Ambulatory Visit: Payer: Medicare Other | Admitting: Nurse Practitioner

## 2020-06-11 ENCOUNTER — Other Ambulatory Visit: Payer: Self-pay | Admitting: Internal Medicine

## 2020-06-12 ENCOUNTER — Ambulatory Visit: Payer: Medicare Other | Admitting: Podiatry

## 2020-06-18 ENCOUNTER — Encounter (HOSPITAL_COMMUNITY): Payer: Medicare Other

## 2020-06-20 ENCOUNTER — Ambulatory Visit: Payer: Medicare Other | Admitting: Internal Medicine

## 2020-06-26 ENCOUNTER — Ambulatory Visit: Payer: Medicare Other | Admitting: Internal Medicine

## 2020-06-26 ENCOUNTER — Other Ambulatory Visit: Payer: Self-pay | Admitting: Internal Medicine

## 2020-06-28 ENCOUNTER — Telehealth: Payer: Self-pay | Admitting: *Deleted

## 2020-06-28 NOTE — Chronic Care Management (AMB) (Signed)
  Care Management   Note  06/28/2020 Name: Alec Solis MRN: [id number] DOB: 1932-10-04  Alec Solis is a 86 y.o. year old male who is a primary care patient of [name], MD and is actively engaged with the care management team. I reached out to [name] by phone today to assist with re-scheduling a follow up visit with the Pharmacist  Follow up plan: Unsuccessful telephone outreach attempt made. A HIPAA compliant phone message was left for the patient providing contact information and requesting a return call.  The care management team will reach out to the patient again over the next 7 days.  If patient returns call to provider office, please advise to call [place] at [phone].  [facility] [facility]

## 2020-07-01 ENCOUNTER — Other Ambulatory Visit: Payer: Self-pay

## 2020-07-01 ENCOUNTER — Encounter: Payer: Self-pay | Admitting: Internal Medicine

## 2020-07-01 ENCOUNTER — Ambulatory Visit (INDEPENDENT_AMBULATORY_CARE_PROVIDER_SITE_OTHER): Payer: Medicare Other | Admitting: Internal Medicine

## 2020-07-01 VITALS — BP 118/64 | HR 84 | Temp 98.3°F | Ht 72.0 in | Wt 190.0 lb

## 2020-07-01 DIAGNOSIS — N183 Chronic kidney disease, stage 3 unspecified: Secondary | ICD-10-CM

## 2020-07-01 DIAGNOSIS — E1122 Type 2 diabetes mellitus with diabetic chronic kidney disease: Secondary | ICD-10-CM

## 2020-07-01 DIAGNOSIS — F4321 Adjustment disorder with depressed mood: Secondary | ICD-10-CM | POA: Diagnosis not present

## 2020-07-01 DIAGNOSIS — Z8616 Personal history of COVID-19: Secondary | ICD-10-CM

## 2020-07-01 DIAGNOSIS — I129 Hypertensive chronic kidney disease with stage 1 through stage 4 chronic kidney disease, or unspecified chronic kidney disease: Secondary | ICD-10-CM

## 2020-07-01 NOTE — Patient Instructions (Signed)
Diabetes Mellitus and Exercise Exercising regularly is important for your overall health, especially when you have diabetes (diabetes mellitus). Exercising is not only about losing weight. It has many other health benefits, such as increasing muscle strength and bone density and reducing body fat and stress. This leads to improved fitness, flexibility, and endurance, all of which result in better overall health. Exercise has additional benefits for people with diabetes, including:  Reducing appetite.  Helping to lower and control blood glucose.  Lowering blood pressure.  Helping to control amounts of fatty substances (lipids) in the blood, such as cholesterol and triglycerides.  Helping the body to respond better to insulin (improving insulin sensitivity).  Reducing how much insulin the body needs.  Decreasing the risk for heart disease by: ? Lowering cholesterol and triglyceride levels. ? Increasing the levels of good cholesterol. ? Lowering blood glucose levels. What is my activity plan? Your health care provider or certified diabetes educator can help you make a plan for the type and frequency of exercise (activity plan) that works for you. Make sure that you:  Do at least 150 minutes of moderate-intensity or vigorous-intensity exercise each week. This could be brisk walking, biking, or water aerobics. ? Do stretching and strength exercises, such as yoga or weightlifting, at least 2 times a week. ? Spread out your activity over at least 3 days of the week.  Get some form of physical activity every day. ? Do not go more than 2 days in a row without some kind of physical activity. ? Avoid being inactive for more than 30 minutes at a time. Take frequent breaks to walk or stretch.  Choose a type of exercise or activity that you enjoy, and set realistic goals.  Start slowly, and gradually increase the intensity of your exercise over time. What do I need to know about managing my  diabetes?   Check your blood glucose before and after exercising. ? If your blood glucose is 240 mg/dL (13.3 mmol/L) or higher before you exercise, check your urine for ketones. If you have ketones in your urine, do not exercise until your blood glucose returns to normal. ? If your blood glucose is 100 mg/dL (5.6 mmol/L) or lower, eat a snack containing 15-20 grams of carbohydrate. Check your blood glucose 15 minutes after the snack to make sure that your level is above 100 mg/dL (5.6 mmol/L) before you start your exercise.  Know the symptoms of low blood glucose (hypoglycemia) and how to treat it. Your risk for hypoglycemia increases during and after exercise. Common symptoms of hypoglycemia can include: ? Hunger. ? Anxiety. ? Sweating and feeling clammy. ? Confusion. ? Dizziness or feeling light-headed. ? Increased heart rate or palpitations. ? Blurry vision. ? Tingling or numbness around the mouth, lips, or tongue. ? Tremors or shakes. ? Irritability.  Keep a rapid-acting carbohydrate snack available before, during, and after exercise to help prevent or treat hypoglycemia.  Avoid injecting insulin into areas of the body that are going to be exercised. For example, avoid injecting insulin into: ? The arms, when playing tennis. ? The legs, when jogging.  Keep records of your exercise habits. Doing this can help you and your health care provider adjust your diabetes management plan as needed. Write down: ? Food that you eat before and after you exercise. ? Blood glucose levels before and after you exercise. ? The type and amount of exercise you have done. ? When your insulin is expected to peak, if you use   insulin. Avoid exercising at times when your insulin is peaking.  When you start a new exercise or activity, work with your health care provider to make sure the activity is safe for you, and to adjust your insulin, medicines, or food intake as needed.  Drink plenty of water while  you exercise to prevent dehydration or heat stroke. Drink enough fluid to keep your urine clear or pale yellow. Summary  Exercising regularly is important for your overall health, especially when you have diabetes (diabetes mellitus).  Exercising has many health benefits, such as increasing muscle strength and bone density and reducing body fat and stress.  Your health care provider or certified diabetes educator can help you make a plan for the type and frequency of exercise (activity plan) that works for you.  When you start a new exercise or activity, work with your health care provider to make sure the activity is safe for you, and to adjust your insulin, medicines, or food intake as needed. This information is not intended to replace advice given to you by your health care provider. Make sure you discuss any questions you have with your health care provider. Document Revised: 03/04/2017 Document Reviewed: 01/20/2016 Elsevier Patient Education  2020 [employer]  

## 2020-07-01 NOTE — Progress Notes (Addendum)
Rutherford Nail as a scribe for Maximino Greenland, MD.,have documented all relevant documentation on the behalf of Maximino Greenland, MD,as directed by  Maximino Greenland, MD while in the presence of Maximino Greenland, MD. This visit occurred during the SARS-CoV-2 public health emergency.  Safety protocols were in place, including screening questions prior to the visit, additional usage of staff PPE, and extensive cleaning of exam room while observing appropriate contact time as indicated for disinfecting solutions.  Subjective:     Patient ID: Alec Solis , male    DOB: 11-03-1932 , 86 y.o.   MRN: [id number]   Chief Complaint  Patient presents with  . Diabetes  . Hypertension    HPI  He presents today for diabetes/htn check. He feels well. Admits he is under a great deal of stress.  His wife recently came home, was hospitalized with COVID. He had COVID as well, but never had any symptoms. He was unable to get antibody infusion because of lack of symptoms.   Diabetes He presents for his follow-up diabetic visit. He has type 2 diabetes mellitus. Pertinent negatives for hypoglycemia include no dizziness or headaches. Pertinent negatives for diabetes include no blurred vision, no chest pain, no fatigue, no polydipsia, no polyphagia and no polyuria. There are no hypoglycemic complications. There are no diabetic complications. He participates in exercise intermittently. Eye exam is current.  Hypertension This is a chronic problem. The current episode started more than 1 year ago. The problem has been gradually improving since onset. The problem is controlled. Pertinent negatives include no blurred vision, chest pain, headaches, palpitations or shortness of breath. Risk factors for coronary artery disease include diabetes mellitus, dyslipidemia, sedentary lifestyle and male gender.     Past Medical History:  Diagnosis Date  . Anemia   . Anxiety   . Arthritis   . Cataract   . Chronic kidney  disease   . Depression   . Diabetes mellitus without complication ([place])    type II   . GERD (gastroesophageal reflux disease)   . Gout   . Heart murmur   . Hypertension   . Myocardial infarction ([place])    minor Mi- years ago -   . Varicose veins of left lower extremity      Family History  Problem Relation Age of Onset  . Breast cancer Mother   . Stomach cancer Mother   . Diabetes Mellitus II Maternal Aunt      Current Outpatient Medications:  .  acetaminophen (ACETAMINOPHEN 8 HOUR) 650 MG CR tablet, Take 650 mg by mouth every 8 (eight) hours as needed for pain. , Disp: , Rfl:  .  allopurinol (ZYLOPRIM) 100 MG tablet, TAKE 1 TABLET BY MOUTH TWICE DAILY, Disp: 180 tablet, Rfl: 0 .  amLODipine (NORVASC) 5 MG tablet, TAKE 1 TABLET BY MOUTH DAILY, Disp: 90 tablet, Rfl: 0 .  aspirin 81 MG tablet, Take 81 mg by mouth daily. , Disp: , Rfl:  .  B-D ULTRAFINE III SHORT PEN 31G X 8 MM MISC, USE AS DIRECTED FOUR TIMES DAILY, Disp: 100 each, Rfl: 3 .  calcitRIOL (ROCALTROL) 0.25 MCG capsule, Take 0.25 mcg by mouth every other day. , Disp: , Rfl:  .  carboxymethylcellulose (REFRESH PLUS) 0.5 % SOLN, Place 1 drop into both eyes 3 (three) times daily as needed (Watery eyes). , Disp: , Rfl:  .  carvedilol (COREG) 12.5 MG tablet, Take 12.5 mg by mouth 2 (two) times daily with a meal.,  Disp: , Rfl:  .  clopidogrel (PLAVIX) 75 MG tablet, TAKE 1 TABLET(75 MG) BY MOUTH DAILY (Patient taking differently: Take 75 mg by mouth daily. ), Disp: 90 tablet, Rfl: 2 .  ferrous sulfate (QC FERROUS SULFATE) 325 (65 FE) MG tablet, Take 325 mg by mouth daily with breakfast., Disp: , Rfl:  .  furosemide (LASIX) 40 MG tablet, Take 40 mg by mouth daily as needed for fluid or edema. , Disp: , Rfl:  .  glucose blood (TRUE METRIX BLOOD GLUCOSE TEST) test strip, Use as directed to check blood sugars 3 times per day dx: e11.65, Disp: 300 each, Rfl: 5 .  insulin degludec (TRESIBA FLEXTOUCH) 200 UNIT/ML FlexTouch Pen, Inject 22  Units into the skin daily., Disp: 9 pen, Rfl: 0 .  Insulin Pen Needle 32G X 8 MM MISC, Use as directed, Disp: 100 each, Rfl: 2 .  Misc Natural Products (MENS PROSTATE HEALTH FORMULA PO), Take 1 capsule by mouth daily. New Vitality Super Beta Prostate, Disp: , Rfl:  .  Multiple Vitamins-Minerals (MULTIVITAMIN ADULT PO), Take 1 tablet by mouth daily. [employer] Mega men enhancement, Disp: , Rfl:  .  Multiple Vitamins-Minerals (PRESERVISION AREDS 2 PO), Take 1 tablet by mouth daily., Disp: , Rfl:  .  naproxen sodium (ALEVE) 220 MG tablet, Take 220 mg by mouth daily as needed (Body ache)., Disp: , Rfl:  .  Omega-3 Fatty Acids (FISH OIL OMEGA-3 PO), Take 1,200 mg by mouth daily. , Disp: , Rfl:  .  omeprazole (PRILOSEC) 40 MG capsule, TAKE 1 CAPSULE BY MOUTH EVERY DAY BEFORE A MEAL, Disp: 90 capsule, Rfl: 1 .  rosuvastatin (CRESTOR) 20 MG tablet, Take 1 tablet (20 mg total) by mouth See admin instructions. TAKE 20 mg TABLET BY MOUTH MONDAY-FRIDAY, Disp: 75 tablet, Rfl: 1 .  sodium bicarbonate 650 MG tablet, Take 1,300 mg by mouth 2 (two) times daily., Disp: , Rfl:  .  TRUEplus Lancets 30G MISC, USE AS DIRECTED TO TEST BLOOD SUGAR THREE TIMES DAILY, Disp: 300 each, Rfl: 11 .  vitamin B-12 (CYANOCOBALAMIN) 1000 MCG tablet, Take 1,000 mcg by mouth daily., Disp: , Rfl:    No Known Allergies   Review of Systems  Constitutional: Negative.  Negative for fatigue.  HENT: Negative.   Eyes: Negative for blurred vision.  Respiratory: Negative for shortness of breath.   Cardiovascular: Negative.  Negative for chest pain and palpitations.  Endocrine: Negative for polydipsia, polyphagia and polyuria.  Musculoskeletal: Negative.   Skin: Negative.   Neurological: Negative for dizziness and headaches.  Psychiatric/Behavioral: Positive for dysphoric mood.     Today's Vitals   07/01/20 1419  BP: 118/64  Pulse: 84  Temp: 98.3 F (36.8 C)  TempSrc: Oral  Weight: 190 lb (86.2 kg)  Height: 6' (1.829 m)  PainSc: 0-No  pain   Body mass index is 25.77 kg/m.   Objective:  Physical Exam Vitals and nursing note reviewed.  Constitutional:      Appearance: Normal appearance.  Cardiovascular:     Rate and Rhythm: Normal rate and regular rhythm.     Heart sounds: Normal heart sounds.  Pulmonary:     Effort: Pulmonary effort is normal.     Breath sounds: Normal breath sounds.  Skin:    General: Skin is warm.  Neurological:     General: No focal deficit present.     Mental Status: He is alert.  Psychiatric:        Mood and Affect: Mood normal.  Assessment And Plan:     1. Diabetes mellitus with stage 3 chronic kidney disease ([place]) Comments: Chronic, I will check labs as listed below. I will adjust meds as needed.  - BMP8+EGFR - Hemoglobin A1c  2. Hypertensive nephropathy Comments: Chronic, well controlled. He will continue with current meds.  He is encouraged to avoid adding salt to his foods.   3. Adjustment disorder with depressed mood Comments: His mood is related to his wife's illness. He does not wish to accept treatment for this. He is advised to let me know if he changes his mind.   4. Personal history of COVID-19     Patient was given opportunity to ask questions. Patient verbalized understanding of the plan and was able to repeat key elements of the plan. All questions were answered to their satisfaction.  Maximino Greenland, MD   I, Maximino Greenland, MD, have reviewed all documentation for this visit. The documentation on 07/01/20 for the exam, diagnosis, procedures, and orders are all accurate and complete.  THE PATIENT IS ENCOURAGED TO PRACTICE SOCIAL DISTANCING DUE TO THE COVID-19 PANDEMIC.

## 2020-07-02 LAB — HEMOGLOBIN A1C
Est. average glucose Bld gHb Est-mCnc: 157 mg/dL
Hgb A1c MFr Bld: 7.1 % — ABNORMAL HIGH (ref 4.8–5.6)

## 2020-07-02 LAB — BMP8+EGFR
BUN/Creatinine Ratio: 16 (ref 10–24)
BUN: 36 mg/dL — ABNORMAL HIGH (ref 8–27)
CO2: 18 mmol/L — ABNORMAL LOW (ref 20–29)
Calcium: 9.6 mg/dL (ref 8.6–10.2)
Chloride: 106 mmol/L (ref 96–106)
Creatinine, Ser: 2.22 mg/dL — ABNORMAL HIGH (ref 0.76–1.27)
GFR calc Af Amer: 30 mL/min/{1.73_m2} — ABNORMAL LOW (ref >59)
GFR calc non Af Amer: 26 mL/min/{1.73_m2} — ABNORMAL LOW (ref >59)
Glucose: 152 mg/dL — ABNORMAL HIGH (ref 65–99)
Potassium: 5.2 mmol/L (ref 3.5–5.2)
Sodium: 141 mmol/L (ref 134–144)

## 2020-07-10 ENCOUNTER — Telehealth: Payer: Self-pay

## 2020-07-12 ENCOUNTER — Other Ambulatory Visit: Payer: Self-pay

## 2020-07-12 ENCOUNTER — Encounter: Payer: Self-pay | Admitting: Podiatry

## 2020-07-12 ENCOUNTER — Ambulatory Visit (INDEPENDENT_AMBULATORY_CARE_PROVIDER_SITE_OTHER): Payer: Medicare Other | Admitting: Podiatry

## 2020-07-12 DIAGNOSIS — M79674 Pain in right toe(s): Secondary | ICD-10-CM

## 2020-07-12 DIAGNOSIS — E1122 Type 2 diabetes mellitus with diabetic chronic kidney disease: Secondary | ICD-10-CM | POA: Diagnosis not present

## 2020-07-12 DIAGNOSIS — L84 Corns and callosities: Secondary | ICD-10-CM

## 2020-07-12 DIAGNOSIS — M79675 Pain in left toe(s): Secondary | ICD-10-CM | POA: Diagnosis not present

## 2020-07-12 DIAGNOSIS — B351 Tinea unguium: Secondary | ICD-10-CM

## 2020-07-12 DIAGNOSIS — N183 Chronic kidney disease, stage 3 unspecified: Secondary | ICD-10-CM | POA: Diagnosis not present

## 2020-07-12 DIAGNOSIS — M2012 Hallux valgus (acquired), left foot: Secondary | ICD-10-CM

## 2020-07-12 DIAGNOSIS — Q828 Other specified congenital malformations of skin: Secondary | ICD-10-CM

## 2020-07-12 DIAGNOSIS — M2011 Hallux valgus (acquired), right foot: Secondary | ICD-10-CM

## 2020-07-14 NOTE — Progress Notes (Signed)
Subjective: Alec Solis presents today for for annual diabetic foot examination, at risk foot care. Pt has h/o NIDDM with chronic kidney disease and callus(es) left foot and painful mycotic toenails b/l that are difficult to trim. Pain interferes with ambulation. Aggravating factors include wearing enclosed shoe gear. Pain is relieved with periodic professional debridement.  Sadly, he states his wife is dying.  He denies any new pedal concerns on today's visit.   PCP is Dr. [name] and last visit was 04/12/2020.  Past Medical History:  Diagnosis Date  . Anemia   . Anxiety   . Arthritis   . Cataract   . Chronic kidney disease   . Depression   . Diabetes mellitus without complication ([place])    type II   . GERD (gastroesophageal reflux disease)   . Gout   . Heart murmur   . Hypertension   . Myocardial infarction ([place])    minor Mi- years ago -   . Varicose veins of left lower extremity     Patient Active Problem List   Diagnosis Date Noted  . Black stools 03/06/2020  . Gastroesophageal reflux disease without esophagitis 03/06/2020  . Paget's disease of bone 10/23/2018  . Acute renal failure (ARF) ([place]) 06/11/2016  . Hyperglycemia 06/11/2016  . Deficiency anemia 12/09/2011  . Diabetes mellitus with stage 3 chronic kidney disease ([place]) 12/09/2011  . Renal insufficiency 12/09/2011    Past Surgical History:  Procedure Laterality Date  . BUNIONECTOMY    . cataract    . COLONOSCOPY WITH PROPOFOL N/A 05/28/2017   Procedure: COLONOSCOPY WITH PROPOFOL;  Surgeon: Carol Ada, MD;  Location: [facility];  Service: Endoscopy;  Laterality: N/A;  . COLONOSCOPY WITH PROPOFOL N/A 03/29/2020   Procedure: COLONOSCOPY WITH PROPOFOL;  Surgeon: Carol Ada, MD;  Location: [facility];  Service: Endoscopy;  Laterality: N/A;  . ENTEROSCOPY N/A 03/29/2020   Procedure: ENTEROSCOPY;  Surgeon: Carol Ada, MD;  Location: [facility];  Service: Endoscopy;  Laterality: N/A;  .  ESOPHAGOGASTRODUODENOSCOPY (EGD) WITH PROPOFOL N/A 05/28/2017   Procedure: ESOPHAGOGASTRODUODENOSCOPY (EGD) WITH PROPOFOL;  Surgeon: Carol Ada, MD;  Location: [facility];  Service: Endoscopy;  Laterality: N/A;  . HEMOSTASIS CLIP PLACEMENT  03/29/2020   Procedure: HEMOSTASIS CLIP PLACEMENT;  Surgeon: Carol Ada, MD;  Location: [facility];  Service: Endoscopy;;  . HOT HEMOSTASIS N/A 03/29/2020   Procedure: HOT HEMOSTASIS (ARGON PLASMA COAGULATION/BICAP);  Surgeon: Carol Ada, MD;  Location: [facility];  Service: Endoscopy;  Laterality: N/A;  . POLYPECTOMY  03/29/2020   Procedure: POLYPECTOMY;  Surgeon: Carol Ada, MD;  Location: [facility];  Service: Endoscopy;;    Current Outpatient Medications on File Prior to Visit  Medication Sig Dispense Refill  . acetaminophen (ACETAMINOPHEN 8 HOUR) 650 MG CR tablet Take 650 mg by mouth every 8 (eight) hours as needed for pain.     Marland Kitchen allopurinol (ZYLOPRIM) 100 MG tablet TAKE 1 TABLET BY MOUTH TWICE DAILY 180 tablet 0  . amLODipine (NORVASC) 5 MG tablet TAKE 1 TABLET BY MOUTH DAILY 90 tablet 0  . aspirin 81 MG tablet Take 81 mg by mouth daily.     . B-D ULTRAFINE III SHORT PEN 31G X 8 MM MISC USE AS DIRECTED FOUR TIMES DAILY 100 each 3  . calcitRIOL (ROCALTROL) 0.25 MCG capsule Take 0.25 mcg by mouth every other day.     . carboxymethylcellulose (REFRESH PLUS) 0.5 % SOLN Place 1 drop into both eyes 3 (three) times daily as needed (Watery eyes).     Marland Kitchen  carvedilol (COREG) 12.5 MG tablet Take 12.5 mg by mouth 2 (two) times daily with a meal.    . clopidogrel (PLAVIX) 75 MG tablet TAKE 1 TABLET(75 MG) BY MOUTH DAILY (Patient taking differently: Take 75 mg by mouth daily. ) 90 tablet 2  . ferrous sulfate (QC FERROUS SULFATE) 325 (65 FE) MG tablet Take 325 mg by mouth daily with breakfast.    . furosemide (LASIX) 40 MG tablet Take 40 mg by mouth daily as needed for fluid or edema.     Marland Kitchen glucose blood (TRUE METRIX BLOOD GLUCOSE TEST) test strip Use as  directed to check blood sugars 3 times per day dx: e11.65 300 each 5  . insulin degludec (TRESIBA FLEXTOUCH) 200 UNIT/ML FlexTouch Pen Inject 22 Units into the skin daily. 9 pen 0  . Insulin Pen Needle 32G X 8 MM MISC Use as directed 100 each 2  . Misc Natural Products (MENS PROSTATE HEALTH FORMULA PO) Take 1 capsule by mouth daily. New Vitality Super Beta Prostate    . Multiple Vitamins-Minerals (MULTIVITAMIN ADULT PO) Take 1 tablet by mouth daily. [place] Mega men enhancement    . Multiple Vitamins-Minerals (PRESERVISION AREDS 2 PO) Take 1 tablet by mouth daily.    . naproxen sodium (ALEVE) 220 MG tablet Take 220 mg by mouth daily as needed (Body ache).    . Omega-3 Fatty Acids (FISH OIL OMEGA-3 PO) Take 1,200 mg by mouth daily.     Marland Kitchen omeprazole (PRILOSEC) 40 MG capsule TAKE 1 CAPSULE BY MOUTH EVERY DAY BEFORE A MEAL 90 capsule 1  . rosuvastatin (CRESTOR) 20 MG tablet Take 1 tablet (20 mg total) by mouth See admin instructions. TAKE 20 mg TABLET BY MOUTH MONDAY-FRIDAY 75 tablet 1  . sodium bicarbonate 650 MG tablet Take 1,300 mg by mouth 2 (two) times daily.    . TRUEplus Lancets 30G MISC USE AS DIRECTED TO TEST BLOOD SUGAR THREE TIMES DAILY 300 each 11  . vitamin B-12 (CYANOCOBALAMIN) 1000 MCG tablet Take 1,000 mcg by mouth daily.     No current facility-administered medications on file prior to visit.     No Known Allergies  Social History   Occupational History  . Occupation: retired  Tobacco Use  . Smoking status: Former Smoker    Packs/day: 0.25    Years: 50.00    Pack years: 12.50    Types: Cigarettes  . Smokeless tobacco: Never Used  Vaping Use  . Vaping Use: Never used  Substance and Sexual Activity  . Alcohol use: Yes    Comment: occassional wine beer  . Drug use: No  . Sexual activity: Not Currently    Family History  Problem Relation Age of Onset  . Breast cancer Mother   . Stomach cancer Mother   . Diabetes Mellitus II Maternal Aunt     Immunization History   Administered Date(s) Administered  . Influenza, High Dose Seasonal PF 05/25/2018, 04/19/2019  . Influenza-Unspecified 05/24/2013, 05/31/2018  . PFIZER SARS-COV-2 Vaccination 10/23/2019, 11/15/2019  . Pneumococcal Polysaccharide-23 05/24/2013, 05/24/2017     Objective: There were no vitals filed for this visit.  Martavis Gurney is a/an 86 y.o. African American  male, WD, WN in NAD. AAO X 3.  Vascular Examination: Capillary refill time to digits immediate b/l. Palpable DP pulses b/l. Palpable PT pulses b/l. Pedal hair absent b/l Skin temperature gradient within normal limits b/l.  Dermatological Examination: Pedal skin is thin shiny, atrophic b/l lower extremities. No open wounds bilaterally. No interdigital macerations  bilaterally. Toenails 1-5 b/l elongated, discolored, dystrophic, thickened, crumbly with subungual debris and tenderness to dorsal palpation. Hyperkeratotic lesion(s) L hallux, L 5th toe, R hallux and submet head 3 left foot.  No erythema, no edema, no drainage, no flocculence. Porokeratotic lesion(s) submet head 3 left foot. No erythema, no edema, no drainage, no flocculence.  Musculoskeletal Examination: Normal muscle strength 5/5 to all lower extremity muscle groups bilaterally, no pain crepitus or joint limitation noted with ROM b/l, bunion deformity noted b/l and hammertoes noted to the  2-5 bilaterally.  Neurological Examination: Protective sensation intact 5/5 intact bilaterally with 10g monofilament b/l. Vibratory sensation intact b/l. Clonus negative b/l.  Hemoglobin A1C Latest Ref Rng & Units 07/01/2020 02/20/2020 11/14/2019 08/07/2019  HGBA1C 4.8 - 5.6 % 7.1(H) 6.0(H) 6.4(H) 7.8(H)  Some recent data might be hidden   Assessment: 1. Pain due to onychomycosis of toenails of both feet   2. Corns and callosities   3. Porokeratosis   4. Hallux valgus, acquired, bilateral   5. Diabetes mellitus with stage 3 chronic kidney disease ([place])     Plan: -Examined  patient. -No new findings. No new orders. -Continue diabetic foot care principles. -Toenails 1-5 b/l were debrided in length and girth with sterile nail nippers and dremel without iatrogenic bleeding.  -Corn(s) L 5th toe and callus(es) L hallux and R hallux were pared utilizing sterile scalpel blade without incident. Total number debrided =3. -Painful porokeratotic lesion(s) submet head 3 left foot pared and enucleated with sterile scalpel blade without incident. -Patient to report any pedal injuries to medical professional immediately.  Return in about 3 months (around 10/12/2020) for diabetic foot care.

## 2020-07-17 NOTE — Chronic Care Management (AMB) (Signed)
  Care Management   Note  07/17/2020 Name: Matson Welch MRN: [id number] DOB: 1933-08-16  Demorris Choyce is a 86 y.o. year old male who is a primary care patient of [name], MD and is actively engaged with the care management team. I reached out to [name] by phone today to assist with re-scheduling a follow up visit with the Pharmacist  Follow up plan: Telephone appointment with care management team member scheduled for:09/05/2019  [place] [facility]

## 2020-07-31 ENCOUNTER — Telehealth: Payer: Self-pay

## 2020-07-31 NOTE — Telephone Encounter (Signed)
This nurse attempted to call patient due to missing today's appointment. There was no answer and there was no voicemail to leave a message.

## 2020-08-28 ENCOUNTER — Telehealth: Payer: Self-pay

## 2020-08-28 NOTE — Telephone Encounter (Signed)
This nurse attempted to call patient for missed AWV appointment. Message left for patient to call back to reschedule.

## 2020-09-03 ENCOUNTER — Telehealth: Payer: Self-pay

## 2020-09-03 NOTE — Chronic Care Management (AMB) (Signed)
09/03/2020 Called patient to remind him of his appointment with Vallie Pearson,CPP on 09/04/2020 @ 10:00.  Patient is aware to have all medications and supplements available at time of appointment.  Patient's home number has been changed . New number [phone].

## 2020-09-04 ENCOUNTER — Ambulatory Visit: Payer: Medicare Other

## 2020-09-04 ENCOUNTER — Telehealth: Payer: Self-pay

## 2020-09-04 DIAGNOSIS — E1122 Type 2 diabetes mellitus with diabetic chronic kidney disease: Secondary | ICD-10-CM

## 2020-09-04 DIAGNOSIS — I129 Hypertensive chronic kidney disease with stage 1 through stage 4 chronic kidney disease, or unspecified chronic kidney disease: Secondary | ICD-10-CM

## 2020-09-04 DIAGNOSIS — N183 Chronic kidney disease, stage 3 unspecified: Secondary | ICD-10-CM

## 2020-09-04 NOTE — Chronic Care Management (AMB) (Signed)
Chronic Care Management Pharmacy Assistant   Name: Alec Solis  MRN: [id number] DOB: 05/22/1933  Reason for Encounter: Patient Assistance Coordination  PCP : [name], MD   09/04/2020- Patient assistance form filled out for renewal on Tresiba with [employer]. Patient also inquiring if samples are available for [place]. Patient informed by Orlando Penner, CPP that we will check with PCP office for samples.   09/05/2020- PCP office does not have any samples of Tresiba at this time. Patient will be updated.   09/13/2020- Left message for patient to return call regarding samples and patient assistance forms. Patient called back, he is aware of forms needing to be signed and income documentation needed. He was able to get a sample of Tresiba from office on 09/12/2020. Patient will come to the office on 09/16/2020 to sign. Orlando Penner, CPP notified.   Allergies:  No Known Allergies  Medications: Outpatient Encounter Medications as of 09/04/2020  Medication Sig   acetaminophen (TYLENOL) 650 MG CR tablet Take 650 mg by mouth every 8 (eight) hours as needed for pain.    allopurinol (ZYLOPRIM) 100 MG tablet TAKE 1 TABLET BY MOUTH TWICE DAILY   amLODipine (NORVASC) 5 MG tablet TAKE 1 TABLET BY MOUTH DAILY   aspirin 81 MG tablet Take 81 mg by mouth daily.    B-D ULTRAFINE III SHORT PEN 31G X 8 MM MISC USE AS DIRECTED FOUR TIMES DAILY   calcitRIOL (ROCALTROL) 0.25 MCG capsule Take 0.25 mcg by mouth every other day.    carboxymethylcellulose (REFRESH PLUS) 0.5 % SOLN Place 1 drop into both eyes 3 (three) times daily as needed (Watery eyes).    carvedilol (COREG) 12.5 MG tablet Take 12.5 mg by mouth 2 (two) times daily with a meal.   clopidogrel (PLAVIX) 75 MG tablet TAKE 1 TABLET(75 MG) BY MOUTH DAILY (Patient taking differently: Take 75 mg by mouth daily.)   ferrous sulfate 325 (65 FE) MG tablet Take 325 mg by mouth daily with breakfast.   furosemide (LASIX) 40 MG tablet Take  40 mg by mouth daily as needed for fluid or edema.    glucose blood (TRUE METRIX BLOOD GLUCOSE TEST) test strip Use as directed to check blood sugars 3 times per day dx: e11.65   insulin degludec (TRESIBA FLEXTOUCH) 200 UNIT/ML FlexTouch Pen Inject 22 Units into the skin daily.   Insulin Pen Needle 32G X 8 MM MISC Use as directed   Misc Natural Products (MENS PROSTATE HEALTH FORMULA PO) Take 1 capsule by mouth daily. New Vitality Super Beta Prostate   Multiple Vitamins-Minerals (MULTIVITAMIN ADULT PO) Take 1 tablet by mouth daily. [employer] Mega men enhancement   Multiple Vitamins-Minerals (PRESERVISION AREDS 2 PO) Take 1 tablet by mouth in the morning and at bedtime.   naproxen sodium (ALEVE) 220 MG tablet Take 220 mg by mouth daily as needed (Body ache).   Omega-3 Fatty Acids (FISH OIL OMEGA-3 PO) Take 1,200 mg by mouth daily.    omeprazole (PRILOSEC) 40 MG capsule TAKE 1 CAPSULE BY MOUTH EVERY DAY BEFORE A MEAL   rosuvastatin (CRESTOR) 20 MG tablet Take 1 tablet (20 mg total) by mouth See admin instructions. TAKE 20 mg TABLET BY MOUTH MONDAY-FRIDAY   sodium bicarbonate 650 MG tablet Take 1,300 mg by mouth 2 (two) times daily.   TRUEplus Lancets 30G MISC USE AS DIRECTED TO TEST BLOOD SUGAR THREE TIMES DAILY   vitamin B-12 (CYANOCOBALAMIN) 1000 MCG tablet Take 1,000 mcg by mouth daily.   No  facility-administered encounter medications on file as of 09/04/2020.    Current Diagnosis: Patient Active Problem List   Diagnosis Date Noted   Black stools 03/06/2020   Gastroesophageal reflux disease without esophagitis 03/06/2020   Paget's disease of bone 10/23/2018   Acute renal failure (ARF) ([place]) 06/11/2016   Hyperglycemia 06/11/2016   Deficiency anemia 12/09/2011   Diabetes mellitus with stage 3 chronic kidney disease ([place]) 12/09/2011   Renal insufficiency 12/09/2011    Follow-Up:  Patient Alec Solis, [place] Pharmacist  Assistant [phone]

## 2020-09-04 NOTE — Patient Instructions (Signed)
Visit Information  Goals Addressed            This Visit's Progress   . Pharmacy Care Plan       CARE PLAN ENTRY (see longitudinal plan of care for additional care plan information)  Current Barriers:  . Chronic Disease Management support, education, and care coordination needs related to Hypertension, Hyperlipidemia, Diabetes, and GERD   Hypertension BP Readings from Last 3 Encounters:  07/01/20 118/64  05/30/20 120/74  05/21/20 (!) 157/86   . Pharmacist Clinical Goal(s): o Over the next 90 days, patient will work with PharmD and providers to maintain BP goal <130/80 . Current regimen:  o Amlodipine 5mg  daily o Carvedilol 12.5mg  twice daily . Interventions: o Provided dietary and exercise recommendations o Discussed appropriate goal for blood pressure (less than 130/80) o Advised patient that he can bring blood pressure monitor from home to next office visit to check it against BP monitor in office to ensure it is accurate . Patient self care activities - Over the next 90 days, patient will: o Check BP once weekly and if symptomatic, document, and provide at future appointments o Ensure daily salt intake < 2300 mg/day o Try to exercise for 30 minutes 5 times weekly o Silver sneakers on youtube  Hyperlipidemia Lab Results  Component Value Date/Time   LDLCALC 38 06/01/2019 02:16 PM   . Pharmacist Clinical Goal(s): o Over the next 180 days, patient will work with PharmD and providers to maintain LDL goal < 70 . Current regimen:  o Rosuvastatin 20mg  daily Monday through Friday  . Interventions: o Provided dietary and exercise recommendations o Discussed appropriate goals for HDL (greater than 40) and triglycerides (less than 150) o Provided dietary information regarding heathy fats . Patient self care activities - Over the next 180 days, patient will: o Try to exercise for 30 minutes 5 times weekly o Increase intake of healthy fats (fish, walnuts, avocados, flaxseed,  etc)  Diabetes Lab Results  Component Value Date/Time   HGBA1C 6.0 (H) 02/20/2020 11:42 AM   HGBA1C 6.4 (H) 11/14/2019 04:02 PM   . Pharmacist Clinical Goal(s): o Over the next 180 days, patient will work with PharmD and providers to maintain A1c goal <7% . Current regimen:  o Tresiba 22 units daily . Interventions: o Provided dietary and exercise recommendations o Reviewed and discussed recent blood glucose readings in depth o Recommend patient increase water intake to 64 ounces daily o No changes recommended to insulin regimen at this time . Patient self care activities - Over the next 180 days, patient will: o Check blood sugar twice daily, document, and provide at future appointments o Contact provider with any episodes of hypoglycemia o Try to exercise for 30 minutes 5 times weekly o Work on patient assistance documentation for Tresiba   GERD . Pharmacist Clinical Goal(s) o Over the next 180 days, patient will work with PharmD and providers to manage symptoms of reflux . Current regimen:  o Omeprazole 40mg  daily . Interventions: o Determined patient went back to taking omeprazole after finishing Dexilant samples o Discussed patient restarting omeprazole with PCP and PCP okay with that . Patient self care activities - Over the next 180 days, patient will: o Continue omeprazole o Notify PCP if symptoms worsen  Medication management . Pharmacist Clinical Goal(s): o Over the next 180 days, patient will work with PharmD and providers to maintain optimal medication adherence . Current pharmacy: [facility] . Interventions o Comprehensive medication review performed. o Continue current  medication management strategy o Perform cost comparison to determine if patient can use [facility] with current insurance o Collaborate with Dr. Benson Norway about allopurinol prescription being removed from medication list on 8/6. Notify patient if medication needs to be continued o Discuss  indication for clopidogrel at follow up visit . Patient self care activities - Over the next 180 days, patient will: o Focus on medication adherence by considering use of pill box or adherence packaging o Take medications as prescribed o Report any questions or concerns to PharmD and/or provider(s)  Please see past updates related to this goal by clicking on the "Past Updates" button in the selected goal         The patient verbalized understanding of instructions, educational materials, and care plan provided today and agreed to receive a mailed copy of patient instructions, educational materials, and care plan.   The pharmacy team will reach out to the patient again over the next 30 days.    Orlando Penner, PharmD Clinical Pharmacist [facility] [phone]

## 2020-09-04 NOTE — Chronic Care Management (AMB) (Signed)
Chronic Care Management Pharmacy  Name: Alec Solis  MRN: [id number] DOB: June 07, 1933  Chief Complaint/ HPI  Alec Solis,  87 y.o. , male presents for their Follow-Up CCM visit with the clinical pharmacist via telephone due to COVID-19 Pandemic. Patient reports that his wife passed on Aug 04, 2020. His wife was vaccinated and caught COVID-19 and passed away. He eats one full meal a day, he enjoys eating breakfast but he also tries to make dinner. He reports that his wife did a lot of the cooking so he is adjusting to that. He reports that sometimes he wakes up at night and watches TV and goes back to sleep. They were married for 27 years and it was his second marriage. His daughter is in [place] and he recently was there visiting his daughter. He spent the new years with her and her husband. He is adjusting slowly and he is going to get dressed and go out today and pay some bills. He reports that his neighbors check in on him. He is the one that takes care of most his medications. He has support from his friends and family and pastor [place].  We reviewed the patients social determinant of health depression due to the recent passing of his wife in November 2021.  PCP : Alec Chard, MD   Their chronic conditions include: Hypertension, Hyperlipidemia, Diabetes, GERD and Chronic Kidney Disease  Office Visits: 03/18/20 Telephone call: Pt notified sample for Tresiba ready for pick up  02/20/20 OV: DM follow up. BG has been fine, but pt still having abdominal discomfort. HgbA1c down to 6.0%. Referred to Gastroenterology for abdominal discomfort and black stools. .   Consult Visits: 05/07/20 Epoetin alfa injection  04/23/20 Epoetin alfa injection  04/12/20 Podiatry OV w/ Dr. Elisha Ponder: Diabetic foot exam performed. Continue diabetic foot care principles. Toenails 1-5 bilateral debrided. Corns and calluses were pared.   03/29/20 Colonoscopy: Noted multiple adenomas and recommended to have 3  year follow up colonoscopy. 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   03/26/20 Epoetin alfa injection  03/12/20 Epoetin alfa injection  02/27/20 Epoetin alfa injection  02/13/20 Epoetin alfa injection  01/30/20 Epoetin alfa injection  01/16/20 Epoetin alfa injection  01/02/20 Epoetin alfa injection  12/19/19 Epoeitin alfa injection  12/12/19 Podiatry OV w/ Dr. Elisha Ponder: Annual diabetic foot exam and at risk foot care. Toenails 1-5 bilateral debrided. Calluses debrided. Return in 3 months.   12/05/19 Epoetin alfa injection  11/21/19: Epoeitin alfa injection  11/07/19: Epoetin alfa 1000 units/ml injection  CCM Encounters:  Medications: Outpatient Encounter Medications as of 09/04/2020  Medication Sig  . acetaminophen (TYLENOL) 650 MG CR tablet Take 650 mg by mouth every 8 (eight) hours as needed for pain.   Marland Kitchen allopurinol (ZYLOPRIM) 100 MG tablet TAKE 1 TABLET BY MOUTH TWICE DAILY  . amLODipine (NORVASC) 5 MG tablet TAKE 1 TABLET BY MOUTH DAILY  . aspirin 81 MG tablet Take 81 mg by mouth daily.   . calcitRIOL (ROCALTROL) 0.25 MCG capsule Take 0.25 mcg by mouth every other day.   . carboxymethylcellulose (REFRESH PLUS) 0.5 % SOLN Place 1 drop into both eyes 3 (three) times daily as needed (Watery eyes).   . carvedilol (COREG) 12.5 MG tablet Take 12.5 mg by mouth 2 (two) times daily with a meal.  . clopidogrel (PLAVIX) 75 MG tablet TAKE 1 TABLET(75 MG) BY MOUTH DAILY (Patient taking differently: Take 75 mg by mouth daily.)  . ferrous sulfate 325 (65 FE)  MG tablet Take 325 mg by mouth daily with breakfast.  . furosemide (LASIX) 40 MG tablet Take 40 mg by mouth daily as needed for fluid or edema.   Marland Kitchen glucose blood (TRUE METRIX BLOOD GLUCOSE TEST) test strip Use as directed to check blood sugars 3 times per day dx: e11.65  . insulin degludec (TRESIBA FLEXTOUCH) 200 UNIT/ML FlexTouch Pen Inject 22 Units into the skin daily.  . Multiple Vitamins-Minerals  (MULTIVITAMIN ADULT PO) Take 1 tablet by mouth daily. [place] Mega men enhancement  . Multiple Vitamins-Minerals (PRESERVISION AREDS 2 PO) Take 1 tablet by mouth in the morning and at bedtime.  . naproxen sodium (ALEVE) 220 MG tablet Take 220 mg by mouth daily as needed (Body ache).  . Omega-3 Fatty Acids (FISH OIL OMEGA-3 PO) Take 1,200 mg by mouth daily.   Marland Kitchen omeprazole (PRILOSEC) 40 MG capsule TAKE 1 CAPSULE BY MOUTH EVERY DAY BEFORE A MEAL  . rosuvastatin (CRESTOR) 20 MG tablet Take 1 tablet (20 mg total) by mouth See admin instructions. TAKE 20 mg TABLET BY MOUTH MONDAY-FRIDAY  . sodium bicarbonate 650 MG tablet Take 1,300 mg by mouth 2 (two) times daily.  . TRUEplus Lancets 30G MISC USE AS DIRECTED TO TEST BLOOD SUGAR THREE TIMES DAILY  . vitamin B-12 (CYANOCOBALAMIN) 1000 MCG tablet Take 1,000 mcg by mouth daily.  . B-D ULTRAFINE III SHORT PEN 31G X 8 MM MISC USE AS DIRECTED FOUR TIMES DAILY  . Insulin Pen Needle 32G X 8 MM MISC Use as directed  . Misc Natural Products (MENS PROSTATE HEALTH FORMULA PO) Take 1 capsule by mouth daily. New Vitality Super Beta Prostate   No facility-administered encounter medications on file as of 09/04/2020.    Current Diagnosis/Assessment:  SDOH Interventions   Flowsheet Row Most Recent Value  SDOH Interventions   Depression Interventions/Treatment  --  Aletha Halim query this for now]      Goals Addressed            This Visit's Progress   . Pharmacy Care Plan       CARE PLAN ENTRY (see longitudinal plan of care for additional care plan information)  Current Barriers:  . Chronic Disease Management support, education, and care coordination needs related to Hypertension, Hyperlipidemia, Diabetes, and GERD   Hypertension BP Readings from Last 3 Encounters:  07/01/20 118/64  05/30/20 120/74  05/21/20 (!) 157/86   . Pharmacist Clinical Goal(s): o Over the next 90 days, patient will work with PharmD and providers to maintain BP goal <130/80 . Current  regimen:  o Amlodipine 67m daily o Carvedilol 12.511mtwice daily . Interventions: o Provided dietary and exercise recommendations o Discussed appropriate goal for blood pressure (less than 130/80) o Advised patient that he can bring blood pressure monitor from home to next office visit to check it against BP monitor in office to ensure it is accurate . Patient self care activities - Over the next 90 days, patient will: o Check BP once weekly and if symptomatic, document, and provide at future appointments o Ensure daily salt intake < 2300 mg/day o Try to exercise for 30 minutes 5 times weekly o Silver sneakers on youtube  Hyperlipidemia Lab Results  Component Value Date/Time   LDLCALC 38 06/01/2019 02:16 PM   . Pharmacist Clinical Goal(s): o Over the next 180 days, patient will work with PharmD and providers to maintain LDL goal < 70 . Current regimen:  o Rosuvastatin 204maily Monday through Friday  . Interventions: o Provided  dietary and exercise recommendations o Discussed appropriate goals for HDL (greater than 40) and triglycerides (less than 150) o Provided dietary information regarding heathy fats . Patient self care activities - Over the next 180 days, patient will: o Try to exercise for 30 minutes 5 times weekly o Increase intake of healthy fats (fish, walnuts, avocados, flaxseed, etc)  Diabetes Lab Results  Component Value Date/Time   HGBA1C 6.0 (H) 02/20/2020 11:42 AM   HGBA1C 6.4 (H) 11/14/2019 04:02 PM   . Pharmacist Clinical Goal(s): o Over the next 180 days, patient will work with PharmD and providers to maintain A1c goal <7% . Current regimen:  o Tresiba 22 units daily . Interventions: o Provided dietary and exercise recommendations o Reviewed and discussed recent blood glucose readings in depth o Recommend patient increase water intake to 64 ounces daily o No changes recommended to insulin regimen at this time . Patient self care activities - Over the  next 180 days, patient will: o Check blood sugar twice daily, document, and provide at future appointments o Contact provider with any episodes of hypoglycemia o Try to exercise for 30 minutes 5 times weekly o Work on patient assistance documentation for Tresiba   GERD . Pharmacist Clinical Goal(s) o Over the next 180 days, patient will work with PharmD and providers to manage symptoms of reflux . Current regimen:  o Omeprazole 91m daily . Interventions: o Determined patient went back to taking omeprazole after finishing Dexilant samples o Discussed patient restarting omeprazole with PCP and PCP okay with that . Patient self care activities - Over the next 180 days, patient will: o Continue omeprazole o Notify PCP if symptoms worsen  Medication management . Pharmacist Clinical Goal(s): o Over the next 180 days, patient will work with PharmD and providers to maintain optimal medication adherence . Current pharmacy: [facility] . Interventions o Comprehensive medication review performed. o Continue current medication management strategy o Perform cost comparison to determine if patient can use [facility] pharmacy with current insurance o Collaborate with Dr. HBenson Norwayabout allopurinol prescription being removed from medication list on 8/6. Notify patient if medication needs to be continued o Discuss indication for clopidogrel at follow up visit . Patient self care activities - Over the next 180 days, patient will: o Focus on medication adherence by considering use of pill box or adherence packaging o Take medications as prescribed o Report any questions or concerns to PharmD and/or provider(s)  Please see past updates related to this goal by clicking on the "Past Updates" button in the selected goal          Chronic Kidney Disease   Patient is currently controlled on the following medications:   Calcitriol 0.282m every other day  Furosemide 4083maily as needed for fluid or  edema  Epoetin alfa injections  Ferrous sulfate 325m35mily with breakfast  Sodium bicarbonate 650mg19mablets by mouth twice daily  We discussed:    Takes furosemide maybe once a week for fluid  Plan Continue current medications     Diabetes   Recent Relevant Labs: Lab Results  Component Value Date/Time   HGBA1C 7.1 (H) 07/01/2020 03:07 PM   HGBA1C 6.0 (H) 02/20/2020 11:42 AM   MICROALBUR 80 02/20/2020 11:46 AM   MICROALBUR 30 01/17/2019 12:39 PM    Checking BG: 2x per Day  Recent FBG Readings: 119,109,112,170,131,138,123 Recent pre-meal BG readings: 145,165,149,158,162,166 Recent 2hr PP BG readings:  Recent HS BG readings:  Patient has failed these meds in past:  Franne Grip, Lantus, [place], [place] Patient is currently controlled on the following medications:   Tresiba 22 units in the evening   We discussed:  Pt reports he has been eating more ice cream and pastries lately which is most likely why his blood sugar has increased  Pt reports drinking some juice as well  HgbA1c goal of <7%, 6% right now  Counseled pt on the importance of diet and exercise  Minimize sweets, carbohydrates, and sugary drinks  Patient reports that he is okay with filling a one month supply of Tresiba at the pharmacy if there are no samples at the clinic  Plan Continue current medications  CPA to work on [place] patient assistance application Supply patient with samples of Tresiba if available   Hyperlipidemia   LDL goal < 70  Last lipids Lab Results  Component Value Date   CHOL 103 06/01/2019   HDL 24 (L) 06/01/2019   LDLCALC 38 06/01/2019   TRIG 263 (H) 06/01/2019   CHOLHDL 4.3 06/01/2019   Hepatic Function Latest Ref Rng & Units 05/07/2020 03/26/2020 01/30/2020  Total Protein 6.0 - 8.5 g/dL - - -  Albumin 3.5 - 5.0 g/dL 3.6 3.7 3.7  AST 0 - 40 IU/L - - -  ALT 0 - 44 IU/L - - -  Alk Phosphatase 39 - 117 IU/L - - -  Total Bilirubin 0.0 - 1.2 mg/dL - - -   Bilirubin, Direct 0.00 - 0.40 mg/dL - - -     The ASCVD Risk score (Parcelas de Navarro., et al., 2013) failed to calculate for the following reasons:   The 2013 ASCVD risk score is only valid for ages 36 to 23   Patient has failed these meds in past: none noted  Patient is currently uncontrolled on the following medications:  . Rosuvastatin 20 mg- take daily   We discussed:  Diet and Exercise in details  Breakfast: oatmeal  Dinner: he purchases meals at [employer] and will get Prime Rib, with potatoes and vegetables.  Stir Fry vegetables   Patient reports that he has not been exercising   He is not motivated to exercise since his wife passed   Silver sneakers on [employer]  Continue current medications  Hypertension   BP goal is:  <130/80  Office blood pressures are  BP Readings from Last 3 Encounters:  07/01/20 118/64  05/30/20 120/74  05/21/20 (!) 157/86   Patient checks BP at home infrequently Patient home BP readings are ranging: Not checking BP at this time   Patient has failed these meds in the past: None noted  Patient is currently controlled on the following medications:  . Amlodipine 5 mg tablet once a day  . Carvedilol 12.5 mg twice daily  We discussed:  Goal to checking BP once a week   To limit salt content in diet.  Plan  Continue current medications   Query Depression   Depression screen [facility] 2/9 09/04/2020 07/01/2020 05/23/2019  Decreased Interest _0 Down, Depressed, Hopeless 2 1 0  PHQ - 2 Score _1 Altered sleeping 1 3 0  Tired, decreased energy 1 1 0  Change in appetite 2 3 0  Feeling bad or failure about yourself  0 0 0  Trouble concentrating 1 3 0  Moving slowly or fidgety/restless 1 0 0  Suicidal thoughts 0 0 0  PHQ-9 Score _2 Difficult doing work/chores Somewhat difficult Not difficult at all Not  difficult at all  Some recent data might be hidden    Patient has failed these meds in past: none noted  Patient is  currently uncontrolled on the following medications: currently not taking any medications   We discussed:    Pt recently lost his wife in November 2021  Misses her dearly   Trying to adjust to a new routine with his wife passing  Has been to [facility] to visit his daughter and her husband  Did a PHQ-9 assessment.   Plan  Continue control with diet and exercise    Vaccines   Reviewed and discussed patient's vaccination history.    Immunization History  Administered Date(s) Administered  . Influenza, High Dose Seasonal PF 05/25/2018, 04/19/2019  . Influenza-Unspecified 05/24/2013, 05/31/2018  . PFIZER SARS-COV-2 Vaccination 10/23/2019, 11/15/2019  . Pneumococcal Polysaccharide-23 05/24/2013, 05/24/2017   We discussed:  Flu Shot HD at [facility] in 2021  Patient qualifies for COVID-19 booster:  Patient getting [place] booster vaccine on 09/10/2020 at [facility]   Plan  Recommended patient receive shingrix vaccine.   Medication Management   Patient's preferred pharmacy is:  [id number] [address], [address] [address] Phone: [phone] Fax: [phone]  Uses pill box? No - he prefers to use the vials. Pt endorses 100% compliance  We discussed: Discussed benefits of medication synchronization, packaging and delivery as well as enhanced pharmacist oversight with [facility].  Plan  Continue current medication management strategy, patient is going to consider [facility] pharmacy and is going to think about it.     Follow up: 3 month phone visit   Orlando Penner, PharmD Clinical Pharmacist [facility] [phone]

## 2020-09-06 ENCOUNTER — Other Ambulatory Visit: Payer: Medicare Other

## 2020-09-06 DIAGNOSIS — Z20822 Contact with and (suspected) exposure to covid-19: Secondary | ICD-10-CM

## 2020-09-09 LAB — NOVEL CORONAVIRUS, NAA: SARS-CoV-2, NAA: NOT DETECTED

## 2020-09-10 ENCOUNTER — Ambulatory Visit: Payer: Medicare Other

## 2020-09-12 ENCOUNTER — Ambulatory Visit: Payer: Medicare Other | Attending: Internal Medicine

## 2020-09-12 ENCOUNTER — Other Ambulatory Visit: Payer: Self-pay

## 2020-09-12 ENCOUNTER — Other Ambulatory Visit: Payer: Medicare Other

## 2020-09-12 DIAGNOSIS — Z23 Encounter for immunization: Secondary | ICD-10-CM

## 2020-09-12 MED ORDER — TRESIBA FLEXTOUCH 200 UNIT/ML ~~LOC~~ SOPN: 22.0000 [IU] | PEN_INJECTOR | Freq: Every day | SUBCUTANEOUS | 1 refills | Status: DC

## 2020-09-12 NOTE — Progress Notes (Signed)
   Covid-19 Vaccination Clinic  Name:  Alec Solis    MRN: [id number] DOB: 15-Jun-1933  09/12/2020  [name] was observed post Covid-19 immunization for 15 minutes without incident. He was provided with Vaccine Information Sheet and instruction to access the V-Safe system.   [name] was instructed to call 911 with any severe reactions post vaccine: Marland Kitchen Difficulty breathing  . Swelling of face and throat  . A fast heartbeat  . A bad rash all over body  . Dizziness and weakness   Immunizations Administered    Name Date Dose VIS Date Route   Pfizer COVID-19 Vaccine 09/12/2020  1:28 PM 0.3 mL 06/12/2020 Intramuscular   Manufacturer: [place]   Lot: [id number]   NDC: [address]-0

## 2020-09-17 ENCOUNTER — Other Ambulatory Visit: Payer: Self-pay | Admitting: Internal Medicine

## 2020-10-01 ENCOUNTER — Ambulatory Visit (INDEPENDENT_AMBULATORY_CARE_PROVIDER_SITE_OTHER): Payer: Medicare Other | Admitting: Internal Medicine

## 2020-10-01 ENCOUNTER — Encounter: Payer: Self-pay | Admitting: Internal Medicine

## 2020-10-01 ENCOUNTER — Other Ambulatory Visit: Payer: Self-pay

## 2020-10-01 ENCOUNTER — Other Ambulatory Visit: Payer: Self-pay | Admitting: Internal Medicine

## 2020-10-01 VITALS — BP 124/78 | HR 66 | Temp 98.2°F | Ht 72.0 in | Wt 188.8 lb

## 2020-10-01 DIAGNOSIS — E1122 Type 2 diabetes mellitus with diabetic chronic kidney disease: Secondary | ICD-10-CM | POA: Diagnosis not present

## 2020-10-01 DIAGNOSIS — Z8616 Personal history of COVID-19: Secondary | ICD-10-CM | POA: Diagnosis not present

## 2020-10-01 DIAGNOSIS — E7800 Pure hypercholesterolemia, unspecified: Secondary | ICD-10-CM | POA: Diagnosis not present

## 2020-10-01 DIAGNOSIS — N1832 Chronic kidney disease, stage 3b: Secondary | ICD-10-CM | POA: Diagnosis not present

## 2020-10-01 DIAGNOSIS — N183 Chronic kidney disease, stage 3 unspecified: Secondary | ICD-10-CM

## 2020-10-01 DIAGNOSIS — I129 Hypertensive chronic kidney disease with stage 1 through stage 4 chronic kidney disease, or unspecified chronic kidney disease: Secondary | ICD-10-CM | POA: Diagnosis not present

## 2020-10-01 DIAGNOSIS — F4321 Adjustment disorder with depressed mood: Secondary | ICD-10-CM

## 2020-10-01 DIAGNOSIS — Z794 Long term (current) use of insulin: Secondary | ICD-10-CM | POA: Diagnosis not present

## 2020-10-01 MED ORDER — AMLODIPINE BESYLATE 5 MG PO TABS
5.0000 mg | ORAL_TABLET | Freq: Every day | ORAL | 1 refills | Status: DC
Start: 2020-10-01 — End: 2021-01-09

## 2020-10-01 MED ORDER — TRUE METRIX BLOOD GLUCOSE TEST VI STRP: ORAL_STRIP | 5 refills | Status: AC

## 2020-10-01 MED ORDER — TRUEPLUS LANCETS 30G MISC: 11 refills | Status: AC

## 2020-10-01 NOTE — Progress Notes (Signed)
I,Katawbba Wiggins,acting as a Education administrator for Maximino Greenland, MD.,have documented all relevant documentation on the behalf of Maximino Greenland, MD,as directed by  Maximino Greenland, MD while in the presence of Maximino Greenland, MD.  This visit occurred during the SARS-CoV-2 public health emergency.  Safety protocols were in place, including screening questions prior to the visit, additional usage of staff PPE, and extensive cleaning of exam room while observing appropriate contact time as indicated for disinfecting solutions.  Subjective:     Patient ID: Alec Solis , male    DOB: 10-15-1932 , 87 y.o.   MRN: [id number]   Chief Complaint  Patient presents with  . Hypertension  . Diabetes  . Hyperlipidemia    HPI  He presents today for diabetes/htn check.  He reports compliance with meds. He is still mourning the loss of his wife due to complications from COVID pneumonia. He reports he is doing better with his eating. He is not drinking as many sodas.   Hypertension This is a chronic problem. The current episode started more than 1 year ago. The problem has been gradually improving since onset. The problem is controlled. Pertinent negatives include no blurred vision, chest pain, headaches, palpitations or shortness of breath. Risk factors for coronary artery disease include diabetes mellitus, dyslipidemia, sedentary lifestyle and male gender. Identifiable causes of hypertension include chronic renal disease.  Diabetes He presents for his follow-up diabetic visit. He has type 2 diabetes mellitus. Pertinent negatives for hypoglycemia include no dizziness or headaches. Pertinent negatives for diabetes include no blurred vision, no chest pain, no fatigue, no polydipsia, no polyphagia and no polyuria. There are no hypoglycemic complications. There are no diabetic complications. He participates in exercise intermittently. Eye exam is current.  Hyperlipidemia Exacerbating diseases include chronic renal  disease and diabetes. Pertinent negatives include no chest pain or shortness of breath. Current antihyperlipidemic treatment includes statins. Compliance problems include adherence to exercise.  Risk factors for coronary artery disease include diabetes mellitus, dyslipidemia, male sex, hypertension and a sedentary lifestyle.     Past Medical History:  Diagnosis Date  . Anemia   . Anxiety   . Arthritis   . Cataract   . Chronic kidney disease   . Depression   . Diabetes mellitus without complication ([place])    type II   . GERD (gastroesophageal reflux disease)   . Gout   . Heart murmur   . Hypertension   . Myocardial infarction ([place])    minor Mi- years ago -   . Varicose veins of left lower extremity      Family History  Problem Relation Age of Onset  . Breast cancer Mother   . Stomach cancer Mother   . Diabetes Mellitus II Maternal Aunt      Current Outpatient Medications:  .  acetaminophen (TYLENOL) 650 MG CR tablet, Take 650 mg by mouth every 8 (eight) hours as needed for pain. , Disp: , Rfl:  .  allopurinol (ZYLOPRIM) 100 MG tablet, TAKE 1 TABLET BY MOUTH TWICE DAILY, Disp: 180 tablet, Rfl: 0 .  aspirin 81 MG tablet, Take 81 mg by mouth daily. , Disp: , Rfl:  .  B-D ULTRAFINE III SHORT PEN 31G X 8 MM MISC, USE AS DIRECTED FOUR TIMES DAILY, Disp: 100 each, Rfl: 3 .  calcitRIOL (ROCALTROL) 0.25 MCG capsule, Take 0.25 mcg by mouth every other day. , Disp: , Rfl:  .  carboxymethylcellulose (REFRESH PLUS) 0.5 % SOLN, Place 1 drop into both  eyes 3 (three) times daily as needed (Watery eyes). , Disp: , Rfl:  .  carvedilol (COREG) 12.5 MG tablet, Take 12.5 mg by mouth 2 (two) times daily with a meal., Disp: , Rfl:  .  ferrous sulfate 325 (65 FE) MG tablet, Take 325 mg by mouth daily with breakfast., Disp: , Rfl:  .  furosemide (LASIX) 40 MG tablet, Take 40 mg by mouth daily as needed for fluid or edema. , Disp: , Rfl:  .  insulin degludec (TRESIBA FLEXTOUCH) 200 UNIT/ML FlexTouch Pen,  Inject 22 Units into the skin daily., Disp: 27 mL, Rfl: 1 .  Insulin Pen Needle 32G X 8 MM MISC, Use as directed, Disp: 100 each, Rfl: 2 .  Multiple Vitamins-Minerals (PRESERVISION AREDS 2 PO), Take 1 tablet by mouth in the morning and at bedtime., Disp: , Rfl:  .  naproxen sodium (ALEVE) 220 MG tablet, Take 220 mg by mouth daily as needed (Body ache)., Disp: , Rfl:  .  Omega-3 Fatty Acids (FISH OIL OMEGA-3 PO), Take 1,200 mg by mouth daily. , Disp: , Rfl:  .  omeprazole (PRILOSEC) 40 MG capsule, TAKE 1 CAPSULE BY MOUTH EVERY DAY BEFORE A MEAL, Disp: 90 capsule, Rfl: 1 .  rosuvastatin (CRESTOR) 20 MG tablet, Take 1 tablet (20 mg total) by mouth See admin instructions. TAKE 20 mg TABLET BY MOUTH MONDAY-FRIDAY, Disp: 75 tablet, Rfl: 1 .  sodium bicarbonate 650 MG tablet, Take 1,300 mg by mouth 2 (two) times daily., Disp: , Rfl:  .  vitamin B-12 (CYANOCOBALAMIN) 1000 MCG tablet, Take 1,000 mcg by mouth daily., Disp: , Rfl:  .  amLODipine (NORVASC) 5 MG tablet, Take 1 tablet (5 mg total) by mouth daily., Disp: 90 tablet, Rfl: 1 .  clopidogrel (PLAVIX) 75 MG tablet, TAKE 1 TABLET(75 MG) BY MOUTH DAILY, Disp: 90 tablet, Rfl: 2 .  glucose blood (TRUE METRIX BLOOD GLUCOSE TEST) test strip, Use as directed to check blood sugars 3 times per day dx: e11.65, Disp: 300 each, Rfl: 5 .  Multiple Vitamins-Minerals (MULTIVITAMIN ADULT PO), Take 1 tablet by mouth daily. [employer] Mega men enhancement, Disp: , Rfl:  .  TRUEplus Lancets 30G MISC, USE AS DIRECTED TO TEST BLOOD SUGAR THREE TIMES DAILY, Disp: 300 each, Rfl: 11   No Known Allergies   Review of Systems  Constitutional: Negative.  Negative for fatigue.  HENT: Negative.   Eyes: Negative for blurred vision.  Respiratory: Negative.  Negative for shortness of breath.   Cardiovascular: Negative.  Negative for chest pain and palpitations.  Gastrointestinal: Negative.   Endocrine: Negative for polydipsia, polyphagia and polyuria.  Musculoskeletal: Negative.    Skin: Negative.   Neurological: Negative for dizziness and headaches.  Psychiatric/Behavioral: Positive for dysphoric mood.  All other systems reviewed and are negative.    Today's Vitals   10/01/20 1134  BP: 124/78  Pulse: 66  Temp: 98.2 F (36.8 C)  TempSrc: Oral  Weight: 188 lb 12.8 oz (85.6 kg)  Height: 6' (1.829 m)  PainSc: 6   PainLoc: Back   Body mass index is 25.61 kg/m.  Wt Readings from Last 3 Encounters:  10/01/20 188 lb 12.8 oz (85.6 kg)  07/01/20 190 lb (86.2 kg)  03/29/20 190 lb (86.2 kg)   Objective:  Physical Exam Vitals and nursing note reviewed.  Constitutional:      Appearance: Normal appearance.  HENT:     Head: Normocephalic and atraumatic.     Nose:     Comments: Masked  Mouth/Throat:     Comments: Masked  Cardiovascular:     Rate and Rhythm: Normal rate and regular rhythm.     Heart sounds: Normal heart sounds.  Pulmonary:     Breath sounds: Normal breath sounds.  Skin:    General: Skin is warm.  Neurological:     General: No focal deficit present.     Mental Status: He is alert and oriented to person, place, and time.         Assessment And Plan:     1. Hypertensive nephropathy Comments: Chronic, well controlled. He will c/w amlodipine and carvedilol. He is encouraged to continue with low sodium diet.   2. Type 2 diabetes mellitus with stage 3b chronic kidney disease, with long-term current use of insulin (HCC) Comments: Chronic, I will check labs as listed below. He is encouraged to schedule a  Renal appt.  - CMP14+EGFR - Hemoglobin A1c  3. Pure hypercholesterolemia Comments: Chronic. He agrees to return to check lipids, he is not fasting today. Importance of statin therapy was discussed with the patient.  - Lipid panel; Future  4. Grief Comments: Pt advised his feelings are to be expected. He declines grief counseling at this time.   5. Personal history of COVID-19 Comments: He is fully vaccinated and boosted.      Patient was given opportunity to ask questions. Patient verbalized understanding of the plan and was able to repeat key elements of the plan. All questions were answered to their satisfaction.   I, Maximino Greenland, MD, have reviewed all documentation for this visit. The documentation on 10/19/20 for the exam, diagnosis, procedures, and orders are all accurate and complete.  THE PATIENT IS ENCOURAGED TO PRACTICE SOCIAL DISTANCING DUE TO THE COVID-19 PANDEMIC.

## 2020-10-02 LAB — CMP14+EGFR
ALT: 12 IU/L (ref 0–44)
AST: 13 IU/L (ref 0–40)
Albumin/Globulin Ratio: 1.3 (ref 1.2–2.2)
Albumin: 4.3 g/dL (ref 3.6–4.6)
Alkaline Phosphatase: 130 IU/L — ABNORMAL HIGH (ref 44–121)
BUN/Creatinine Ratio: 13 (ref 10–24)
BUN: 29 mg/dL — ABNORMAL HIGH (ref 8–27)
Bilirubin Total: <0.2 mg/dL (ref 0.0–1.2)
CO2: 22 mmol/L (ref 20–29)
Calcium: 9.6 mg/dL (ref 8.6–10.2)
Chloride: 103 mmol/L (ref 96–106)
Creatinine, Ser: 2.16 mg/dL — ABNORMAL HIGH (ref 0.76–1.27)
GFR calc Af Amer: 31 mL/min/{1.73_m2} — ABNORMAL LOW (ref >59)
GFR calc non Af Amer: 27 mL/min/{1.73_m2} — ABNORMAL LOW (ref >59)
Globulin, Total: 3.3 g/dL (ref 1.5–4.5)
Glucose: 134 mg/dL — ABNORMAL HIGH (ref 65–99)
Potassium: 4.5 mmol/L (ref 3.5–5.2)
Sodium: 141 mmol/L (ref 134–144)
Total Protein: 7.6 g/dL (ref 6.0–8.5)

## 2020-10-02 LAB — HEMOGLOBIN A1C
Est. average glucose Bld gHb Est-mCnc: 137 mg/dL
Hgb A1c MFr Bld: 6.4 % — ABNORMAL HIGH (ref 4.8–5.6)

## 2020-10-11 ENCOUNTER — Other Ambulatory Visit: Payer: Self-pay | Admitting: Internal Medicine

## 2020-10-21 ENCOUNTER — Ambulatory Visit: Payer: Medicare Other | Admitting: Podiatry

## 2020-10-21 ENCOUNTER — Telehealth: Payer: Self-pay

## 2020-10-21 NOTE — Telephone Encounter (Signed)
[name], MD  Candiss Norse T, CMA Please contact pt -have him come in for lab visit to check lipid panel. Orders are in already.He may come Tuesday morning if possible. Please ask him to fast.   Thanks!   LVM for pt to call the office for lab appt

## 2020-10-22 ENCOUNTER — Telehealth: Payer: Self-pay

## 2020-10-22 NOTE — Telephone Encounter (Signed)
Alec Chard, MD  Candiss Norse T, CMA Please contact pt -have him come in for lab visit to check lipid panel. Orders are in already.He may come Tuesday morning if possible. Please ask him to fast.   Thanks!    LVM for pt to call the office regarding provider message

## 2020-10-24 ENCOUNTER — Other Ambulatory Visit: Payer: Self-pay | Admitting: Internal Medicine

## 2020-10-24 ENCOUNTER — Other Ambulatory Visit: Payer: Medicare Other

## 2020-10-24 ENCOUNTER — Other Ambulatory Visit: Payer: Self-pay

## 2020-10-24 DIAGNOSIS — E7800 Pure hypercholesterolemia, unspecified: Secondary | ICD-10-CM | POA: Diagnosis not present

## 2020-10-25 LAB — LIPID PANEL
Chol/HDL Ratio: 4.4 ratio (ref 0.0–5.0)
Cholesterol, Total: 115 mg/dL (ref 100–199)
HDL: 26 mg/dL — ABNORMAL LOW (ref >39)
LDL Chol Calc (NIH): 48 mg/dL (ref 0–99)
Triglycerides: 264 mg/dL — ABNORMAL HIGH (ref 0–149)
VLDL Cholesterol Cal: 41 mg/dL — ABNORMAL HIGH (ref 5–40)

## 2020-10-28 DIAGNOSIS — N183 Chronic kidney disease, stage 3 unspecified: Secondary | ICD-10-CM | POA: Diagnosis not present

## 2020-10-28 DIAGNOSIS — M109 Gout, unspecified: Secondary | ICD-10-CM | POA: Diagnosis not present

## 2020-10-28 DIAGNOSIS — I129 Hypertensive chronic kidney disease with stage 1 through stage 4 chronic kidney disease, or unspecified chronic kidney disease: Secondary | ICD-10-CM | POA: Diagnosis not present

## 2020-10-28 DIAGNOSIS — E1122 Type 2 diabetes mellitus with diabetic chronic kidney disease: Secondary | ICD-10-CM | POA: Diagnosis not present

## 2020-10-28 DIAGNOSIS — D631 Anemia in chronic kidney disease: Secondary | ICD-10-CM | POA: Diagnosis not present

## 2020-10-28 DIAGNOSIS — N189 Chronic kidney disease, unspecified: Secondary | ICD-10-CM | POA: Diagnosis not present

## 2020-10-28 DIAGNOSIS — N2581 Secondary hyperparathyroidism of renal origin: Secondary | ICD-10-CM | POA: Diagnosis not present

## 2020-10-28 DIAGNOSIS — R300 Dysuria: Secondary | ICD-10-CM | POA: Diagnosis not present

## 2020-11-05 ENCOUNTER — Telehealth: Payer: Self-pay

## 2020-11-05 NOTE — Chronic Care Management (AMB) (Signed)
Chronic Care Management Pharmacy Assistant   Name: Alec Solis  MRN: [id number] DOB: Aug 28, 1932   Reason for Encounter: Hypertension Adherence Call     Recent office visits:  10/01/20-Sanders, Bailey Mech, MD ([place]).   Recent consult visits:  None  Hospital visits:  None in previous 6 months   Medication changes indicated:  10/01/20- Misc Natural Products (MENS PROSTATE HEALTH FORMULA PO) (disc).   Medications: Outpatient Encounter Medications as of 11/05/2020  Medication Sig  . acetaminophen (TYLENOL) 650 MG CR tablet Take 650 mg by mouth every 8 (eight) hours as needed for pain.   Marland Kitchen allopurinol (ZYLOPRIM) 100 MG tablet TAKE 1 TABLET BY MOUTH TWICE DAILY  . amLODipine (NORVASC) 5 MG tablet Take 1 tablet (5 mg total) by mouth daily.  Marland Kitchen aspirin 81 MG tablet Take 81 mg by mouth daily.   . B-D ULTRAFINE III SHORT PEN 31G X 8 MM MISC USE AS DIRECTED FOUR TIMES DAILY  . calcitRIOL (ROCALTROL) 0.25 MCG capsule Take 0.25 mcg by mouth every other day.   . carboxymethylcellulose (REFRESH PLUS) 0.5 % SOLN Place 1 drop into both eyes 3 (three) times daily as needed (Watery eyes).   . carvedilol (COREG) 12.5 MG tablet Take 12.5 mg by mouth 2 (two) times daily with a meal.  . clopidogrel (PLAVIX) 75 MG tablet TAKE 1 TABLET(75 MG) BY MOUTH DAILY  . ferrous sulfate 325 (65 FE) MG tablet Take 325 mg by mouth daily with breakfast.  . furosemide (LASIX) 40 MG tablet Take 40 mg by mouth daily as needed for fluid or edema.   Marland Kitchen glucose blood (TRUE METRIX BLOOD GLUCOSE TEST) test strip Use as directed to check blood sugars 3 times per day dx: e11.65  . insulin degludec (TRESIBA FLEXTOUCH) 200 UNIT/ML FlexTouch Pen Inject 22 Units into the skin daily.  . Insulin Pen Needle 32G X 8 MM MISC Use as directed  . Multiple Vitamins-Minerals (MULTIVITAMIN ADULT PO) Take 1 tablet by mouth daily. [place] Mega men enhancement  . Multiple Vitamins-Minerals (PRESERVISION AREDS 2 PO) Take 1 tablet by mouth in the morning  and at bedtime.  . naproxen sodium (ALEVE) 220 MG tablet Take 220 mg by mouth daily as needed (Body ache).  . Omega-3 Fatty Acids (FISH OIL OMEGA-3 PO) Take 1,200 mg by mouth daily.   Marland Kitchen omeprazole (PRILOSEC) 40 MG capsule TAKE 1 CAPSULE BY MOUTH EVERY DAY BEFORE A MEAL  . rosuvastatin (CRESTOR) 20 MG tablet Take 1 tablet (20 mg total) by mouth See admin instructions. TAKE 20 mg TABLET BY MOUTH MONDAY-FRIDAY  . sodium bicarbonate 650 MG tablet Take 1,300 mg by mouth 2 (two) times daily.  . TRUEplus Lancets 30G MISC USE AS DIRECTED TO TEST BLOOD SUGAR THREE TIMES DAILY  . vitamin B-12 (CYANOCOBALAMIN) 1000 MCG tablet Take 1,000 mcg by mouth daily.   No facility-administered encounter medications on file as of 11/05/2020.    Reviewed chart prior to disease state call. Spoke with patient regarding BP  Recent Office Vitals: BP Readings from Last 3 Encounters:  10/01/20 124/78  07/01/20 118/64  05/30/20 120/74   Pulse Readings from Last 3 Encounters:  10/01/20 66  07/01/20 84  05/30/20 95    Wt Readings from Last 3 Encounters:  10/01/20 188 lb 12.8 oz (85.6 kg)  07/01/20 190 lb (86.2 kg)  03/29/20 190 lb (86.2 kg)     Kidney Function Lab Results  Component Value Date/Time   CREATININE 2.16 (H) 10/01/2020 12:43 PM   CREATININE  2.22 (H) 07/01/2020 03:07 PM   GFRNONAA 27 (L) 10/01/2020 12:43 PM   GFRAA 31 (L) 10/01/2020 12:43 PM    BMP Latest Ref Rng & Units 10/01/2020 07/01/2020 05/07/2020  Glucose 65 - 99 mg/dL 134(H) 152(H) 146(H)  BUN 8 - 27 mg/dL 29(H) 36(H) 29(H)  Creatinine 0.76 - 1.27 mg/dL 2.16(H) 2.22(H) 1.78(H)  BUN/Creat Ratio 10 - 24 13 16  -  Sodium 134 - 144 mmol/L 141 141 140  Potassium 3.5 - 5.2 mmol/L 4.5 5.2 4.2  Chloride 96 - 106 mmol/L 103 106 111  CO2 20 - 29 mmol/L 22 18(L) 21(L)  Calcium 8.6 - 10.2 mg/dL 9.6 9.6 9.2    . Current antihypertensive regimen:  ? Amlodipine 5mg -daily ? Carvedilol 12.5mg - twice daily  . How often are you checking your Blood  Pressure? weekly  . Current home BP readings: 128/78 am 11/15/20.  . What recent interventions/DTPs have been made by any provider to improve Blood Pressure control since last CPP Visit:  - Patient reports he is taking medications as directed.  . Any recent hospitalizations or ED visits since last visit with CPP? No   . What diet changes have been made to improve Blood Pressure Control?  o Patient reports he is eating healthier in general, patient states he doesn't have much of an appetite. He eats breakfast which consist of oatmeal. Patient voiced he eats out at [employer], doesn't eat out too often.  . What exercise is being done to improve your Blood Pressure Control?  o Patient reports he is walking for about an hour or so daily.   Adherence Review: Is the patient currently on ACE/ARB medication? No Does the patient have >5 day gap between last estimated fill dates? No   Star Rating Drugs: Rosuvastatin 20 MG tablet: 90 DS, Last filled on 09/18/20 at [facility].   11/15/20-Successful call to the patient, patient voiced he is doing fine with no questions or concerns regarding his blood pressure at this time. Patient stated he has refills for all medications.   11/05/20-Called and spoke with the patient who stated this was not a good time to speak regarding his blood pressure monitoring. The patient voiced he is currently in [place] and requested a call back on Friday 11/08/20 at 11 AM when he will be back in [place]. I told the patient I will call back at the date and time requested. The patient voiced understanding.   Orlando Penner, CPP Notified.  Raynelle Highland, [place] Pharmacist Assistant [phone] CCM Total Time: 54 minutes

## 2020-11-15 ENCOUNTER — Other Ambulatory Visit (HOSPITAL_COMMUNITY): Payer: Self-pay

## 2020-11-18 ENCOUNTER — Encounter (HOSPITAL_COMMUNITY): Payer: Medicare Other

## 2020-11-25 ENCOUNTER — Encounter (HOSPITAL_COMMUNITY): Payer: Medicare Other

## 2020-12-11 ENCOUNTER — Ambulatory Visit: Payer: Medicare Other | Admitting: Podiatry

## 2020-12-11 ENCOUNTER — Other Ambulatory Visit: Payer: Self-pay | Admitting: Internal Medicine

## 2020-12-16 ENCOUNTER — Other Ambulatory Visit: Payer: Self-pay

## 2020-12-16 ENCOUNTER — Ambulatory Visit (INDEPENDENT_AMBULATORY_CARE_PROVIDER_SITE_OTHER): Payer: Medicare Other | Admitting: Podiatry

## 2020-12-16 ENCOUNTER — Encounter: Payer: Self-pay | Admitting: Podiatry

## 2020-12-16 DIAGNOSIS — M79674 Pain in right toe(s): Secondary | ICD-10-CM

## 2020-12-16 DIAGNOSIS — M79675 Pain in left toe(s): Secondary | ICD-10-CM | POA: Diagnosis not present

## 2020-12-16 DIAGNOSIS — E1122 Type 2 diabetes mellitus with diabetic chronic kidney disease: Secondary | ICD-10-CM

## 2020-12-16 DIAGNOSIS — Q828 Other specified congenital malformations of skin: Secondary | ICD-10-CM

## 2020-12-16 DIAGNOSIS — M2012 Hallux valgus (acquired), left foot: Secondary | ICD-10-CM

## 2020-12-16 DIAGNOSIS — L84 Corns and callosities: Secondary | ICD-10-CM

## 2020-12-16 DIAGNOSIS — B351 Tinea unguium: Secondary | ICD-10-CM | POA: Diagnosis not present

## 2020-12-16 DIAGNOSIS — M2011 Hallux valgus (acquired), right foot: Secondary | ICD-10-CM

## 2020-12-16 DIAGNOSIS — N183 Chronic kidney disease, stage 3 unspecified: Secondary | ICD-10-CM

## 2020-12-16 NOTE — Progress Notes (Signed)
This patient returns to my office for at risk foot care.  This patient requires this care by a professional since this patient will be at risk due to having diabetes mellitus with kidney disease This patient is unable to cut nails himself since the patient cannot reach his nails.These nails are painful walking and wearing shoes.  This patient presents for at risk foot care today.  General Appearance  Alert, conversant and in no acute stress.  Vascular  Dorsalis pedis  pulse is weakly palpable left foot. Posterior tibial pulse is absent left.  Dorsalis pedis and posterior tibial pulses are absent right foot..  Capillary return is within normal limits  bilaterally. Temperature is within normal limits  bilaterally.  Neurologic  Senn-Weinstein monofilament wire test within normal limits  bilaterally. Muscle power within normal limits bilaterally.  Nails Thick disfigured discolored nails with subungual debris  from hallux to fifth toes bilaterally. No evidence of bacterial infection or drainage bilaterally.  Orthopedic  No limitations of motion  feet .  No crepitus or effusions noted.  No bony pathology or digital deformities noted.  HAV  Left foot.  Skin  normotropic skin  noted bilaterally.  No signs of infections or ulcers noted.   Callus noted sub 3, sub 5 right and under right hallux.   Onychomycosis  Pain in right toes  Pain in left toes.  Callus  B/L.  Consent was obtained for treatment procedures.   Mechanical debridement of nails 1-5  bilaterally performed with a nail nipper. Debride callus with # 15 blade  B/L. Filed with dremel without incident.    Return office visit  3 months.  To see Dr.  Adah Perl.                   Told patient to return for periodic foot care and evaluation due to potential at risk complications.   Gardiner Barefoot [name]

## 2020-12-17 ENCOUNTER — Other Ambulatory Visit: Payer: Self-pay | Admitting: Internal Medicine

## 2020-12-19 ENCOUNTER — Other Ambulatory Visit: Payer: Self-pay | Admitting: Internal Medicine

## 2020-12-30 ENCOUNTER — Other Ambulatory Visit: Payer: Self-pay

## 2021-01-01 ENCOUNTER — Telehealth: Payer: Self-pay

## 2021-01-01 ENCOUNTER — Ambulatory Visit: Payer: Medicare Other | Admitting: Internal Medicine

## 2021-01-01 NOTE — Chronic Care Management (AMB) (Signed)
Left patient a voicemail with appointment reminder with Orlando Penner ,[place] on 05-12--2022 at 8:30. Instructed patient to have all meds/supplements, any logs available for review and if unable to keep appointment to call to reschedule.  [place]  [phone]

## 2021-01-02 ENCOUNTER — Ambulatory Visit (INDEPENDENT_AMBULATORY_CARE_PROVIDER_SITE_OTHER): Payer: Medicare Other

## 2021-01-02 DIAGNOSIS — N1832 Chronic kidney disease, stage 3b: Secondary | ICD-10-CM

## 2021-01-02 DIAGNOSIS — Z794 Long term (current) use of insulin: Secondary | ICD-10-CM

## 2021-01-02 DIAGNOSIS — E1122 Type 2 diabetes mellitus with diabetic chronic kidney disease: Secondary | ICD-10-CM | POA: Diagnosis not present

## 2021-01-02 DIAGNOSIS — E7800 Pure hypercholesterolemia, unspecified: Secondary | ICD-10-CM

## 2021-01-02 NOTE — Progress Notes (Signed)
Chronic Care Management Pharmacy Note  01/02/2021 Name:  Alec Solis MRN:  [id number] DOB:  August 08, 1933  Subjective: Alec Solis is an 87 y.o. year old male who is a primary patient of [name], MD.  The CCM team was consulted for assistance with disease management and care coordination needs.  Patients wife passed away in 2023-07-15. Patient reports that he is going to update his chart to include his daughter on it since his chart has his wife on it.   Engaged with patient by telephone for follow up visit in response to provider referral for pharmacy case management and/or care coordination services.   Consent to Services:  The patient was given information about Chronic Care Management services, agreed to services, and gave verbal consent prior to initiation of services.  Please see initial visit note for detailed documentation.   Patient Care Team: [name], MD as PCP - General (Internal Medicine) Mayford Knife, [facility] (Pharmacist)  Recent office visits: 10/01/2020 PCP OV   Recent consult visits: 12/16/2020 [facility] visits: None in previous 6 months  Objective:  Lab Results  Component Value Date   CREATININE 2.16 (H) 10/01/2020   BUN 29 (H) 10/01/2020   GFRNONAA 27 (L) 10/01/2020   GFRAA 31 (L) 10/01/2020   NA 141 10/01/2020   K 4.5 10/01/2020   CALCIUM 9.6 10/01/2020   CO2 22 10/01/2020   GLUCOSE 134 (H) 10/01/2020    Lab Results  Component Value Date/Time   HGBA1C 6.4 (H) 10/01/2020 12:43 PM   HGBA1C 7.1 (H) 07/01/2020 03:07 PM   MICROALBUR 80 02/20/2020 11:46 AM   MICROALBUR 30 01/17/2019 12:39 PM    Last diabetic Eye exam:  Lab Results  Component Value Date/Time   HMDIABEYEEXA No Retinopathy 03/22/2020 12:00 AM    Last diabetic Foot exam: No results found for: HMDIABFOOTEX   Lab Results  Component Value Date   CHOL 115 10/24/2020   HDL 26 (L) 10/24/2020   LDLCALC 48 10/24/2020   TRIG 264 (H) 10/24/2020   CHOLHDL 4.4  10/24/2020    Hepatic Function Latest Ref Rng & Units 10/01/2020 05/07/2020 03/26/2020  Total Protein 6.0 - 8.5 g/dL 7.6 - -  Albumin 3.6 - 4.6 g/dL 4.3 3.6 3.7  AST 0 - 40 IU/L 13 - -  ALT 0 - 44 IU/L 12 - -  Alk Phosphatase 44 - 121 IU/L 130(H) - -  Total Bilirubin 0.0 - 1.2 mg/dL <0.2 - -  Bilirubin, Direct 0.00 - 0.40 mg/dL - - -    No results found for: TSH, FREET4  CBC Latest Ref Rng & Units 05/21/2020 05/07/2020 04/23/2020  WBC 3.4 - 10.8 x10E3/uL - - -  Hemoglobin 13.0 - 17.0 g/dL 11.0(L) 9.9(L) 9.9(L)  Hematocrit 37.5 - 51.0 % - - -  Platelets 150 - 450 x10E3/uL - - -    No results found for: VD25OH  Clinical ASCVD: No  The ASCVD Risk score Alec Solis DC Jr., et al., 2013) failed to calculate for the following reasons:   The 2013 ASCVD risk score is only valid for ages 85 to 79    Depression screen PHQ 2/9 09/04/2020 07/01/2020 05/23/2019  Decreased Interest _0 Down, Depressed, Hopeless 2 1 0  PHQ - 2 Score _1 Altered sleeping 1 3 0  Tired, decreased energy 1 1 0  Change in appetite 2 3 0  Feeling bad or failure about yourself  0 0 0  Trouble  concentrating 1 3 0  Moving slowly or fidgety/restless 1 0 0  Suicidal thoughts 0 0 0  PHQ-9 Score _0 Difficult doing work/chores Somewhat difficult Not difficult at all Not difficult at all  Some recent data might be hidden     Social History   Tobacco Use  Smoking Status Former Smoker  . Packs/day: 0.25  . Years: 50.00  . Pack years: 12.50  . Types: Cigarettes  Smokeless Tobacco Never Used   BP Readings from Last 3 Encounters:  10/01/20 124/78  07/01/20 118/64  05/30/20 120/74   Pulse Readings from Last 3 Encounters:  10/01/20 66  07/01/20 84  05/30/20 95   Wt Readings from Last 3 Encounters:  10/01/20 188 lb 12.8 oz (85.6 kg)  07/01/20 190 lb (86.2 kg)  03/29/20 190 lb (86.2 kg)   BMI Readings from Last 3 Encounters:  10/01/20 25.61 kg/m  07/01/20 25.77 kg/m  03/29/20 25.77 kg/m     Assessment/Interventions: Review of patient past medical history, allergies, medications, health status, including review of consultants reports, laboratory and other test data, was performed as part of comprehensive evaluation and provision of chronic care management services.   SDOH:  (Social Determinants of Health) assessments and interventions performed: Yes  SDOH Screenings   Alcohol Screen: Not on file  Depression (PHQ2-9): Medium Risk  . PHQ-2 Score: 10  Financial Resource Strain: Low Risk   . Difficulty of Paying Living Expenses: Not very hard  Food Insecurity: Not on file  Housing: Not on file  Physical Activity: Not on file  Social Connections: Not on file  Stress: Not on file  Tobacco Use: Medium Risk  . Smoking Tobacco Use: Former Smoker  . Smokeless Tobacco Use: Never Used  Transportation Needs: Not on file    [place]  No Known Allergies  Medications Reviewed Today    Reviewed by Mayford Knife, [place] (Pharmacist) on 01/02/21 at [place]  Med List Status: <None>  Medication Order Taking? Sig Documenting Provider Last Dose Status Informant  acetaminophen (TYLENOL) 650 MG CR tablet [id number] Yes Take 650 mg by mouth every 8 (eight) hours as needed for pain.  [provider] Taking Active Self  allopurinol (ZYLOPRIM) 100 MG tablet [id number] Yes TAKE 1 TABLET BY MOUTH TWICE DAILY [name], MD Taking Active   amLODipine (NORVASC) 5 MG tablet [id number] Yes Take 1 tablet (5 mg total) by mouth daily. [name], MD Taking Active   aspirin 81 MG tablet [id number] Yes Take 81 mg by mouth daily.  [provider] Taking Active Self  B-D ULTRAFINE III SHORT PEN 31G X 8 MM MISC [id number] Yes USE AS DIRECTED FOUR TIMES DAILY [name], MD Taking Active Self  calcitRIOL (ROCALTROL) 0.25 MCG capsule [id number] Yes Take 0.25 mcg by mouth every other day.  [provider] Taking Active Self  carboxymethylcellulose (REFRESH PLUS) 0.5 % SOLN  [id number] Yes Place 1 drop into both eyes 3 (three) times daily as needed (Watery eyes).  [provider] Taking Active Self  carvedilol (COREG) 12.5 MG tablet [id number] Yes Take 12.5 mg by mouth 2 (two) times daily with a meal. [provider] Taking Active Self  clopidogrel (PLAVIX) 75 MG tablet [id number] Yes TAKE 1 TABLET(75 MG) BY MOUTH DAILY [name], MD Taking Active   ferrous sulfate 325 (65 FE) MG tablet [id number] Yes Take 325 mg by mouth daily with breakfast. [provider] Taking Active Self  furosemide (LASIX) 40 MG tablet [id number]  Yes Take 40 mg by mouth daily as needed for fluid or edema.  [provider] Taking Active Self           Med Note Gentry Roch   Wed Mar 27, 2020 10:27 AM)    glucose blood (TRUE METRIX BLOOD GLUCOSE TEST) test strip [id number] Yes Use as directed to check blood sugars 3 times per day dx: e11.65 [name], MD Taking Active   insulin degludec (TRESIBA FLEXTOUCH) 200 UNIT/ML FlexTouch Pen [id number] Yes Inject 22 Units into the skin daily. [name], MD Taking Active   Insulin Pen Needle 32G X 8 MM MISC [id number] Yes Use as directed [name], MD Taking Active Self  Multiple Vitamins-Minerals (MULTIVITAMIN ADULT PO) [id number] Yes Take 1 tablet by mouth daily. Van Mega men enhancement [provider] Taking Active Self           Med Note Gentry Roch   Wed Mar 27, 2020 10:37 AM)    Multiple Vitamins-Minerals (PRESERVISION AREDS 2 PO) [id number] Yes Take 1 tablet by mouth in the morning and at bedtime. [provider] Taking Active   naproxen sodium (ALEVE) 220 MG tablet [id number] Yes Take 220 mg by mouth daily as needed (Body ache). [provider] Taking Active   Omega-3 Fatty Acids (FISH OIL OMEGA-3 PO) [id number] Yes Take 1,200 mg by mouth daily.  [provider] Taking Active Self           Med Note Nadara Eaton Mar 27, 2020 10:31 AM)    omeprazole  (PRILOSEC) 40 MG capsule [id number] Yes TAKE 1 CAPSULE BY MOUTH EVERY DAY BEFORE A MEAL [name], MD Taking Active   ondansetron (ZOFRAN) 4 MG tablet [id number] Yes TAKE 1 TABLET(4 MG) BY MOUTH DAILY AS NEEDED FOR NAUSEA OR VOMITING [name], MD Taking Active   rosuvastatin (CRESTOR) 20 MG tablet [id number] Yes TAKE 1 TABLET BY MOUTH DAILY ON MONDAY TO Billy Fischer, MD Taking Active   sodium bicarbonate 650 MG tablet [id number] Yes Take 1,300 mg by mouth 2 (two) times daily. [provider] Taking Active Self  TRUEplus Lancets 30G MISC [id number] Yes USE AS DIRECTED TO TEST BLOOD SUGAR THREE TIMES DAILY [name], MD Taking Active   vitamin B-12 (CYANOCOBALAMIN) 1000 MCG tablet [id number] Yes Take 1,000 mcg by mouth daily. [provider] Taking Active Self          Patient Active Problem List   Diagnosis Date Noted  . Black stools 03/06/2020  . Gastroesophageal reflux disease without esophagitis 03/06/2020  . Paget's disease of bone 10/23/2018  . Acute renal failure (ARF) ([place]) 06/11/2016  . Hyperglycemia 06/11/2016  . Deficiency anemia 12/09/2011  . Diabetes mellitus with stage 3 chronic kidney disease ([place]) 12/09/2011  . Renal insufficiency 12/09/2011    Immunization History  Administered Date(s) Administered  . Influenza, High Dose Seasonal PF 05/25/2018, 04/19/2019  . Influenza-Unspecified 05/24/2013, 05/31/2018  . PFIZER(Purple Top)SARS-COV-2 Vaccination 10/23/2019, 11/15/2019, 09/12/2020  . Pneumococcal Polysaccharide-23 05/24/2013, 05/24/2017    Conditions to be addressed/monitored:  Hyperlipidemia and Diabetes  Care Plan : [place]  Updates made by Mayford Knife, [place] since 01/02/2021 12:00 AM    Problem: HLD, DM II   Priority: High    Long-Range Goal: Disease Management   This Visit's Progress: On track  Priority: High  Note:     Current Barriers:  . Unable to independently afford treatment  regimen . Unable to independently monitor therapeutic efficacy  Pharmacist Clinical Goal(s):  Marland Kitchen Patient will verbalize ability to afford treatment regimen . achieve adherence to monitoring guidelines and medication adherence to achieve therapeutic efficacy through collaboration with PharmD and provider.   Interventions: . 1:1 collaboration with [name], MD regarding development and update of comprehensive plan of care as evidenced by provider attestation and co-signature . Inter-disciplinary care team collaboration (see longitudinal plan of care) . Comprehensive medication review performed; medication list updated in electronic medical record   Diabetes (A1c goal <7%) -Controlled -Current medications: . [name] Flextouch 200 unit/ml- inject 22 units daily at night  o Patient reports the copay is 60 dollars per month  -Current home glucose readings . fasting glucose: 131 - 120  . post prandial glucose: 67-two weeks ago, didn't eat all day - 212  -Reports hypoglycemic/hyperglycemic symptoms  -Patient reports that he ate some candy and it went back up -Current meal patterns:  . breakfast: oatmeal, cream of wheat, or boiled eggs  . lunch: sometimes he does not eat lunch  . dinner: vegetables, roast beef - eating a lot of chicken  . drinks: water, and apple juice - ice cubes in the juice to water it down  -Current exercise: he is walking everyday for twenty minutes.  -Educated on A1c and blood sugar goals; Prevention and management of hypoglycemic episodes; -Explained to patient that we are still missing his financial documentation and signature for patient assistance, he voiced understanding and said he would bring it with him during his next office visit.  Benefits of routine self-monitoring of blood sugar; -Counseled to check feet daily and get yearly eye exams -Recommended to continue current medication   Hyperlipidemia: (LDL goal < 70) -Not ideally controlled -Current  treatment: . Rosuvastatin 20 mg tablet once per day  -Current dietary patterns: patient reports eating Prime Rib from [employer] for 14 dollars he has red potatoes, broccoli, carrots, salad and vegetable mix  -Patient is going to cut the Prime Rib slice and only eat 1/2 of it and adding more vegetables to the meal.   -Patient is going to add more vegetables to his diet -Current exercise habits: he is walking everyday for twenty minutes.  -Educated on Cholesterol goals;  Benefits of statin for ASCVD risk reduction; Importance of limiting foods high in cholesterol; Exercise goal of 150 minutes per week; -Counseled on diet and exercise extensively Recommended to continue current medication  Health Maintenance -Vaccine gaps:   - 2 nd  COVID-19 Booster Shot    -Confirmation number:  [id number]  -Recommended to continue current medication Collaborated with patient to schedule COVID-19 booster shot.    Patient Goals/Self-Care Activities . Patient will:  - take medications as prescribed target a minimum of 150 minutes of moderate intensity exercise weekly engage in dietary modifications by increasing the amount of vegetables you are eating.   Follow Up Plan: Telephone follow up appointment with care management team member scheduled for: The patient has been provided with contact information for the care management team and has been advised to call with any health related questions or concerns.  Next PCP appointment scheduled for:  01/09/2021  Next AWV (Annual Wellness Visit) scheduled for: 01/30/2021       Medication Assistance: Application for [employer]   medication assistance program. in process.  Anticipated assistance start date 01/2021.  See plan of care for additional detail.  Patient's preferred pharmacy is:  [facility] [place], [address]  [place] [address] Phone: [phone]  Fax: [phone]  Uses pill box? Yes Pt endorses 90% compliance  We discussed: Benefits of medication synchronization, packaging and delivery as well as enhanced pharmacist oversight with [employer]. Patient decided to: Continue current medication management strategy  Care Plan and Follow Up Patient Decision:  Patient agrees to Care Plan and Follow-up.  Plan: The patient has been provided with contact information for the care management team and has been advised to call with any health related questions or concerns.   Orlando Penner, PharmD Clinical Pharmacist [facility] [phone]

## 2021-01-02 NOTE — Patient Instructions (Addendum)
Visit Information It was great speaking with you today!  Please let me know if you have any questions about our visit.  Goals Addressed            This Visit's Progress   . Manage My Medicine       Timeframe:  Long-Range Goal Priority:  High Start Date:                             Expected End Date:                       Follow Up Date : 03/04/2021   - call for medicine refill 2 or 3 days before it runs out - call if I am sick and can't take my medicine - keep a list of all the medicines I take; vitamins and herbals too - use an alarm clock or phone to remind me to take my medicine    Why is this important?   . These steps will help you keep on track with your medicines.   Notes:  Patient to bring in financial information to next appointment with Dr. Baird Cancer on 01/09/2021 for his [place] application.        Patient Care Plan: CCM Pharmacy Care Plan    Problem Identified: HLD, DM II   Priority: High    Long-Range Goal: Disease Management   This Visit's Progress: On track  Priority: High  Note:     Current Barriers:  . Unable to independently afford treatment regimen . Unable to independently monitor therapeutic efficacy  Pharmacist Clinical Goal(s):  Marland Kitchen Patient will verbalize ability to afford treatment regimen . achieve adherence to monitoring guidelines and medication adherence to achieve therapeutic efficacy through collaboration with PharmD and provider.   Interventions: . 1:1 collaboration with [name], MD regarding development and update of comprehensive plan of care as evidenced by provider attestation and co-signature . Inter-disciplinary care team collaboration (see longitudinal plan of care) . Comprehensive medication review performed; medication list updated in electronic medical record   Diabetes (A1c goal <7%) -Controlled -Current medications: . [name] Flextouch 200 unit/ml- inject 22 units daily at night  o Patient reports the copay is 60  dollars per month  -Current home glucose readings . fasting glucose: 131 - 120  . post prandial glucose: 67-two weeks ago, didn't eat all day - 212  -Reports hypoglycemic/hyperglycemic symptoms  -Patient reports that he ate some candy and it went back up -Current meal patterns:  . breakfast: oatmeal, cream of wheat, or boiled eggs  . lunch: sometimes he does not eat lunch  . dinner: vegetables, roast beef - eating a lot of chicken  . drinks: water, and apple juice - ice cubes in the juice to water it down  -Current exercise: he is walking everyday for twenty minutes.  -Educated on A1c and blood sugar goals; Prevention and management of hypoglycemic episodes; -Explained to patient that we are still missing his financial documentation and signature for patient assistance, he voiced understanding and said he would bring it with him during his next office visit.  Benefits of routine self-monitoring of blood sugar; -Counseled to check feet daily and get yearly eye exams -Recommended to continue current medication   Hyperlipidemia: (LDL goal < 70) -Not ideally controlled -Current treatment: . Rosuvastatin 20 mg tablet once per day  -Current dietary patterns: patient reports eating Prime Rib from [employer] for 14  dollars he has red potatoes, broccoli, carrots, salad and vegetable mix  -Patient is going to cut the Prime Rib slice and only eat 1/2 of it and adding more vegetables to the meal.   -Patient is going to add more vegetables to his diet -Current exercise habits: he is walking everyday for twenty minutes.  -Educated on Cholesterol goals;  Benefits of statin for ASCVD risk reduction; Importance of limiting foods high in cholesterol; Exercise goal of 150 minutes per week; -Counseled on diet and exercise extensively Recommended to continue current medication  Health Maintenance -Vaccine gaps:   - 2 nd  COVID-19 Booster Shot    -Confirmation number:  [id number]  -Recommended  to continue current medication Collaborated with patient to schedule COVID-19 booster shot.    Patient Goals/Self-Care Activities . Patient will:  - take medications as prescribed target a minimum of 150 minutes of moderate intensity exercise weekly engage in dietary modifications by increasing the amount of vegetables you are eating.   Follow Up Plan: Telephone follow up appointment with care management team member scheduled for: The patient has been provided with contact information for the care management team and has been advised to call with any health related questions or concerns.  Next PCP appointment scheduled for:  01/09/2021  Next AWV (Annual Wellness Visit) scheduled for: 01/30/2021       Patient agreed to services and verbal consent obtained.   The patient verbalized understanding of instructions, educational materials, and care plan provided today and agreed to receive a mailed copy of patient instructions, educational materials, and care plan.   Orlando Penner, PharmD Clinical Pharmacist [facility] [phone]

## 2021-01-09 ENCOUNTER — Other Ambulatory Visit: Payer: Self-pay

## 2021-01-09 ENCOUNTER — Encounter: Payer: Self-pay | Admitting: Internal Medicine

## 2021-01-09 ENCOUNTER — Ambulatory Visit (INDEPENDENT_AMBULATORY_CARE_PROVIDER_SITE_OTHER): Payer: Medicare Other | Admitting: Internal Medicine

## 2021-01-09 VITALS — BP 124/78 | HR 72 | Temp 98.7°F | Ht 72.0 in | Wt 184.8 lb

## 2021-01-09 DIAGNOSIS — R0982 Postnasal drip: Secondary | ICD-10-CM

## 2021-01-09 DIAGNOSIS — E1122 Type 2 diabetes mellitus with diabetic chronic kidney disease: Secondary | ICD-10-CM | POA: Diagnosis not present

## 2021-01-09 DIAGNOSIS — N1832 Chronic kidney disease, stage 3b: Secondary | ICD-10-CM | POA: Diagnosis not present

## 2021-01-09 DIAGNOSIS — Z23 Encounter for immunization: Secondary | ICD-10-CM | POA: Diagnosis not present

## 2021-01-09 DIAGNOSIS — I129 Hypertensive chronic kidney disease with stage 1 through stage 4 chronic kidney disease, or unspecified chronic kidney disease: Secondary | ICD-10-CM

## 2021-01-09 DIAGNOSIS — Z794 Long term (current) use of insulin: Secondary | ICD-10-CM | POA: Diagnosis not present

## 2021-01-09 DIAGNOSIS — E7800 Pure hypercholesterolemia, unspecified: Secondary | ICD-10-CM | POA: Diagnosis not present

## 2021-01-09 LAB — POC COVID19 BINAXNOW: SARS Coronavirus 2 Ag: NEGATIVE

## 2021-01-09 MED ORDER — AMLODIPINE BESYLATE 5 MG PO TABS: 5.0000 mg | ORAL_TABLET | Freq: Every day | ORAL | 1 refills | Status: DC

## 2021-01-09 NOTE — Progress Notes (Signed)
I,Katawbba Wiggins,acting as a Education administrator for Maximino Greenland, MD.,have documented all relevant documentation on the behalf of Maximino Greenland, MD,as directed by  Maximino Greenland, MD while in the presence of Maximino Greenland, MD.  This visit occurred during the SARS-CoV-2 public health emergency.  Safety protocols were in place, including screening questions prior to the visit, additional usage of staff PPE, and extensive cleaning of exam room while observing appropriate contact time as indicated for disinfecting solutions.  Subjective:     Patient ID: Alec Solis , male    DOB: March 23, 1933 , 87 y.o.   MRN: [id number]   Chief Complaint  Patient presents with  . Diabetes  . Hypertension  . Hyperlipidemia    HPI  He presents today for diabetes, blood pressure, and cholesterol follow-up. He states his sugars are well controlled. States morning sugars are usually 120-130s. He feels well on current regimen.   Hypertension This is a chronic problem. The current episode started more than 1 year ago. The problem has been gradually improving since onset. The problem is controlled. Pertinent negatives include no blurred vision, chest pain, headaches, palpitations or shortness of breath. Risk factors for coronary artery disease include diabetes mellitus, dyslipidemia, sedentary lifestyle and male gender. Identifiable causes of hypertension include chronic renal disease.  Diabetes He presents for his follow-up diabetic visit. He has type 2 diabetes mellitus. Pertinent negatives for hypoglycemia include no dizziness or headaches. Pertinent negatives for diabetes include no blurred vision, no chest pain, no fatigue, no polydipsia, no polyphagia and no polyuria. There are no hypoglycemic complications. There are no diabetic complications. He participates in exercise intermittently. Eye exam is current.  Hyperlipidemia Exacerbating diseases include chronic renal disease and diabetes. Pertinent negatives include  no chest pain or shortness of breath. Current antihyperlipidemic treatment includes statins. Compliance problems include adherence to exercise.  Risk factors for coronary artery disease include diabetes mellitus, dyslipidemia, male sex, hypertension and a sedentary lifestyle.     Past Medical History:  Diagnosis Date  . Anemia   . Anxiety   . Arthritis   . Cataract   . Chronic kidney disease   . Depression   . Diabetes mellitus without complication ([place])    type II   . GERD (gastroesophageal reflux disease)   . Gout   . Heart murmur   . Hypertension   . Myocardial infarction ([place])    minor Mi- years ago -   . Varicose veins of left lower extremity      Family History  Problem Relation Age of Onset  . Breast cancer Mother   . Stomach cancer Mother   . Diabetes Mellitus II Maternal Aunt      Current Outpatient Medications:  .  acetaminophen (TYLENOL) 650 MG CR tablet, Take 650 mg by mouth every 8 (eight) hours as needed for pain. , Disp: , Rfl:  .  allopurinol (ZYLOPRIM) 100 MG tablet, TAKE 1 TABLET BY MOUTH TWICE DAILY, Disp: 180 tablet, Rfl: 0 .  aspirin 81 MG tablet, Take 81 mg by mouth daily. , Disp: , Rfl:  .  B-D ULTRAFINE III SHORT PEN 31G X 8 MM MISC, USE AS DIRECTED FOUR TIMES DAILY, Disp: 100 each, Rfl: 3 .  calcitRIOL (ROCALTROL) 0.25 MCG capsule, Take 0.25 mcg by mouth every other day. , Disp: , Rfl:  .  carboxymethylcellulose (REFRESH PLUS) 0.5 % SOLN, Place 1 drop into both eyes 3 (three) times daily as needed (Watery eyes). , Disp: , Rfl:  .  carvedilol (COREG) 12.5 MG tablet, Take 12.5 mg by mouth 2 (two) times daily with a meal., Disp: , Rfl:  .  clopidogrel (PLAVIX) 75 MG tablet, TAKE 1 TABLET(75 MG) BY MOUTH DAILY, Disp: 90 tablet, Rfl: 2 .  ferrous sulfate 325 (65 FE) MG tablet, Take 325 mg by mouth daily with breakfast., Disp: , Rfl:  .  furosemide (LASIX) 40 MG tablet, Take 40 mg by mouth daily as needed for fluid or edema. , Disp: , Rfl:  .  glucose blood  (TRUE METRIX BLOOD GLUCOSE TEST) test strip, Use as directed to check blood sugars 3 times per day dx: e11.65, Disp: 300 each, Rfl: 5 .  insulin degludec (TRESIBA FLEXTOUCH) 200 UNIT/ML FlexTouch Pen, Inject 22 Units into the skin daily., Disp: 27 mL, Rfl: 1 .  Insulin Pen Needle 32G X 8 MM MISC, Use as directed, Disp: 100 each, Rfl: 2 .  Multiple Vitamins-Minerals (MULTIVITAMIN ADULT PO), Take 1 tablet by mouth daily. [employer] Mega men enhancement, Disp: , Rfl:  .  Multiple Vitamins-Minerals (PRESERVISION AREDS 2 PO), Take 1 tablet by mouth in the morning and at bedtime., Disp: , Rfl:  .  naproxen sodium (ALEVE) 220 MG tablet, Take 220 mg by mouth daily as needed (Body ache)., Disp: , Rfl:  .  Omega-3 Fatty Acids (FISH OIL OMEGA-3 PO), Take 1,200 mg by mouth daily. , Disp: , Rfl:  .  omeprazole (PRILOSEC) 40 MG capsule, TAKE 1 CAPSULE BY MOUTH EVERY DAY BEFORE A MEAL, Disp: 90 capsule, Rfl: 1 .  ondansetron (ZOFRAN) 4 MG tablet, TAKE 1 TABLET(4 MG) BY MOUTH DAILY AS NEEDED FOR NAUSEA OR VOMITING, Disp: 30 tablet, Rfl: 1 .  rosuvastatin (CRESTOR) 20 MG tablet, TAKE 1 TABLET BY MOUTH DAILY ON MONDAY TO FRIDAY, Disp: 75 tablet, Rfl: 1 .  sodium bicarbonate 650 MG tablet, Take 1,300 mg by mouth 2 (two) times daily., Disp: , Rfl:  .  TRUEplus Lancets 30G MISC, USE AS DIRECTED TO TEST BLOOD SUGAR THREE TIMES DAILY, Disp: 300 each, Rfl: 11 .  vitamin B-12 (CYANOCOBALAMIN) 1000 MCG tablet, Take 1,000 mcg by mouth daily., Disp: , Rfl:  .  amLODipine (NORVASC) 5 MG tablet, Take 1 tablet (5 mg total) by mouth daily., Disp: 90 tablet, Rfl: 1   No Known Allergies   Review of Systems  Constitutional: Negative.  Negative for fatigue.  HENT: Positive for postnasal drip and rhinorrhea.   Eyes: Negative for blurred vision.  Respiratory: Negative for shortness of breath.   Cardiovascular: Negative.  Negative for chest pain and palpitations.  Gastrointestinal: Negative.   Endocrine: Negative for polydipsia,  polyphagia and polyuria.  Neurological: Negative for dizziness and headaches.  Psychiatric/Behavioral: Negative.   All other systems reviewed and are negative.    Today's Vitals   01/09/21 0917  BP: 124/78  Pulse: 72  Temp: 98.7 F (37.1 C)  TempSrc: Oral  Weight: 184 lb 12.8 oz (83.8 kg)  Height: 6' (1.829 m)  PainSc: 0-No pain   Body mass index is 25.06 kg/m.  Wt Readings from Last 3 Encounters:  01/09/21 184 lb 12.8 oz (83.8 kg)  10/01/20 188 lb 12.8 oz (85.6 kg)  07/01/20 190 lb (86.2 kg)   BP Readings from Last 3 Encounters:  01/09/21 124/78  10/01/20 124/78  07/01/20 118/64   Objective:  Physical Exam Vitals and nursing note reviewed.  Constitutional:      Appearance: Normal appearance.  HENT:     Head: Normocephalic and atraumatic.  Nose:     Comments: Masked     Mouth/Throat:     Comments: Masked  Eyes:     Extraocular Movements: Extraocular movements intact.  Cardiovascular:     Rate and Rhythm: Normal rate and regular rhythm.     Heart sounds: Normal heart sounds.  Pulmonary:     Effort: Pulmonary effort is normal.     Breath sounds: Normal breath sounds.  Musculoskeletal:     Cervical back: Normal range of motion.  Skin:    General: Skin is warm.  Neurological:     General: No focal deficit present.     Mental Status: He is alert.  Psychiatric:        Mood and Affect: Mood normal.         Assessment And Plan:     1. Type 2 diabetes mellitus with stage 3b chronic kidney disease, with long-term current use of insulin (HCC) Comments: Chronic, I will check labs as listed below. Importance of dietary compliance was d/w patient.  - BMP8+EGFR - Hemoglobin A1c - CBC no Diff  2. Hypertensive nephropathy Comments: Chronic, well controlled.  I will check renal function today. Encouraged to follow low sodium diet.   3. Pure hypercholesterolemia Comments: Chronic, I will check lipid panel and LFTs. Advised to limit fried food intake, increase  exercise and to increase fiber intake.   4. Postnasal drip Comments: He agrees to COVID testing.  Rapid COVID negative. I will check PCR COVID test as well.  - Novel Coronavirus, NAA ([facility]) - POC COVID-19 - SARS-COV-2, NAA 2 DAY TAT  5. Immunization due Comments: I will send rx Prevnar-13 to his local pharmacy.    Patient was given opportunity to ask questions. Patient verbalized understanding of the plan and was able to repeat key elements of the plan. All questions were answered to their satisfaction.   I, Maximino Greenland, MD, have reviewed all documentation for this visit. The documentation on 01/09/21 for the exam, diagnosis, procedures, and orders are all accurate and complete.   IF YOU HAVE BEEN REFERRED TO A SPECIALIST, IT MAY TAKE 1-2 WEEKS TO SCHEDULE/PROCESS THE REFERRAL. IF YOU HAVE NOT HEARD FROM US/SPECIALIST IN TWO WEEKS, PLEASE GIVE [place] A CALL AT [phone].   THE PATIENT IS ENCOURAGED TO PRACTICE SOCIAL DISTANCING DUE TO THE COVID-19 PANDEMIC.

## 2021-01-09 NOTE — Patient Instructions (Signed)

## 2021-01-10 LAB — CBC
Hematocrit: 31.5 % — ABNORMAL LOW (ref 37.5–51.0)
Hemoglobin: 9.2 g/dL — ABNORMAL LOW (ref 13.0–17.7)
MCH: 22.3 pg — ABNORMAL LOW (ref 26.6–33.0)
MCHC: 29.2 g/dL — ABNORMAL LOW (ref 31.5–35.7)
MCV: 77 fL — ABNORMAL LOW (ref 79–97)
NRBC: 1 % — ABNORMAL HIGH (ref 0–0)
Platelets: 263 10*3/uL (ref 150–450)
RBC: 4.12 x10E6/uL — ABNORMAL LOW (ref 4.14–5.80)
RDW: 18.5 % — ABNORMAL HIGH (ref 11.6–15.4)
WBC: 5.7 10*3/uL (ref 3.4–10.8)

## 2021-01-10 LAB — BMP8+EGFR
BUN/Creatinine Ratio: 15 (ref 10–24)
BUN: 32 mg/dL — ABNORMAL HIGH (ref 8–27)
CO2: 21 mmol/L (ref 20–29)
Calcium: 10 mg/dL (ref 8.6–10.2)
Chloride: 107 mmol/L — ABNORMAL HIGH (ref 96–106)
Creatinine, Ser: 2.13 mg/dL — ABNORMAL HIGH (ref 0.76–1.27)
Glucose: 115 mg/dL — ABNORMAL HIGH (ref 65–99)
Potassium: 4.8 mmol/L (ref 3.5–5.2)
Sodium: 142 mmol/L (ref 134–144)
eGFR: 30 mL/min/{1.73_m2} — ABNORMAL LOW (ref >59)

## 2021-01-10 LAB — HEMOGLOBIN A1C
Est. average glucose Bld gHb Est-mCnc: 171 mg/dL
Hgb A1c MFr Bld: 7.6 % — ABNORMAL HIGH (ref 4.8–5.6)

## 2021-01-10 LAB — NOVEL CORONAVIRUS, NAA: SARS-CoV-2, NAA: NOT DETECTED

## 2021-01-10 LAB — SARS-COV-2, NAA 2 DAY TAT

## 2021-01-23 ENCOUNTER — Other Ambulatory Visit: Payer: Self-pay

## 2021-01-23 NOTE — Telephone Encounter (Signed)
Patient notified that sample of tresiba is available for pickup.

## 2021-01-30 ENCOUNTER — Other Ambulatory Visit: Payer: Self-pay | Admitting: Internal Medicine

## 2021-01-30 ENCOUNTER — Ambulatory Visit (INDEPENDENT_AMBULATORY_CARE_PROVIDER_SITE_OTHER): Payer: Medicare Other

## 2021-01-30 VITALS — Ht 72.0 in | Wt 188.0 lb

## 2021-01-30 DIAGNOSIS — Z23 Encounter for immunization: Secondary | ICD-10-CM

## 2021-01-30 DIAGNOSIS — Z0000 Encounter for general adult medical examination without abnormal findings: Secondary | ICD-10-CM

## 2021-01-30 MED ORDER — PREVNAR 20 0.5 ML IM SUSY: 0.5000 mL | PREFILLED_SYRINGE | INTRAMUSCULAR | 0 refills | Status: AC

## 2021-01-30 NOTE — Progress Notes (Signed)
I connected with Alec Solis today by telephone and verified that I am speaking with the correct person using two identifiers. Location patient: home Location provider: work Persons participating in the virtual visit: Davinder, Haff LPN.   I discussed the limitations, risks, security and privacy concerns of performing an evaluation and management service by telephone and the availability of in person appointments. I also discussed with the patient that there may be a patient responsible charge related to this service. The patient expressed understanding and verbally consented to this telephonic visit.    Interactive audio and video telecommunications were attempted between this provider and patient, however failed, due to patient having technical difficulties OR patient did not have access to video capability.  We continued and completed visit with audio only.     Vital signs may be patient reported or missing.  Subjective:   Alec Solis is a 87 y.o. male who presents for Medicare Annual/Subsequent preventive examination.  Review of Systems     Cardiac Risk Factors include: advanced age (>11men, >29 women);diabetes mellitus;male gender     Objective:    Today's Vitals   01/30/21 0858  Weight: 188 lb (85.3 kg)  Height: 6' (1.829 m)   Body mass index is 25.5 kg/m.  Advanced Directives 01/30/2021 03/29/2020 05/23/2019 05/28/2017 05/27/2017 06/11/2016 06/02/2016  Does Patient Have a Medical Advance Directive? No No No No No No No  Copy of Healthcare Power of Attorney in Chart? - - - - No - copy requested - -  Would patient like information on creating a medical advance directive? - No - Patient declined - No - Patient declined - No - patient declined information No - patient declined information    Current Medications (verified) Outpatient Encounter Medications as of 01/30/2021  Medication Sig   acetaminophen (TYLENOL) 650 MG CR tablet Take 650 mg by mouth every 8 (eight)  hours as needed for pain.    allopurinol (ZYLOPRIM) 100 MG tablet TAKE 1 TABLET BY MOUTH TWICE DAILY   amLODipine (NORVASC) 5 MG tablet Take 1 tablet (5 mg total) by mouth daily.   B-D ULTRAFINE III SHORT PEN 31G X 8 MM MISC USE AS DIRECTED FOUR TIMES DAILY   calcitRIOL (ROCALTROL) 0.25 MCG capsule Take 0.25 mcg by mouth every other day.    carboxymethylcellulose (REFRESH PLUS) 0.5 % SOLN Place 1 drop into both eyes 3 (three) times daily as needed (Watery eyes).    carvedilol (COREG) 12.5 MG tablet Take 12.5 mg by mouth 2 (two) times daily with a meal.   clopidogrel (PLAVIX) 75 MG tablet TAKE 1 TABLET(75 MG) BY MOUTH DAILY   ferrous sulfate 325 (65 FE) MG tablet Take 325 mg by mouth daily with breakfast.   furosemide (LASIX) 40 MG tablet Take 40 mg by mouth daily as needed for fluid or edema.    glucose blood (TRUE METRIX BLOOD GLUCOSE TEST) test strip Use as directed to check blood sugars 3 times per day dx: e11.65   insulin degludec (TRESIBA FLEXTOUCH) 200 UNIT/ML FlexTouch Pen Inject 22 Units into the skin daily.   Insulin Pen Needle 32G X 8 MM MISC Use as directed   Multiple Vitamins-Minerals (MULTIVITAMIN ADULT PO) Take 1 tablet by mouth daily. [employer] Mega men enhancement   Multiple Vitamins-Minerals (PRESERVISION AREDS 2 PO) Take 1 tablet by mouth in the morning and at bedtime.   naproxen sodium (ALEVE) 220 MG tablet Take 220 mg by mouth daily as needed (Body ache).   Omega-3 Fatty  Acids (FISH OIL OMEGA-3 PO) Take 1,200 mg by mouth daily.    omeprazole (PRILOSEC) 40 MG capsule TAKE 1 CAPSULE BY MOUTH EVERY DAY BEFORE A MEAL   ondansetron (ZOFRAN) 4 MG tablet TAKE 1 TABLET(4 MG) BY MOUTH DAILY AS NEEDED FOR NAUSEA OR VOMITING   pneumococcal 20-Val Conj Vacc (PREVNAR 20) 0.5 ML SUSY Inject 0.5 mLs into the muscle tomorrow at 10 am for 1 dose.   rosuvastatin (CRESTOR) 20 MG tablet TAKE 1 TABLET BY MOUTH DAILY ON MONDAY TO FRIDAY   sodium bicarbonate 650 MG tablet Take 1,300 mg by mouth 2 (two)  times daily.   TRUEplus Lancets 30G MISC USE AS DIRECTED TO TEST BLOOD SUGAR THREE TIMES DAILY   vitamin B-12 (CYANOCOBALAMIN) 1000 MCG tablet Take 1,000 mcg by mouth daily.   aspirin 81 MG tablet Take 81 mg by mouth daily.  (Patient not taking: Reported on 01/30/2021)   No facility-administered encounter medications on file as of 01/30/2021.    Allergies (verified) Patient has no known allergies.   History: Past Medical History:  Diagnosis Date   Anemia    Anxiety    Arthritis    Cataract    Chronic kidney disease    Depression    Diabetes mellitus without complication (HCC)    type II    GERD (gastroesophageal reflux disease)    Gout    Heart murmur    Hypertension    Myocardial infarction ([place])    minor Mi- years ago -    Varicose veins of left lower extremity    Past Surgical History:  Procedure Laterality Date   BUNIONECTOMY     cataract     COLONOSCOPY WITH PROPOFOL N/A 05/28/2017   Procedure: COLONOSCOPY WITH PROPOFOL;  Surgeon: Carol Ada, MD;  Location: [facility];  Service: Endoscopy;  Laterality: N/A;   COLONOSCOPY WITH PROPOFOL N/A 03/29/2020   Procedure: COLONOSCOPY WITH PROPOFOL;  Surgeon: Carol Ada, MD;  Location: [facility];  Service: Endoscopy;  Laterality: N/A;   ENTEROSCOPY N/A 03/29/2020   Procedure: ENTEROSCOPY;  Surgeon: Carol Ada, MD;  Location: [facility];  Service: Endoscopy;  Laterality: N/A;   ESOPHAGOGASTRODUODENOSCOPY (EGD) WITH PROPOFOL N/A 05/28/2017   Procedure: ESOPHAGOGASTRODUODENOSCOPY (EGD) WITH PROPOFOL;  Surgeon: Carol Ada, MD;  Location: [facility];  Service: Endoscopy;  Laterality: N/A;   HEMOSTASIS CLIP PLACEMENT  03/29/2020   Procedure: HEMOSTASIS CLIP PLACEMENT;  Surgeon: Carol Ada, MD;  Location: [facility];  Service: Endoscopy;;   HOT HEMOSTASIS N/A 03/29/2020   Procedure: HOT HEMOSTASIS (ARGON PLASMA COAGULATION/BICAP);  Surgeon: Carol Ada, MD;  Location: [facility];  Service: Endoscopy;  Laterality: N/A;    POLYPECTOMY  03/29/2020   Procedure: POLYPECTOMY;  Surgeon: Carol Ada, MD;  Location: [facility];  Service: Endoscopy;;   Family History  Problem Relation Age of Onset   Breast cancer Mother    Stomach cancer Mother    Diabetes Mellitus II Maternal Aunt    Social History   Socioeconomic History   Marital status: Married    Spouse name: Not on file   Number of children: Not on file   Years of education: Not on file   Highest education level: Not on file  Occupational History   Occupation: retired  Tobacco Use   Smoking status: Former    Packs/day: 0.50    Years: 50.00    Pack years: 25.00    Types: Cigarettes    Start date: 08/24/1952    Quit date: 08/24/2014    Years  since quitting: 6.4   Smokeless tobacco: Never  Vaping Use   Vaping Use: Never used  Substance and Sexual Activity   Alcohol use: Yes    Comment: occassional wine beer   Drug use: No   Sexual activity: Not Currently  Other Topics Concern   Not on file  Social History Narrative   Not on file   Social Determinants of Health   Financial Resource Strain: Low Risk    Difficulty of Paying Living Expenses: Not hard at all  Food Insecurity: No Food Insecurity   Worried About Charity fundraiser in the Last Year: Never true   [place] in the Last Year: Never true  Transportation Needs: No Transportation Needs   Lack of Transportation (Medical): No   Lack of Transportation (Non-Medical): No  Physical Activity: Sufficiently Active   Days of Exercise per Week: 6 days   Minutes of Exercise per Session: 30 min  Stress: No Stress Concern Present   Feeling of Stress : Not at all  Social Connections: Not on file    Tobacco Counseling Counseling given: Not Answered   Clinical Intake:  Pre-visit preparation completed: Yes  Pain : No/denies pain     Nutritional Status: BMI 25 -29 Overweight Nutritional Risks: Nausea/ vomitting/ diarrhea (a little nausea) Diabetes: Yes  How often do you  need to have someone help you when you read instructions, pamphlets, or other written materials from your doctor or pharmacy?: 1 - Never What is the last grade level you completed in school?: 12th grade  Diabetic?yes Nutrition Risk Assessment:  Has the patient had any N/V/D within the last 2 months?  Yes  Does the patient have any non-healing wounds?  No  Has the patient had any unintentional weight loss or weight gain?  No   Diabetes:  Is the patient diabetic?  Yes  If diabetic, was a CBG obtained today?  No  Did the patient bring in their glucometer from home?  No  How often do you monitor your CBG's? Twice daily.   Financial Strains and Diabetes Management:  Are you having any financial strains with the device, your supplies or your medication? No .  Does the patient want to be seen by Chronic Care Management for management of their diabetes?  No  Would the patient like to be referred to a Nutritionist or for Diabetic Management?  No   Diabetic Exams:  Diabetic Eye Exam: Completed 03/22/2020 Diabetic Foot Exam: Overdue, Pt has been advised about the importance in completing this exam. Pt is scheduled for diabetic foot exam on next appointment.   Interpreter Needed?: No  Information entered by :: NAllen LPN   Activities of Daily Living In your present state of health, do you have any difficulty performing the following activities: 01/30/2021 07/01/2020  Hearing? N N  Vision? Y N  Comment blurry sometimes, double vision at times -  Difficulty concentrating or making decisions? Y N  Walking or climbing stairs? N N  Dressing or bathing? N N  Doing errands, shopping? N N  Preparing Food and eating ? N -  Using the Toilet? N -  In the past six months, have you accidently leaked urine? N -  Do you have problems with loss of bowel control? N -  Managing your Medications? N -  Managing your Finances? N -  Housekeeping or managing your Housekeeping? N -  Some recent data might  be hidden    Patient Care Team:  [name], MD as PCP - General (Internal Medicine) Mayford Knife, [facility] (Pharmacist)  Indicate any recent Medical Services you may have received from other than [facility] providers in the past year (date may be approximate).     Assessment:   This is a routine wellness examination for Maahir.  Hearing/Vision screen Vision Screening - Comments:: Regular eye exams, Dr. Herbert Deaner  Dietary issues and exercise activities discussed: Current Exercise Habits: Home exercise routine, Type of exercise: walking, Time (Minutes): 30, Frequency (Times/Week): 6, Weekly Exercise (Minutes/Week): 180   Goals Addressed             This Visit's Progress    Patient Stated       01/30/2021, no goals        Depression Screen PHQ 2/9 Scores 01/30/2021 09/04/2020 07/01/2020 05/23/2019 01/17/2019 10/18/2018 06/14/2018  PHQ - 2 Score 0 4 2 1  0 0 0  PHQ- 9 Score - 10 12 1  - - -    Fall Risk Fall Risk  01/30/2021 01/09/2021 05/23/2019 01/18/2019 01/17/2019  Falls in the past year? 0 0 0 1 1  Comment - - - - -  Number falls in past yr: - - - - 0  Comment - - - - -  Injury with Fall? - - - - 1  Risk for fall due to : Medication side effect - Medication side effect - -  Follow up Falls evaluation completed;Education provided;Falls prevention discussed - Falls evaluation completed;Falls prevention discussed - -    FALL RISK PREVENTION PERTAINING TO THE HOME:  Any stairs in or around the home? No  If so, are there any without handrails? Yes  Home free of loose throw rugs in walkways, pet beds, electrical cords, etc? Yes  Adequate lighting in your home to reduce risk of falls? Yes   ASSISTIVE DEVICES UTILIZED TO PREVENT FALLS:  Life alert? No  Use of a cane, walker or w/c? No  Grab bars in the bathroom? No  Shower chair or bench in shower? No  Elevated toilet seat or a handicapped toilet? No   TIMED UP AND GO:  Was the test performed? No .      Cognitive Function:      6CIT Screen 01/30/2021 05/23/2019  What Year? 0 points 0 points  What month? 0 points 0 points  What time? 3 points 0 points  Count back from 20 0 points 0 points  Months in reverse 0 points 2 points  Repeat phrase 2 points 0 points  Total Score 5 2    Immunizations Immunization History  Administered Date(s) Administered   Influenza, High Dose Seasonal PF 05/25/2018, 04/19/2019   Influenza-Unspecified 05/24/2013, 05/31/2018   PFIZER(Purple Top)SARS-COV-2 Vaccination 10/23/2019, 11/15/2019, 09/12/2020   Pneumococcal Polysaccharide-23 05/24/2013, 05/24/2017    TDAP status: Up to date  Flu Vaccine status: Up to date  Pneumococcal vaccine status: Up to date  Covid-19 vaccine status: Completed vaccines  Qualifies for Shingles Vaccine? Yes   Zostavax completed No   Shingrix Completed?: No.    Education has been provided regarding the importance of this vaccine. Patient has been advised to call insurance company to determine out of pocket expense if they have not yet received this vaccine. Advised may also receive vaccine at local pharmacy or Health Dept. Verbalized acceptance and understanding.  Screening Tests Health Maintenance  Topic Date Due   Pneumococcal Vaccine 34-66 Years old (1 - PCV) Never done   PNA vac Low Risk Adult (2 of 2 -  PCV13) 05/24/2018   COVID-19 Vaccine (4 - Booster for Pfizer series) 12/11/2020   URINE MICROALBUMIN  02/19/2021   Zoster Vaccines- Shingrix (1 of 2) 05/02/2021 (Originally 05/30/1952)   OPHTHALMOLOGY EXAM  03/22/2021   INFLUENZA VACCINE  03/24/2021   HEMOGLOBIN A1C  07/12/2021   FOOT EXAM  12/16/2021   TETANUS/TDAP  05/23/2023   HPV VACCINES  Aged Out    Health Maintenance  Health Maintenance Due  Topic Date Due   Pneumococcal Vaccine 44-60 Years old (1 - PCV) Never done   PNA vac Low Risk Adult (2 of 2 - PCV13) 05/24/2018   COVID-19 Vaccine (4 - Booster for Pfizer series) 12/11/2020   URINE MICROALBUMIN  02/19/2021    Colorectal  cancer screening: No longer required.   Lung Cancer Screening: (Low Dose CT Chest recommended if Age 37-80 years, 30 pack-year currently smoking OR have quit w/in 15years.) does not qualify.   Lung Cancer Screening Referral: no  Additional Screening:  Hepatitis C Screening: does not qualify;   Vision Screening: Recommended annual ophthalmology exams for early detection of glaucoma and other disorders of the eye. Is the patient up to date with their annual eye exam?  Yes  Who is the provider or what is the name of the office in which the patient attends annual eye exams? Dr. Herbert Deaner  If pt is not established with a provider, would they like to be referred to a provider to establish care? No .   Dental Screening: Recommended annual dental exams for proper oral hygiene  Community Resource Referral / Chronic Care Management: CRR required this visit?  No   CCM required this visit?  No      Plan:     I have personally reviewed and noted the following in the patient's chart:   Medical and social history Use of alcohol, tobacco or illicit drugs  Current medications and supplements including opioid prescriptions. Patient is not currently taking opioid prescriptions. Functional ability and status Nutritional status Physical activity Advanced directives List of other physicians Hospitalizations, surgeries, and ER visits in previous 12 months Vitals Screenings to include cognitive, depression, and falls Referrals and appointments  In addition, I have reviewed and discussed with patient certain preventive protocols, quality metrics, and best practice recommendations. A written personalized care plan for preventive services as well as general preventive health recommendations were provided to patient.     Kellie Simmering, LPN   09/30/9483   Nurse Notes:

## 2021-01-30 NOTE — Patient Instructions (Signed)
Alec Solis , Thank you for taking time to come for your Medicare Wellness Visit. I appreciate your ongoing commitment to your health goals. Please review the following plan we discussed and let me know if I can assist you in the future.   Screening recommendations/referrals: Colonoscopy: not required Recommended yearly ophthalmology/optometry visit for glaucoma screening and checkup Recommended yearly dental visit for hygiene and checkup  Vaccinations: Influenza vaccine: completed 05/25/2020, due 03/24/2021 Pneumococcal vaccine: sent to pharmacy Tdap vaccine: completed 05/22/2013 Shingles vaccine: discussed   Covid-19:  09/12/2020, 11/15/2019, 10/23/2019  Advanced directives: Advance directive discussed with you today.    Conditions/risks identified: none  Next appointment: Follow up in one year for your annual wellness visit.   Preventive Care 19 Years and Older, Male Preventive care refers to lifestyle choices and visits with your health care provider that can promote health and wellness. What does preventive care include? A yearly physical exam. This is also called an annual well check. Dental exams once or twice a year. Routine eye exams. Ask your health care provider how often you should have your eyes checked. Personal lifestyle choices, including: Daily care of your teeth and gums. Regular physical activity. Eating a healthy diet. Avoiding tobacco and drug use. Limiting alcohol use. Practicing safe sex. Taking low doses of aspirin every day. Taking vitamin and mineral supplements as recommended by your health care provider. What happens during an annual well check? The services and screenings done by your health care provider during your annual well check will depend on your age, overall health, lifestyle risk factors, and family history of disease. Counseling  Your health care provider may ask you questions about your: Alcohol use. Tobacco use. Drug use. Emotional  well-being. Home and relationship well-being. Sexual activity. Eating habits. History of falls. Memory and ability to understand (cognition). Work and work Statistician. Screening  You may have the following tests or measurements: Height, weight, and BMI. Blood pressure. Lipid and cholesterol levels. These may be checked every 5 years, or more frequently if you are over 17 years old. Skin check. Lung cancer screening. You may have this screening every year starting at age 54 if you have a 30-pack-year history of smoking and currently smoke or have quit within the past 15 years. Fecal occult blood test (FOBT) of the stool. You may have this test every year starting at age 72. Flexible sigmoidoscopy or colonoscopy. You may have a sigmoidoscopy every 5 years or a colonoscopy every 10 years starting at age 24. Prostate cancer screening. Recommendations will vary depending on your family history and other risks. Hepatitis C blood test. Hepatitis B blood test. Sexually transmitted disease (STD) testing. Diabetes screening. This is done by checking your blood sugar (glucose) after you have not eaten for a while (fasting). You may have this done every 1-3 years. Abdominal aortic aneurysm (AAA) screening. You may need this if you are a current or former smoker. Osteoporosis. You may be screened starting at age 26 if you are at high risk. Talk with your health care provider about your test results, treatment options, and if necessary, the need for more tests. Vaccines  Your health care provider may recommend certain vaccines, such as: Influenza vaccine. This is recommended every year. Tetanus, diphtheria, and acellular pertussis (Tdap, Td) vaccine. You may need a Td booster every 10 years. Zoster vaccine. You may need this after age 15. Pneumococcal 13-valent conjugate (PCV13) vaccine. One dose is recommended after age 37. Pneumococcal polysaccharide (PPSV23) vaccine. One dose  is recommended after  age 66. Talk to your health care provider about which screenings and vaccines you need and how often you need them. This information is not intended to replace advice given to you by your health care provider. Make sure you discuss any questions you have with your health care provider. Document Released: 09/06/2015 Document Revised: 04/29/2016 Document Reviewed: 06/11/2015 Elsevier Interactive Patient Education  2017 [place] Prevention in the Home Falls can cause injuries. They can happen to people of all ages. There are many things you can do to make your home safe and to help prevent falls. What can I do on the outside of my home? Regularly fix the edges of walkways and driveways and fix any cracks. Remove anything that might make you trip as you walk through a door, such as a raised step or threshold. Trim any bushes or trees on the path to your home. Use bright outdoor lighting. Clear any walking paths of anything that might make someone trip, such as rocks or tools. Regularly check to see if handrails are loose or broken. Make sure that both sides of any steps have handrails. Any raised decks and porches should have guardrails on the edges. Have any leaves, snow, or ice cleared regularly. Use sand or salt on walking paths during winter. Clean up any spills in your garage right away. This includes oil or grease spills. What can I do in the bathroom? Use night lights. Install grab bars by the toilet and in the tub and shower. Do not use towel bars as grab bars. Use non-skid mats or decals in the tub or shower. If you need to sit down in the shower, use a plastic, non-slip stool. Keep the floor dry. Clean up any water that spills on the floor as soon as it happens. Remove soap buildup in the tub or shower regularly. Attach bath mats securely with double-sided non-slip rug tape. Do not have throw rugs and other things on the floor that can make you trip. What can I do in the  bedroom? Use night lights. Make sure that you have a light by your bed that is easy to reach. Do not use any sheets or blankets that are too big for your bed. They should not hang down onto the floor. Have a firm chair that has side arms. You can use this for support while you get dressed. Do not have throw rugs and other things on the floor that can make you trip. What can I do in the kitchen? Clean up any spills right away. Avoid walking on wet floors. Keep items that you use a lot in easy-to-reach places. If you need to reach something above you, use a strong step stool that has a grab bar. Keep electrical cords out of the way. Do not use floor polish or wax that makes floors slippery. If you must use wax, use non-skid floor wax. Do not have throw rugs and other things on the floor that can make you trip. What can I do with my stairs? Do not leave any items on the stairs. Make sure that there are handrails on both sides of the stairs and use them. Fix handrails that are broken or loose. Make sure that handrails are as long as the stairways. Check any carpeting to make sure that it is firmly attached to the stairs. Fix any carpet that is loose or worn. Avoid having throw rugs at the top or bottom of the stairs.  If you do have throw rugs, attach them to the floor with carpet tape. Make sure that you have a light switch at the top of the stairs and the bottom of the stairs. If you do not have them, ask someone to add them for you. What else can I do to help prevent falls? Wear shoes that: Do not have high heels. Have rubber bottoms. Are comfortable and fit you well. Are closed at the toe. Do not wear sandals. If you use a stepladder: Make sure that it is fully opened. Do not climb a closed stepladder. Make sure that both sides of the stepladder are locked into place. Ask someone to hold it for you, if possible. Clearly mark and make sure that you can see: Any grab bars or  handrails. First and last steps. Where the edge of each step is. Use tools that help you move around (mobility aids) if they are needed. These include: Canes. Walkers. Scooters. Crutches. Turn on the lights when you go into a dark area. Replace any light bulbs as soon as they burn out. Set up your furniture so you have a clear path. Avoid moving your furniture around. If any of your floors are uneven, fix them. If there are any pets around you, be aware of where they are. Review your medicines with your doctor. Some medicines can make you feel dizzy. This can increase your chance of falling. Ask your doctor what other things that you can do to help prevent falls. This information is not intended to replace advice given to you by your health care provider. Make sure you discuss any questions you have with your health care provider. Document Released: 06/06/2009 Document Revised: 01/16/2016 Document Reviewed: 09/14/2014 Elsevier Interactive Patient Education  2017 [employer].

## 2021-02-19 DIAGNOSIS — Z23 Encounter for immunization: Secondary | ICD-10-CM | POA: Diagnosis not present

## 2021-02-19 DIAGNOSIS — U071 COVID-19: Secondary | ICD-10-CM | POA: Diagnosis not present

## 2021-02-25 ENCOUNTER — Encounter (HOSPITAL_COMMUNITY): Payer: Self-pay | Admitting: *Deleted

## 2021-03-03 ENCOUNTER — Telehealth: Payer: Self-pay

## 2021-03-03 NOTE — Chronic Care Management (AMB) (Signed)
    Patient aware of telephone appointment with Orlando Penner CPP on 03-04-2021 at 10:00.  Patient aware to have/bring all medications, supplements, blood pressure and/or blood sugar logs to visit.  Questions: Have you had any recent office visit or specialist visit outside of [place]? Patient stated no  Are there any concerns you would like to discuss during your office visit? Patient stated no.  Are you having any problems obtaining your medications? (Whether it pharmacy issues or cost) Patient stated no.   Star Rating Drug: Rosuvastatin 20 mg- Last filled 12-11-2020 90 DS [facility]  Any gaps in medications fill history?   [place] Pharmacist Assistant [phone]

## 2021-03-04 ENCOUNTER — Telehealth: Payer: Self-pay

## 2021-03-06 ENCOUNTER — Other Ambulatory Visit: Payer: Self-pay | Admitting: Internal Medicine

## 2021-03-12 ENCOUNTER — Encounter (HOSPITAL_COMMUNITY): Payer: Self-pay | Admitting: *Deleted

## 2021-03-13 ENCOUNTER — Encounter (HOSPITAL_COMMUNITY): Payer: Self-pay | Admitting: *Deleted

## 2021-03-17 ENCOUNTER — Ambulatory Visit (INDEPENDENT_AMBULATORY_CARE_PROVIDER_SITE_OTHER): Payer: Medicare Other | Admitting: Podiatry

## 2021-03-17 ENCOUNTER — Encounter: Payer: Self-pay | Admitting: Podiatry

## 2021-03-17 ENCOUNTER — Other Ambulatory Visit: Payer: Self-pay

## 2021-03-17 DIAGNOSIS — M79674 Pain in right toe(s): Secondary | ICD-10-CM | POA: Diagnosis not present

## 2021-03-17 DIAGNOSIS — Q828 Other specified congenital malformations of skin: Secondary | ICD-10-CM

## 2021-03-17 DIAGNOSIS — N183 Chronic kidney disease, stage 3 unspecified: Secondary | ICD-10-CM

## 2021-03-17 DIAGNOSIS — B351 Tinea unguium: Secondary | ICD-10-CM

## 2021-03-17 DIAGNOSIS — M79675 Pain in left toe(s): Secondary | ICD-10-CM

## 2021-03-17 DIAGNOSIS — L84 Corns and callosities: Secondary | ICD-10-CM | POA: Diagnosis not present

## 2021-03-17 DIAGNOSIS — E1122 Type 2 diabetes mellitus with diabetic chronic kidney disease: Secondary | ICD-10-CM | POA: Diagnosis not present

## 2021-03-20 DIAGNOSIS — D509 Iron deficiency anemia, unspecified: Secondary | ICD-10-CM | POA: Insufficient documentation

## 2021-03-20 DIAGNOSIS — R1013 Epigastric pain: Secondary | ICD-10-CM | POA: Insufficient documentation

## 2021-03-20 DIAGNOSIS — R197 Diarrhea, unspecified: Secondary | ICD-10-CM | POA: Insufficient documentation

## 2021-03-20 DIAGNOSIS — R112 Nausea with vomiting, unspecified: Secondary | ICD-10-CM | POA: Insufficient documentation

## 2021-03-20 DIAGNOSIS — R1310 Dysphagia, unspecified: Secondary | ICD-10-CM | POA: Insufficient documentation

## 2021-03-20 DIAGNOSIS — K5730 Diverticulosis of large intestine without perforation or abscess without bleeding: Secondary | ICD-10-CM | POA: Insufficient documentation

## 2021-03-20 DIAGNOSIS — R634 Abnormal weight loss: Secondary | ICD-10-CM | POA: Insufficient documentation

## 2021-03-20 DIAGNOSIS — K625 Hemorrhage of anus and rectum: Secondary | ICD-10-CM | POA: Insufficient documentation

## 2021-03-20 DIAGNOSIS — R195 Other fecal abnormalities: Secondary | ICD-10-CM | POA: Insufficient documentation

## 2021-03-20 NOTE — Progress Notes (Signed)
  Subjective:  Patient ID: Alec Solis, male    DOB: 1932-10-15,  MRN: [id number]  Alec Solis presents to clinic today for at risk foot care. Pt has h/o NIDDM with chronic kidney disease, corn(s) left 5th digit , callus(es) b/l  and painful mycotic nails.  Pain interferes with ambulation. Aggravating factors include wearing enclosed shoe gear. Painful toenails interfere with ambulation. Aggravating factors include wearing enclosed shoe gear. Pain is relieved with periodic professional debridement. Painful corns and calluses are aggravated when weightbearing with and without shoegear. Pain is relieved with periodic professional debridement. He also has painful porokeratotic lesions left foot.  Pain prevent comfortable ambulation. Aggravating factor is weightbearing with or without shoegear.  Patient states blood glucose was 84 mg/dl this morning.  PCP is [name], MD , and last visit was 01/09/2021.  No Known Allergies  Review of Systems: Negative except as noted in the HPI. Objective:   Constitutional [name] is a pleasant 87 y.o. African American male, in NAD. AAO x 3.   Vascular Capillary refill time to digits immediate b/l. Palpable pedal pulses b/l LE. Pedal hair absent. Lower extremity skin temperature gradient within normal limits. No pain with calf compression b/l. No cyanosis or clubbing noted.  Neurologic Normal speech. Oriented to person, place, and time. Protective sensation intact 5/5 intact bilaterally with 10g monofilament b/l. Vibratory sensation intact b/l.  Dermatologic Pedal skin with normal turgor, texture and tone b/l lower extremities. No open wounds b/l lower extremities. No interdigital macerations b/l lower extremities. Toenails 1-5 b/l elongated, discolored, dystrophic, thickened, crumbly with subungual debris and tenderness to dorsal palpation. Hyperkeratotic lesion(s) L hallux, L 5th toe, and R hallux.  No erythema, no edema, no drainage, no fluctuance.  Porokeratotic lesion(s) submet head 3 left foot. No erythema, no edema, no drainage, no fluctuance.  Orthopedic: Normal muscle strength 5/5 to all lower extremity muscle groups bilaterally. No pain crepitus or joint limitation noted with ROM b/l. Hallux valgus with bunion deformity noted b/l lower extremities. Hammertoe(s) noted to the 2-5 bilaterally.   Radiographs: None Assessment:   1. Pain due to onychomycosis of toenails of both feet   2. Corns and callosities   3. Porokeratosis   4. Diabetes mellitus with stage 3 chronic kidney disease ([place])    Plan:  -Examined patient. -Patient to continue soft, supportive shoe gear daily. -Toenails 1-5 b/l were debrided in length and girth with sterile nail nippers and dremel without iatrogenic bleeding.  -Corn(s) L 5th toe and callus(es) L hallux and R hallux were pared utilizing sterile scalpel blade without incident. Total number debrided =3. -Painful porokeratotic lesion(s) submet head 3 left foot pared and enucleated with sterile scalpel blade without incident. Total number of lesions debrided=1. -Patient to report any pedal injuries to medical professional immediately. -Patient/POA to call should there be question/concern in the interim.  Return in about 3 months (around 06/17/2021).  [name], [name]

## 2021-03-31 ENCOUNTER — Encounter: Payer: Medicare Other | Admitting: Internal Medicine

## 2021-03-31 NOTE — Progress Notes (Deleted)
I,Carmin Dibartolo [employer] as a Education administrator for Maximino Greenland, MD.,have documented all relevant documentation on the behalf of Maximino Greenland, MD,as directed by  Maximino Greenland, MD while in the presence of Maximino Greenland, MD.  This visit occurred during the SARS-CoV-2 public health emergency.  Safety protocols were in place, including screening questions prior to the visit, additional usage of staff PPE, and extensive cleaning of exam room while observing appropriate contact time as indicated for disinfecting solutions.  Subjective:     Patient ID: Alec Solis , male    DOB: 03-06-1933 , 87 y.o.   MRN: [id number]   No chief complaint on file.   HPI  He presents today for a diabetes f/u  Diabetes He presents for his follow-up diabetic visit. He has type 2 diabetes mellitus. Pertinent negatives for hypoglycemia include no dizziness or headaches. Pertinent negatives for diabetes include no blurred vision, no chest pain, no fatigue, no polydipsia, no polyphagia and no polyuria. There are no hypoglycemic complications. There are no diabetic complications. He participates in exercise intermittently. Eye exam is current.  Hypertension This is a chronic problem. The current episode started more than 1 year ago. The problem has been gradually improving since onset. The problem is controlled. Pertinent negatives include no blurred vision, chest pain, headaches, palpitations or shortness of breath. Risk factors for coronary artery disease include diabetes mellitus, dyslipidemia, sedentary lifestyle and male gender. Identifiable causes of hypertension include chronic renal disease.  Hyperlipidemia Exacerbating diseases include chronic renal disease and diabetes. Pertinent negatives include no chest pain or shortness of breath. Current antihyperlipidemic treatment includes statins. Compliance problems include adherence to exercise.  Risk factors for coronary artery disease include diabetes mellitus,  dyslipidemia, male sex, hypertension and a sedentary lifestyle.    Past Medical History:  Diagnosis Date   Anemia    Anxiety    Arthritis    Cataract    Chronic kidney disease    Depression    Diabetes mellitus without complication (HCC)    type II    GERD (gastroesophageal reflux disease)    Gout    Heart murmur    Hypertension    Myocardial infarction ([place])    minor Mi- years ago -    Varicose veins of left lower extremity      Family History  Problem Relation Age of Onset   Breast cancer Mother    Stomach cancer Mother    Diabetes Mellitus II Maternal Aunt      Current Outpatient Medications:    acetaminophen (TYLENOL) 650 MG CR tablet, Take 650 mg by mouth every 8 (eight) hours as needed for pain. , Disp: , Rfl:    allopurinol (ZYLOPRIM) 100 MG tablet, TAKE 1 TABLET BY MOUTH TWICE DAILY, Disp: 180 tablet, Rfl: 0   amLODipine (NORVASC) 5 MG tablet, Take 1 tablet (5 mg total) by mouth daily., Disp: 90 tablet, Rfl: 1   aspirin 81 MG tablet, Take 81 mg by mouth daily.  (Patient not taking: Reported on 01/30/2021), Disp: , Rfl:    B-D ULTRAFINE III SHORT PEN 31G X 8 MM MISC, USE AS DIRECTED FOUR TIMES DAILY, Disp: 100 each, Rfl: 3   BINAXNOW COVID-19 AG HOME TEST KIT, AS DIRECTED PER PACKAGE INSTRUCTIONS, Disp: , Rfl:    calcitRIOL (ROCALTROL) 0.25 MCG capsule, Take 0.25 mcg by mouth every other day. , Disp: , Rfl:    carboxymethylcellulose (REFRESH PLUS) 0.5 % SOLN, Place 1 drop into both eyes 3 (three) times  daily as needed (Watery eyes). , Disp: , Rfl:    carvedilol (COREG) 12.5 MG tablet, Take 12.5 mg by mouth 2 (two) times daily with a meal., Disp: , Rfl:    clopidogrel (PLAVIX) 75 MG tablet, TAKE 1 TABLET(75 MG) BY MOUTH DAILY, Disp: 90 tablet, Rfl: 2   ferrous sulfate 325 (65 FE) MG tablet, Take 325 mg by mouth daily with breakfast., Disp: , Rfl:    furosemide (LASIX) 40 MG tablet, Take 40 mg by mouth daily as needed for fluid or edema. , Disp: , Rfl:    glucose blood  (TRUE METRIX BLOOD GLUCOSE TEST) test strip, Use as directed to check blood sugars 3 times per day dx: e11.65, Disp: 300 each, Rfl: 5   insulin degludec (TRESIBA FLEXTOUCH) 200 UNIT/ML FlexTouch Pen, Inject 22 Units into the skin daily., Disp: 27 mL, Rfl: 1   Insulin Pen Needle 32G X 8 MM MISC, Use as directed, Disp: 100 each, Rfl: 2   Multiple Vitamins-Minerals (MULTIVITAMIN ADULT PO), Take 1 tablet by mouth daily. [employer] Mega men enhancement, Disp: , Rfl:    Multiple Vitamins-Minerals (PRESERVISION AREDS 2 PO), Take 1 tablet by mouth in the morning and at bedtime., Disp: , Rfl:    naproxen sodium (ALEVE) 220 MG tablet, Take 220 mg by mouth daily as needed (Body ache)., Disp: , Rfl:    Omega-3 Fatty Acids (FISH OIL OMEGA-3 PO), Take 1,200 mg by mouth daily. , Disp: , Rfl:    omeprazole (PRILOSEC) 40 MG capsule, TAKE 1 CAPSULE BY MOUTH EVERY DAY BEFORE A MEAL, Disp: 90 capsule, Rfl: 1   ondansetron (ZOFRAN) 4 MG tablet, TAKE 1 TABLET(4 MG) BY MOUTH DAILY AS NEEDED FOR NAUSEA OR VOMITING, Disp: 30 tablet, Rfl: 1   rosuvastatin (CRESTOR) 20 MG tablet, TAKE 1 TABLET BY MOUTH DAILY ON MONDAY TO FRIDAY, Disp: 75 tablet, Rfl: 1   sodium bicarbonate 650 MG tablet, Take 1,300 mg by mouth 2 (two) times daily., Disp: , Rfl:    TRUEplus Lancets 30G MISC, USE AS DIRECTED TO TEST BLOOD SUGAR THREE TIMES DAILY, Disp: 300 each, Rfl: 11   vitamin B-12 (CYANOCOBALAMIN) 1000 MCG tablet, Take 1,000 mcg by mouth daily., Disp: , Rfl:    No Known Allergies   Review of Systems  Constitutional:  Negative for fatigue.  Eyes:  Negative for blurred vision.  Respiratory:  Negative for shortness of breath.   Cardiovascular:  Negative for chest pain and palpitations.  Endocrine: Negative for polydipsia, polyphagia and polyuria.  Neurological:  Negative for dizziness and headaches.    There were no vitals filed for this visit. There is no height or weight on file to calculate BMI.   Objective:  Physical Exam       Assessment And Plan:     1. Type 2 diabetes mellitus with stage 3b chronic kidney disease, with long-term current use of insulin ([place])     Patient was given opportunity to ask questions. Patient verbalized understanding of the plan and was able to repeat key elements of the plan. All questions were answered to their satisfaction.  [address] [place], CMA   I, [place], [place], have reviewed all documentation for this visit. The documentation on 03/31/21 for the exam, diagnosis, procedures, and orders are all accurate and complete.   IF YOU HAVE BEEN REFERRED TO A SPECIALIST, IT MAY TAKE 1-2 WEEKS TO SCHEDULE/PROCESS THE REFERRAL. IF YOU HAVE NOT HEARD FROM US/SPECIALIST IN TWO WEEKS, PLEASE GIVE [place] A CALL  AT [phone].   THE PATIENT IS ENCOURAGED TO PRACTICE SOCIAL DISTANCING DUE TO THE COVID-19 PANDEMIC.

## 2021-04-02 ENCOUNTER — Telehealth: Payer: Self-pay

## 2021-04-02 ENCOUNTER — Other Ambulatory Visit: Payer: Self-pay

## 2021-04-02 DIAGNOSIS — L821 Other seborrheic keratosis: Secondary | ICD-10-CM | POA: Diagnosis not present

## 2021-04-02 DIAGNOSIS — H353131 Nonexudative age-related macular degeneration, bilateral, early dry stage: Secondary | ICD-10-CM | POA: Diagnosis not present

## 2021-04-02 DIAGNOSIS — H35371 Puckering of macula, right eye: Secondary | ICD-10-CM | POA: Diagnosis not present

## 2021-04-02 DIAGNOSIS — E119 Type 2 diabetes mellitus without complications: Secondary | ICD-10-CM | POA: Diagnosis not present

## 2021-04-02 DIAGNOSIS — H532 Diplopia: Secondary | ICD-10-CM | POA: Diagnosis not present

## 2021-04-02 NOTE — Chronic Care Management (AMB) (Signed)
Chronic Care Management Pharmacy Assistant   Name: Alec Solis  MRN: [id number] DOB: 30-Jul-1933   Reason for Encounter: Disease State/ Hypertension  Recent office visits:  01-09-2021 [name], MD. Glucose= 115, BUN= 32, Creatinine= 2.13, eGfr= 30, Chloride= 107  01-30-2021 Kellie Simmering, LPN. Medicare wellness. Prevnar given  Recent consult visits:  03-17-2021 [name], [name] (Podiatry).Toenails 1-5 b/l were debrided in length and girth with sterile nail nippers and dremel without iatrogenic bleeding. Return in 3 months  Hospital visits:  None in previous 6 months  Medications: Outpatient Encounter Medications as of 04/02/2021  Medication Sig   acetaminophen (TYLENOL) 650 MG CR tablet Take 650 mg by mouth every 8 (eight) hours as needed for pain.    allopurinol (ZYLOPRIM) 100 MG tablet TAKE 1 TABLET BY MOUTH TWICE DAILY   amLODipine (NORVASC) 5 MG tablet Take 1 tablet (5 mg total) by mouth daily.   aspirin 81 MG tablet Take 81 mg by mouth daily.  (Patient not taking: Reported on 01/30/2021)   B-D ULTRAFINE III SHORT PEN 31G X 8 MM MISC USE AS DIRECTED FOUR TIMES DAILY   BINAXNOW COVID-19 AG HOME TEST KIT AS DIRECTED PER PACKAGE INSTRUCTIONS   calcitRIOL (ROCALTROL) 0.25 MCG capsule Take 0.25 mcg by mouth every other day.    carboxymethylcellulose (REFRESH PLUS) 0.5 % SOLN Place 1 drop into both eyes 3 (three) times daily as needed (Watery eyes).    carvedilol (COREG) 12.5 MG tablet Take 12.5 mg by mouth 2 (two) times daily with a meal.   clopidogrel (PLAVIX) 75 MG tablet TAKE 1 TABLET(75 MG) BY MOUTH DAILY   ferrous sulfate 325 (65 FE) MG tablet Take 325 mg by mouth daily with breakfast.   furosemide (LASIX) 40 MG tablet Take 40 mg by mouth daily as needed for fluid or edema.    glucose blood (TRUE METRIX BLOOD GLUCOSE TEST) test strip Use as directed to check blood sugars 3 times per day dx: e11.65   insulin degludec (TRESIBA FLEXTOUCH) 200 UNIT/ML FlexTouch  Pen Inject 22 Units into the skin daily.   Insulin Pen Needle 32G X 8 MM MISC Use as directed   Multiple Vitamins-Minerals (MULTIVITAMIN ADULT PO) Take 1 tablet by mouth daily. [employer] Mega men enhancement   Multiple Vitamins-Minerals (PRESERVISION AREDS 2 PO) Take 1 tablet by mouth in the morning and at bedtime.   naproxen sodium (ALEVE) 220 MG tablet Take 220 mg by mouth daily as needed (Body ache).   Omega-3 Fatty Acids (FISH OIL OMEGA-3 PO) Take 1,200 mg by mouth daily.    omeprazole (PRILOSEC) 40 MG capsule TAKE 1 CAPSULE BY MOUTH EVERY DAY BEFORE A MEAL   ondansetron (ZOFRAN) 4 MG tablet TAKE 1 TABLET(4 MG) BY MOUTH DAILY AS NEEDED FOR NAUSEA OR VOMITING   rosuvastatin (CRESTOR) 20 MG tablet TAKE 1 TABLET BY MOUTH DAILY ON MONDAY TO FRIDAY   sodium bicarbonate 650 MG tablet Take 1,300 mg by mouth 2 (two) times daily.   TRUEplus Lancets 30G MISC USE AS DIRECTED TO TEST BLOOD SUGAR THREE TIMES DAILY   vitamin B-12 (CYANOCOBALAMIN) 1000 MCG tablet Take 1,000 mcg by mouth daily.   No facility-administered encounter medications on file as of 04/02/2021.   Reviewed chart prior to disease state call. Spoke with patient regarding BP  Recent Office Vitals: BP Readings from Last 3 Encounters:  01/09/21 124/78  10/01/20 124/78  07/01/20 118/64   Pulse Readings from Last 3 Encounters:  01/09/21 72  10/01/20 66  07/01/20 84    Wt Readings from Last 3 Encounters:  01/30/21 188 lb (85.3 kg)  01/09/21 184 lb 12.8 oz (83.8 kg)  10/01/20 188 lb 12.8 oz (85.6 kg)     Kidney Function Lab Results  Component Value Date/Time   CREATININE 2.13 (H) 01/09/2021 10:14 AM   CREATININE 2.16 (H) 10/01/2020 12:43 PM   GFRNONAA 27 (L) 10/01/2020 12:43 PM   GFRAA 31 (L) 10/01/2020 12:43 PM    BMP Latest Ref Rng & Units 01/09/2021 10/01/2020 07/01/2020  Glucose 65 - 99 mg/dL 115(H) 134(H) 152(H)  BUN 8 - 27 mg/dL 32(H) 29(H) 36(H)  Creatinine 0.76 - 1.27 mg/dL 2.13(H) 2.16(H) 2.22(H)  BUN/Creat Ratio 10 -  '24 15 13 16  ' Sodium 134 - 144 mmol/L 142 141 141  Potassium 3.5 - 5.2 mmol/L 4.8 4.5 5.2  Chloride 96 - 106 mmol/L 107(H) 103 106  CO2 20 - 29 mmol/L 21 22 18(L)  Calcium 8.6 - 10.2 mg/dL 10.0 9.6 9.6    Current antihypertensive regimen:  Amlodipine 5 mg daily Carvedilol 12.5 mg twice daily  How often are you checking your Blood Pressure? weekly  Current home BP readings: 120/72  What recent interventions/DTPs have been made by any provider to improve Blood Pressure control since last CPP Visit:  Patient states he is taking medications as directed  Any recent hospitalizations or ED visits since last visit with CPP? No  What diet changes have been made to improve Blood Pressure Control?  Patient stated he doesn't drink sodas and has increased his water intake. Patient stated he has decreased his salt intake  What exercise is being done to improve your Blood Pressure Control?  Patient states he is constantly moving around during the day.  Adherence Review: Is the patient currently on ACE/ARB medication? No Does the patient have >5 day gap between last estimated fill dates? No  NOTES: Patient has rescheduled missed appointment with Orlando Penner CPP to September  Care Gaps: Covid booster overdue Yearly ophthalmology exam overdue Medicare wellness 02-26-2022  Star Rating Drugs: Rosuvastatin 20 gm- Last filled 04-01-2021 66 [address] [place] Clinical Pharmacist Assistant [phone]

## 2021-04-06 NOTE — Progress Notes (Signed)
Pt did not show for appt. Erroneous

## 2021-05-06 ENCOUNTER — Encounter (HOSPITAL_COMMUNITY): Payer: Self-pay | Admitting: *Deleted

## 2021-05-06 ENCOUNTER — Other Ambulatory Visit: Payer: Self-pay | Admitting: Internal Medicine

## 2021-05-07 DIAGNOSIS — Z23 Encounter for immunization: Secondary | ICD-10-CM | POA: Diagnosis not present

## 2021-05-07 DIAGNOSIS — M109 Gout, unspecified: Secondary | ICD-10-CM | POA: Diagnosis not present

## 2021-05-07 DIAGNOSIS — E1122 Type 2 diabetes mellitus with diabetic chronic kidney disease: Secondary | ICD-10-CM | POA: Diagnosis not present

## 2021-05-07 DIAGNOSIS — N189 Chronic kidney disease, unspecified: Secondary | ICD-10-CM | POA: Diagnosis not present

## 2021-05-07 DIAGNOSIS — D631 Anemia in chronic kidney disease: Secondary | ICD-10-CM | POA: Diagnosis not present

## 2021-05-07 DIAGNOSIS — N2581 Secondary hyperparathyroidism of renal origin: Secondary | ICD-10-CM | POA: Diagnosis not present

## 2021-05-07 DIAGNOSIS — N183 Chronic kidney disease, stage 3 unspecified: Secondary | ICD-10-CM | POA: Diagnosis not present

## 2021-05-07 DIAGNOSIS — I129 Hypertensive chronic kidney disease with stage 1 through stage 4 chronic kidney disease, or unspecified chronic kidney disease: Secondary | ICD-10-CM | POA: Diagnosis not present

## 2021-05-07 LAB — HEMOGLOBIN A1C: Hemoglobin A1C: 9.6

## 2021-05-07 LAB — IRON,TIBC AND FERRITIN PANEL
Ferritin: 833
Iron: 51
TIBC: 315
UIBC: 264

## 2021-05-07 LAB — BASIC METABOLIC PANEL
BUN: 30 — AB (ref 4–21)
CO2: 26 — AB (ref 13–22)
Chloride: 105 (ref 99–108)
Creatinine: 2 — AB (ref 0.6–1.3)
Glucose: 158
Potassium: 4.8 (ref 3.4–5.3)
Sodium: 142 (ref 137–147)

## 2021-05-07 LAB — COMPREHENSIVE METABOLIC PANEL
Albumin: 4.3 (ref 3.5–5.0)
Calcium: 9.9 (ref 8.7–10.7)

## 2021-05-08 ENCOUNTER — Encounter (HOSPITAL_COMMUNITY): Payer: Self-pay | Admitting: *Deleted

## 2021-05-12 ENCOUNTER — Encounter: Payer: Self-pay | Admitting: Internal Medicine

## 2021-05-14 ENCOUNTER — Telehealth: Payer: Self-pay

## 2021-05-14 NOTE — Chronic Care Management (AMB) (Signed)
Chronic Care Management Pharmacy Assistant   Name: Alec Solis  MRN: [id number] DOB: 16-Jul-1933  Reason for Encounter: Disease State/ Hypertension  Recent office visits:  None  Recent consult visits:  None  Hospital visits:  None in previous 6 months  Medications: Outpatient Encounter Medications as of 05/14/2021  Medication Sig   acetaminophen (TYLENOL) 650 MG CR tablet Take 650 mg by mouth every 8 (eight) hours as needed for pain.    allopurinol (ZYLOPRIM) 100 MG tablet TAKE 1 TABLET BY MOUTH TWICE DAILY   amLODipine (NORVASC) 5 MG tablet Take 1 tablet (5 mg total) by mouth daily.   aspirin 81 MG tablet Take 81 mg by mouth daily.  (Patient not taking: Reported on 01/30/2021)   B-D ULTRAFINE III SHORT PEN 31G X 8 MM MISC USE AS DIRECTED FOUR TIMES DAILY   BINAXNOW COVID-19 AG HOME TEST KIT AS DIRECTED PER PACKAGE INSTRUCTIONS   calcitRIOL (ROCALTROL) 0.25 MCG capsule Take 0.25 mcg by mouth every other day.    carboxymethylcellulose (REFRESH PLUS) 0.5 % SOLN Place 1 drop into both eyes 3 (three) times daily as needed (Watery eyes).    carvedilol (COREG) 12.5 MG tablet Take 12.5 mg by mouth 2 (two) times daily with a meal.   clopidogrel (PLAVIX) 75 MG tablet TAKE 1 TABLET(75 MG) BY MOUTH DAILY   ferrous sulfate 325 (65 FE) MG tablet Take 325 mg by mouth daily with breakfast.   furosemide (LASIX) 40 MG tablet Take 40 mg by mouth daily as needed for fluid or edema.    glucose blood (TRUE METRIX BLOOD GLUCOSE TEST) test strip Use as directed to check blood sugars 3 times per day dx: e11.65   insulin degludec (TRESIBA FLEXTOUCH) 200 UNIT/ML FlexTouch Pen Inject 22 Units into the skin daily.   Insulin Pen Needle 32G X 8 MM MISC Use as directed   Multiple Vitamins-Minerals (MULTIVITAMIN ADULT PO) Take 1 tablet by mouth daily. [employer] Mega men enhancement   Multiple Vitamins-Minerals (PRESERVISION AREDS 2 PO) Take 1 tablet by mouth in the morning and at bedtime.   naproxen sodium (ALEVE)  220 MG tablet Take 220 mg by mouth daily as needed (Body ache).   Omega-3 Fatty Acids (FISH OIL OMEGA-3 PO) Take 1,200 mg by mouth daily.    omeprazole (PRILOSEC) 40 MG capsule TAKE 1 CAPSULE BY MOUTH EVERY DAY BEFORE A MEAL   ondansetron (ZOFRAN) 4 MG tablet TAKE 1 TABLET(4 MG) BY MOUTH DAILY AS NEEDED FOR NAUSEA OR VOMITING   rosuvastatin (CRESTOR) 20 MG tablet TAKE 1 TABLET BY MOUTH DAILY ON MONDAY TO FRIDAY   sodium bicarbonate 650 MG tablet Take 1,300 mg by mouth 2 (two) times daily.   TRUEplus Lancets 30G MISC USE AS DIRECTED TO TEST BLOOD SUGAR THREE TIMES DAILY   vitamin B-12 (CYANOCOBALAMIN) 1000 MCG tablet Take 1,000 mcg by mouth daily.   No facility-administered encounter medications on file as of 05/14/2021.   Reviewed chart prior to disease state call. Spoke with patient regarding BP  Recent Office Vitals: BP Readings from Last 3 Encounters:  01/09/21 124/78  10/01/20 124/78  07/01/20 118/64   Pulse Readings from Last 3 Encounters:  01/09/21 72  10/01/20 66  07/01/20 84    Wt Readings from Last 3 Encounters:  01/30/21 188 lb (85.3 kg)  01/09/21 184 lb 12.8 oz (83.8 kg)  10/01/20 188 lb 12.8 oz (85.6 kg)     Kidney Function Lab Results  Component Value Date/Time   CREATININE 2.0 (  A) 05/07/2021 12:00 AM   CREATININE 2.13 (H) 01/09/2021 10:14 AM   CREATININE 2.16 (H) 10/01/2020 12:43 PM   GFRNONAA 27 (L) 10/01/2020 12:43 PM   GFRAA 31 (L) 10/01/2020 12:43 PM    BMP Latest Ref Rng & Units 05/07/2021 01/09/2021 10/01/2020  Glucose 65 - 99 mg/dL - 115(H) 134(H)  BUN 4 - 21 30(A) 32(H) 29(H)  Creatinine 0.6 - 1.3 2.0(A) 2.13(H) 2.16(H)  BUN/Creat Ratio 10 - 24 - 15 13  Sodium 137 - 147 142 142 141  Potassium 3.4 - 5.3 4.8 4.8 4.5  Chloride 99 - 108 105 107(H) 103  CO2 13 - 22 26(A) 21 22  Calcium 8.7 - 10.7 9.9 10.0 9.6    Current antihypertensive regimen:  Amlodipine 5 mg daily Carvedilol 12.5 mg twice daily  How often are you checking your Blood Pressure?  1-2x per week  Current home BP readings: 127/82  What recent interventions/DTPs have been made by any provider to improve Blood Pressure control since last CPP Visit:  Patient states he is taking medications as directed  Any recent hospitalizations or ED visits since last visit with CPP? No  What diet changes have been made to improve Blood Pressure Control?  Patient stated he doesn't drink sodas and has increased his water intake. Patient stated he has decreased his salt intake  What exercise is being done to improve your Blood Pressure Control?  Patient states he is constantly moving around during the day.  Adherence Review: Is the patient currently on ACE/ARB medication? No Does the patient have >5 day gap between last estimated fill dates? No  NOTES: Rescheduled 05-15-2021 appointment with Orlando Penner CPP to October.  Care Gaps: Covid booster overdue Yearly ophthalmology exam overdue Medicare wellness 02-26-2022  Star Rating Drugs: Rosuvastatin 20 gm- Last filled 04-01-2021 90 [address] [place] Clinical Pharmacist Assistant [phone]

## 2021-05-15 ENCOUNTER — Ambulatory Visit: Payer: Medicare Other | Admitting: Internal Medicine

## 2021-05-15 ENCOUNTER — Telehealth: Payer: Self-pay

## 2021-05-22 ENCOUNTER — Other Ambulatory Visit: Payer: Self-pay

## 2021-05-22 MED ORDER — OMEPRAZOLE 40 MG PO CPDR: DELAYED_RELEASE_CAPSULE | ORAL | 1 refills | Status: DC

## 2021-05-23 ENCOUNTER — Encounter (HOSPITAL_COMMUNITY): Payer: Self-pay | Admitting: *Deleted

## 2021-05-29 DIAGNOSIS — Z23 Encounter for immunization: Secondary | ICD-10-CM | POA: Diagnosis not present

## 2021-05-30 ENCOUNTER — Encounter (HOSPITAL_COMMUNITY): Payer: Self-pay | Admitting: *Deleted

## 2021-06-07 ENCOUNTER — Other Ambulatory Visit: Payer: Self-pay | Admitting: Internal Medicine

## 2021-06-12 ENCOUNTER — Telehealth: Payer: Self-pay

## 2021-06-17 ENCOUNTER — Ambulatory Visit (INDEPENDENT_AMBULATORY_CARE_PROVIDER_SITE_OTHER): Payer: Medicare Other

## 2021-06-17 DIAGNOSIS — E1122 Type 2 diabetes mellitus with diabetic chronic kidney disease: Secondary | ICD-10-CM

## 2021-06-17 DIAGNOSIS — E782 Mixed hyperlipidemia: Secondary | ICD-10-CM

## 2021-06-17 NOTE — Progress Notes (Signed)
Chronic Care Management Pharmacy Note  06/18/2021 Name:  Alec Solis MRN:  [id number] DOB:  1933-08-09  Summary: Patient reports that his birthday is sometimes more somber because that is the date his wife went to the hospital   Recommendations/Changes made from today's visit: Recommend patient reduce the amount of juice he is drinking daily and add more water.  Plan: Patient to drink half water and half juice together. He is also going to start drinking more plain water.    Subjective: Alec Solis is an 87 y.o. year old male who is a primary patient of [name], MD.  The CCM team was consulted for assistance with disease management and care coordination needs.    Engaged with patient by telephone for follow up visit in response to provider referral for pharmacy case management and/or care coordination services. Patient reports that he has gone to the [place] to get some follow up on [facility] with no success.   Consent to Services:  The patient was given information about Chronic Care Management services, agreed to services, and gave verbal consent prior to initiation of services.  Please see initial visit note for detailed documentation.   Patient Care Team: [name], MD as PCP - General (Internal Medicine) Mayford Knife, [facility] (Pharmacist)  Recent office visits: 03/17/2021 PCP OV  01/09/2021 PCP OV  Recent consult visits: 12/16/2020 [facility] visits: None in previous 6 months   Objective:  Lab Results  Component Value Date   CREATININE 2.0 (A) 05/07/2021   BUN 30 (A) 05/07/2021   GFRNONAA 27 (L) 10/01/2020   GFRAA 31 (L) 10/01/2020   NA 142 05/07/2021   K 4.8 05/07/2021   CALCIUM 9.9 05/07/2021   CO2 26 (A) 05/07/2021   GLUCOSE 115 (H) 01/09/2021    Lab Results  Component Value Date/Time   HGBA1C 9.6 05/07/2021 12:00 AM   HGBA1C 7.6 (H) 01/09/2021 10:14 AM   HGBA1C 6.4 (H) 10/01/2020 12:43 PM   MICROALBUR 80 02/20/2020 11:46 AM    MICROALBUR 30 01/17/2019 12:39 PM    Last diabetic Eye exam:  Lab Results  Component Value Date/Time   HMDIABEYEEXA No Retinopathy 03/22/2020 12:00 AM    Last diabetic Foot exam: No results found for: HMDIABFOOTEX   Lab Results  Component Value Date   CHOL 115 10/24/2020   HDL 26 (L) 10/24/2020   LDLCALC 48 10/24/2020   TRIG 264 (H) 10/24/2020   CHOLHDL 4.4 10/24/2020    Hepatic Function Latest Ref Rng & Units 05/07/2021 10/01/2020 05/07/2020  Total Protein 6.0 - 8.5 g/dL - 7.6 -  Albumin 3.5 - 5.0 4.3 4.3 3.6  AST 0 - 40 IU/L - 13 -  ALT 0 - 44 IU/L - 12 -  Alk Phosphatase 44 - 121 IU/L - 130(H) -  Total Bilirubin 0.0 - 1.2 mg/dL - <0.2 -  Bilirubin, Direct 0.00 - 0.40 mg/dL - - -    No results found for: TSH, FREET4  CBC Latest Ref Rng & Units 01/09/2021 05/21/2020 05/07/2020  WBC 3.4 - 10.8 x10E3/uL 5.7 - -  Hemoglobin 13.0 - 17.7 g/dL 9.2(L) 11.0(L) 9.9(L)  Hematocrit 37.5 - 51.0 % 31.5(L) - -  Platelets 150 - 450 x10E3/uL 263 - -    No results found for: VD25OH  Clinical ASCVD: No  The ASCVD Risk score (Arnett DK, et al., 2019) failed to calculate for the following reasons:   The 2019 ASCVD risk score is only valid for ages 76  to 40    Depression screen [facility] 2/9 06/17/2021 01/30/2021 09/04/2020  Decreased Interest 2 0 2  Down, Depressed, Hopeless 0 0 2  PHQ - 2 Score 2 0 4  Altered sleeping 1 - 1  Tired, decreased energy 1 - 1  Change in appetite 0 - 2  Feeling bad or failure about yourself  0 - 0  Trouble concentrating 1 - 1  Moving slowly or fidgety/restless 0 - 1  Suicidal thoughts 0 - 0  PHQ-9 Score 5 - 10  Difficult doing work/chores - - Somewhat difficult  Some recent data might be hidden     Social History   Tobacco Use  Smoking Status Former   Packs/day: 0.50   Years: 50.00   Pack years: 25.00   Types: Cigarettes   Start date: 08/24/1952   Quit date: 08/24/2014   Years since quitting: 6.8  Smokeless Tobacco Never   BP Readings from Last 3  Encounters:  01/09/21 124/78  10/01/20 124/78  07/01/20 118/64   Pulse Readings from Last 3 Encounters:  01/09/21 72  10/01/20 66  07/01/20 84   Wt Readings from Last 3 Encounters:  01/30/21 188 lb (85.3 kg)  01/09/21 184 lb 12.8 oz (83.8 kg)  10/01/20 188 lb 12.8 oz (85.6 kg)   BMI Readings from Last 3 Encounters:  01/30/21 25.50 kg/m  01/09/21 25.06 kg/m  10/01/20 25.61 kg/m    Assessment/Interventions: Review of patient past medical history, allergies, medications, health status, including review of consultants reports, laboratory and other test data, was performed as part of comprehensive evaluation and provision of chronic care management services.   SDOH:  (Social Determinants of Health) assessments and interventions performed: No SDOH Interventions    Flowsheet Row Most Recent Value  SDOH Interventions   Depression Interventions/Treatment  Patient refuses Treatment      SDOH Screenings   Alcohol Screen: Not on file  Depression (PHQ2-9): Medium Risk   PHQ-2 Score: 5  Financial Resource Strain: Low Risk    Difficulty of Paying Living Expenses: Not hard at all  Food Insecurity: No Food Insecurity   Worried About Charity fundraiser in the Last Year: Never true   Ran Out of Food in the Last Year: Never true  Housing: Not on file  Physical Activity: Sufficiently Active   Days of Exercise per Week: 6 days   Minutes of Exercise per Session: 30 min  Social Connections: Not on file  Stress: No Stress Concern Present   Feeling of Stress : Not at all  Tobacco Use: Medium Risk   Smoking Tobacco Use: Former   Smokeless Tobacco Use: Never   Passive Exposure: Not on file  Transportation Needs: No Transportation Needs   Lack of Transportation (Medical): No   Lack of Transportation (Non-Medical): No    CCM Care Plan  No Known Allergies  Medications Reviewed Today     Reviewed by Mayford Knife, RPH (Pharmacist) on 06/17/21 at 1117  Med List Status: <None>    Medication Order Taking? Sig Documenting Provider Last Dose Status Informant  acetaminophen (TYLENOL) 650 MG CR tablet [id number] No Take 650 mg by mouth every 8 (eight) hours as needed for pain.  [provider] Taking Active Self  allopurinol (ZYLOPRIM) 100 MG tablet [id number]  TAKE 1 TABLET BY MOUTH TWICE DAILY [name], MD  Active   amLODipine (NORVASC) 5 MG tablet [id number] No Take 1 tablet (5 mg total) by mouth daily. [name], MD Taking Active  aspirin 81 MG tablet [id number] No Take 81 mg by mouth daily.   Patient not taking: Reported on 01/30/2021   [provider] Not Taking Active Self  B-D ULTRAFINE III SHORT PEN 31G X 8 MM MISC [id number] No USE AS DIRECTED FOUR TIMES DAILY [name], MD Taking Active Self  BINAXNOW COVID-19 [place] TEST KIT [id number]  AS DIRECTED PER PACKAGE INSTRUCTIONS [provider]  Active   calcitRIOL (ROCALTROL) 0.25 MCG capsule [id number] No Take 0.25 mcg by mouth every other day.  [provider] Taking Active Self  carboxymethylcellulose (REFRESH PLUS) 0.5 % SOLN [id number] No Place 1 drop into both eyes 3 (three) times daily as needed (Watery eyes).  [provider] Taking Active Self  carvedilol (COREG) 12.5 MG tablet [id number] No Take 12.5 mg by mouth 2 (two) times daily with a meal. [provider] Taking Active Self  clopidogrel (PLAVIX) 75 MG tablet [id number] No TAKE 1 TABLET(75 MG) BY MOUTH DAILY [name], MD Taking Active   ferrous sulfate 325 (65 FE) MG tablet [id number] No Take 325 mg by mouth daily with breakfast. [provider] Taking Active Self  furosemide (LASIX) 40 MG tablet [id number] No Take 40 mg by mouth daily as needed for fluid or edema.  [provider] Taking Active Self           Med Note Gentry Roch   Wed Mar 27, 2020 10:27 AM)    glucose blood (TRUE METRIX BLOOD GLUCOSE TEST) test strip [id number] No Use as directed to check blood sugars  3 times per day dx: e11.65 [name], MD Taking Active   insulin degludec (TRESIBA FLEXTOUCH) 200 UNIT/ML FlexTouch Pen [id number] No Inject 22 Units into the skin daily. [name], MD Taking Active   Insulin Pen Needle 32G X 8 MM MISC [id number] No Use as directed [name], MD Taking Active Self  Multiple Vitamins-Minerals (MULTIVITAMIN ADULT PO) [id number] No Take 1 tablet by mouth daily. [place] Mega men enhancement [provider] Taking Active Self           Med Note Gentry Roch   Wed Mar 27, 2020 10:37 AM)    Multiple Vitamins-Minerals (PRESERVISION AREDS 2 PO) [id number] No Take 1 tablet by mouth in the morning and at bedtime. [provider] Taking Active   naproxen sodium (ALEVE) 220 MG tablet [id number] No Take 220 mg by mouth daily as needed (Body ache). [provider] Taking Active   Omega-3 Fatty Acids (FISH OIL OMEGA-3 PO) [id number] No Take 1,200 mg by mouth daily.  [provider] Taking Active Self           Med Note Nadara Eaton Mar 27, 2020 10:31 AM)    omeprazole (PRILOSEC) 40 MG capsule [id number]  TAKE 1 CAPSULE BY MOUTH EVERY DAY BEFORE A MEAL [name], MD  Active   ondansetron (ZOFRAN) 4 MG tablet [id number]  TAKE 1 TABLET(4 MG) BY MOUTH DAILY AS NEEDED FOR NAUSEA OR VOMITING [name], MD  Active   rosuvastatin (CRESTOR) 20 MG tablet [id number] No TAKE 1 TABLET BY MOUTH DAILY ON MONDAY TO Billy Fischer, MD Taking Active   sodium bicarbonate 650 MG tablet [id number] No Take 1,300 mg by mouth 2 (two) times daily. [provider] Taking Active Self  TRUEplus Lancets 30G MISC [id number] No USE AS DIRECTED TO TEST BLOOD SUGAR THREE TIMES DAILY [name], MD Taking Active   vitamin B-12 (CYANOCOBALAMIN) 1000 MCG tablet [id number]  No Take 1,000 mcg by mouth daily. [provider] Taking Active Self            Patient Active Problem List   Diagnosis Date Noted   Abnormal feces  03/20/2021   Abnormal weight loss 03/20/2021   Diarrhea 03/20/2021   Diverticular disease of colon 03/20/2021   Dysphagia 03/20/2021   Epigastric pain 03/20/2021   Hemorrhage of rectum and anus 03/20/2021   Iron deficiency anemia 03/20/2021   Nausea and vomiting 03/20/2021   Black stools 03/06/2020   Gastroesophageal reflux disease without esophagitis 03/06/2020   Paget's disease of bone 10/23/2018   Acute renal failure (ARF) ([place]) 06/11/2016   Hyperglycemia 06/11/2016   Deficiency anemia 12/09/2011   Diabetes mellitus with stage 3 chronic kidney disease ([place]) 12/09/2011   Renal insufficiency 12/09/2011    Immunization History  Administered Date(s) Administered   Influenza, High Dose Seasonal PF 05/25/2018, 04/19/2019   Influenza-Unspecified 05/24/2013, 05/31/2018, 05/07/2021   PFIZER(Purple Top)SARS-COV-2 Vaccination 10/23/2019, 11/15/2019, 09/12/2020   Pneumococcal Polysaccharide-23 05/24/2013, 05/24/2017    Conditions to be addressed/monitored:  Hyperlipidemia and Diabetes  Care Plan : [place]  Updates made by Mayford Knife, [place] since 06/18/2021 12:00 AM     Problem: HLD, DM II   Priority: High     Long-Range Goal: Disease Management   Recent Progress: On track  Priority: High  Note:     Current Barriers:  Unable to independently monitor therapeutic efficacy  Pharmacist Clinical Goal(s):  Patient will achieve adherence to monitoring guidelines and medication adherence to achieve therapeutic efficacy achieve control of glucose as evidenced by A1C <8 through collaboration with PharmD and provider.   Interventions: 1:1 collaboration with [name], MD regarding development and update of comprehensive plan of care as evidenced by provider attestation and co-signature Inter-disciplinary care team collaboration (see longitudinal plan of care) Comprehensive medication review performed; medication list updated in electronic medical  record    Hyperlipidemia: (LDL goal < 70) -Controlled -Current treatment: Rosuvastatin 20 mg tablet once per day  -Medications previously tried: none noted at this time   -Current dietary patterns: he is avoiding fried and fatty foods, he is stove top grilling -Current exercise habits: patient reports staying very busy -Educated on Benefits of statin for ASCVD risk reduction; Importance of limiting foods high in cholesterol; -Recommended to continue current medication  Diabetes (A1c goal <8%) -Uncontrolled -Current medications: Tresiba 200 unit/ml - inject 22 units into the skin daily  -Medications previously tried: Farxiga-nausea, Ozempic-nausea, Janumet  -Current home glucose readings fasting glucose: 130,134,100,78,148, 77 post prandial glucose: 109,132,115, 84, 103, 169,188, 96  -Denies hypoglycemic/hyperglycemic symptoms -Current meal patterns:  breakfast:egg, with a piece of sausage sometimes oatmeal  lunch: not often eaten   dinner: chicken and vegetables, pork chop and vegetables, piece of beef with vegetables, string beans and sweet peas, sometimes salad  snacks: he reports that he is not eating any snacks, but he has a bag of donuts  drinks: patient reports drinking juice-apple juice, to replace soda.  He reports drinking water periodically  -Current exercise: he reports not doing any exercise  -Educated on A1c and blood sugar goals; Complications of diabetes including kidney damage, retinal damage, and cardiovascular disease; -Mr. Brayman is open to trying another medication if it is in pill form and does not want to retry past medications.  -Counseled to check feet daily and get yearly eye exams -Recommended to continue current medication  Patient Goals/Self-Care Activities Patient will:  - take  medications as prescribed  Follow Up Plan: The patient has been provided with contact information for the care management team and has been advised to call with any health  related questions or concerns.        Medication Assistance: None required.  Patient affirms current coverage meets needs.  Compliance/Adherence/Medication fill history: Care Gaps: COVID-19 Vaccine  - 05/29/2021 Pfizer at [facility] [employer]  Ophthalmology Exam  Star-Rating Drugs: Rosuvastatin 20 mg tablet  Patient's preferred pharmacy is:  [id number] [address], [place] [address] Phone: [phone] Fax: [phone]  Uses pill box? Yes Pt endorses 95% compliance  We discussed: Benefits of medication synchronization, packaging and delivery as well as enhanced pharmacist oversight with [employer]. Patient decided to: Continue current medication management strategy  Care Plan and Follow Up Patient Decision:  Patient agrees to Care Plan and Follow-up.  Plan: The patient has been provided with contact information for the care management team and has been advised to call with any health related questions or concerns.   Orlando Penner, PharmD Clinical Pharmacist [facility] [phone]

## 2021-06-17 NOTE — Chronic Care Management (AMB) (Signed)
    Chronic Care Management Pharmacy Assistant   Name: Alec Solis  MRN: [id number] DOB: Dec 17, 1932  Reason for Encounter: Patient Assistance Coordination  06/12/2021- Patient stopped by PCP office regarding Alec Solis medication with [employer] patient assistance program. Checked refrigerator to see if medication arrived, not there, checked patient's records to see when he was approved with [employer], application was faxed successfully on 10/07/2020, no status. Patient given a sample of Tresiba and aware I will call him with follow up from [employer].  06/17/2021- Called [employer] to check status of application. On hold for 12 minutes, left message for representative to call back, will also refax application. Will follow up.   Medications: Outpatient Encounter Medications as of 06/12/2021  Medication Sig   acetaminophen (TYLENOL) 650 MG CR tablet Take 650 mg by mouth every 8 (eight) hours as needed for pain.    allopurinol (ZYLOPRIM) 100 MG tablet TAKE 1 TABLET BY MOUTH TWICE DAILY   amLODipine (NORVASC) 5 MG tablet Take 1 tablet (5 mg total) by mouth daily.   aspirin 81 MG tablet Take 81 mg by mouth daily.  (Patient not taking: Reported on 01/30/2021)   B-D ULTRAFINE III SHORT PEN 31G X 8 MM MISC USE AS DIRECTED FOUR TIMES DAILY   BINAXNOW COVID-19 [place] TEST KIT AS DIRECTED PER PACKAGE INSTRUCTIONS   calcitRIOL (ROCALTROL) 0.25 MCG capsule Take 0.25 mcg by mouth every other day.    carboxymethylcellulose (REFRESH PLUS) 0.5 % SOLN Place 1 drop into both eyes 3 (three) times daily as needed (Watery eyes).    carvedilol (COREG) 12.5 MG tablet Take 12.5 mg by mouth 2 (two) times daily with a meal.   clopidogrel (PLAVIX) 75 MG tablet TAKE 1 TABLET(75 MG) BY MOUTH DAILY   ferrous sulfate 325 (65 FE) MG tablet Take 325 mg by mouth daily with breakfast.   furosemide (LASIX) 40 MG tablet Take 40 mg by mouth daily as needed for fluid or edema.    glucose blood (TRUE METRIX BLOOD GLUCOSE  TEST) test strip Use as directed to check blood sugars 3 times per day dx: e11.65   insulin degludec (TRESIBA FLEXTOUCH) 200 UNIT/ML FlexTouch Pen Inject 22 Units into the skin daily.   Insulin Pen Needle 32G X 8 MM MISC Use as directed   Multiple Vitamins-Minerals (MULTIVITAMIN ADULT PO) Take 1 tablet by mouth daily. [employer] Mega men enhancement   Multiple Vitamins-Minerals (PRESERVISION AREDS 2 PO) Take 1 tablet by mouth in the morning and at bedtime.   naproxen sodium (ALEVE) 220 MG tablet Take 220 mg by mouth daily as needed (Body ache).   Omega-3 Fatty Acids (FISH OIL OMEGA-3 PO) Take 1,200 mg by mouth daily.    omeprazole (PRILOSEC) 40 MG capsule TAKE 1 CAPSULE BY MOUTH EVERY DAY BEFORE A MEAL   ondansetron (ZOFRAN) 4 MG tablet TAKE 1 TABLET(4 MG) BY MOUTH DAILY AS NEEDED FOR NAUSEA OR VOMITING   rosuvastatin (CRESTOR) 20 MG tablet TAKE 1 TABLET BY MOUTH DAILY ON MONDAY TO FRIDAY   sodium bicarbonate 650 MG tablet Take 1,300 mg by mouth 2 (two) times daily.   TRUEplus Lancets 30G MISC USE AS DIRECTED TO TEST BLOOD SUGAR THREE TIMES DAILY   vitamin B-12 (CYANOCOBALAMIN) 1000 MCG tablet Take 1,000 mcg by mouth daily.   No facility-administered encounter medications on file as of 06/12/2021.    Pattricia Boss, [place] Pharmacist Assistant [phone]

## 2021-06-18 NOTE — Patient Instructions (Signed)
Visit Information It was great speaking with you today!  Please let me know if you have any questions about our visit.   Goals Addressed             This Visit's Progress    Manage My Medicine       Timeframe:  Long-Range Goal Priority:  High Start Date:                             Expected End Date:                       Follow Up Date : 07/24/2021   - call for medicine refill 2 or 3 days before it runs out - call if I am sick and can't take my medicine - keep a list of all the medicines I take; vitamins and herbals too - use an alarm clock or phone to remind me to take my medicine    Why is this important?   These steps will help you keep on track with your medicines.   Notes:  Patient to bring in financial information to next appointment with Dr. Baird Cancer on 01/09/2021 for his [place] application.         Patient Care Plan: CCM Pharmacy Care Plan     Problem Identified: HLD, DM II   Priority: High     Long-Range Goal: Disease Management   Recent Progress: On track  Priority: High  Note:     Current Barriers:  Unable to independently monitor therapeutic efficacy  Pharmacist Clinical Goal(s):  Patient will achieve adherence to monitoring guidelines and medication adherence to achieve therapeutic efficacy achieve control of glucose as evidenced by A1C <8 through collaboration with PharmD and provider.   Interventions: 1:1 collaboration with [name], MD regarding development and update of comprehensive plan of care as evidenced by provider attestation and co-signature Inter-disciplinary care team collaboration (see longitudinal plan of care) Comprehensive medication review performed; medication list updated in electronic medical record    Hyperlipidemia: (LDL goal < 70) -Controlled -Current treatment: Rosuvastatin 20 mg tablet once per day  -Medications previously tried: none noted at this time   -Current dietary patterns: he is avoiding fried and  fatty foods, he is stove top grilling -Current exercise habits: patient reports staying very busy -Educated on Benefits of statin for ASCVD risk reduction; Importance of limiting foods high in cholesterol; -Recommended to continue current medication  Diabetes (A1c goal <8%) -Uncontrolled -Current medications: Tresiba 200 unit/ml - inject 22 units into the skin daily  -Medications previously tried: Farxiga-nausea, Ozempic-nausea, Janumet  -Current home glucose readings fasting glucose: 130,134,100,78,148, 77 post prandial glucose: 109,132,115, 84, 103, 169,188, 96  -Denies hypoglycemic/hyperglycemic symptoms -Current meal patterns:  breakfast:egg, with a piece of sausage sometimes oatmeal  lunch: not often eaten   dinner: chicken and vegetables, pork chop and vegetables, piece of beef with vegetables, string beans and sweet peas, sometimes salad  snacks: he reports that he is not eating any snacks, but he has a bag of donuts  drinks: patient reports drinking juice-apple juice, to replace soda.  He reports drinking water periodically  -Current exercise: he reports not doing any exercise  -Educated on A1c and blood sugar goals; Complications of diabetes including kidney damage, retinal damage, and cardiovascular disease; -[name]  -  Counseled to check feet daily and get yearly eye exams -Recommended to continue current medication  Patient Goals/Self-Care Activities Patient will:  - take medications as prescribed  Follow Up Plan: The patient has been provided with contact information for the care management team and has been advised to call with any health related questions or concerns.        Patient agreed to services and verbal consent obtained.   The patient verbalized understanding of instructions, educational materials, and care plan provided today and agreed to receive  a mailed copy of patient instructions, educational materials, and care plan.   Alec Solis, PharmD Clinical Pharmacist [facility] [phone]

## 2021-06-23 ENCOUNTER — Other Ambulatory Visit: Payer: Self-pay

## 2021-06-23 ENCOUNTER — Encounter: Payer: Self-pay | Admitting: Podiatry

## 2021-06-23 ENCOUNTER — Ambulatory Visit (INDEPENDENT_AMBULATORY_CARE_PROVIDER_SITE_OTHER): Payer: Medicare Other | Admitting: Podiatry

## 2021-06-23 DIAGNOSIS — E1122 Type 2 diabetes mellitus with diabetic chronic kidney disease: Secondary | ICD-10-CM | POA: Diagnosis not present

## 2021-06-23 DIAGNOSIS — M79674 Pain in right toe(s): Secondary | ICD-10-CM | POA: Diagnosis not present

## 2021-06-23 DIAGNOSIS — E782 Mixed hyperlipidemia: Secondary | ICD-10-CM | POA: Diagnosis not present

## 2021-06-23 DIAGNOSIS — M79675 Pain in left toe(s): Secondary | ICD-10-CM | POA: Diagnosis not present

## 2021-06-23 DIAGNOSIS — N1832 Chronic kidney disease, stage 3b: Secondary | ICD-10-CM

## 2021-06-23 DIAGNOSIS — B351 Tinea unguium: Secondary | ICD-10-CM | POA: Diagnosis not present

## 2021-06-23 DIAGNOSIS — L84 Corns and callosities: Secondary | ICD-10-CM

## 2021-06-23 DIAGNOSIS — Z794 Long term (current) use of insulin: Secondary | ICD-10-CM | POA: Diagnosis not present

## 2021-06-23 DIAGNOSIS — Q828 Other specified congenital malformations of skin: Secondary | ICD-10-CM

## 2021-06-23 DIAGNOSIS — N183 Chronic kidney disease, stage 3 unspecified: Secondary | ICD-10-CM

## 2021-06-26 NOTE — Progress Notes (Signed)
Subjective: Alec Solis is a 87 y.o. male patient seen today for follow up of calluses b/l, porokeratotic lesions b/l and painful thick toenails that are difficult to trim. Pain interferes with ambulation. Aggravating factors include wearing enclosed shoe gear. Pain is relieved with periodic professional debridement.  He is seen for at risk foot care with h/o NIDDM and CKD.  New problems reported today: None.  Patient states their blood glucose was 72 mg/dl today.   PCP is [name], MD. Last visit was: 01/09/2021.  No Known Allergies  Objective: Physical Exam  General: Patient is a pleasant 87 y.o. African American male WD, WN in NAD. AAO x 3.   Neurovascular Examination: CFT immediate b/l LE. Faintly palpable DP/PT pulses b/l LE. Digital hair absent b/l. Skin temperature gradient WNL b/l. No pain with calf compression b/l. No edema noted b/l. Lower extremity skin temperature gradient within normal limits.  Protective sensation intact 5/5 intact bilaterally with 10g monofilament b/l. Vibratory sensation intact b/l.  Dermatological:  Pedal skin warm and supple b/l.  No open wounds b/l. No interdigital macerations. Toenails 1-5 b/l elongated, thickened, discolored with subungual debris. +Tenderness with dorsal palpation of nailplates. Hyperkeratotic lesion(s) noted L hallux, L 5th toe, and R hallux.  Porokeratotic lesion(s) noted submet head 3 left foot. Toenails 1-5 b/l elongated, discolored, dystrophic, thickened, crumbly with subungual debris and tenderness to dorsal palpation.  Musculoskeletal:  Normal muscle strength 5/5 to all lower extremity muscle groups bilaterally. HAV with bunion deformity noted b/l LE. Hammertoe deformity noted 2-5 b/l.  Assessment: 1. Pain due to onychomycosis of toenails of both feet   2. Corns and callosities   3. Porokeratosis   4. Diabetes mellitus with stage 3 chronic kidney disease ([place])    Plan: Patient was evaluated and treated and all  questions answered. Consent given for treatment as described below: -Examined patient. -Continue diabetic foot care principles: inspect feet daily, monitor glucose as recommended by PCP and/or Endocrinologist, and follow prescribed diet per PCP, Endocrinologist and/or dietician. -Toenails 1-5 b/l were debrided in length and girth with sterile nail nippers and dremel without iatrogenic bleeding.  -Callus(es) L hallux, L 5th toe, and R hallux pared utilizing sterile scalpel blade without complication or incident. Total number debrided =3. -Painful porokeratotic lesion(s) submet head 3 left foot pared and enucleated with sterile scalpel blade without incident. Total number of lesions debrided=1. -Patient/POA to call should there be question/concern in the interim.  Return in about 3 months (around 09/23/2021).  [name], [name]

## 2021-07-10 ENCOUNTER — Other Ambulatory Visit: Payer: Self-pay

## 2021-07-10 MED ORDER — AMLODIPINE BESYLATE 5 MG PO TABS: 5.0000 mg | ORAL_TABLET | Freq: Every day | ORAL | 1 refills | Status: DC

## 2021-07-14 ENCOUNTER — Telehealth: Payer: Self-pay

## 2021-07-14 NOTE — Chronic Care Management (AMB) (Addendum)
    Chronic Care Management Pharmacy Assistant   Name: Alec Solis  MRN: [id number] DOB: 03-11-1933  Reason for Encounter: Patient assistance   Medications: Outpatient Encounter Medications as of 07/14/2021  Medication Sig   acetaminophen (TYLENOL) 650 MG CR tablet Take 650 mg by mouth every 8 (eight) hours as needed for pain.    allopurinol (ZYLOPRIM) 100 MG tablet TAKE 1 TABLET BY MOUTH TWICE DAILY   amLODipine (NORVASC) 5 MG tablet Take 1 tablet (5 mg total) by mouth daily.   aspirin 81 MG tablet Take 81 mg by mouth daily.  (Patient not taking: Reported on 01/30/2021)   B-D ULTRAFINE III SHORT PEN 31G X 8 MM MISC USE AS DIRECTED FOUR TIMES DAILY   BINAXNOW COVID-19 [place] HOME TEST KIT AS DIRECTED PER PACKAGE INSTRUCTIONS   calcitRIOL (ROCALTROL) 0.25 MCG capsule Take 0.25 mcg by mouth every other day.    carboxymethylcellulose (REFRESH PLUS) 0.5 % SOLN Place 1 drop into both eyes 3 (three) times daily as needed (Watery eyes).    carvedilol (COREG) 12.5 MG tablet Take 12.5 mg by mouth 2 (two) times daily with a meal.   clopidogrel (PLAVIX) 75 MG tablet TAKE 1 TABLET(75 MG) BY MOUTH DAILY   ferrous sulfate 325 (65 FE) MG tablet Take 325 mg by mouth daily with breakfast.   furosemide (LASIX) 40 MG tablet Take 40 mg by mouth daily as needed for fluid or edema.    glucose blood (TRUE METRIX BLOOD GLUCOSE TEST) test strip Use as directed to check blood sugars 3 times per day dx: e11.65   insulin degludec (TRESIBA FLEXTOUCH) 200 UNIT/ML FlexTouch Pen Inject 22 Units into the skin daily.   Insulin Pen Needle 32G X 8 MM MISC Use as directed   Multiple Vitamins-Minerals (MULTIVITAMIN ADULT PO) Take 1 tablet by mouth daily. [employer] Mega men enhancement   Multiple Vitamins-Minerals (PRESERVISION AREDS 2 PO) Take 1 tablet by mouth in the morning and at bedtime.   naproxen sodium (ALEVE) 220 MG tablet Take 220 mg by mouth daily as needed (Body ache).   Omega-3 Fatty Acids (FISH OIL OMEGA-3 PO) Take 1,200 mg  by mouth daily.    omeprazole (PRILOSEC) 40 MG capsule TAKE 1 CAPSULE BY MOUTH EVERY DAY BEFORE A MEAL   ondansetron (ZOFRAN) 4 MG tablet TAKE 1 TABLET(4 MG) BY MOUTH DAILY AS NEEDED FOR NAUSEA OR VOMITING   rosuvastatin (CRESTOR) 20 MG tablet TAKE 1 TABLET BY MOUTH DAILY ON MONDAY TO FRIDAY   sodium bicarbonate 650 MG tablet Take 1,300 mg by mouth 2 (two) times daily.   TRUEplus Lancets 30G MISC USE AS DIRECTED TO TEST BLOOD SUGAR THREE TIMES DAILY   vitamin B-12 (CYANOCOBALAMIN) 1000 MCG tablet Take 1,000 mcg by mouth daily.   No facility-administered encounter medications on file as of 07/14/2021.   07-14-2021: [employer] patient assistance to follow up on tresiba application. The automated system couldn't find the patient and then was on hold for a representative for 10 minutes. Left call back information to call in 30 minutes. [employer] representative called and needs to know if insurance is private or employer in order to process application Called and left patient voicemail to call back.  07-21-2021: Patient clarified insurance type and [employer] patient assistance was notified, application updated and approved. Was told it will take a day to process and 10-14 days to ship. Patient informed.  [place] Pharmacist Assistant [phone]

## 2021-07-24 ENCOUNTER — Ambulatory Visit (INDEPENDENT_AMBULATORY_CARE_PROVIDER_SITE_OTHER): Payer: Medicare Other

## 2021-07-24 DIAGNOSIS — Z794 Long term (current) use of insulin: Secondary | ICD-10-CM

## 2021-07-24 DIAGNOSIS — I129 Hypertensive chronic kidney disease with stage 1 through stage 4 chronic kidney disease, or unspecified chronic kidney disease: Secondary | ICD-10-CM

## 2021-07-24 DIAGNOSIS — E1122 Type 2 diabetes mellitus with diabetic chronic kidney disease: Secondary | ICD-10-CM

## 2021-07-24 NOTE — Progress Notes (Signed)
Chronic Care Management Pharmacy Note  07/24/2021 Name:  Alec Solis MRN:  [id number] DOB:  15-May-1933  Summary: Patient reports having low BS readings often recently.   Recommendations/Changes made from today's visit: Recommend patient have glucose tablets on his person. Recommended patient keep up with his appointments with the PCP team.    Plan: Collaborated with PCP team to have patient seen due to low BS readings. Patient to bring log book with his BS and BP readings. He is scheduled for 07/29/2021 at 8:30 am. Patient to decrease Tresiba by 2 units to 20 units per day.    Subjective: Alec Solis is an 87 y.o. year old male who is a primary patient of [name], MD.  The CCM team was consulted for assistance with disease management and care coordination needs.    Engaged with patient by telephone for follow up visit in response to provider referral for pharmacy case management and/or care coordination services. Patients was out of town taking care of his daughter in [place] who is going to have an operation.   Consent to Services:  The patient was given information about Chronic Care Management services, agreed to services, and gave verbal consent prior to initiation of services.  Please see initial visit note for detailed documentation.   Patient Care Team: [name], MD as PCP - General (Internal Medicine) Mayford Knife, [facility] (Pharmacist)  Recent office visits: 01/09/2021 PCP OV   Recent consult visits: 03/2021 - Dr. Clover Mealy- [facility] 03/17/2021 [facility] visits: None in previous 6 months   Objective:  Lab Results  Component Value Date   CREATININE 2.0 (A) 05/07/2021   BUN 30 (A) 05/07/2021   GFRNONAA 27 (L) 10/01/2020   GFRAA 31 (L) 10/01/2020   NA 142 05/07/2021   K 4.8 05/07/2021   CALCIUM 9.9 05/07/2021   CO2 26 (A) 05/07/2021   GLUCOSE 115 (H) 01/09/2021    Lab Results  Component Value Date/Time    HGBA1C 9.6 05/07/2021 12:00 AM   HGBA1C 7.6 (H) 01/09/2021 10:14 AM   HGBA1C 6.4 (H) 10/01/2020 12:43 PM   MICROALBUR 80 02/20/2020 11:46 AM   MICROALBUR 30 01/17/2019 12:39 PM    Last diabetic Eye exam:  Lab Results  Component Value Date/Time   HMDIABEYEEXA No Retinopathy 03/22/2020 12:00 AM    Last diabetic Foot exam: No results found for: HMDIABFOOTEX   Lab Results  Component Value Date   CHOL 115 10/24/2020   HDL 26 (L) 10/24/2020   LDLCALC 48 10/24/2020   TRIG 264 (H) 10/24/2020   CHOLHDL 4.4 10/24/2020    Hepatic Function Latest Ref Rng & Units 05/07/2021 10/01/2020 05/07/2020  Total Protein 6.0 - 8.5 g/dL - 7.6 -  Albumin 3.5 - 5.0 4.3 4.3 3.6  AST 0 - 40 IU/L - 13 -  ALT 0 - 44 IU/L - 12 -  Alk Phosphatase 44 - 121 IU/L - 130(H) -  Total Bilirubin 0.0 - 1.2 mg/dL - <0.2 -  Bilirubin, Direct 0.00 - 0.40 mg/dL - - -    No results found for: TSH, FREET4  CBC Latest Ref Rng & Units 01/09/2021 05/21/2020 05/07/2020  WBC 3.4 - 10.8 x10E3/uL 5.7 - -  Hemoglobin 13.0 - 17.7 g/dL 9.2(L) 11.0(L) 9.9(L)  Hematocrit 37.5 - 51.0 % 31.5(L) - -  Platelets 150 - 450 x10E3/uL 263 - -    No results found for: VD25OH  Clinical ASCVD: Yes  The ASCVD Risk score (  Arnett DK, et al., 2019) failed to calculate for the following reasons:   The 2019 ASCVD risk score is only valid for ages 13 to 59    Depression screen PHQ 2/9 06/17/2021 01/30/2021 09/04/2020  Decreased Interest 2 0 2  Down, Depressed, Hopeless 0 0 2  PHQ - 2 Score 2 0 4  Altered sleeping 1 - 1  Tired, decreased energy 1 - 1  Change in appetite 0 - 2  Feeling bad or failure about yourself  0 - 0  Trouble concentrating 1 - 1  Moving slowly or fidgety/restless 0 - 1  Suicidal thoughts 0 - 0  PHQ-9 Score 5 - 10  Difficult doing work/chores - - Somewhat difficult  Some recent data might be hidden     Social History   Tobacco Use  Smoking Status Former   Packs/day: 0.50   Years: 50.00   Pack years: 25.00   Types:  Cigarettes   Start date: 08/24/1952   Quit date: 08/24/2014   Years since quitting: 6.9  Smokeless Tobacco Never   BP Readings from Last 3 Encounters:  01/09/21 124/78  10/01/20 124/78  07/01/20 118/64   Pulse Readings from Last 3 Encounters:  01/09/21 72  10/01/20 66  07/01/20 84   Wt Readings from Last 3 Encounters:  01/30/21 188 lb (85.3 kg)  01/09/21 184 lb 12.8 oz (83.8 kg)  10/01/20 188 lb 12.8 oz (85.6 kg)   BMI Readings from Last 3 Encounters:  01/30/21 25.50 kg/m  01/09/21 25.06 kg/m  10/01/20 25.61 kg/m    Assessment/Interventions: Review of patient past medical history, allergies, medications, health status, including review of consultants reports, laboratory and other test data, was performed as part of comprehensive evaluation and provision of chronic care management services.   SDOH:  (Social Determinants of Health) assessments and interventions performed: No  SDOH Screenings   Alcohol Screen: Not on file  Depression (PHQ2-9): Medium Risk   PHQ-2 Score: 5  Financial Resource Strain: Low Risk    Difficulty of Paying Living Expenses: Not hard at all  Food Insecurity: No Food Insecurity   Worried About Charity fundraiser in the Last Year: Never true   Ran Out of Food in the Last Year: Never true  Housing: Not on file  Physical Activity: Sufficiently Active   Days of Exercise per Week: 6 days   Minutes of Exercise per Session: 30 min  Social Connections: Not on file  Stress: No Stress Concern Present   Feeling of Stress : Not at all  Tobacco Use: Medium Risk   Smoking Tobacco Use: Former   Smokeless Tobacco Use: Never   Passive Exposure: Not on file  Transportation Needs: No Transportation Needs   Lack of Transportation (Medical): No   Lack of Transportation (Non-Medical): No    CCM Care Plan  No Known Allergies  Medications Reviewed Today     Reviewed by Mayford Knife, RPH (Pharmacist) on 07/24/21 at 1130  Med List Status: <None>    Medication Order Taking? Sig Documenting Provider Last Dose Status Informant  acetaminophen (TYLENOL) 650 MG CR tablet [id number] Yes Take 650 mg by mouth every 8 (eight) hours as needed for pain.  [provider] Taking Active Self  allopurinol (ZYLOPRIM) 100 MG tablet [id number] Yes TAKE 1 TABLET BY MOUTH TWICE DAILY [name], MD Taking Active   amLODipine (NORVASC) 5 MG tablet [id number] Yes Take 1 tablet (5 mg total) by mouth daily. [name], MD Taking Active  aspirin 81 MG tablet [id number]  Take 81 mg by mouth daily.   Patient not taking: Reported on 01/30/2021   [provider]  Active Self  B-D ULTRAFINE III SHORT PEN 31G X 8 MM MISC [id number] Yes USE AS DIRECTED FOUR TIMES DAILY [name], MD Taking Active Self  BINAXNOW COVID-19 [place] TEST KIT [id number] Yes AS DIRECTED PER PACKAGE INSTRUCTIONS [provider] Taking Active   calcitRIOL (ROCALTROL) 0.25 MCG capsule [id number] Yes Take 0.25 mcg by mouth every other day.  [provider] Taking Active Self  carboxymethylcellulose (REFRESH PLUS) 0.5 % SOLN [id number] Yes Place 1 drop into both eyes 3 (three) times daily as needed (Watery eyes).  [provider] Taking Active Self  carvedilol (COREG) 12.5 MG tablet [id number] Yes Take 12.5 mg by mouth 2 (two) times daily with a meal. [provider] Taking Active Self  clopidogrel (PLAVIX) 75 MG tablet [id number] Yes TAKE 1 TABLET(75 MG) BY MOUTH DAILY [name], MD Taking Active   ferrous sulfate 325 (65 FE) MG tablet [id number] Yes Take 325 mg by mouth daily with breakfast. [provider] Taking Active Self  furosemide (LASIX) 40 MG tablet [id number] Yes Take 40 mg by mouth daily as needed for fluid or edema.  [provider] Taking Active Self           Med Note Gentry Roch   Wed Mar 27, 2020 10:27 AM)    glucose blood (TRUE METRIX BLOOD GLUCOSE TEST) test strip [id number] Yes Use as directed to  check blood sugars 3 times per day dx: e11.65 [name], MD Taking Active   insulin degludec (TRESIBA FLEXTOUCH) 200 UNIT/ML FlexTouch Pen [id number] Yes Inject 22 Units into the skin daily. [name], MD Taking Active   Insulin Pen Needle 32G X 8 MM MISC [id number] Yes Use as directed [name], MD Taking Active Self  Multiple Vitamins-Minerals (MULTIVITAMIN ADULT PO) [id number] Yes Take 1 tablet by mouth daily. [place] Mega men enhancement [provider] Taking Active Self           Med Note Gentry Roch   Wed Mar 27, 2020 10:37 AM)    Multiple Vitamins-Minerals (PRESERVISION AREDS 2 PO) [id number] Yes Take 1 tablet by mouth in the morning and at bedtime. [provider] Taking Active   naproxen sodium (ALEVE) 220 MG tablet [id number] Yes Take 220 mg by mouth daily as needed (Body ache). [provider] Taking Active   Omega-3 Fatty Acids (FISH OIL OMEGA-3 PO) [id number] Yes Take 1,200 mg by mouth daily.  [provider] Taking Active Self           Med Note Nadara Eaton Mar 27, 2020 10:31 AM)    omeprazole (PRILOSEC) 40 MG capsule [id number] Yes TAKE 1 CAPSULE BY MOUTH EVERY DAY BEFORE A MEAL [name], MD Taking Active   ondansetron (ZOFRAN) 4 MG tablet [id number] Yes TAKE 1 TABLET(4 MG) BY MOUTH DAILY AS NEEDED FOR NAUSEA OR VOMITING [name], MD Taking Active   rosuvastatin (CRESTOR) 20 MG tablet [id number] Yes TAKE 1 TABLET BY MOUTH DAILY ON MONDAY TO Billy Fischer, MD Taking Active   sodium bicarbonate 650 MG tablet [id number] Yes Take 1,300 mg by mouth 2 (two) times daily. [provider] Taking Active Self  TRUEplus Lancets 30G MISC [id number] Yes USE AS DIRECTED TO TEST BLOOD SUGAR THREE TIMES DAILY [name], MD Taking Active   vitamin B-12 (CYANOCOBALAMIN) 1000 MCG tablet [id number] Yes  Take 1,000 mcg by mouth daily. [provider] Taking Active Self            Patient Active Problem  List   Diagnosis Date Noted   Abnormal feces 03/20/2021   Abnormal weight loss 03/20/2021   Diarrhea 03/20/2021   Diverticular disease of colon 03/20/2021   Dysphagia 03/20/2021   Epigastric pain 03/20/2021   Hemorrhage of rectum and anus 03/20/2021   Iron deficiency anemia 03/20/2021   Nausea and vomiting 03/20/2021   Black stools 03/06/2020   Gastroesophageal reflux disease without esophagitis 03/06/2020   Paget's disease of bone 10/23/2018   Acute renal failure (ARF) ([place]) 06/11/2016   Hyperglycemia 06/11/2016   Deficiency anemia 12/09/2011   Diabetes mellitus with stage 3 chronic kidney disease ([place]) 12/09/2011   Renal insufficiency 12/09/2011    Immunization History  Administered Date(s) Administered   Influenza, High Dose Seasonal PF 05/25/2018, 04/19/2019   Influenza-Unspecified 05/24/2013, 05/31/2018, 05/07/2021   PFIZER(Purple Top)SARS-COV-2 Vaccination 10/23/2019, 11/15/2019, 09/12/2020   Pneumococcal Polysaccharide-23 05/24/2013, 05/24/2017    Conditions to be addressed/monitored:  Hypertension and Diabetes  Care Plan : [place]  Updates made by Mayford Knife, [place] since 07/24/2021 12:00 AM     Problem: HTN, DM II   Priority: High     Long-Range Goal: Disease Management   Recent Progress: On track  Priority: High  Note:   Current Barriers:  Unable to achieve control of Diabetes  Unable to maintain control of Diabetes Does not maintain contact with provider office Does not contact provider office for questions/concerns  Pharmacist Clinical Goal(s):  Patient will achieve adherence to monitoring guidelines and medication adherence to achieve therapeutic efficacy through collaboration with PharmD and provider.   Interventions: 1:1 collaboration with [name], MD regarding development and update of comprehensive plan of care as evidenced by provider attestation and co-signature Inter-disciplinary care team collaboration (see  longitudinal plan of care) Comprehensive medication review performed; medication list updated in electronic medical record  Hypertension (BP goal <140/90) -Controlled -Current treatment: Amlodipine 5 mg tablet once per day  -Medications previously tried:  -Current home readings: 112/86  -Current dietary habits: he is limiting the salt in his diet, only a small amount of sea salt.  -Current exercise habits: he is walking around the supermarket and walking up and down the aisle -Denies hypotensive/hypertensive symptoms -Educated on BP goals and benefits of medications for prevention of heart attack, stroke and kidney damage; Importance of home blood pressure monitoring; Proper BP monitoring technique; -Counseled to monitor BP at home at least two times per week, document, and provide log at future appointments -Counseled on diet and exercise extensively  Diabetes (A1c goal <8%) -Uncontrolled -Current medications: Tresiba Flextouch 200 unit/ml - inject 22 units into the skin daily -Current home glucose readings: checking his BS twice per day  fasting glucose: 11/19 - 96, 11/20-84, 70, 11/22 - 88, 11/23-56, 11/25-58 post prandial glucose: 11/19-96, 11/20-74, 97, 11/22- 78 , 11/23-76 , 11/28-64 -Denies hypoglycemic/hyperglycemic symptoms -Current meal patterns:  breakfast: oatmeal  lunch: half a sandwich and a salad  dinner:  varies, sometimes prepared meals  snacks: he is not currently snacking drinks: drinking more water -Current exercise: please see hypertension  -Educated on Complications of diabetes including kidney damage, retinal damage, and cardiovascular disease; -Counseled to check feet daily and get yearly eye exams -He reports that his A1c was high because he was not eating properly so he came home to change his diet.  -He raises his  BS with a glass of orange juice  -Collaborated with PCP patient to decrease Tresiba by 2 units per day  to [place] 20 units   -Mr. Lapaglia  voiced understanding.  -Collaborated with patient he is going to pick up glucose tablets  -Recommended to continue current medication  Health Maintenance -Vaccine gaps: COVID-19 Vaccine Booster: 05/29/2021  Flu shot [facility]-Dr. Clover Mealy office  -Current therapy:  [employer]red[employer] Ferrous Sulfate Multivitation  Fish Oil Vitamin B-12 -Educated on Supplements may interfere with prescription drugs -Patient is satisfied with current therapy and denies issues -We discussed the importance of coming to every doctors appointment and/or rescheduling if necessary.  -Mr. Nunziato is scheduled for a follow up visit at [facility] for 07/29/2021 at 8:30 am.   Patient Goals/Self-Care Activities Patient will:  - take medications as prescribed as evidenced by patient report and record review  Follow Up Plan: The patient has been provided with contact information for the care management team and has been advised to call with any health related questions or concerns.       Medication Assistance:  [name] obtained through [facility] medication assistance program.  Enrollment ends 07/2021  Compliance/Adherence/Medication fill history: Care Gaps: Urine Microalbumin due Opthamology Exam - completed in August with Dr. Kathlen Mody COVID-19 Vaccine Booster- 05/28/2021  Pneumonia Vaccine  Shingrix Vaccine   Star-Rating Drugs: Rosuvastatin 20 mg tablet   Patient's preferred pharmacy is:  [facility] [place] [place] [address] Phone: [phone] Fax: [phone]  Uses pill box? Yes Pt endorses 95% compliance  We discussed: Benefits of medication synchronization, packaging and delivery as well as enhanced pharmacist oversight with [employer]. Patient decided to: Continue current medication management strategy  Care Plan and Follow Up Patient Decision:  Patient agrees to Care Plan and  Follow-up.  Plan: The patient has been provided with contact information for the care management team and has been advised to call with any health related questions or concerns.   Orlando Penner, CPP, PharmD Clinical Pharmacist Practitioner [facility] [phone]

## 2021-07-24 NOTE — Patient Instructions (Addendum)
Visit Information It was great speaking with you today!  Please let me know if you have any questions about our visit.   Goals Addressed             This Visit's Progress    Manage My Medicine       Timeframe:  Long-Range Goal Priority:  High Start Date:                             Expected End Date:                       Follow Up Date : 09/09/2021   In Progress:  - call for medicine refill 2 or 3 days before it runs out - call if I am sick and can't take my medicine - keep a list of all the medicines I take; vitamins and herbals too - use an alarm clock or phone to remind me to take my medicine    Why is this important?   These steps will help you keep on track with your medicines.   Notes:  -Please continue to log your BS readings everyday         Patient Care Plan: [place] Plan     Problem Identified: HLD, DM II   Priority: High     Long-Range Goal: Disease Management   Recent Progress: On track  Priority: High  Note:   Current Barriers:  Unable to achieve control of Diabetes  Unable to maintain control of Diabetes Does not maintain contact with provider office Does not contact provider office for questions/concerns  Pharmacist Clinical Goal(s):  Patient will achieve adherence to monitoring guidelines and medication adherence to achieve therapeutic efficacy through collaboration with PharmD and provider.   Interventions: 1:1 collaboration with [name], MD regarding development and update of comprehensive plan of care as evidenced by provider attestation and co-signature Inter-disciplinary care team collaboration (see longitudinal plan of care) Comprehensive medication review performed; medication list updated in electronic medical record  Hypertension (BP goal <140/90) -Controlled -Current treatment: Amlodipine 5 mg tablet once per day  -Medications previously tried:  -Current home readings: 112/86  -Current dietary habits: he is  limiting the salt in his diet, only a small amount of sea salt.  -Current exercise habits: he is walking around the supermarket and walking up and down the aisle -Denies hypotensive/hypertensive symptoms -Educated on BP goals and benefits of medications for prevention of heart attack, stroke and kidney damage; Importance of home blood pressure monitoring; Proper BP monitoring technique; -Counseled to monitor BP at home at least two times per week, document, and provide log at future appointments -Counseled on diet and exercise extensively  Diabetes (A1c goal <8%) -Uncontrolled -Current medications: Tresiba Flextouch 200 unit/ml - inject 22 units into the skin daily -Current home glucose readings: checking his BS twice per day  fasting glucose: 11/19 - 96, 11/20-84, 70, 11/22 - 88, 11/23-56, 11/25-58 post prandial glucose: 11/19-96, 11/20-74, 97, 11/22- 78 , 11/23-76 , 11/28-64 -Denies hypoglycemic/hyperglycemic symptoms -Current meal patterns:  breakfast: oatmeal  lunch: half a sandwich and a salad  dinner:  varies, sometimes prepared meals  snacks: he is not currently snacking drinks: drinking more water -Current exercise: please see hypertension  -Educated on Complications of diabetes including kidney damage, retinal damage, and cardiovascular disease; -Counseled to check feet daily and get yearly eye exams -He reports that his A1c was high  because he was not eating properly so he came home to change his diet.  -He raises his BS with a glass of orange juice  -Collaborated with PCP patient to decrease Tresiba by 2 units per day  to [place] 20 units   -Mr. Dugue voiced understanding.  -Collaborated with patient he is going to pick up glucose tablets  -Recommended to continue current medication  Health Maintenance -Vaccine gaps: COVID-19 Vaccine Booster: 05/29/2021  Flu shot HD-Dr. Clover Mealy office  -Current therapy:  Areds Ferrous Sulfate Multivitation  Fish Oil Vitamin  B-12 -Educated on Supplements may interfere with prescription drugs -Patient is satisfied with current therapy and denies issues -We discussed the importance of coming to every doctors appointment and/or rescheduling if necessary.  -Mr. Sulewski is scheduled for a follow up visit at [place] for 07/29/2021 at 8:30 am.   Patient Goals/Self-Care Activities Patient will:  - take medications as prescribed as evidenced by patient report and record review  Follow Up Plan: The patient has been provided with contact information for the care management team and has been advised to call with any health related questions or concerns.       Patient agreed to services and verbal consent obtained.   The patient verbalized understanding of instructions, educational materials, and care plan provided today and agreed to receive a mailed copy of patient instructions, educational materials, and care plan.   Orlando Penner, PharmD Clinical Pharmacist [facility] [phone]

## 2021-07-29 ENCOUNTER — Ambulatory Visit (INDEPENDENT_AMBULATORY_CARE_PROVIDER_SITE_OTHER): Payer: Medicare Other | Admitting: Nurse Practitioner

## 2021-07-29 ENCOUNTER — Other Ambulatory Visit: Payer: Self-pay

## 2021-07-29 ENCOUNTER — Encounter: Payer: Self-pay | Admitting: Nurse Practitioner

## 2021-07-29 VITALS — BP 114/78 | HR 90 | Temp 98.7°F | Ht 68.8 in | Wt 190.6 lb

## 2021-07-29 DIAGNOSIS — N1832 Chronic kidney disease, stage 3b: Secondary | ICD-10-CM

## 2021-07-29 DIAGNOSIS — Z794 Long term (current) use of insulin: Secondary | ICD-10-CM | POA: Diagnosis not present

## 2021-07-29 DIAGNOSIS — I129 Hypertensive chronic kidney disease with stage 1 through stage 4 chronic kidney disease, or unspecified chronic kidney disease: Secondary | ICD-10-CM

## 2021-07-29 DIAGNOSIS — Z6828 Body mass index (BMI) 28.0-28.9, adult: Secondary | ICD-10-CM

## 2021-07-29 DIAGNOSIS — E1165 Type 2 diabetes mellitus with hyperglycemia: Secondary | ICD-10-CM | POA: Diagnosis not present

## 2021-07-29 DIAGNOSIS — E1122 Type 2 diabetes mellitus with diabetic chronic kidney disease: Secondary | ICD-10-CM

## 2021-07-29 DIAGNOSIS — Z23 Encounter for immunization: Secondary | ICD-10-CM

## 2021-07-29 LAB — POCT UA - MICROALBUMIN
Albumin/Creatinine Ratio, Urine, POC: <30
Creatinine, POC: 100 mg/dL
Microalbumin Ur, POC: 30 mg/L

## 2021-07-29 MED ORDER — SHINGRIX 50 MCG/0.5ML IM SUSR: 0.5000 mL | Freq: Once | INTRAMUSCULAR | 0 refills | Status: AC

## 2021-07-29 MED ORDER — PREVNAR 20 0.5 ML IM SUSY: 0.5000 mL | PREFILLED_SYRINGE | INTRAMUSCULAR | 0 refills | Status: AC

## 2021-07-29 NOTE — Patient Instructions (Signed)

## 2021-07-29 NOTE — Progress Notes (Signed)
Rich Brave [place],acting as a Education administrator for [name], FNP.,have documented all relevant documentation on the behalf of [name], FNP,as directed by  [name], FNP while in the presence of [name], [place].  This visit occurred during the SARS-CoV-2 public health emergency.  Safety protocols were in place, including screening questions prior to the visit, additional usage of staff PPE, and extensive cleaning of exam room while observing appropriate contact time as indicated for disinfecting solutions.  Subjective:     Patient ID: Alec Solis , male    DOB: 05/25/1933 , 87 y.o.   MRN: [id number]   Chief Complaint  Patient presents with   Diabetes   Hypertension    HPI  He presents today for diabetes and blood pressure f/u. He has been to see Dr. Clover Mealy and went well, no changes.   Wt Readings from Last 3 Encounters: 07/29/21 : 190 lb 9.6 oz (86.5 kg) 01/30/21 : 188 lb (85.3 kg) 01/09/21 : 184 lb 12.8 oz (83.8 kg)    Diabetes He presents for his follow-up diabetic visit. He has type 2 diabetes mellitus. Pertinent negatives for hypoglycemia include no dizziness or headaches. Pertinent negatives for diabetes include no blurred vision, no chest pain, no fatigue, no polydipsia, no polyphagia and no polyuria. There are no hypoglycemic complications. There are no diabetic complications. He is following a generally healthy diet. When asked about meal planning, he reported none. He has not had a previous visit with a dietitian. He participates in exercise intermittently. (He reports his blood sugar has been good this week 54-190.  When it was 8 he had just awakened for the morning.  He was out of town at the time so he was not eating the same. He was to decrease to 20 units [place] but he remained at 22 units and has come back up. ) Eye exam is current.  Hypertension This is a chronic problem. The current episode started more than 1 year ago. The problem has been gradually  improving since onset. The problem is controlled. Pertinent negatives include no blurred vision, chest pain, headaches, palpitations or shortness of breath. Risk factors for coronary artery disease include diabetes mellitus, dyslipidemia, sedentary lifestyle and male gender. Identifiable causes of hypertension include chronic renal disease.  Hyperlipidemia Exacerbating diseases include chronic renal disease and diabetes. Pertinent negatives include no chest pain or shortness of breath. Current antihyperlipidemic treatment includes statins. Compliance problems include adherence to exercise.  Risk factors for coronary artery disease include diabetes mellitus, dyslipidemia, male sex, hypertension and a sedentary lifestyle.    Past Medical History:  Diagnosis Date   Anemia    Anxiety    Arthritis    Cataract    Chronic kidney disease    Depression    Diabetes mellitus without complication (HCC)    type II    GERD (gastroesophageal reflux disease)    Gout    Heart murmur    Hypertension    Myocardial infarction ([place])    minor Mi- years ago -    Varicose veins of left lower extremity      Family History  Problem Relation Age of Onset   Breast cancer Mother    Stomach cancer Mother    Diabetes Mellitus II Maternal Aunt      Current Outpatient Medications:    acetaminophen (TYLENOL) 650 MG CR tablet, Take 650 mg by mouth every 8 (eight) hours as needed for pain. , Disp: , Rfl:    allopurinol (ZYLOPRIM) 100 MG  tablet, TAKE 1 TABLET BY MOUTH TWICE DAILY, Disp: 180 tablet, Rfl: 0   amLODipine (NORVASC) 5 MG tablet, Take 1 tablet (5 mg total) by mouth daily., Disp: 90 tablet, Rfl: 1   aspirin 81 MG tablet, Take 81 mg by mouth daily. , Disp: , Rfl:    B-D ULTRAFINE III SHORT PEN 31G X 8 MM MISC, USE AS DIRECTED FOUR TIMES DAILY, Disp: 100 each, Rfl: 3   calcitRIOL (ROCALTROL) 0.25 MCG capsule, Take 0.25 mcg by mouth every other day. , Disp: , Rfl:    carboxymethylcellulose (REFRESH PLUS) 0.5  % SOLN, Place 1 drop into both eyes 3 (three) times daily as needed (Watery eyes). , Disp: , Rfl:    carvedilol (COREG) 12.5 MG tablet, Take 12.5 mg by mouth 2 (two) times daily with a meal., Disp: , Rfl:    clopidogrel (PLAVIX) 75 MG tablet, TAKE 1 TABLET(75 MG) BY MOUTH DAILY, Disp: 90 tablet, Rfl: 2   ferrous sulfate 325 (65 FE) MG tablet, Take 325 mg by mouth daily with breakfast., Disp: , Rfl:    furosemide (LASIX) 40 MG tablet, Take 40 mg by mouth daily as needed for fluid or edema. , Disp: , Rfl:    glucose blood (TRUE METRIX BLOOD GLUCOSE TEST) test strip, Use as directed to check blood sugars 3 times per day dx: e11.65, Disp: 300 each, Rfl: 5   insulin degludec (TRESIBA FLEXTOUCH) 200 UNIT/ML FlexTouch Pen, Inject 22 Units into the skin daily., Disp: 27 mL, Rfl: 1   Insulin Pen Needle 32G X 8 MM MISC, Use as directed, Disp: 100 each, Rfl: 2   naproxen sodium (ALEVE) 220 MG tablet, Take 220 mg by mouth daily as needed (Body ache)., Disp: , Rfl:    Omega-3 Fatty Acids (FISH OIL OMEGA-3 PO), Take 1,200 mg by mouth daily. , Disp: , Rfl:    omeprazole (PRILOSEC) 40 MG capsule, TAKE 1 CAPSULE BY MOUTH EVERY DAY BEFORE A MEAL, Disp: 90 capsule, Rfl: 1   ondansetron (ZOFRAN) 4 MG tablet, TAKE 1 TABLET(4 MG) BY MOUTH DAILY AS NEEDED FOR NAUSEA OR VOMITING, Disp: 30 tablet, Rfl: 1   rosuvastatin (CRESTOR) 20 MG tablet, TAKE 1 TABLET BY MOUTH DAILY ON MONDAY TO FRIDAY, Disp: 75 tablet, Rfl: 1   sodium bicarbonate 650 MG tablet, Take 1,300 mg by mouth 2 (two) times daily., Disp: , Rfl:    TRUEplus Lancets 30G MISC, USE AS DIRECTED TO TEST BLOOD SUGAR THREE TIMES DAILY, Disp: 300 each, Rfl: 11   vitamin B-12 (CYANOCOBALAMIN) 1000 MCG tablet, Take 1,000 mcg by mouth daily., Disp: , Rfl:    Zoster Vaccine Adjuvanted (SHINGRIX) injection, Inject 0.5 mLs into the muscle once for 1 dose., Disp: 0.5 mL, Rfl: 0   BINAXNOW COVID-19 AG HOME TEST KIT, AS DIRECTED PER PACKAGE INSTRUCTIONS, Disp: , Rfl:     Multiple Vitamins-Minerals (MULTIVITAMIN ADULT PO), Take 1 tablet by mouth daily. [employer] Mega men enhancement (Patient not taking: Reported on 07/29/2021), Disp: , Rfl:    Multiple Vitamins-Minerals (PRESERVISION AREDS 2 PO), Take 1 tablet by mouth in the morning and at bedtime. (Patient not taking: Reported on 07/29/2021), Disp: , Rfl:    No Known Allergies   Review of Systems  Constitutional:  Negative for fatigue.  Eyes:  Negative for blurred vision.  Respiratory:  Negative for shortness of breath.   Cardiovascular:  Negative for chest pain and palpitations.  Endocrine: Negative for polydipsia, polyphagia and polyuria.  Neurological:  Negative for dizziness and   headaches.    Today's Vitals   07/29/21 0841  BP: 114/78  Pulse: 90  Temp: 98.7 F (37.1 C)  Weight: 190 lb 9.6 oz (86.5 kg)  Height: 5' 8.8" (1.748 m)  PainSc: 0-No pain   Body mass index is 28.31 kg/m.   Objective:  Physical Exam Vitals reviewed.  Constitutional:      General: He is not in acute distress.    Appearance: Normal appearance.  Cardiovascular:     Rate and Rhythm: Normal rate and regular rhythm.     Pulses: Normal pulses.     Heart sounds: Normal heart sounds. No murmur heard. Pulmonary:     Effort: Pulmonary effort is normal. No respiratory distress.     Breath sounds: Normal breath sounds. No wheezing.  Musculoskeletal:     Right lower leg: No edema.     Left lower leg: Edema (trace - normal for him) present.  Skin:    General: Skin is warm and dry.     Capillary Refill: Capillary refill takes less than 2 seconds.  Neurological:     General: No focal deficit present.     Mental Status: He is alert and oriented to person, place, and time.     Cranial Nerves: No cranial nerve deficit.     Motor: No weakness.  Psychiatric:        Mood and Affect: Mood normal.        Behavior: Behavior normal.        Thought Content: Thought content normal.        Judgment: Judgment normal.        Assessment  And Plan:     1. Type 2 diabetes mellitus with stage 3b chronic kidney disease, with long-term current use of insulin ([place]) Comments: He has been having a few low blood sugars, will check HgbA1c and may decrease his [place]. Request records from Dr. Kathlen Mody Opthal[facility] - POCT UA - Microalbumin - Hemoglobin A1c  2. Hypertensive nephropathy Comments: Blood pressure is well controlled, continue current medications and follow up with Dr. Clover Mealy - CMP14+EGFR  3. BMI 28.0-28.9,adult  4. Immunization due Comments: Rx's sent to pharmacy - Pneumococcal conjugate vaccine 20-valent (Prevnar-20) - Zoster Vaccine Adjuvanted [facility]) injection; Inject 0.5 mLs into the muscle once for 1 dose.  Dispense: 0.5 mL; Refill: 0     Patient was given opportunity to ask questions. Patient verbalized understanding of the plan and was able to repeat key elements of the plan. All questions were answered to their satisfaction.  [name], FNP   I, [name], FNP, have reviewed all documentation for this visit. The documentation on 07/29/21 for the exam, diagnosis, procedures, and orders are all accurate and complete.   IF YOU HAVE BEEN REFERRED TO A SPECIALIST, IT MAY TAKE 1-2 WEEKS TO SCHEDULE/PROCESS THE REFERRAL. IF YOU HAVE NOT HEARD FROM US/SPECIALIST IN TWO WEEKS, PLEASE GIVE [place] A CALL AT [phone].   THE PATIENT IS ENCOURAGED TO PRACTICE SOCIAL DISTANCING DUE TO THE COVID-19 PANDEMIC.

## 2021-07-30 LAB — CMP14+EGFR
ALT: 13 IU/L (ref 0–44)
AST: 18 IU/L (ref 0–40)
Albumin/Globulin Ratio: 1.6 (ref 1.2–2.2)
Albumin: 4.7 g/dL — ABNORMAL HIGH (ref 3.6–4.6)
Alkaline Phosphatase: 145 IU/L — ABNORMAL HIGH (ref 44–121)
BUN/Creatinine Ratio: 14 (ref 10–24)
BUN: 30 mg/dL — ABNORMAL HIGH (ref 8–27)
Bilirubin Total: 0.3 mg/dL (ref 0.0–1.2)
CO2: 23 mmol/L (ref 20–29)
Calcium: 10 mg/dL (ref 8.6–10.2)
Chloride: 107 mmol/L — ABNORMAL HIGH (ref 96–106)
Creatinine, Ser: 2.07 mg/dL — ABNORMAL HIGH (ref 0.76–1.27)
Globulin, Total: 3 g/dL (ref 1.5–4.5)
Glucose: 117 mg/dL — ABNORMAL HIGH (ref 70–99)
Potassium: 5.4 mmol/L — ABNORMAL HIGH (ref 3.5–5.2)
Sodium: 142 mmol/L (ref 134–144)
Total Protein: 7.7 g/dL (ref 6.0–8.5)
eGFR: 31 mL/min/{1.73_m2} — ABNORMAL LOW (ref >59)

## 2021-07-30 LAB — HEMOGLOBIN A1C
Est. average glucose Bld gHb Est-mCnc: 131 mg/dL
Hgb A1c MFr Bld: 6.2 % — ABNORMAL HIGH (ref 4.8–5.6)

## 2021-08-06 ENCOUNTER — Other Ambulatory Visit: Payer: Self-pay | Admitting: Internal Medicine

## 2021-08-07 ENCOUNTER — Other Ambulatory Visit: Payer: Self-pay | Admitting: Internal Medicine

## 2021-08-11 ENCOUNTER — Other Ambulatory Visit: Payer: Self-pay | Admitting: Internal Medicine

## 2021-08-16 ENCOUNTER — Other Ambulatory Visit: Payer: Self-pay | Admitting: Internal Medicine

## 2021-08-23 DIAGNOSIS — I129 Hypertensive chronic kidney disease with stage 1 through stage 4 chronic kidney disease, or unspecified chronic kidney disease: Secondary | ICD-10-CM

## 2021-08-23 DIAGNOSIS — N1832 Chronic kidney disease, stage 3b: Secondary | ICD-10-CM | POA: Diagnosis not present

## 2021-08-23 DIAGNOSIS — Z794 Long term (current) use of insulin: Secondary | ICD-10-CM | POA: Diagnosis not present

## 2021-08-23 DIAGNOSIS — E1122 Type 2 diabetes mellitus with diabetic chronic kidney disease: Secondary | ICD-10-CM | POA: Diagnosis not present

## 2021-09-02 ENCOUNTER — Other Ambulatory Visit: Payer: Self-pay | Admitting: Internal Medicine

## 2021-09-09 ENCOUNTER — Telehealth: Payer: Medicare Other

## 2021-09-09 NOTE — Progress Notes (Deleted)
Current Barriers:  {pharmacybarriers:24917}  Pharmacist Clinical Goal(s):  Patient will {PHARMACYGOALCHOICES:24921} through collaboration with PharmD and provider.   Interventions: 1:1 collaboration with [name], MD regarding development and update of comprehensive plan of care as evidenced by provider attestation and co-signature Inter-disciplinary care team collaboration (see longitudinal plan of care) Comprehensive medication review performed; medication list updated in electronic medical record  {CCM [facility] DISEASE STATES:25130}  Patient Goals/Self-Care Activities Patient will:  - {pharmacypatientgoals:24919}  Follow Up Plan: {CM FOLLOW UP YSAY:30160}  Chronic Care Management Pharmacy Note  09/09/2021 Name:  Alec Solis MRN:  [id number] DOB:  09-23-1932  Summary: ***  Recommendations/Changes made from today's visit: ***  Plan: ***   Subjective: Alec Solis is an 88 y.o. year old male who is a primary patient of [name], MD.  The CCM team was consulted for assistance with disease management and care coordination needs.    {CCMTELEPHONEFACETOFACE:21091510} for {CCMINITIALFOLLOWUPCHOICE:21091511} in response to provider referral for pharmacy case management and/or care coordination services.   Consent to Services:  {CCMCONSENTOPTIONS:25074}  Patient Care Team: [name], MD as PCP - General (Internal Medicine) Mayford Knife, [facility] (Pharmacist)  Recent office visits: ***  Recent consult visits: [facility] visits: {Hospital DC Yes/No:25215}   Objective:  Lab Results  Component Value Date   CREATININE 2.07 (H) 07/29/2021   BUN 30 (H) 07/29/2021   GFRNONAA 27 (L) 10/01/2020   GFRAA 31 (L) 10/01/2020   NA 142 07/29/2021   K 5.4 (H) 07/29/2021   CALCIUM 10.0 07/29/2021   CO2 23 07/29/2021   GLUCOSE 117 (H) 07/29/2021    Lab Results  Component Value Date/Time   HGBA1C 6.2 (H) 07/29/2021 09:04 AM   HGBA1C 9.6 05/07/2021 12:00  AM   HGBA1C 7.6 (H) 01/09/2021 10:14 AM   MICROALBUR 30 07/29/2021 09:47 AM   MICROALBUR 80 02/20/2020 11:46 AM    Last diabetic Eye exam:  Lab Results  Component Value Date/Time   HMDIABEYEEXA No Retinopathy 03/22/2020 12:00 AM    Last diabetic Foot exam: No results found for: HMDIABFOOTEX   Lab Results  Component Value Date   CHOL 115 10/24/2020   HDL 26 (L) 10/24/2020   LDLCALC 48 10/24/2020   TRIG 264 (H) 10/24/2020   CHOLHDL 4.4 10/24/2020    Hepatic Function Latest Ref Rng & Units 07/29/2021 05/07/2021 10/01/2020  Total Protein 6.0 - 8.5 g/dL 7.7 - 7.6  Albumin 3.6 - 4.6 g/dL 4.7(H) 4.3 4.3  AST 0 - 40 IU/L 18 - 13  ALT 0 - 44 IU/L 13 - 12  Alk Phosphatase 44 - 121 IU/L 145(H) - 130(H)  Total Bilirubin 0.0 - 1.2 mg/dL 0.3 - <0.2  Bilirubin, Direct 0.00 - 0.40 mg/dL - - -    No results found for: TSH, FREET4  CBC Latest Ref Rng & Units 01/09/2021 05/21/2020 05/07/2020  WBC 3.4 - 10.8 x10E3/uL 5.7 - -  Hemoglobin 13.0 - 17.7 g/dL 9.2(L) 11.0(L) 9.9(L)  Hematocrit 37.5 - 51.0 % 31.5(L) - -  Platelets 150 - 450 x10E3/uL 263 - -    No results found for: VD25OH  Clinical ASCVD: {YES/NO:21197} The ASCVD Risk score (Arnett DK, et al., 2019) failed to calculate for the following reasons:   The 2019 ASCVD risk score is only valid for ages 36 to 38    Depression screen PHQ 2/9 06/17/2021 01/30/2021 09/04/2020  Decreased Interest 2 0 2  Down, Depressed, Hopeless 0 0 2  PHQ - 2 Score 2 0 4  Altered  sleeping 1 - 1  Tired, decreased energy 1 - 1  Change in appetite 0 - 2  Feeling bad or failure about yourself  0 - 0  Trouble concentrating 1 - 1  Moving slowly or fidgety/restless 0 - 1  Suicidal thoughts 0 - 0  PHQ-9 Score 5 - 10  Difficult doing work/chores - - Somewhat difficult  Some recent data might be hidden     ***Other: (CHADS2VASc if Afib, MMRC or CAT for COPD, ACT, DEXA)  Social History   Tobacco Use  Smoking Status Former   Packs/day: 0.50   Years: 50.00    Pack years: 25.00   Types: Cigarettes   Start date: 08/24/1952   Quit date: 08/24/2014   Years since quitting: 7.0  Smokeless Tobacco Never   BP Readings from Last 3 Encounters:  07/29/21 114/78  01/09/21 124/78  10/01/20 124/78   Pulse Readings from Last 3 Encounters:  07/29/21 90  01/09/21 72  10/01/20 66   Wt Readings from Last 3 Encounters:  07/29/21 190 lb 9.6 oz (86.5 kg)  01/30/21 188 lb (85.3 kg)  01/09/21 184 lb 12.8 oz (83.8 kg)   BMI Readings from Last 3 Encounters:  07/29/21 28.31 kg/m  01/30/21 25.50 kg/m  01/09/21 25.06 kg/m    Assessment/Interventions: Review of patient past medical history, allergies, medications, health status, including review of consultants reports, laboratory and other test data, was performed as part of comprehensive evaluation and provision of chronic care management services.   SDOH:  (Social Determinants of Health) assessments and interventions performed: {yes/no:20286}  SDOH Screenings   Alcohol Screen: Not on file  Depression (PHQ2-9): Medium Risk   PHQ-2 Score: 5  Financial Resource Strain: Low Risk    Difficulty of Paying Living Expenses: Not hard at all  Food Insecurity: No Food Insecurity   Worried About Charity fundraiser in the Last Year: Never true   Ran Out of Food in the Last Year: Never true  Housing: Not on file  Physical Activity: Sufficiently Active   Days of Exercise per Week: 6 days   Minutes of Exercise per Session: 30 min  Social Connections: Not on file  Stress: No Stress Concern Present   Feeling of Stress : Not at all  Tobacco Use: Medium Risk   Smoking Tobacco Use: Former   Smokeless Tobacco Use: Never   Passive Exposure: Not on file  Transportation Needs: No Transportation Needs   Lack of Transportation (Medical): No   Lack of Transportation (Non-Medical): No    CCM Care Plan  No Known Allergies  Medications Reviewed Today     Reviewed by [name], FNP (Family Nurse Practitioner) on  07/29/21 at 0857  Med List Status: <None>   Medication Order Taking? Sig Documenting Provider Last Dose Status Informant  acetaminophen (TYLENOL) 650 MG CR tablet [id number] Yes Take 650 mg by mouth every 8 (eight) hours as needed for pain.  [provider] Taking Active Self  allopurinol (ZYLOPRIM) 100 MG tablet [id number] Yes TAKE 1 TABLET BY MOUTH TWICE DAILY [name], MD Taking Active   amLODipine (NORVASC) 5 MG tablet [id number] Yes Take 1 tablet (5 mg total) by mouth daily. [name], MD Taking Active   aspirin 81 MG tablet [id number] Yes Take 81 mg by mouth daily.  [provider] Taking Active Self  B-D ULTRAFINE III SHORT PEN 31G X 8 MM MISC [id number] Yes USE AS DIRECTED FOUR TIMES DAILY [name], MD Taking Active Self  [facility] TEST KIT [id number]  AS DIRECTED PER PACKAGE INSTRUCTIONS [provider]  Active   calcitRIOL (ROCALTROL) 0.25 MCG capsule [id number] Yes Take 0.25 mcg by mouth every other day.  [provider] Taking Active Self  carboxymethylcellulose (REFRESH PLUS) 0.5 % SOLN [id number] Yes Place 1 drop into both eyes 3 (three) times daily as needed (Watery eyes).  [provider] Taking Active Self  carvedilol (COREG) 12.5 MG tablet [id number] Yes Take 12.5 mg by mouth 2 (two) times daily with a meal. [provider] Taking Active Self  clopidogrel (PLAVIX) 75 MG tablet [id number] Yes TAKE 1 TABLET(75 MG) BY MOUTH DAILY [name], MD Taking Active   ferrous sulfate 325 (65 FE) MG tablet [id number] Yes Take 325 mg by mouth daily with breakfast. [provider] Taking Active Self  furosemide (LASIX) 40 MG tablet [id number] Yes Take 40 mg by mouth daily as needed for fluid or edema.  [provider] Taking Active Self           Med Note Gentry Roch   Wed Mar 27, 2020 10:27 AM)    glucose blood (TRUE METRIX BLOOD GLUCOSE TEST) test strip [id number] Yes Use as directed to check  blood sugars 3 times per day dx: e11.65 [name], MD Taking Active   insulin degludec (TRESIBA FLEXTOUCH) 200 UNIT/ML FlexTouch Pen [id number] Yes Inject 22 Units into the skin daily. [name], MD Taking Active   Insulin Pen Needle 32G X 8 MM MISC [id number] Yes Use as directed [name], MD Taking Active Self  Multiple Vitamins-Minerals (MULTIVITAMIN ADULT PO) [id number] No Take 1 tablet by mouth daily. [employer] Mega men enhancement  Patient not taking: Reported on 07/29/2021   [provider] Not Taking Active Self           Med Note Nadara Eaton Mar 27, 2020 10:37 AM)    Multiple Vitamins-Minerals (PRESERVISION AREDS 2 PO) [id number] No Take 1 tablet by mouth in the morning and at bedtime.  Patient not taking: Reported on 07/29/2021   [provider] Not Taking Active   naproxen sodium (ALEVE) 220 MG tablet [id number] Yes Take 220 mg by mouth daily as needed (Body ache). [provider] Taking Active   Omega-3 Fatty Acids (FISH OIL OMEGA-3 PO) [id number] Yes Take 1,200 mg by mouth daily.  [provider] Taking Active Self           Med Note Nadara Eaton Mar 27, 2020 10:31 AM)    omeprazole (PRILOSEC) 40 MG capsule [id number] Yes TAKE 1 CAPSULE BY MOUTH EVERY DAY BEFORE A MEAL [name], MD Taking Active   ondansetron (ZOFRAN) 4 MG tablet [id number] Yes TAKE 1 TABLET(4 MG) BY MOUTH DAILY AS NEEDED FOR NAUSEA OR VOMITING [name], MD Taking Active   rosuvastatin (CRESTOR) 20 MG tablet [id number] Yes TAKE 1 TABLET BY MOUTH DAILY ON MONDAY TO Billy Fischer, MD Taking Active   sodium bicarbonate 650 MG tablet [id number] Yes Take 1,300 mg by mouth 2 (two) times daily. [provider] Taking Active Self  TRUEplus Lancets 30G MISC [id number] Yes USE AS DIRECTED TO TEST BLOOD SUGAR THREE TIMES DAILY [name], MD Taking Active   vitamin B-12 (CYANOCOBALAMIN) 1000 MCG tablet [id number] Yes Take 1,000 mcg by mouth  daily. [provider] Taking Active Self  Zoster Vaccine Adjuvanted [facility]) injection [id number] Yes Inject 0.5 mLs into the muscle once for 1 dose. [name], FNP  Active             Patient Active Problem List   Diagnosis Date Noted   Abnormal feces 03/20/2021   Abnormal weight loss 03/20/2021   Diarrhea 03/20/2021   Diverticular disease of colon 03/20/2021   Dysphagia 03/20/2021   Epigastric pain 03/20/2021   Hemorrhage of rectum and anus 03/20/2021   Iron deficiency anemia 03/20/2021   Nausea and vomiting 03/20/2021   Black stools 03/06/2020   Gastroesophageal reflux disease without esophagitis 03/06/2020   Paget's disease of bone 10/23/2018   Acute renal failure (ARF) ([place]) 06/11/2016   Hyperglycemia 06/11/2016   Deficiency anemia 12/09/2011   Diabetes mellitus with stage 3 chronic kidney disease ([place]) 12/09/2011   Renal insufficiency 12/09/2011    Immunization History  Administered Date(s) Administered   Influenza, High Dose Seasonal PF 05/25/2018, 04/19/2019   Influenza-Unspecified 05/24/2013, 05/31/2018, 05/07/2021   PFIZER(Purple Top)SARS-COV-2 Vaccination 10/23/2019, 11/15/2019, 09/12/2020, 05/29/2021   Pneumococcal Polysaccharide-23 05/24/2013, 05/24/2017    Conditions to be addressed/monitored:  {USCCMDZASSESSMENTOPTIONS:23563}  There are no care plans that you recently modified to display for this patient.    Medication Assistance: {MEDASSISTANCEINFO:25044}  Compliance/Adherence/Medication fill history: Care Gaps: ***  Star-Rating Drugs: ***  Patient's preferred pharmacy is:  [facility] [place], [address] [place] [address] Phone: [phone] Fax: [phone]  Uses pill box? {Yes or If no, why not?:20788} Pt endorses ***% compliance  We discussed: {Pharmacy options:24294} Patient decided to: {US Pharmacy Plan:23885}  Care  Plan and Follow Up Patient Decision:  {FOLLOWUP:24991}  Plan: {CM FOLLOW UP PLAN:25073}  ***

## 2021-09-10 ENCOUNTER — Encounter: Payer: Self-pay | Admitting: Nurse Practitioner

## 2021-09-10 ENCOUNTER — Other Ambulatory Visit: Payer: Self-pay

## 2021-09-10 ENCOUNTER — Encounter: Payer: Medicare Other | Admitting: Nurse Practitioner

## 2021-09-10 ENCOUNTER — Telehealth: Payer: Medicare Other

## 2021-09-10 ENCOUNTER — Ambulatory Visit (INDEPENDENT_AMBULATORY_CARE_PROVIDER_SITE_OTHER): Payer: Medicare Other | Admitting: Nurse Practitioner

## 2021-09-10 ENCOUNTER — Telehealth: Payer: Self-pay

## 2021-09-10 VITALS — BP 120/70 | HR 82 | Temp 98.3°F | Ht 68.8 in | Wt 184.0 lb

## 2021-09-10 DIAGNOSIS — E1122 Type 2 diabetes mellitus with diabetic chronic kidney disease: Secondary | ICD-10-CM | POA: Diagnosis not present

## 2021-09-10 DIAGNOSIS — F329 Major depressive disorder, single episode, unspecified: Secondary | ICD-10-CM | POA: Diagnosis not present

## 2021-09-10 DIAGNOSIS — Z794 Long term (current) use of insulin: Secondary | ICD-10-CM | POA: Diagnosis not present

## 2021-09-10 DIAGNOSIS — N1832 Chronic kidney disease, stage 3b: Secondary | ICD-10-CM | POA: Diagnosis not present

## 2021-09-10 DIAGNOSIS — F4321 Adjustment disorder with depressed mood: Secondary | ICD-10-CM | POA: Diagnosis not present

## 2021-09-10 MED ORDER — TRESIBA FLEXTOUCH 200 UNIT/ML ~~LOC~~ SOPN: 18.0000 [IU] | PEN_INJECTOR | Freq: Every day | SUBCUTANEOUS | 1 refills | Status: DC

## 2021-09-10 NOTE — Patient Instructions (Signed)
Type 2 Diabetes Mellitus, Diagnosis, Adult °Type 2 diabetes (type 2 diabetes mellitus) is a long-term (chronic) disease. It may happen when there is one or both of these problems: °The pancreas does not make enough insulin. °The body does not react in a normal way to insulin that it makes. °Insulin lets sugars go into cells in your body. If you have type 2 diabetes, sugars cannot get into your cells. Sugars build up in the blood. This causes high blood sugar. °What are the causes? °The exact cause of this condition is not known. °What increases the risk? °Having type 2 diabetes in your family. °Being overweight or very overweight. °Not being active. °Your body not reacting in a normal way to the insulin it makes. °Having higher than normal blood sugar over time. °Having a type of diabetes when you were pregnant. °Having a condition that causes small fluid-filled sacs on your ovaries. °What are the signs or symptoms? °At first, you may have no symptoms. You will get symptoms slowly. They may include: °More thirst than normal. °More hunger than normal. °Needing to pee more than normal. °Losing weight without trying. °Feeling tired. °Feeling weak. °Seeing things blurry. °Dark patches on your skin. °How is this treated? °This condition may be treated by a diabetes expert. You may need to: °Follow an eating plan made by a food expert (dietitian). °Get regular exercise. °Find ways to deal with stress. °Check blood sugar as often as told. °Take medicines. °Your doctor will set treatment goals for you. Your blood sugar should be at these levels: °Before meals: 80-130 mg/dL (4.4-7.2 mmol/L). °After meals: below 180 mg/dL (10 mmol/L). °Over the last 2-3 months: less than 7%. °Follow these instructions at home: °Medicines °Take your diabetes medicines or insulin every day. °Take medicines as told to help you prevent other problems caused by this condition. You may need: °Aspirin. °Medicine to lower cholesterol. °Medicine to  control blood pressure. °Questions to ask your doctor °Should I meet with a diabetes educator? °What medicines do I need, and when should I take them? °What will I need to treat my condition at home? °When should I check my blood sugar? °Where can I find a support group? °Who can I call if I have questions? °When is my next doctor visit? °General instructions °Take over-the-counter and prescription medicines only as told by your doctor. °Keep all follow-up visits. °Where to find more information °For help and guidance and more information about diabetes, please go to: °American Diabetes Association (ADA): www.diabetes.org °American Association of Diabetes Care and Education Specialists (ADCES): www.diabeteseducator.org °International Diabetes Federation (IDF): www.idf.org °Contact a doctor if: °Your blood sugar is at or above 240 mg/dL (13.3 mmol/L) for 2 days in a row. °You have been sick for 2 days or more, and you are not getting better. °You have had a fever for 2 days or more, and you are not getting better. °You have any of these problems for more than 6 hours: °You cannot eat or drink. °You feel like you may vomit. °You vomit. °You have watery poop (diarrhea). °Get help right away if: °Your blood sugar is lower than 54 mg/dL (3 mmol/L). °You feel mixed up (confused). °You have trouble thinking clearly. °You have trouble breathing. °You have medium or large ketone levels in your pee. °These symptoms may be an emergency. Get help right away. Call your local emergency services (911 in the [place] °Do not wait to see if the symptoms will go away. °Do not drive yourself   to the hospital. °Summary °Type 2 diabetes is a long-term disease. Your pancreas may not make enough insulin, or your body may not react in a normal way to insulin that it makes. °This condition is treated with an eating plan, lifestyle changes, and medicines. °Your doctor will set treatment goals for you. These will help you keep your blood sugar  in a healthy range. °Keep all follow-up visits. °This information is not intended to replace advice given to you by your health care provider. Make sure you discuss any questions you have with your health care provider. °Document Revised: 11/04/2020 Document Reviewed: 11/04/2020 °Elsevier Patient Education © 2022 Elsevier Inc. ° °

## 2021-09-10 NOTE — Chronic Care Management (AMB) (Signed)
° ° °  Chronic Care Management Pharmacy Assistant   Name: Alec Solis  MRN: [id number] DOB: 11-11-1932  Reason for Encounter: Patient Assistance Coordination    09/10/2021- Received fax from [employer] regarding missing information form application. Fax stated patients request was too soon, government and prive insurance information missing and proof of annual household income insufficient, needs most recent federal tax return. Called patient to inform, patient will attempt to print out out his last 2021 tax returns, he didn't have a hard copy due to completing these online. Patient has an office visit with Efrain Sella, PA and Orlando Penner, CPP today. Will try to get that information with him.  Medications: Outpatient Encounter Medications as of 09/10/2021  Medication Sig   acetaminophen (TYLENOL) 650 MG CR tablet Take 650 mg by mouth every 8 (eight) hours as needed for pain.    allopurinol (ZYLOPRIM) 100 MG tablet TAKE 1 TABLET BY MOUTH TWICE DAILY   amLODipine (NORVASC) 5 MG tablet Take 1 tablet (5 mg total) by mouth daily.   aspirin 81 MG tablet Take 81 mg by mouth daily.    B-D ULTRAFINE III SHORT PEN 31G X 8 MM MISC USE AS DIRECTED FOUR TIMES DAILY   BINAXNOW COVID-19 AG HOME TEST KIT AS DIRECTED PER PACKAGE INSTRUCTIONS   calcitRIOL (ROCALTROL) 0.25 MCG capsule Take 0.25 mcg by mouth every other day.    carboxymethylcellulose (REFRESH PLUS) 0.5 % SOLN Place 1 drop into both eyes 3 (three) times daily as needed (Watery eyes).    carvedilol (COREG) 12.5 MG tablet Take 12.5 mg by mouth 2 (two) times daily with a meal.   clopidogrel (PLAVIX) 75 MG tablet TAKE 1 TABLET(75 MG) BY MOUTH DAILY   ferrous sulfate 325 (65 FE) MG tablet Take 325 mg by mouth daily with breakfast.   furosemide (LASIX) 40 MG tablet Take 40 mg by mouth daily as needed for fluid or edema.    glucose blood (TRUE METRIX BLOOD GLUCOSE TEST) test strip Use as directed to check blood sugars 3 times per day dx: e11.65    insulin degludec (TRESIBA FLEXTOUCH) 200 UNIT/ML FlexTouch Pen Inject 22 Units into the skin daily.   Insulin Pen Needle 32G X 8 MM MISC Use as directed   Multiple Vitamins-Minerals (MULTIVITAMIN ADULT PO) Take 1 tablet by mouth daily. [place] Mega men enhancement (Patient not taking: Reported on 07/29/2021)   Multiple Vitamins-Minerals (PRESERVISION AREDS 2 PO) Take 1 tablet by mouth in the morning and at bedtime. (Patient not taking: Reported on 07/29/2021)   naproxen sodium (ALEVE) 220 MG tablet Take 220 mg by mouth daily as needed (Body ache).   Omega-3 Fatty Acids (FISH OIL OMEGA-3 PO) Take 1,200 mg by mouth daily.    omeprazole (PRILOSEC) 40 MG capsule TAKE 1 CAPSULE BY MOUTH EVERY DAY BEFORE A MEAL   ondansetron (ZOFRAN) 4 MG tablet TAKE 1 TABLET(4 MG) BY MOUTH DAILY AS NEEDED FOR NAUSEA OR VOMITING   rosuvastatin (CRESTOR) 20 MG tablet TAKE 1 TABLET BY MOUTH DAILY ON MONDAY TO FRIDAY   sodium bicarbonate 650 MG tablet Take 1,300 mg by mouth 2 (two) times daily.   TRUEplus Lancets 30G MISC USE AS DIRECTED TO TEST BLOOD SUGAR THREE TIMES DAILY   vitamin B-12 (CYANOCOBALAMIN) 1000 MCG tablet Take 1,000 mcg by mouth daily.   No facility-administered encounter medications on file as of 09/10/2021.  Pattricia Boss, [place] Pharmacist Assistant [phone]

## 2021-09-10 NOTE — Progress Notes (Signed)
I,Alec Solis,acting as a Education administrator for [employer], FNP.,have documented all relevant documentation on the behalf of Alec Brine, FNP,as directed by  Alec Brine, FNP while in the presence of Alec Solis, Alec Solis.  This visit occurred during the SARS-CoV-2 public health emergency.  Safety protocols were in place, including screening questions prior to the visit, additional usage of staff PPE, and extensive cleaning of exam room while observing appropriate contact time as indicated for disinfecting solutions.  Subjective:     Patient ID: Alec Solis , male    DOB: April 11, 1933 , 88 y.o.   MRN: [id number]   Chief Complaint  Patient presents with   Diabetes    HPI  Alec Solis was having a bad day for the last couple weeks, then his washing machine broke down and he has someone to come and check on it. He is down here by himself. He did see his family during Christmas.  His daughter had an operation on her back and walking around with screws. He has taken medication for depression in his 45's.    Diabetes He presents for his follow-up diabetic visit. He has type 2 diabetes mellitus. His disease course has been improving. There are no hypoglycemic associated symptoms. Pertinent negatives for diabetes include no blurred vision. There are no hypoglycemic complications. Symptoms are improving. There are no diabetic complications. Risk factors for coronary artery disease include sedentary lifestyle, male sex, diabetes mellitus and hypertension. Current diabetic treatment includes oral agent (dual therapy). He is compliant with treatment most of the time. His home blood glucose trend is decreasing steadily. (Blood sugars are running lower down to 86, he is currently taking 20 units nightly)    Past Medical History:  Diagnosis Date   Anemia    Anxiety    Arthritis    Cataract    Chronic kidney disease    Depression    Diabetes mellitus without complication (HCC)    type II    GERD  (gastroesophageal reflux disease)    Gout    Heart murmur    Hypertension    Myocardial infarction ([place])    minor Mi- years ago -    Varicose veins of left lower extremity      Family History  Problem Relation Age of Onset   Breast cancer Mother    Stomach cancer Mother    Diabetes Mellitus II Maternal Aunt      Current Outpatient Medications:    acetaminophen (TYLENOL) 650 MG CR tablet, Take 650 mg by mouth every 8 (eight) hours as needed for pain. , Disp: , Rfl:    allopurinol (ZYLOPRIM) 100 MG tablet, TAKE 1 TABLET BY MOUTH TWICE DAILY, Disp: 180 tablet, Rfl: 0   amLODipine (NORVASC) 5 MG tablet, Take 1 tablet (5 mg total) by mouth daily., Disp: 90 tablet, Rfl: 1   aspirin 81 MG tablet, Take 81 mg by mouth daily. , Disp: , Rfl:    B-D ULTRAFINE III SHORT PEN 31G X 8 MM MISC, USE AS DIRECTED FOUR TIMES DAILY, Disp: 100 each, Rfl: 3   BINAXNOW COVID-19 AG HOME TEST KIT, AS DIRECTED PER PACKAGE INSTRUCTIONS, Disp: , Rfl:    calcitRIOL (ROCALTROL) 0.25 MCG capsule, Take 0.25 mcg by mouth every other day. , Disp: , Rfl:    carboxymethylcellulose (REFRESH PLUS) 0.5 % SOLN, Place 1 drop into both eyes 3 (three) times daily as needed (Watery eyes). , Disp: , Rfl:    carvedilol (COREG) 12.5 MG tablet, Take 12.5 mg by mouth  2 (two) times daily with a meal., Disp: , Rfl:    clopidogrel (PLAVIX) 75 MG tablet, TAKE 1 TABLET(75 MG) BY MOUTH DAILY, Disp: 90 tablet, Rfl: 2   ferrous sulfate 325 (65 FE) MG tablet, Take 325 mg by mouth daily with breakfast., Disp: , Rfl:    furosemide (LASIX) 40 MG tablet, Take 40 mg by mouth daily as needed for fluid or edema. , Disp: , Rfl:    glucose blood (TRUE METRIX BLOOD GLUCOSE TEST) test strip, Use as directed to check blood sugars 3 times per day dx: e11.65, Disp: 300 each, Rfl: 5   insulin degludec (TRESIBA FLEXTOUCH) 200 UNIT/ML FlexTouch Pen, Inject 18 Units into the skin daily., Disp: 27 mL, Rfl: 1   Insulin Pen Needle 32G X 8 MM MISC, Use as directed,  Disp: 100 each, Rfl: 2   naproxen sodium (ALEVE) 220 MG tablet, Take 220 mg by mouth daily as needed (Body ache)., Disp: , Rfl:    Omega-3 Fatty Acids (FISH OIL OMEGA-3 PO), Take 1,200 mg by mouth daily. , Disp: , Rfl:    omeprazole (PRILOSEC) 40 MG capsule, TAKE 1 CAPSULE BY MOUTH EVERY DAY BEFORE A MEAL, Disp: 90 capsule, Rfl: 1   ondansetron (ZOFRAN) 4 MG tablet, TAKE 1 TABLET(4 MG) BY MOUTH DAILY AS NEEDED FOR NAUSEA OR VOMITING, Disp: 30 tablet, Rfl: 1   rosuvastatin (CRESTOR) 20 MG tablet, TAKE 1 TABLET BY MOUTH DAILY ON MONDAY TO FRIDAY, Disp: 75 tablet, Rfl: 1   sodium bicarbonate 650 MG tablet, Take 1,300 mg by mouth 2 (two) times daily., Disp: , Rfl:    TRUEplus Lancets 30G MISC, USE AS DIRECTED TO TEST BLOOD SUGAR THREE TIMES DAILY, Disp: 300 each, Rfl: 11   vitamin B-12 (CYANOCOBALAMIN) 1000 MCG tablet, Take 1,000 mcg by mouth daily., Disp: , Rfl:    No Known Allergies   Review of Systems  Constitutional: Negative.   Eyes:  Negative for blurred vision.  Respiratory: Negative.    Cardiovascular: Negative.   Gastrointestinal: Negative.   Neurological: Negative.     Today's Vitals   09/10/21 1540  BP: 120/70  Pulse: 82  Temp: 98.3 F (36.8 C)  TempSrc: Oral  Weight: 184 lb (83.5 kg)  Height: 5' 8.8" (1.748 m)   Body mass index is 27.33 kg/m.  Wt Readings from Last 3 Encounters:  09/10/21 184 lb (83.5 kg)  07/29/21 190 lb 9.6 oz (86.5 kg)  01/30/21 188 lb (85.3 kg)    Objective:  Physical Exam Vitals reviewed.  Constitutional:      General: He is not in acute distress.    Appearance: Normal appearance.  Cardiovascular:     Rate and Rhythm: Normal rate and regular rhythm.     Pulses: Normal pulses.     Heart sounds: Normal heart sounds. No murmur heard. Pulmonary:     Effort: Pulmonary effort is normal. No respiratory distress.     Breath sounds: Normal breath sounds. No wheezing.  Musculoskeletal:     Right lower leg: No edema.     Left lower leg: No  edema.  Skin:    General: Skin is warm and dry.     Capillary Refill: Capillary refill takes less than 2 seconds.  Neurological:     General: No focal deficit present.     Mental Status: He is alert and oriented to person, place, and time.     Cranial Nerves: No cranial nerve deficit.     Motor: No weakness.  Psychiatric:        Mood and Affect: Mood normal.        Behavior: Behavior normal.        Thought Content: Thought content normal.        Judgment: Judgment normal.        Assessment And Plan:     1. Type 2 diabetes mellitus with stage 3b chronic kidney disease, with long-term current use of insulin (HCC) Comments: He is doing better with his diabetes, will have him to decrease to 18 units daily and continue to check his blood sugars regularly.  - insulin degludec (TRESIBA FLEXTOUCH) 200 UNIT/ML FlexTouch Pen; Inject 18 Units into the skin daily.  Dispense: 27 mL; Refill: 1  2. Reactive depression Comments: Offered medication therapy but he declines, he is interested in seeking grief counseling. Denies suicidal or homicidal ideations. He is advised if he changes his mind about medications to call to office. - Ambulatory referral to Psychology  3. Grief Comments: Continues to grieve the loss of his wife who passed in 2021, he reports this has increased in the last few weeks.  - Ambulatory referral to Psychology     Patient was given opportunity to ask questions. Patient verbalized understanding of the plan and was able to repeat key elements of the plan. All questions were answered to their satisfaction.  Alec Brine, FNP   I, Alec Brine, FNP, have reviewed all documentation for this visit. The documentation on 09/10/21 for the exam, diagnosis, procedures, and orders are all accurate and complete.   IF YOU HAVE BEEN REFERRED TO A SPECIALIST, IT MAY TAKE 1-2 WEEKS TO SCHEDULE/PROCESS THE REFERRAL. IF YOU HAVE NOT HEARD FROM US/SPECIALIST IN TWO WEEKS, PLEASE GIVE [place] A  CALL AT [phone].   THE PATIENT IS ENCOURAGED TO PRACTICE SOCIAL DISTANCING DUE TO THE COVID-19 PANDEMIC.

## 2021-09-10 NOTE — Progress Notes (Signed)
No show

## 2021-09-10 NOTE — Patient Instructions (Signed)

## 2021-09-23 ENCOUNTER — Telehealth: Payer: Self-pay

## 2021-09-23 NOTE — Telephone Encounter (Signed)
Spoke with patient and scheduled a Televisit Palliative Consult for 09/29/21 @ 1 PM.   Consent obtained; updated Outlook/Netsmart/Team List and Epic.

## 2021-09-29 ENCOUNTER — Other Ambulatory Visit: Payer: Medicare Other | Admitting: Internal Medicine

## 2021-09-29 ENCOUNTER — Encounter: Payer: Self-pay | Admitting: Internal Medicine

## 2021-09-29 ENCOUNTER — Other Ambulatory Visit: Payer: Self-pay

## 2021-09-29 ENCOUNTER — Encounter (HOSPITAL_COMMUNITY): Payer: Self-pay | Admitting: *Deleted

## 2021-09-29 NOTE — Progress Notes (Deleted)
Due to the COVID-19 crisis, this visit was done via telemedicine from my office and it was initiated and consent by this patient and or family.  I connected with  Alec Solis OR PROXY on 09/29/21 by a  telemedicine application and verified that I am speaking with the correct person using two identifiers.  Pt is unable to connect via a video enabled technique due to lack of availability.   I discussed the limitations of evaluation and management by telemedicine. The patient expressed understanding and agreed to proceed.     [facility] [facility] Palliative Care Consult Note Telephone: [phone]  Fax: [phone]   Date of encounter: 09/29/21 12:24 PM PATIENT NAME: Alec Solis [address] [place] [address]   [phone] (home)  DOB: September 05, 1932 MRN: [id number] PRIMARY CARE PROVIDER:    [name], [place],  [address] [place] [address] [phone]  REFERRING PROVIDER:   [name], [place] [address] [place],  [place] [address] [phone]  RESPONSIBLE PARTY:    Contact Information     Name Relation Home Work Mobile   Solis,Alec Spouse [phone]  [phone]        I met via phone with patient and family at their home. Palliative Care was asked to follow this patient by consultation request of  [name], MD to address advance care planning and complex medical decision making. This is the initial visit.                                     ASSESSMENT AND PLAN / RECOMMENDATIONS:   Advance Care Planning/Goals of Care: Goals include to maximize quality of life and symptom management. Patient/health care surrogate gave his/her permission to discuss.Our advance care planning conversation included a discussion about:    The value and importance of advance care planning  Experiences with loved ones who have been seriously ill or have died  Exploration of personal, cultural or spiritual beliefs that  might influence medical decisions  Exploration of goals of care in the event of a sudden injury or illness  Identification  of a healthcare agent  Review and updating or creation of an  advance directive document . No ACP docs were in vynca prior to visit. Decision not to resuscitate or to de-escalate disease focused treatments due to poor prognosis. CODE STATUS:  Symptom Management/Plan:    Follow up Palliative Care Visit: Palliative care will continue to follow for complex medical decision making, advance care planning, and clarification of goals. Return *** weeks or prn.   This visit was coded based on medical decision making (MDM).***  PPS: ***0%  HOSPICE ELIGIBILITY/DIAGNOSIS: TBD  Chief Complaint:  New palliative care consult  HISTORY OF PRESENT ILLNESS:  Alec Solis is a 88 y.o. year old male  with DMII, CAD s/p MI, varicose veins, gerd, heart murmur, CKD, depression, arthritis, angiodysplasia with hemorrhage in 03/2020, anemia, cataracts, anxiety, gout, hyperlipidemia, b12 deficiency, Paget's disease of the bone, hemorrhoids, among others as below.   Notes indicate he lost his wife in 2021 and was in need of grief counseling.  He was referred to psychology for this.  I see that when we were consulted, grief counseling was actually mentioned as the primary need so I will be sure that he is connected to that dept.  History obtained from review of EMR, discussion with primary team, and interview with family, facility  staff/caregiver and/or Mr. Keo.   I reviewed available labs, medications, imaging, studies and related documents from the EMR.  Records reviewed and summarized above.   ROS  *** General: NAD EYES: denies vision changes ENMT: denies dysphagia Cardiovascular: denies chest pain, denies DOE Pulmonary: denies cough, denies increased SOB Abdomen: endorses good appetite, denies constipation, endorses continence of bowel GU: denies dysuria, endorses continence of  urine MSK:  denies increased weakness,  no falls reported Skin: denies rashes or wounds Neurological: denies pain, denies insomnia Psych: Endorses positive mood Heme/lymph/immuno: denies bruises, abnormal bleeding  Physical Exam: Current and past weights: 184 lbs  with BMI of 27 down from 190 lbs in Dec '22 Constitutional: NAD General: frail appearing, thin/WNWD/obese  EYES: anicteric sclera, lids intact, no discharge  ENMT: intact hearing, oral mucous membranes moist, dentition intact CV: S1S2, RRR, no LE edema Pulmonary: LCTA, no increased work of breathing, no cough, room air Abdomen: intake 100%, normo-active BS + 4 quadrants, soft and non tender, no ascites GU: deferred MSK: no sarcopenia, moves all extremities, ambulatory Skin: warm and dry, no rashes or wounds on visible skin Neuro:  no generalized weakness,  no cognitive impairment Psych: non-anxious affect, A and O x 3 Hem/lymph/immuno: no widespread bruising  CURRENT PROBLEM LIST:  Patient Active Problem List   Diagnosis Date Noted   Abnormal feces 03/20/2021   Abnormal weight loss 03/20/2021   Diarrhea 03/20/2021   Diverticular disease of colon 03/20/2021   Dysphagia 03/20/2021   Epigastric pain 03/20/2021   Hemorrhage of rectum and anus 03/20/2021   Iron deficiency anemia 03/20/2021   Nausea and vomiting 03/20/2021   Black stools 03/06/2020   Gastroesophageal reflux disease without esophagitis 03/06/2020   Paget's disease of bone 10/23/2018   Acute renal failure (ARF) ([facility]) 06/11/2016   Hyperglycemia 06/11/2016   Deficiency anemia 12/09/2011   Diabetes mellitus with stage 3 chronic kidney disease ([facility]) 12/09/2011   Renal insufficiency 12/09/2011   PAST MEDICAL HISTORY:  Active Ambulatory Problems    Diagnosis Date Noted   Deficiency anemia 12/09/2011   Diabetes mellitus with stage 3 chronic kidney disease ([facility]) 12/09/2011   Renal insufficiency 12/09/2011   Acute renal failure (ARF) ([facility]) 06/11/2016    Hyperglycemia 06/11/2016   Paget's disease of bone 10/23/2018   Black stools 03/06/2020   Gastroesophageal reflux disease without esophagitis 03/06/2020   Abnormal feces 03/20/2021   Abnormal weight loss 03/20/2021   Diarrhea 03/20/2021   Diverticular disease of colon 03/20/2021   Dysphagia 03/20/2021   Epigastric pain 03/20/2021   Hemorrhage of rectum and anus 03/20/2021   Iron deficiency anemia 03/20/2021   Nausea and vomiting 03/20/2021   Resolved Ambulatory Problems    Diagnosis Date Noted   No Resolved Ambulatory Problems   Past Medical History:  Diagnosis Date   Anemia    Anxiety    Arthritis    Cataract    Chronic kidney disease    Depression    Diabetes mellitus without complication (HCC)    GERD (gastroesophageal reflux disease)    Gout    Heart murmur    Hypertension    Myocardial infarction ([facility])    Varicose veins of left lower extremity    SOCIAL HX:  Social History   Tobacco Use   Smoking status: Former    Packs/day: 0.50    Years: 50.00    Pack years: 25.00    Types: Cigarettes    Start date: 08/24/1952    Quit date: 08/24/2014  Years since quitting: 7.1   Smokeless tobacco: Never  Substance Use Topics   Alcohol use: Yes    Comment: occassional wine beer   FAMILY HX:  Family History  Problem Relation Age of Onset   Breast cancer Mother    Stomach cancer Mother    Diabetes Mellitus II Maternal Aunt       ALLERGIES: No Known Allergies   PERTINENT MEDICATIONS:  Outpatient Encounter Medications as of 09/29/2021  Medication Sig   acetaminophen (TYLENOL) 650 MG CR tablet Take 650 mg by mouth every 8 (eight) hours as needed for pain.    allopurinol (ZYLOPRIM) 100 MG tablet TAKE 1 TABLET BY MOUTH TWICE DAILY   amLODipine (NORVASC) 5 MG tablet Take 1 tablet (5 mg total) by mouth daily.   aspirin 81 MG tablet Take 81 mg by mouth daily.    B-D ULTRAFINE III SHORT PEN 31G X 8 MM MISC USE AS DIRECTED FOUR TIMES DAILY   BINAXNOW COVID-19 AG HOME TEST  KIT AS DIRECTED PER PACKAGE INSTRUCTIONS   calcitRIOL (ROCALTROL) 0.25 MCG capsule Take 0.25 mcg by mouth every other day.    carboxymethylcellulose (REFRESH PLUS) 0.5 % SOLN Place 1 drop into both eyes 3 (three) times daily as needed (Watery eyes).    carvedilol (COREG) 12.5 MG tablet Take 12.5 mg by mouth 2 (two) times daily with a meal.   clopidogrel (PLAVIX) 75 MG tablet TAKE 1 TABLET(75 MG) BY MOUTH DAILY   ferrous sulfate 325 (65 FE) MG tablet Take 325 mg by mouth daily with breakfast.   furosemide (LASIX) 40 MG tablet Take 40 mg by mouth daily as needed for fluid or edema.    glucose blood (TRUE METRIX BLOOD GLUCOSE TEST) test strip Use as directed to check blood sugars 3 times per day dx: e11.65   insulin degludec (TRESIBA FLEXTOUCH) 200 UNIT/ML FlexTouch Pen Inject 18 Units into the skin daily.   Insulin Pen Needle 32G X 8 MM MISC Use as directed   naproxen sodium (ALEVE) 220 MG tablet Take 220 mg by mouth daily as needed (Body ache).   Omega-3 Fatty Acids (FISH OIL OMEGA-3 PO) Take 1,200 mg by mouth daily.    omeprazole (PRILOSEC) 40 MG capsule TAKE 1 CAPSULE BY MOUTH EVERY DAY BEFORE A MEAL   ondansetron (ZOFRAN) 4 MG tablet TAKE 1 TABLET(4 MG) BY MOUTH DAILY AS NEEDED FOR NAUSEA OR VOMITING   rosuvastatin (CRESTOR) 20 MG tablet TAKE 1 TABLET BY MOUTH DAILY ON MONDAY TO FRIDAY   sodium bicarbonate 650 MG tablet Take 1,300 mg by mouth 2 (two) times daily.   TRUEplus Lancets 30G MISC USE AS DIRECTED TO TEST BLOOD SUGAR THREE TIMES DAILY   vitamin B-12 (CYANOCOBALAMIN) 1000 MCG tablet Take 1,000 mcg by mouth daily.   No facility-administered encounter medications on file as of 09/29/2021.   Thank you for the opportunity to participate in the care of Mr. Cerullo.  The palliative care team will continue to follow. Please call our office at [phone] if we can be of additional assistance.   Candy Ziegler, DO ,   COVID-19 PATIENT SCREENING TOOL Asked and negative response unless  otherwise noted:  Have you had symptoms of covid, tested positive or been in contact with someone with symptoms/positive test in the past 5-10 days? no

## 2021-10-01 NOTE — Progress Notes (Signed)
Pt and family did not answer phone call °

## 2021-10-02 ENCOUNTER — Telehealth: Payer: Self-pay

## 2021-10-02 NOTE — Telephone Encounter (Signed)
Spoke with patient and rescheduled telephonic Palliative Care consult to 2/20 @ 12:30PM.

## 2021-10-03 ENCOUNTER — Other Ambulatory Visit: Payer: Self-pay

## 2021-10-03 ENCOUNTER — Encounter: Payer: Self-pay | Admitting: Podiatry

## 2021-10-03 ENCOUNTER — Ambulatory Visit (INDEPENDENT_AMBULATORY_CARE_PROVIDER_SITE_OTHER): Payer: Medicare Other | Admitting: Podiatry

## 2021-10-03 DIAGNOSIS — M79675 Pain in left toe(s): Secondary | ICD-10-CM | POA: Diagnosis not present

## 2021-10-03 DIAGNOSIS — L84 Corns and callosities: Secondary | ICD-10-CM

## 2021-10-03 DIAGNOSIS — E1122 Type 2 diabetes mellitus with diabetic chronic kidney disease: Secondary | ICD-10-CM | POA: Diagnosis not present

## 2021-10-03 DIAGNOSIS — Q828 Other specified congenital malformations of skin: Secondary | ICD-10-CM | POA: Diagnosis not present

## 2021-10-03 DIAGNOSIS — M79674 Pain in right toe(s): Secondary | ICD-10-CM | POA: Diagnosis not present

## 2021-10-03 DIAGNOSIS — B351 Tinea unguium: Secondary | ICD-10-CM

## 2021-10-03 DIAGNOSIS — N183 Chronic kidney disease, stage 3 unspecified: Secondary | ICD-10-CM | POA: Diagnosis not present

## 2021-10-08 ENCOUNTER — Telehealth: Payer: Self-pay

## 2021-10-08 NOTE — Chronic Care Management (AMB) (Signed)
°  Emeterio Balke was reminded to have all medications, supplements and any blood glucose and blood pressure readings available for review with Orlando Penner, Pharm. D, at his telephone visit on 10-15-2021 at 9:00.   Questions: Have you had any recent office visit or specialist visit outside of [facility]? Patient stated he had blood work this week at the [facility].  Are there any concerns you would like to discuss during your office visit? Patient stated no  Are you having any problems obtaining your medications? (Whether it pharmacy issues or cost) Patient stated no  If patient has any PAP medications ask if they are having any problems getting their PAP medication or refill? Patient stated no  Care Gaps: Shingrix overdue PNA Vac overude Covid booster overdue Yearly ophthalmology overdue AWV 02-26-2022  Star Rating Drug: Rosuvastatin 20 mg- Last filled 09-02-2021  Any gaps in medications fill history? No  [facility] Pharmacist Assistant [phone]

## 2021-10-09 NOTE — Progress Notes (Signed)
Subjective: Alec Solis is a 88 y.o. male patient seen today for follow up of calluses b/l porokeratotic lesions b/l and painful thick toenails that are difficult to trim. Pain interferes with ambulation. Aggravating factors include wearing enclosed shoe gear. Pain is relieved with periodic professional debridement.  He is seen for at risk foot care with h/o NIDDM and CKD.  New problems reported today: None.  Last A1c was 6.2%.  PCP is [name], MD. Last visit was: 01/09/2021.  No Known Allergies  Objective: Physical Exam  General: Patient is a pleasant 88 y.o. African American male WD, WN in NAD. AAO x 3.   Neurovascular Examination: CFT immediate b/l LE. Faintly palpable DP/PT pulses b/l LE. Digital hair absent b/l. Skin temperature gradient WNL b/l. No pain with calf compression b/l. No edema noted b/l. Lower extremity skin temperature gradient within normal limits.  Protective sensation intact 5/5 intact bilaterally with 10g monofilament b/l. Vibratory sensation intact b/l.  Dermatological:  Pedal skin warm and supple b/l.  No open wounds b/l. No interdigital macerations. Toenails 1-5 b/l elongated, thickened, discolored with subungual debris. +Tenderness with dorsal palpation of nailplates. Hyperkeratotic lesion(s) noted submet head 2 left foot.  Toenails 1-5 b/l elongated, discolored, dystrophic, thickened, crumbly with subungual debris and tenderness to dorsal palpation. Porokeratotic lesions plantar hallux IPJ b/l with tenderness to palpation. No erythema, no edema, no drainage, no flocculence.   Musculoskeletal:  Normal muscle strength 5/5 to all lower extremity muscle groups bilaterally. HAV with bunion deformity noted b/l LE. Hammertoe deformity noted 2-5 b/l.  Assessment: 1. Pain due to onychomycosis of toenails of both feet   2. Callus   3. Porokeratosis   4. Diabetes mellitus with stage 3 chronic kidney disease ([facility])    Plan: Patient was evaluated and treated  and all questions answered. Consent given for treatment as described below: -Continue diabetic foot care principles: inspect feet daily, monitor glucose as recommended by PCP and/or Endocrinologist, and follow prescribed diet per PCP, Endocrinologist and/or dietician. -Toenails 1-5 b/l were debrided in length and girth with sterile nail nippers and dremel without iatrogenic bleeding.  -Callus(es) submet head 2 left foot pared utilizing sterile scalpel blade without complication or incident. Total number debrided =1. -Painful porokeratotic lesion(s) bilateral great toes and submet head 3 left foot pared and enucleated with sterile scalpel blade without incident. Total number of lesions debrided=2. -Patient/POA to call should there be question/concern in the interim.  Return in about 3 months (around 12/31/2021).  [name], [name]

## 2021-10-13 ENCOUNTER — Other Ambulatory Visit: Payer: Self-pay

## 2021-10-13 ENCOUNTER — Other Ambulatory Visit: Payer: Medicare Other | Admitting: Internal Medicine

## 2021-10-13 ENCOUNTER — Encounter: Payer: Self-pay | Admitting: Internal Medicine

## 2021-10-13 DIAGNOSIS — Z515 Encounter for palliative care: Secondary | ICD-10-CM | POA: Diagnosis not present

## 2021-10-13 DIAGNOSIS — F4321 Adjustment disorder with depressed mood: Secondary | ICD-10-CM
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
## 2021-10-14 ENCOUNTER — Telehealth: Payer: Self-pay

## 2021-10-14 NOTE — Telephone Encounter (Signed)
PC SW  completed a referral to [facility] for counseling for patient. Patient lost his wife in 2021 and desires counseling regarding his loss/grief.

## 2021-10-15 ENCOUNTER — Telehealth: Payer: Medicare Other

## 2021-10-15 NOTE — Progress Notes (Unsigned)
Current Barriers:  {pharmacybarriers:24917}  Pharmacist Clinical Goal(s):  Patient will {PHARMACYGOALCHOICES:24921} through collaboration with PharmD and provider.   Interventions: 1:1 collaboration with [name], MD regarding development and update of comprehensive plan of care as evidenced by provider attestation and co-signature Inter-disciplinary care team collaboration (see longitudinal plan of care) Comprehensive medication review performed; medication list updated in electronic medical record  {CCM [facility] DISEASE STATES:25130}  Patient Goals/Self-Care Activities Patient will:  - {pharmacypatientgoals:24919}  Follow Up Plan: {CM FOLLOW UP TGGY:69485}  Chronic Care Management Pharmacy Note  10/15/2021 Name:  Kendrick Haapala MRN:  [id number] DOB:  1933-06-03  Summary: ***  Recommendations/Changes made from today's visit: ***  Plan: ***   Subjective: Evie Crumpler is an 88 y.o. year old male who is a primary patient of [name], MD.  The CCM team was consulted for assistance with disease management and care coordination needs.    {CCMTELEPHONEFACETOFACE:21091510} for {CCMINITIALFOLLOWUPCHOICE:21091511} in response to provider referral for pharmacy case management and/or care coordination services.   Consent to Services:  {CCMCONSENTOPTIONS:25074}  Patient Care Team: [name], MD as PCP - General (Internal Medicine) Mayford Knife, [facility] (Pharmacist)  Recent office visits: ***  Recent consult visits: [facility] visits: {Hospital DC Yes/No:25215}   Objective:  Lab Results  Component Value Date   CREATININE 2.07 (H) 07/29/2021   BUN 30 (H) 07/29/2021   EGFR 31 (L) 07/29/2021   GFRNONAA 27 (L) 10/01/2020   GFRAA 31 (L) 10/01/2020   NA 142 07/29/2021   K 5.4 (H) 07/29/2021   CALCIUM 10.0 07/29/2021   CO2 23 07/29/2021   GLUCOSE 117 (H) 07/29/2021    Lab Results  Component Value Date/Time   HGBA1C 6.2 (H) 07/29/2021 09:04 AM    HGBA1C 9.6 05/07/2021 12:00 AM   HGBA1C 7.6 (H) 01/09/2021 10:14 AM   MICROALBUR 30 07/29/2021 09:47 AM   MICROALBUR 80 02/20/2020 11:46 AM    Last diabetic Eye exam:  Lab Results  Component Value Date/Time   HMDIABEYEEXA No Retinopathy 03/22/2020 12:00 AM    Last diabetic Foot exam: No results found for: HMDIABFOOTEX   Lab Results  Component Value Date   CHOL 115 10/24/2020   HDL 26 (L) 10/24/2020   LDLCALC 48 10/24/2020   TRIG 264 (H) 10/24/2020   CHOLHDL 4.4 10/24/2020    Hepatic Function Latest Ref Rng & Units 07/29/2021 05/07/2021 10/01/2020  Total Protein 6.0 - 8.5 g/dL 7.7 - 7.6  Albumin 3.6 - 4.6 g/dL 4.7(H) 4.3 4.3  AST 0 - 40 IU/L 18 - 13  ALT 0 - 44 IU/L 13 - 12  Alk Phosphatase 44 - 121 IU/L 145(H) - 130(H)  Total Bilirubin 0.0 - 1.2 mg/dL 0.3 - <0.2  Bilirubin, Direct 0.00 - 0.40 mg/dL - - -    No results found for: TSH, FREET4  CBC Latest Ref Rng & Units 01/09/2021 05/21/2020 05/07/2020  WBC 3.4 - 10.8 x10E3/uL 5.7 - -  Hemoglobin 13.0 - 17.7 g/dL 9.2(L) 11.0(L) 9.9(L)  Hematocrit 37.5 - 51.0 % 31.5(L) - -  Platelets 150 - 450 x10E3/uL 263 - -    No results found for: VD25OH  Clinical ASCVD: {YES/NO:21197} The ASCVD Risk score (Arnett DK, et al., 2019) failed to calculate for the following reasons:   The 2019 ASCVD risk score is only valid for ages 32 to 4    Depression screen PHQ 2/9 09/10/2021 06/17/2021 01/30/2021  Decreased Interest 3 2 0  Down, Depressed, Hopeless 3 0 0  PHQ - 2  Score 6 2 0  Altered sleeping 3 1 -  Tired, decreased energy 0 1 -  Change in appetite 3 0 -  Feeling bad or failure about yourself  0 0 -  Trouble concentrating 1 1 -  Moving slowly or fidgety/restless 0 0 -  Suicidal thoughts 0 0 -  PHQ-9 Score 13 5 -  Difficult doing work/chores Not difficult at all - -  Some recent data might be hidden     ***Other: (CHADS2VASc if Afib, MMRC or CAT for COPD, ACT, DEXA)  Social History   Tobacco Use  Smoking Status Former    Packs/day: 0.50   Years: 50.00   Pack years: 25.00   Types: Cigarettes   Start date: 08/24/1952   Quit date: 08/24/2014   Years since quitting: 7.1  Smokeless Tobacco Never   BP Readings from Last 3 Encounters:  09/10/21 120/70  07/29/21 114/78  01/09/21 124/78   Pulse Readings from Last 3 Encounters:  09/10/21 82  07/29/21 90  01/09/21 72   Wt Readings from Last 3 Encounters:  09/10/21 184 lb (83.5 kg)  07/29/21 190 lb 9.6 oz (86.5 kg)  01/30/21 188 lb (85.3 kg)   BMI Readings from Last 3 Encounters:  09/10/21 27.33 kg/m  07/29/21 28.31 kg/m  01/30/21 25.50 kg/m    Assessment/Interventions: Review of patient past medical history, allergies, medications, health status, including review of consultants reports, laboratory and other test data, was performed as part of comprehensive evaluation and provision of chronic care management services.   SDOH:  (Social Determinants of Health) assessments and interventions performed: {yes/no:20286}  SDOH Screenings   Alcohol Screen: Not on file  Depression (PHQ2-9): Medium Risk   PHQ-2 Score: 13  Financial Resource Strain: Low Risk    Difficulty of Paying Living Expenses: Not hard at all  Food Insecurity: No Food Insecurity   Worried About Charity fundraiser in the Last Year: Never true   Ran Out of Food in the Last Year: Never true  Housing: Not on file  Physical Activity: Sufficiently Active   Days of Exercise per Week: 6 days   Minutes of Exercise per Session: 30 min  Social Connections: Not on file  Stress: No Stress Concern Present   Feeling of Stress : Not at all  Tobacco Use: Medium Risk   Smoking Tobacco Use: Former   Smokeless Tobacco Use: Never   Passive Exposure: Not on file  Transportation Needs: No Transportation Needs   Lack of Transportation (Medical): No   Lack of Transportation (Non-Medical): No    CCM Care Plan  No Known Allergies  Medications Reviewed Today     Reviewed by Gayland Curry, DO  (Physician) on 10/13/21 at 86  Med List Status: <None>   Medication Order Taking? Sig Documenting Provider Last Dose Status Informant  acetaminophen (TYLENOL) 650 MG CR tablet [id number] No Take 650 mg by mouth every 8 (eight) hours as needed for pain.  [provider] Taking Active Self  allopurinol (ZYLOPRIM) 100 MG tablet [id number]  TAKE 1 TABLET BY MOUTH TWICE DAILY [name], MD  Active   amLODipine (NORVASC) 5 MG tablet [id number] No Take 1 tablet (5 mg total) by mouth daily. [name], MD Taking Active   aspirin 81 MG tablet [id number] No Take 81 mg by mouth daily.  [provider] Taking Active Self  B-D ULTRAFINE III SHORT PEN 31G X 8 MM MISC [id number] No USE AS DIRECTED FOUR TIMES DAILY [name], MD  Taking Active Self  BINAXNOW COVID-19 AG HOME TEST KIT [id number] No AS DIRECTED PER PACKAGE INSTRUCTIONS [provider] Taking Active   calcitRIOL (ROCALTROL) 0.25 MCG capsule [id number] No Take 0.25 mcg by mouth every other day.  [provider] Taking Active Self  carboxymethylcellulose (REFRESH PLUS) 0.5 % SOLN [id number] No Place 1 drop into both eyes 3 (three) times daily as needed (Watery eyes).  [provider] Taking Active Self  carvedilol (COREG) 12.5 MG tablet [id number] No Take 12.5 mg by mouth 2 (two) times daily with a meal. [provider] Taking Active Self  clopidogrel (PLAVIX) 75 MG tablet [id number]  TAKE 1 TABLET(75 MG) BY MOUTH DAILY [name], MD  Active   ferrous sulfate 325 (65 FE) MG tablet [id number] No Take 325 mg by mouth daily with breakfast. [provider] Taking Active Self  furosemide (LASIX) 40 MG tablet [id number] No Take 40 mg by mouth daily as needed for fluid or edema.  [provider] Taking Active Self           Med Note Gentry Roch   Wed Mar 27, 2020 10:27 AM)    glucose blood (TRUE METRIX BLOOD GLUCOSE TEST) test strip [id number] No Use as directed to check blood  sugars 3 times per day dx: e11.65 [name], MD Taking Active   insulin degludec (TRESIBA FLEXTOUCH) 200 UNIT/ML FlexTouch Pen [id number]  Inject 18 Units into the skin daily. [name], FNP  Active   Insulin Pen Needle 32G X 8 MM MISC [id number] No Use as directed [name], MD Taking Active Self  naproxen sodium (ALEVE) 220 MG tablet [id number] No Take 220 mg by mouth daily as needed (Body ache). [provider] Taking Active   Omega-3 Fatty Acids (FISH OIL OMEGA-3 PO) [id number] No Take 1,200 mg by mouth daily.  [provider] Taking Active Self           Med Note Nadara Eaton Mar 27, 2020 10:31 AM)    omeprazole (PRILOSEC) 40 MG capsule [id number] No TAKE 1 CAPSULE BY MOUTH EVERY DAY BEFORE A MEAL [name], MD Taking Active   ondansetron (ZOFRAN) 4 MG tablet [id number]  TAKE 1 TABLET(4 MG) BY MOUTH DAILY AS NEEDED FOR NAUSEA OR VOMITING [name], MD  Active   rosuvastatin (CRESTOR) 20 MG tablet [id number]  TAKE 1 TABLET BY MOUTH DAILY ON MONDAY TO Billy Fischer, MD  Active   sodium bicarbonate 650 MG tablet [id number] No Take 1,300 mg by mouth 2 (two) times daily. [provider] Taking Active Self  TRUEplus Lancets 30G MISC [id number] No USE AS DIRECTED TO TEST BLOOD SUGAR THREE TIMES DAILY [name], MD Taking Active   vitamin B-12 (CYANOCOBALAMIN) 1000 MCG tablet [id number] No Take 1,000 mcg by mouth daily. [provider] Taking Active Self            Patient Active Problem List   Diagnosis Date Noted   Abnormal feces 03/20/2021   Abnormal weight loss 03/20/2021   Diarrhea 03/20/2021   Diverticular disease of colon 03/20/2021   Dysphagia 03/20/2021   Epigastric pain 03/20/2021   Hemorrhage of rectum and anus 03/20/2021   Iron deficiency anemia 03/20/2021   Nausea and vomiting 03/20/2021   Black stools 03/06/2020   Gastroesophageal reflux disease without esophagitis 03/06/2020   Paget's disease of  bone 10/23/2018   Acute renal failure (ARF) ([place]) 06/11/2016   Hyperglycemia 06/11/2016   Deficiency anemia 12/09/2011   Diabetes  mellitus with stage 3 chronic kidney disease ([place]) 12/09/2011   Renal insufficiency 12/09/2011    Immunization History  Administered Date(s) Administered   Influenza, High Dose Seasonal PF 05/25/2018, 04/19/2019   Influenza-Unspecified 05/24/2013, 05/31/2018, 05/07/2021   PFIZER(Purple Top)SARS-COV-2 Vaccination 10/23/2019, 11/15/2019, 09/12/2020, 05/29/2021   Pneumococcal Polysaccharide-23 05/24/2013, 05/24/2017    Conditions to be addressed/monitored:  {USCCMDZASSESSMENTOPTIONS:23563}  There are no care plans that you recently modified to display for this patient.    Medication Assistance: {MEDASSISTANCEINFO:25044}  Compliance/Adherence/Medication fill history: Care Gaps: ***  Star-Rating Drugs: ***  Patient's preferred pharmacy is:  [facility] [place], [address] [place] [address] Phone: [phone] Fax: [phone]  Uses pill box? {Yes or If no, why not?:20788} Pt endorses ***% compliance  We discussed: {Pharmacy options:24294} Patient decided to: {US Pharmacy Plan:23885}  Care Plan and Follow Up Patient Decision:  {FOLLOWUP:24991}  Plan: {CM FOLLOW UP PLAN:25073}  ***

## 2021-10-21 ENCOUNTER — Encounter (HOSPITAL_COMMUNITY): Payer: Self-pay | Admitting: *Deleted

## 2021-10-27 ENCOUNTER — Other Ambulatory Visit: Payer: Self-pay | Admitting: Internal Medicine

## 2021-10-27 LAB — HM DIABETES EYE EXAM

## 2021-10-30 ENCOUNTER — Encounter: Payer: Self-pay | Admitting: Internal Medicine

## 2021-10-30 ENCOUNTER — Ambulatory Visit (INDEPENDENT_AMBULATORY_CARE_PROVIDER_SITE_OTHER): Payer: Medicare Other | Admitting: Internal Medicine

## 2021-10-30 ENCOUNTER — Other Ambulatory Visit: Payer: Self-pay

## 2021-10-30 ENCOUNTER — Encounter (HOSPITAL_COMMUNITY): Payer: Self-pay | Admitting: *Deleted

## 2021-10-30 VITALS — BP 120/78 | HR 77 | Temp 98.3°F | Ht 71.0 in | Wt 184.2 lb

## 2021-10-30 DIAGNOSIS — Z6825 Body mass index (BMI) 25.0-25.9, adult: Secondary | ICD-10-CM | POA: Diagnosis not present

## 2021-10-30 DIAGNOSIS — R0789 Other chest pain: Secondary | ICD-10-CM

## 2021-10-30 DIAGNOSIS — N1832 Chronic kidney disease, stage 3b: Secondary | ICD-10-CM

## 2021-10-30 DIAGNOSIS — Z23 Encounter for immunization: Secondary | ICD-10-CM | POA: Diagnosis not present

## 2021-10-30 DIAGNOSIS — E1122 Type 2 diabetes mellitus with diabetic chronic kidney disease: Secondary | ICD-10-CM

## 2021-10-30 DIAGNOSIS — Z794 Long term (current) use of insulin: Secondary | ICD-10-CM

## 2021-10-30 DIAGNOSIS — I129 Hypertensive chronic kidney disease with stage 1 through stage 4 chronic kidney disease, or unspecified chronic kidney disease: Secondary | ICD-10-CM

## 2021-10-30 NOTE — Patient Instructions (Signed)

## 2021-10-30 NOTE — Progress Notes (Signed)
Rich Brave [place]leton,acting as a Education administrator for Maximino Greenland, MD.,have documented all relevant documentation on the behalf of Maximino Greenland, MD,as directed by  Maximino Greenland, MD while in the presence of Maximino Greenland, MD.  This visit occurred during the SARS-CoV-2 public health emergency.  Safety protocols were in place, including screening questions prior to the visit, additional usage of staff PPE, and extensive cleaning of exam room while observing appropriate contact time as indicated for disinfecting solutions.  Subjective:     Patient ID: Alec Solis , male    DOB: February 28, 1933 , 88 y.o.   MRN: [id number]   Chief Complaint  Patient presents with   Diabetes   Hypertension    HPI  Patient presents today for a f/u on his diabetes and bp. He reports compliance with his meds. He admits to having occasional chest pains. Usually occurs at rest. He is unable to give a description of the pain.   Diabetes He presents for his follow-up diabetic visit. He has type 2 diabetes mellitus. His disease course has been improving. There are no hypoglycemic associated symptoms. Associated symptoms include chest pain. Pertinent negatives for diabetes include no blurred vision. There are no hypoglycemic complications. Symptoms are improving. There are no diabetic complications. Risk factors for coronary artery disease include sedentary lifestyle, male sex, diabetes mellitus and hypertension. Current diabetic treatment includes oral agent (dual therapy). He is compliant with treatment most of the time. His home blood glucose trend is decreasing steadily. (Blood sugars are running lower down to 86, he is currently taking 20 units nightly)  Hypertension Associated symptoms include chest pain. Pertinent negatives include no blurred vision.    Past Medical History:  Diagnosis Date   Anemia    Anxiety    Arthritis    Cataract    Chronic kidney disease    Depression    Diabetes mellitus without  complication (HCC)    type II    GERD (gastroesophageal reflux disease)    Gout    Heart murmur    Hypertension    Myocardial infarction ([place])    minor Mi- years ago -    Varicose veins of left lower extremity      Family History  Problem Relation Age of Onset   Breast cancer Mother    Stomach cancer Mother    Diabetes Mellitus II Maternal Aunt      Current Outpatient Medications:    acetaminophen (TYLENOL) 650 MG CR tablet, Take 650 mg by mouth every 8 (eight) hours as needed for pain. , Disp: , Rfl:    allopurinol (ZYLOPRIM) 100 MG tablet, TAKE 1 TABLET BY MOUTH TWICE DAILY, Disp: 180 tablet, Rfl: 0   amLODipine (NORVASC) 5 MG tablet, Take 1 tablet (5 mg total) by mouth daily., Disp: 90 tablet, Rfl: 1   aspirin 81 MG tablet, Take 81 mg by mouth daily. , Disp: , Rfl:    B-D ULTRAFINE III SHORT PEN 31G X 8 MM MISC, USE AS DIRECTED FOUR TIMES DAILY, Disp: 100 each, Rfl: 3   calcitRIOL (ROCALTROL) 0.25 MCG capsule, Take 0.25 mcg by mouth every other day. , Disp: , Rfl:    carboxymethylcellulose (REFRESH PLUS) 0.5 % SOLN, Place 1 drop into both eyes 3 (three) times daily as needed (Watery eyes). , Disp: , Rfl:    carvedilol (COREG) 12.5 MG tablet, Take 12.5 mg by mouth 2 (two) times daily with a meal., Disp: , Rfl:    clopidogrel (PLAVIX) 75 MG  tablet, TAKE 1 TABLET(75 MG) BY MOUTH DAILY, Disp: 90 tablet, Rfl: 2   ferrous sulfate 325 (65 FE) MG tablet, Take 325 mg by mouth daily with breakfast., Disp: , Rfl:    furosemide (LASIX) 40 MG tablet, Take 40 mg by mouth daily as needed for fluid or edema. , Disp: , Rfl:    glucose blood (TRUE METRIX BLOOD GLUCOSE TEST) test strip, Use as directed to check blood sugars 3 times per day dx: e11.65, Disp: 300 each, Rfl: 5   insulin degludec (TRESIBA FLEXTOUCH) 200 UNIT/ML FlexTouch Pen, Inject 18 Units into the skin daily., Disp: 27 mL, Rfl: 1   Insulin Pen Needle 32G X 8 MM MISC, Use as directed, Disp: 100 each, Rfl: 2   naproxen sodium (ALEVE)  220 MG tablet, Take 220 mg by mouth daily as needed (Body ache)., Disp: , Rfl:    Omega-3 Fatty Acids (FISH OIL OMEGA-3 PO), Take 1,200 mg by mouth daily. , Disp: , Rfl:    omeprazole (PRILOSEC) 40 MG capsule, TAKE 1 CAPSULE BY MOUTH EVERY DAY BEFORE A MEAL, Disp: 90 capsule, Rfl: 1   ondansetron (ZOFRAN) 4 MG tablet, TAKE 1 TABLET(4 MG) BY MOUTH DAILY AS NEEDED FOR NAUSEA OR VOMITING, Disp: 30 tablet, Rfl: 1   rosuvastatin (CRESTOR) 20 MG tablet, TAKE 1 TABLET BY MOUTH DAILY ON MONDAY TO FRIDAY, Disp: 75 tablet, Rfl: 1   sodium bicarbonate 650 MG tablet, Take 1,300 mg by mouth 2 (two) times daily., Disp: , Rfl:    TRUEplus Lancets 30G MISC, USE AS DIRECTED TO TEST BLOOD SUGAR THREE TIMES DAILY, Disp: 300 each, Rfl: 11   vitamin B-12 (CYANOCOBALAMIN) 1000 MCG tablet, Take 1,000 mcg by mouth daily., Disp: , Rfl:    No Known Allergies   Review of Systems  Constitutional: Negative.   Eyes:  Negative for blurred vision.  Respiratory: Negative.    Cardiovascular:  Positive for chest pain.       At end of visit, he mentions having occ mid-chest pain. Denies associated palpitations, diaphoresis, shortness of breath and nausea. Usually occurs at rest. He is not able to determine what triggers his sx.   Gastrointestinal: Negative.   Neurological: Negative.   Psychiatric/Behavioral: Negative.      Today's Vitals   10/30/21 0834  BP: 120/78  Pulse: 77  Temp: 98.3 F (36.8 C)  Weight: 184 lb 3.2 oz (83.6 kg)  Height: '5\' 11"'  (1.803 m)  PainSc: 0-No pain   Body mass index is 25.69 kg/m.  Wt Readings from Last 3 Encounters:  10/30/21 184 lb 3.2 oz (83.6 kg)  09/10/21 184 lb (83.5 kg)  07/29/21 190 lb 9.6 oz (86.5 kg)     Objective:  Physical Exam Vitals and nursing note reviewed.  Constitutional:      Appearance: Normal appearance.  HENT:     Head: Normocephalic and atraumatic.     Nose:     Comments: Masked     Mouth/Throat:     Comments: Masked  Eyes:     Extraocular Movements:  Extraocular movements intact.  Cardiovascular:     Rate and Rhythm: Normal rate and regular rhythm.     Heart sounds: Normal heart sounds.  Pulmonary:     Effort: Pulmonary effort is normal.     Breath sounds: Normal breath sounds.  Musculoskeletal:     Cervical back: Normal range of motion.  Skin:    General: Skin is warm.  Neurological:     General: No focal  deficit present.     Mental Status: He is alert.  Psychiatric:        Mood and Affect: Mood normal.        Assessment And Plan:     1. Type 2 diabetes mellitus with stage 3b chronic kidney disease, with long-term current use of insulin (HCC) Comments: Chronic, I will check labs as below. I will adjust meds as needed. Importance of medication/dietary compliance was d/w patient. He will f/u in 3 months.  - Hemoglobin A1c - Protein electrophoresis, serum - Parathyroid Hormone, Intact w/Ca - CBC no Diff - Lipid panel  2. Hypertensive nephropathy Comments: Chronic, well controlled. No med changes. He is encouraged to follow low sodium diet.  - CMP14+EGFR - Lipid panel  3. Atypical chest pain Comments: Due to cardiac risk factors, I will check EKG. Report is scanned into chart. EKG significant for first degree AV block, frequent PVCs, and LAFB. I will also refer him to Cardiology for further testing/evaluation. He is in agreement with his treatment plan.  - EKG 12-Lead - Ambulatory referral to Cardiology  4. BMI 25.0-25.9,adult Comments: He is encouraged to aim for at least 150 minutes of exercise per week   5. Immunization due Comments: He was given Prevnar-20 IM x 1.  - Pneumococcal conjugate vaccine 20-valent ([id number])   Patient was given opportunity to ask questions. Patient verbalized understanding of the plan and was able to repeat key elements of the plan. All questions were answered to their satisfaction.   I, Maximino Greenland, MD, have reviewed all documentation for this visit. The documentation on 10/30/21  for the exam, diagnosis, procedures, and orders are all accurate and complete.   IF YOU HAVE BEEN REFERRED TO A SPECIALIST, IT MAY TAKE 1-2 WEEKS TO SCHEDULE/PROCESS THE REFERRAL. IF YOU HAVE NOT HEARD FROM US/SPECIALIST IN TWO WEEKS, PLEASE GIVE [place] A CALL AT [phone] X [phone].   THE PATIENT IS ENCOURAGED TO PRACTICE SOCIAL DISTANCING DUE TO THE COVID-19 PANDEMIC.

## 2021-10-31 ENCOUNTER — Telehealth: Payer: Self-pay

## 2021-10-31 NOTE — Chronic Care Management (AMB) (Cosign Needed)
Chronic Care Management Pharmacy Assistant   Name: Alec Solis  MRN: [id number] DOB: 10/06/1932  Reason for Encounter: Disease State/ Diabetes  Recent office visits:  10-30-2021 Alec Chard, MD. Pneumococcal vaccine given.  09-10-2021 Alec Solis, Alec Solis. Referral placed to psychology. DECREASE insulin 22 units daily TO 18 units daily. STOP multivitamin.  07-29-2021 Alec Solis, [name]. Glucose= 117, BUN= 30, Creatinine= 2.07, eGFR= 31, Potassium= 5.4, Chloride= 107, Albumin= 4.7, Alkaline= 145. A1C= 6.2. Shingrix and Pneumococcal  vaccine ordered.  Recent consult visits:  10-13-2021 Alec Kinnier L, DO (Palliative). Follow up visit. No changes.  10-03-2021 Alec Solis, [name] (Podiatry). "Toenails 1-5 b/[facility] were debrided in length and girth with sterile nail nippers and dremel without iatrogenic bleeding.  -Callus(es) submet head 2 left foot pared utilizing sterile scalpel blade without complication or incident. Total number debrided =1. -Painful porokeratotic lesion(s) bilateral great toes and submet head 3 left foot pared and enucleated with sterile scalpel blade without incident. Total number of lesions debrided=2".  Hospital visits:  None in previous 6 months  Medications: Outpatient Encounter Medications as of 10/31/2021  Medication Sig   acetaminophen (TYLENOL) 650 MG CR tablet Take 650 mg by mouth every 8 (eight) hours as needed for pain.    allopurinol (ZYLOPRIM) 100 MG tablet TAKE 1 TABLET BY MOUTH TWICE DAILY   amLODipine (NORVASC) 5 MG tablet Take 1 tablet (5 mg total) by mouth daily.   aspirin 81 MG tablet Take 81 mg by mouth daily.    B-D ULTRAFINE III SHORT PEN 31G X 8 MM MISC USE AS DIRECTED FOUR TIMES DAILY   calcitRIOL (ROCALTROL) 0.25 MCG capsule Take 0.25 mcg by mouth every other day.    carboxymethylcellulose (REFRESH PLUS) 0.5 % SOLN Place 1 drop into both eyes 3 (three) times daily as needed (Watery eyes).    carvedilol (COREG) 12.5 MG tablet Take 12.5  mg by mouth 2 (two) times daily with a meal.   clopidogrel (PLAVIX) 75 MG tablet TAKE 1 TABLET(75 MG) BY MOUTH DAILY   ferrous sulfate 325 (65 FE) MG tablet Take 325 mg by mouth daily with breakfast.   furosemide (LASIX) 40 MG tablet Take 40 mg by mouth daily as needed for fluid or edema.    glucose blood (TRUE METRIX BLOOD GLUCOSE TEST) test strip Use as directed to check blood sugars 3 times per day dx: e11.65   insulin degludec (TRESIBA FLEXTOUCH) 200 UNIT/ML FlexTouch Pen Inject 18 Units into the skin daily.   Insulin Pen Needle 32G X 8 MM MISC Use as directed   naproxen sodium (ALEVE) 220 MG tablet Take 220 mg by mouth daily as needed (Body ache).   Omega-3 Fatty Acids (FISH OIL OMEGA-3 PO) Take 1,200 mg by mouth daily.    omeprazole (PRILOSEC) 40 MG capsule TAKE 1 CAPSULE BY MOUTH EVERY DAY BEFORE A MEAL   ondansetron (ZOFRAN) 4 MG tablet TAKE 1 TABLET(4 MG) BY MOUTH DAILY AS NEEDED FOR NAUSEA OR VOMITING   rosuvastatin (CRESTOR) 20 MG tablet TAKE 1 TABLET BY MOUTH DAILY ON MONDAY TO FRIDAY   sodium bicarbonate 650 MG tablet Take 1,300 mg by mouth 2 (two) times daily.   TRUEplus Lancets 30G MISC USE AS DIRECTED TO TEST BLOOD SUGAR THREE TIMES DAILY   vitamin B-12 (CYANOCOBALAMIN) 1000 MCG tablet Take 1,000 mcg by mouth daily.   No facility-administered encounter medications on file as of 10/31/2021.   Recent Relevant Labs: Lab Results  Component Value Date/Time   HGBA1C 6.2 (  H) 07/29/2021 09:04 AM   HGBA1C 9.6 05/07/2021 12:00 AM   HGBA1C 7.6 (H) 01/09/2021 10:14 AM   MICROALBUR 30 07/29/2021 09:47 AM   MICROALBUR 80 02/20/2020 11:46 AM    Kidney Function Lab Results  Component Value Date/Time   CREATININE 2.07 (H) 07/29/2021 09:04 AM   CREATININE 2.0 (A) 05/07/2021 12:00 AM   CREATININE 2.13 (H) 01/09/2021 10:14 AM   GFRNONAA 27 ([facility]) 10/01/2020 12:43 PM   GFRAA 31 ([facility]) 10/01/2020 12:43 PM    Current antihyperglycemic regimen:  Tresiba 18 units daily  What recent  interventions/DTPs have been made to improve glycemic control:  Educated on Complications of diabetes including kidney damage, retinal damage, and cardiovascular disease; -Counseled to check feet daily and get yearly eye exams -He reports that his A1c was high because he was not eating properly so he came home to change his diet.   Have there been any recent hospitalizations or ED visits since last visit with CPP? No  Patient denies hypoglycemic symptoms  Patient denies hyperglycemic symptoms  How often are you checking your blood sugar? twice daily  What are your blood sugars ranging?  Fasting: 03-07 130, 03-08 135, 03-09 100 Before meals: None After meals: None Bedtime: 03-07 117, 03-08 153, 03-09-128  During the week, how often does your blood glucose drop below 70? Never Are you checking your feet daily/regularly? Daily  Adherence Review: Is the patient currently on a STATIN medication? Yes Is the patient currently on ACE/ARB medication? No Does the patient have >5 day gap between last estimated fill dates? No  Care Gaps: Covid booster ovedue AWV 02-26-2022  Star Rating Drugs: Rosuvastatin 20 mg- Last filled 09-02-2021 90 [facility] [place] Clinical Pharmacist Assistant [phone]

## 2021-11-03 LAB — HEMOGLOBIN A1C
Est. average glucose Bld gHb Est-mCnc: 134 mg/dL
Hgb A1c MFr Bld: 6.3 % — ABNORMAL HIGH (ref 4.8–5.6)

## 2021-11-03 LAB — PROTEIN ELECTROPHORESIS, SERUM
A/G Ratio: 1 (ref 0.7–1.7)
Albumin ELP: 3.9 g/dL (ref 2.9–4.4)
Alpha 1: 0.2 g/dL (ref 0.0–0.4)
Alpha 2: 0.9 g/dL (ref 0.4–1.0)
Beta: 1.1 g/dL (ref 0.7–1.3)
Gamma Globulin: 1.7 g/dL (ref 0.4–1.8)
Globulin, Total: 3.9 g/dL (ref 2.2–3.9)

## 2021-11-03 LAB — CMP14+EGFR
ALT: 9 IU/L (ref 0–44)
AST: 16 IU/L (ref 0–40)
Albumin/Globulin Ratio: 1.3 (ref 1.2–2.2)
Albumin: 4.4 g/dL (ref 3.6–4.6)
Alkaline Phosphatase: 142 IU/L — ABNORMAL HIGH (ref 44–121)
BUN/Creatinine Ratio: 11 (ref 10–24)
BUN: 22 mg/dL (ref 8–27)
Bilirubin Total: 0.3 mg/dL (ref 0.0–1.2)
CO2: 22 mmol/L (ref 20–29)
Calcium: 9.8 mg/dL (ref 8.6–10.2)
Chloride: 105 mmol/L (ref 96–106)
Creatinine, Ser: 2.03 mg/dL — ABNORMAL HIGH (ref 0.76–1.27)
Globulin, Total: 3.4 g/dL (ref 1.5–4.5)
Glucose: 115 mg/dL — ABNORMAL HIGH (ref 70–99)
Potassium: 4.5 mmol/L (ref 3.5–5.2)
Sodium: 144 mmol/L (ref 134–144)
Total Protein: 7.8 g/dL (ref 6.0–8.5)
eGFR: 31 mL/min/{1.73_m2} — ABNORMAL LOW (ref >59)

## 2021-11-03 LAB — LIPID PANEL
Chol/HDL Ratio: 4.3 ratio (ref 0.0–5.0)
Cholesterol, Total: 117 mg/dL (ref 100–199)
HDL: 27 mg/dL — ABNORMAL LOW (ref >39)
LDL Chol Calc (NIH): 61 mg/dL (ref 0–99)
Triglycerides: 169 mg/dL — ABNORMAL HIGH (ref 0–149)
VLDL Cholesterol Cal: 29 mg/dL (ref 5–40)

## 2021-11-03 LAB — CBC
Hematocrit: 31.1 % — ABNORMAL LOW (ref 37.5–51.0)
Hemoglobin: 9.2 g/dL — ABNORMAL LOW (ref 13.0–17.7)
MCH: 22 pg — ABNORMAL LOW (ref 26.6–33.0)
MCHC: 29.6 g/dL — ABNORMAL LOW (ref 31.5–35.7)
MCV: 74 fL — ABNORMAL LOW (ref 79–97)
NRBC: 4 % — ABNORMAL HIGH (ref 0–0)
Platelets: 204 10*3/uL (ref 150–450)
RBC: 4.19 x10E6/uL (ref 4.14–5.80)
RDW: 19.8 % — ABNORMAL HIGH (ref 11.6–15.4)
WBC: 6.1 10*3/uL (ref 3.4–10.8)

## 2021-11-03 LAB — PTH, INTACT AND CALCIUM: PTH: 98 pg/mL — ABNORMAL HIGH (ref 15–65)

## 2021-11-13 DIAGNOSIS — N2581 Secondary hyperparathyroidism of renal origin: Secondary | ICD-10-CM | POA: Diagnosis not present

## 2021-11-13 DIAGNOSIS — D631 Anemia in chronic kidney disease: Secondary | ICD-10-CM | POA: Diagnosis not present

## 2021-11-13 DIAGNOSIS — I129 Hypertensive chronic kidney disease with stage 1 through stage 4 chronic kidney disease, or unspecified chronic kidney disease: Secondary | ICD-10-CM | POA: Diagnosis not present

## 2021-11-13 DIAGNOSIS — M109 Gout, unspecified: Secondary | ICD-10-CM | POA: Diagnosis not present

## 2021-11-13 DIAGNOSIS — N183 Chronic kidney disease, stage 3 unspecified: Secondary | ICD-10-CM | POA: Diagnosis not present

## 2021-11-13 DIAGNOSIS — E1122 Type 2 diabetes mellitus with diabetic chronic kidney disease: Secondary | ICD-10-CM | POA: Diagnosis not present

## 2021-11-21 ENCOUNTER — Encounter (HOSPITAL_COMMUNITY): Payer: Self-pay | Admitting: *Deleted

## 2021-11-21 ENCOUNTER — Encounter: Payer: Self-pay | Admitting: Cardiology

## 2021-11-21 ENCOUNTER — Ambulatory Visit: Payer: Medicare Other | Admitting: Cardiology

## 2021-11-21 VITALS — BP 140/83 | HR 90 | Temp 98.0°F | Resp 16 | Ht 71.0 in | Wt 183.2 lb

## 2021-11-21 DIAGNOSIS — I493 Ventricular premature depolarization: Secondary | ICD-10-CM | POA: Diagnosis not present

## 2021-11-21 DIAGNOSIS — R072 Precordial pain: Secondary | ICD-10-CM

## 2021-11-21 DIAGNOSIS — N1832 Chronic kidney disease, stage 3b: Secondary | ICD-10-CM | POA: Diagnosis not present

## 2021-11-21 DIAGNOSIS — E1122 Type 2 diabetes mellitus with diabetic chronic kidney disease: Secondary | ICD-10-CM

## 2021-11-21 DIAGNOSIS — Z794 Long term (current) use of insulin: Secondary | ICD-10-CM | POA: Diagnosis not present

## 2021-11-21 NOTE — Progress Notes (Signed)
? ?Primary Physician/Referring:  [name], MD ? ?Patient ID: Alec Solis, male    DOB: [birth year], 88 y.o.   MRN: [id number] ? ?Chief Complaint  ?Patient presents with  ? Atypical Chest Pain  ? New Patient (Initial Visit)  ?  Referred by Dr. [name]  ? ?HPI:   ? ?Alec Solis  is a 88 y.o. African-American male patient who is very active, I last seen him almost a decade ago in 2014, hypertension, hyperlipidemia, controlled diabetes mellitus, 60-pack-year history of smoking referred to me by Dr. Baird Cancer for evaluation of chest discomfort and abnormal EKG revealing frequent PVCs ? ?He reports occasionally palpitation and stabbing chest pain for seconds. Chest pain noted after he work-out and varies from different ribs. Otherwise he is well being.  ? ?Past Medical History:  ?Diagnosis Date  ? Anemia   ? Anxiety   ? Arthritis   ? Cataract   ? Chronic kidney disease   ? Depression   ? Diabetes mellitus without complication ([place])   ? type II   ? GERD (gastroesophageal reflux disease)   ? Gout   ? Heart murmur   ? Hypertension   ? Myocardial infarction [facility])   ? minor Mi- years ago -   ? Varicose veins of left lower extremity   ? ?Past Surgical History:  ?Procedure Laterality Date  ? BUNIONECTOMY    ? cataract    ? COLONOSCOPY WITH PROPOFOL N/A 05/28/2017  ? Procedure: COLONOSCOPY WITH PROPOFOL;  Surgeon: Carol Ada, MD;  Location: [facility] ENDOSCOPY;  Service: Endoscopy;  Laterality: N/A;  ? COLONOSCOPY WITH PROPOFOL N/A 03/29/2020  ? Procedure: COLONOSCOPY WITH PROPOFOL;  Surgeon: Carol Ada, MD;  Location: [facility];  Service: Endoscopy;  Laterality: N/A;  ? ENTEROSCOPY N/A 03/29/2020  ? Procedure: ENTEROSCOPY;  Surgeon: Carol Ada, MD;  Location: [facility];  Service: Endoscopy;  Laterality: N/A;  ? ESOPHAGOGASTRODUODENOSCOPY (EGD) WITH PROPOFOL N/A 05/28/2017  ? Procedure: ESOPHAGOGASTRODUODENOSCOPY (EGD) WITH PROPOFOL;  Surgeon: Carol Ada, MD;  Location: [facility]OS[facility];  Service: Endoscopy;   Laterality: N/A;  ? HEMOSTASIS CLIP PLACEMENT  03/29/2020  ? Procedure: HEMOSTASIS CLIP PLACEMENT;  Surgeon: Carol Ada, MD;  Location: [facility] END[facility];  Service: Endoscopy;;  ? HOT HEMOSTASIS N/A 03/29/2020  ? Procedure: HOT HEMOSTASIS (ARGON PLASMA COAGULATION/BICAP);  Surgeon: Carol Ada, MD;  Location: [facility];  Service: Endoscopy;  Laterality: N/A;  ? POLYPECTOMY  03/29/2020  ? Procedure: POLYPECTOMY;  Surgeon: Carol Ada, MD;  Location: [facility];  Service: Endoscopy;;  ? ?Family History  ?Problem Relation Age of Onset  ? Breast cancer Mother   ? Stomach cancer Mother   ? Diabetes Mellitus II Maternal Aunt   ?  ?Social History  ? ?Tobacco Use  ? Smoking status: Former  ?  Packs/day: 0.50  ?  Years: 50.00  ?  Pack years: 25.00  ?  Types: Cigarettes  ?  Start date: 08/24/1952  ?  Quit date: 08/24/2014  ?  Years since quitting: 7.2  ? Smokeless tobacco: Never  ?Substance Use Topics  ? Alcohol use: Yes  ?  Comment: occassional wine beer  ? ?Marital Status: Married  ?ROS  ?Review of Systems  ?Cardiovascular:  Positive for chest pain (stabbing chest pain lasting for seconds) and palpitations.  ?Objective  ?Blood pressure 140/83, pulse 90, temperature 98 ?F (36.7 ?C), temperature source Temporal, resp. rate 16, height '5\' 11"'$  (1.803 m), weight 183 lb 3.2 oz (83.1 kg), SpO2 99 %. Body mass index is 25.55 kg/m?Marland Kitchen  ? ?  11/21/2021  ?  9:41 AM 10/30/2021  ?  8:34 AM 09/10/2021  ?  3:40 PM  ?Vitals with BMI  ?Height '5\' 11"'$  '5\' 11"'$  5' 8.8"  ?Weight 183 lbs 3 oz 184 lbs 3 oz 184 lbs  ?BMI 25.56 25.7 27.32  ?Systolic 570 177 939  ?Diastolic 83 78 70  ?Pulse 90 77 82  ?  ?Physical Exam ?Neck:  ?   Vascular: No JVD.  ?Cardiovascular:  ?   Rate and Rhythm: Normal rate and regular rhythm.  ?   Pulses: Intact distal pulses.  ?   Heart sounds: Normal heart sounds. No murmur heard. ?  No gallop.  ?Pulmonary:  ?   Effort: Pulmonary effort is normal.  ?   Breath sounds: Normal breath sounds.  ?Abdominal:  ?   General: Bowel sounds are  normal.  ?   Palpations: Abdomen is soft.  ?Musculoskeletal:  ?   Right lower leg: No edema.  ?   Left lower leg: No edema.  ?  ? ?Medications and allergies  ?No Known Allergies  ? ?Medication list after today's encounter  ? ?Current Outpatient Medications:  ?  acetaminophen (TYLENOL) 650 MG CR tablet, Take 650 mg by mouth every 8 (eight) hours as needed for pain. , Disp: , Rfl:  ?  allopurinol (ZYLOPRIM) 100 MG tablet, TAKE 1 TABLET BY MOUTH TWICE DAILY, Disp: 180 tablet, Rfl: 0 ?  amLODipine (NORVASC) 5 MG tablet, Take 1 tablet (5 mg total) by mouth daily., Disp: 90 tablet, Rfl: 1 ?  aspirin 81 MG tablet, Take 81 mg by mouth daily. , Disp: , Rfl:  ?  B-D ULTRAFINE III SHORT PEN 31G X 8 MM MISC, USE AS DIRECTED FOUR TIMES DAILY, Disp: 100 each, Rfl: 3 ?  calcitRIOL (ROCALTROL) 0.25 MCG capsule, Take 0.25 mcg by mouth every other day. , Disp: , Rfl:  ?  carboxymethylcellulose (REFRESH PLUS) 0.5 % SOLN, Place 1 drop into both eyes 3 (three) times daily as needed (Watery eyes). , Disp: , Rfl:  ?  carvedilol (COREG) 12.5 MG tablet, Take 12.5 mg by mouth 2 (two) times daily with a meal., Disp: , Rfl:  ?  ferrous sulfate 325 (65 FE) MG tablet, Take 325 mg by mouth daily with breakfast., Disp: , Rfl:  ?  furosemide (LASIX) 40 MG tablet, Take 40 mg by mouth daily as needed for fluid or edema. , Disp: , Rfl:  ?  glucose blood (TRUE METRIX BLOOD GLUCOSE TEST) test strip, Use as directed to check blood sugars 3 times per day dx: e11.65, Disp: 300 each, Rfl: 5 ?  insulin degludec (TRESIBA FLEXTOUCH) 200 UNIT/ML FlexTouch Pen, Inject 18 Units into the skin daily., Disp: 27 mL, Rfl: 1 ?  Insulin Pen Needle 32G X 8 MM MISC, Use as directed, Disp: 100 each, Rfl: 2 ?  naproxen sodium (ALEVE) 220 MG tablet, Take 220 mg by mouth daily as needed (Body ache)., Disp: , Rfl:  ?  Omega-3 Fatty Acids (FISH OIL OMEGA-3 PO), Take 1,200 mg by mouth daily. , Disp: , Rfl:  ?  omeprazole (PRILOSEC) 40 MG capsule, TAKE 1 CAPSULE BY MOUTH EVERY  DAY BEFORE A MEAL, Disp: 90 capsule, Rfl: 1 ?  ondansetron (ZOFRAN) 4 MG tablet, TAKE 1 TABLET(4 MG) BY MOUTH DAILY AS NEEDED FOR NAUSEA OR VOMITING, Disp: 30 tablet, Rfl: 1 ?  rosuvastatin (CRESTOR) 20 MG tablet, TAKE 1 TABLET BY MOUTH DAILY ON MONDAY TO FRIDAY, Disp: 75 tablet, Rfl: 1 ?  sodium bicarbonate 650 MG tablet, Take 1,300 mg by mouth 2 (two) times daily., Disp: , Rfl:  ?  TRUEplus Lancets 30G MISC, USE AS DIRECTED TO TEST BLOOD SUGAR THREE TIMES DAILY, Disp: 300 each, Rfl: 11 ?  vitamin B-12 (CYANOCOBALAMIN) 1000 MCG tablet, Take 1,000 mcg by mouth daily., Disp: , Rfl:  ? ?Laboratory examination:  ? ?Recent Labs  ?  01/09/21 ?1014 05/07/21 ?0000 07/29/21 ?5750 10/30/21 ?5183  ?NA 142 142 142 144  ?K 4.8 4.8 5.4* 4.5  ?CL 107* 105 107* 105  ?CO2 21 26* 23 22  ?GLUCOSE 115*  --  117* 115*  ?BUN 32* 30* 30* 22  ?CREATININE 2.13* 2.0* 2.07* 2.03*  ?CALCIUM 10.0 9.9 10.0 9.8  ? ?CrCl cannot be calculated (Patient's most recent lab result is older than the maximum 21 days allowed.).  ? ?  Latest Ref Rng & Units 10/30/2021  ?  9:16 AM 07/29/2021  ?  9:04 AM 05/07/2021  ? 12:00 AM  ?CMP  ?Glucose 70 - 99 mg/dL 115   117     ?BUN 8 - 27 mg/dL '22   30   30    '$ ?Creatinine 0.76 - 1.27 mg/dL 2.03   2.07   2.0    ?Sodium 134 - 144 mmol/L 144   142   142    ?Potassium 3.5 - 5.2 mmol/L 4.5   5.4   4.8    ?Chloride 96 - 106 mmol/L 105   107   105    ?CO2 20 - 29 mmol/L '22   23   26    '$ ?Calcium 8.6 - 10.2 mg/dL 9.8   10.0   9.9    ?Total Protein 6.0 - 8.5 g/dL 7.8   7.7     ?Total Bilirubin 0.0 - 1.2 mg/dL 0.3   0.3     ?Alkaline Phos 44 - 121 IU/L 142   145     ?AST 0 - 40 IU/L 16   18     ?ALT 0 - 44 IU/L 9   13     ? ? ?  Latest Ref Rng & Units 10/30/2021  ?  9:16 AM 01/09/2021  ? 10:14 AM 05/21/2020  ? 10:38 AM  ?CBC  ?WBC 3.4 - 10.8 x10E3/uL 6.1   5.7     ?Hemoglobin 13.0 - 17.7 g/dL 9.2   9.2   11.0    ?Hematocrit 37.5 - 51.0 % 31.1   31.5     ?Platelets 150 - 450 x10E3/uL 204   263     ? ? ?Lipid Panel ?Recent Labs  ?   10/30/21 ?0916  ?CHOL 117  ?TRIG 169*  ?Ila 61  ?HDL 27*  ?CHOLHDL 4.3  ? ?HEMOGLOBIN A1C ?Lab Results  ?Component Value Date  ? HGBA1C 6.3 (H) 10/30/2021  ? MPG 349 06/12/2016  ? ?TSH ?No results for input(s):

## 2021-11-26 ENCOUNTER — Encounter (HOSPITAL_COMMUNITY): Payer: Self-pay | Admitting: *Deleted

## 2021-12-01 ENCOUNTER — Telehealth: Payer: Self-pay

## 2021-12-01 NOTE — Chronic Care Management (AMB) (Signed)
? ? ?  Alec Solis was reminded to have all medications, supplements and any blood glucose and blood pressure readings available for review with Orlando Penner, Pharm. D, at his telephone visit on 12-03-2021 at 12:00. ? ? ? ?Jeannette How CMA ?Clinical Pharmacist Assistant ?[phone] ? ? ?

## 2021-12-03 ENCOUNTER — Ambulatory Visit (INDEPENDENT_AMBULATORY_CARE_PROVIDER_SITE_OTHER): Payer: Medicare Other

## 2021-12-03 ENCOUNTER — Encounter: Payer: Self-pay | Admitting: Internal Medicine

## 2021-12-03 DIAGNOSIS — N1832 Chronic kidney disease, stage 3b: Secondary | ICD-10-CM

## 2021-12-03 DIAGNOSIS — I129 Hypertensive chronic kidney disease with stage 1 through stage 4 chronic kidney disease, or unspecified chronic kidney disease: Secondary | ICD-10-CM

## 2021-12-03 NOTE — Progress Notes (Signed)
? ?Chronic Care Management ?Pharmacy Note ? ?12/09/2021 ?Name:  Rayson Rando MRN:  [id number] DOB:  May 11, 1933 ? ?Summary: ?Patient reports he was in [place], and came back yesterday.  ? ?Recommendations/Changes made from today's visit: ?Recommend patient continue to monitor BS/BP readings at home ?Recommend patient receive Shringrix vaccine ? ?Plan: ?Collaborate with PCP team to schedule patient for shingrix vaccine ? ? ?Subjective: ?Geroge Gilliam is an 88 y.o. year old male who is a primary patient of [name], MD.  The CCM team was consulted for assistance with disease management and care coordination needs.   ? ?Engaged with patient by telephone for follow up visit in response to provider referral for pharmacy case management and/or care coordination services.  ? ?Consent to Services:  ?The patient was given information about Chronic Care Management services, agreed to services, and gave verbal consent prior to initiation of services.  Please see initial visit note for detailed documentation.  ? ?Patient Care Team: ?[name], MD as PCP - General (Internal Medicine) ?Mayford Knife, RPH (Pharmacist) ? ?Recent office visits: ?10/30/2021 PCP OV ? ?Recent consult visits: ?11/21/2021 -Cardiology OV ? ?Hospital visits: ?None in previous 6 months ? ? ?Objective: ? ?Lab Results  ?Component Value Date  ? CREATININE 2.03 (H) 10/30/2021  ? BUN 22 10/30/2021  ? EGFR 31 (L) 10/30/2021  ? GFRNONAA 27 (L) 10/01/2020  ? GFRAA 31 (L) 10/01/2020  ? NA 144 10/30/2021  ? K 4.5 10/30/2021  ? CALCIUM 9.8 10/30/2021  ? CO2 22 10/30/2021  ? GLUCOSE 115 (H) 10/30/2021  ? ? ?Lab Results  ?Component Value Date/Time  ? HGBA1C 6.3 (H) 10/30/2021 09:16 AM  ? HGBA1C 6.2 (H) 07/29/2021 09:04 AM  ? HGBA1C 9.6 05/07/2021 12:00 AM  ? MICROALBUR 30 07/29/2021 09:47 AM  ? MICROALBUR 80 02/20/2020 11:46 AM  ?  ?Last diabetic Eye exam:  ?Lab Results  ?Component Value Date/Time  ? HMDIABEYEEXA No Retinopathy 03/22/2020 12:00 AM  ?  ?Last  diabetic Foot exam: No results found for: HMDIABFOOTEX  ? ?Lab Results  ?Component Value Date  ? CHOL 117 10/30/2021  ? HDL 27 (L) 10/30/2021  ? [place] 61 10/30/2021  ? TRIG 169 (H) 10/30/2021  ? CHOLHDL 4.3 10/30/2021  ? ? ? ?  Latest Ref Rng & Units 10/30/2021  ?  9:16 AM 07/29/2021  ?  9:04 AM 05/07/2021  ? 12:00 AM  ?Hepatic Function  ?Total Protein 6.0 - 8.5 g/dL 7.8   7.7     ?Albumin 3.6 - 4.6 g/dL 4.4   4.7   4.3    ?AST 0 - 40 IU/L 16   18     ?ALT 0 - 44 IU/L 9   13     ?Alk Phosphatase 44 - 121 IU/L 142   145     ?Total Bilirubin 0.0 - 1.2 mg/dL 0.3   0.3     ? ? ?No results found for: TSH, FREET4 ? ? ?  Latest Ref Rng & Units 10/30/2021  ?  9:16 AM 01/09/2021  ? 10:14 AM 05/21/2020  ? 10:38 AM  ?CBC  ?WBC 3.4 - 10.8 x10E3/uL 6.1   5.7     ?Hemoglobin 13.0 - 17.7 g/dL 9.2   9.2   11.0    ?Hematocrit 37.5 - 51.0 % 31.1   31.5     ?Platelets 150 - 450 x10E3/uL 204   263     ? ? ?No results found for: VD25OH ? ?Clinical ASCVD: No  ?  The ASCVD Risk score (Arnett DK, et al., 2019) failed to calculate for the following reasons: ?  The 2019 ASCVD risk score is only valid for ages 20 to 66   ? ? ?  10/30/2021  ?  8:57 AM 09/10/2021  ?  3:38 PM 06/17/2021  ? 11:39 AM  ?Depression screen PHQ 2/9  ?Decreased Interest 0 3 2  ?Down, Depressed, Hopeless 0 3 0  ?PHQ - 2 Score 0 6 2  ?Altered sleeping  3 1  ?Tired, decreased energy  0 1  ?Change in appetite  3 0  ?Feeling bad or failure about yourself   0 0  ?Trouble concentrating  1 1  ?Moving slowly or fidgety/restless  0 0  ?Suicidal thoughts  0 0  ?PHQ-9 Score  13 5  ?Difficult doing work/chores  Not difficult at all   ?  ? ? ?Social History  ? ?Tobacco Use  ?Smoking Status Former  ? Packs/day: 0.50  ? Years: 50.00  ? Pack years: 25.00  ? Types: Cigarettes  ? Start date: 08/24/1952  ? Quit date: 08/24/2014  ? Years since quitting: 7.2  ?Smokeless Tobacco Never  ? ?BP Readings from Last 3 Encounters:  ?11/21/21 140/83  ?10/30/21 120/78  ?09/10/21 120/70  ? ?Pulse Readings from Last  3 Encounters:  ?11/21/21 90  ?10/30/21 77  ?09/10/21 82  ? ?Wt Readings from Last 3 Encounters:  ?11/21/21 183 lb 3.2 oz (83.1 kg)  ?10/30/21 184 lb 3.2 oz (83.6 kg)  ?09/10/21 184 lb (83.5 kg)  ? ?BMI Readings from Last 3 Encounters:  ?11/21/21 25.55 kg/m?  ?10/30/21 25.69 kg/m?  ?09/10/21 27.33 kg/m?  ? ? ?Assessment/Interventions: Review of patient past medical history, allergies, medications, health status, including review of consultants reports, laboratory and other test data, was performed as part of comprehensive evaluation and provision of chronic care management services.  ? ?SDOH:  (Social Determinants of Health) assessments and interventions performed: No ? ?SDOH Screenings  ? ?Alcohol Screen: Not on file  ?Depression (PHQ2-9): Low Risk   ? PHQ-2 Score: 0  ?Financial Resource Strain: Low Risk   ? Difficulty of Paying Living Expenses: Not hard at all  ?Food Insecurity: No Food Insecurity  ? Worried About Charity fundraiser in the Last Year: Never true  ? Ran Out of Food in the Last Year: Never true  ?Housing: Not on file  ?Physical Activity: Sufficiently Active  ? Days of Exercise per Week: 6 days  ? Minutes of Exercise per Session: 30 min  ?Social Connections: Not on file  ?Stress: No Stress Concern Present  ? Feeling of Stress : Not at all  ?Tobacco Use: Medium Risk  ? Smoking Tobacco Use: Former  ? Smokeless Tobacco Use: Never  ? Passive Exposure: Not on file  ?Transportation Needs: No Transportation Needs  ? Lack of Transportation (Medical): No  ? Lack of Transportation (Non-Medical): No  ? ? ?CCM Care Plan ? ?No Known Allergies ? ?Medications Reviewed Today   ? ? Reviewed by Mayford Knife, RPH (Pharmacist) on 12/03/21 at 1417  Med List Status: <None>  ? ?Medication Order Taking? Sig Documenting Provider Last Dose Status Informant  ?acetaminophen (TYLENOL) 650 MG CR tablet [id number] No Take 650 mg by mouth every 8 (eight) hours as needed for pain.  [provider] Taking Active Self   ?allopurinol (ZYLOPRIM) 100 MG tablet [id number] No TAKE 1 TABLET BY MOUTH TWICE DAILY [name], MD Taking Active   ?amLODipine (NORVASC) 5  MG tablet [id number] No Take 1 tablet (5 mg total) by mouth daily. [name], MD Taking Active   ?aspirin 81 MG tablet [id number] No Take 81 mg by mouth daily.  [provider] Taking Active Self  ?B-D ULTRAFINE III SHORT PEN 31G X 8 MM MISC [id number] No USE AS DIRECTED FOUR TIMES DAILY [name], MD Taking Active Self  ?calcitRIOL (ROCALTROL) 0.25 MCG capsule [id number] No Take 0.25 mcg by mouth every other day.  [provider] Taking Active Self  ?carboxymethylcellulose (REFRESH PLUS) 0.5 % SOLN [id number] No Place 1 drop into both eyes 3 (three) times daily as needed (Watery eyes).  [provider] Taking Active Self  ?carvedilol (COREG) 12.5 MG tablet [id number] No Take 12.5 mg by mouth 2 (two) times daily with a meal. [provider] Taking Active Self  ?ferrous sulfate 325 (65 FE) MG tablet [id number] No Take 325 mg by mouth daily with breakfast. [provider] Taking Active Self  ?furosemide (LASIX) 40 MG tablet [id number] No Take 40 mg by mouth daily as needed for fluid or edema.  [provider] Taking Active Self  ?         ?Med Note Nadara Eaton Mar 27, 2020 10:27 AM)    ?glucose blood (TRUE METRIX BLOOD GLUCOSE TEST) test strip [id number] No Use as directed to check blood sugars 3 times per day dx: e11.65 [name], MD Taking Active   ?insulin degludec (TRESIBA FLEXTOUCH) 200 UNIT/ML FlexTouch Pen [id number] No Inject 18 Units into the skin daily. [name], FNP Taking Active   ?Insulin Pen Needle 32G X 8 MM MISC [id number] No Use as directed [name], MD Taking Active Self  ?naproxen sodium (ALEVE) 220 MG tablet [id number] No Take 220 mg by mouth daily as needed (Body ache). [provider] Taking Active   ?Omega-3 Fatty Acids (FISH OIL OMEGA-3 PO) [id number] No Take 1,200  mg by mouth daily.  [provider] Taking Active Self  ?         ?Med Note Nadara Eaton Mar 27, 2020 10:31 AM)    ?omeprazole (PRILOSEC) 40 MG capsule [id number] No TAKE 1 CAPSULE BY MOUTH E

## 2021-12-09 ENCOUNTER — Other Ambulatory Visit: Payer: Self-pay | Admitting: Internal Medicine

## 2021-12-09 NOTE — Patient Instructions (Signed)
Visit Information ?It was great speaking with you today!  Please let me know if you have any questions about our visit. ? ? Goals Addressed   ? ?  ?  ?  ?  ? This Visit's Progress  ?  Manage My Medicine     ?  Timeframe:  Long-Range Goal ?Priority:  High ?Start Date:                             ?Expected End Date:                      ? ?Follow Up Date : 06/19/2022  ?  ?In Progress:  ?- call for medicine refill 2 or 3 days before it runs out ?- call if I am sick and can't take my medicine ?- keep a list of all the medicines I take; vitamins and herbals too ?- use an alarm clock or phone to remind me to take my medicine  ?  ?Why is this important?   ?These steps will help you keep on track with your medicines. ?  ?Notes:  ?-Please continue to log your BS readings everyday  ?  ? ?  ? ? ?Patient Care Plan: Alec Solis  ?  ? ?Problem Identified: HTN, DM II   ?Priority: High  ?  ? ?Long-Range Goal: Disease Management   ?Recent Progress: On track  ?Priority: High  ?Note:   ? ?Current Barriers:  ?Unable to independently monitor therapeutic efficacy ? ?Pharmacist Clinical Goal(s):  ?Patient will achieve adherence to monitoring guidelines and medication adherence to achieve therapeutic efficacy through collaboration with PharmD and provider.  ? ?Interventions: ?1:1 collaboration with [name], MD regarding development and update of comprehensive plan of care as evidenced by provider attestation and co-signature ?Inter-disciplinary care team collaboration (see longitudinal plan of care) ?Comprehensive medication review performed; medication list updated in electronic medical record ? ?Hypertension (BP goal <140/90) ?-Controlled ?-Current treatment: ?Amlodipine 5 mg tablet once per day Appropriate, Effective, Safe, Accessible ?-Current home readings: 117/70, 120-128-130/70 ?-Current dietary habits: he is eating okay, he reports that he is eating less fried foods. ?-Current exercise habits: he is very active in  [place] and when he visits his daughter in [place].  ?-Denies hypotensive/hypertensive symptoms ?-Educated on Importance of home blood pressure monitoring; ?-Counseled to monitor BP at home at least once or twice per week, document, and provide log at future appointments ?-Recommended to continue current medication ? ?Diabetes (A1c goal <8%) ?-Controlled ?-Current medications: ?Tresiba 200 unit/ml - inject 18 units into the skin daily Appropriate, Effective, Safe, Accessible ?-Current home glucose readings ?fasting glucose: 4/4 -93, 4/5-110, 4/6- 96, 4/7-108, 4/8-97, 4/10 - 71, 4/11 -101, 4/12-82 ?post prandial glucose: 4/5-129, 4/6-102, 4/7-112, 4/8-108, 4/10-90, 4/11-101 ?-Reports hypoglycemic/hyperglycemic symptoms ?-Current meal patterns: he is not eating a full meal, he is only eating twice per day. ?-Current exercise: he walks everyday at least for a quarter mile.  ?-Educated on A1c and blood sugar goals; ?Prevention and management of hypoglycemic episodes; ?-Counseled to check feet daily and get yearly eye exams ?-Collaborated with PCP to decrease patients current insulin dose based on hypoglycemia ? ? ?Patient Goals/Self-Care Activities ?Patient will:  ?- take medications as prescribed as evidenced by patient report and record review ? ?Follow Up Plan: The patient has been provided with contact information for the care management team and has been advised to call with any health related  questions or concerns.  ?  ? ? ? ?Patient agreed to services and verbal consent obtained.  ? ?The patient verbalized understanding of instructions, educational materials, and care plan provided today and agreed to receive a mailed copy of patient instructions, educational materials, and care plan.  ? ?Orlando Penner, PharmD ?Clinical Pharmacist ?[facility] ?[phone] ?  ?

## 2021-12-21 DIAGNOSIS — Z794 Long term (current) use of insulin: Secondary | ICD-10-CM | POA: Diagnosis not present

## 2021-12-21 DIAGNOSIS — N1832 Chronic kidney disease, stage 3b: Secondary | ICD-10-CM

## 2021-12-21 DIAGNOSIS — E1122 Type 2 diabetes mellitus with diabetic chronic kidney disease: Secondary | ICD-10-CM | POA: Diagnosis not present

## 2021-12-21 DIAGNOSIS — I129 Hypertensive chronic kidney disease with stage 1 through stage 4 chronic kidney disease, or unspecified chronic kidney disease: Secondary | ICD-10-CM | POA: Diagnosis not present

## 2021-12-23 ENCOUNTER — Telehealth: Payer: Self-pay

## 2021-12-23 NOTE — Chronic Care Management (AMB) (Signed)
? ? ?Chronic Care Management ?Pharmacy Assistant  ? ?Name: Alec Solis  MRN: [id number] DOB: [birth year] ? ? ?Reason for Encounter: Disease State/ Diabetes ? ?Recent office visits:  ?None ? ?Recent consult visits:  ?None ? ?Hospital visits:  ?None in previous 6 months ? ?Medications: ?Outpatient Encounter Medications as of 12/23/2021  ?Medication Sig  ? acetaminophen (TYLENOL) 650 MG CR tablet Take 650 mg by mouth every 8 (eight) hours as needed for pain.   ? allopurinol (ZYLOPRIM) 100 MG tablet TAKE 1 TABLET BY MOUTH TWICE DAILY  ? amLODipine (NORVASC) 5 MG tablet Take 1 tablet (5 mg total) by mouth daily.  ? aspirin 81 MG tablet Take 81 mg by mouth daily.   ? B-D ULTRAFINE III SHORT PEN 31G X 8 MM MISC USE AS DIRECTED FOUR TIMES DAILY  ? calcitRIOL (ROCALTROL) 0.25 MCG capsule Take 0.25 mcg by mouth every other day.   ? carboxymethylcellulose (REFRESH PLUS) 0.5 % SOLN Place 1 drop into both eyes 3 (three) times daily as needed (Watery eyes).   ? carvedilol (COREG) 12.5 MG tablet Take 12.5 mg by mouth 2 (two) times daily with a meal.  ? ferrous sulfate 325 (65 FE) MG tablet Take 325 mg by mouth daily with breakfast.  ? furosemide (LASIX) 40 MG tablet Take 40 mg by mouth daily as needed for fluid or edema.   ? glucose blood (TRUE METRIX BLOOD GLUCOSE TEST) test strip Use as directed to check blood sugars 3 times per day dx: e11.65  ? insulin degludec (TRESIBA FLEXTOUCH) 200 UNIT/ML FlexTouch Pen Inject 18 Units into the skin daily.  ? Insulin Pen Needle 32G X 8 MM MISC Use as directed  ? naproxen sodium (ALEVE) 220 MG tablet Take 220 mg by mouth daily as needed (Body ache).  ? Omega-3 Fatty Acids (FISH OIL OMEGA-3 PO) Take 1,200 mg by mouth daily.   ? omeprazole (PRILOSEC) 40 MG capsule TAKE 1 CAPSULE BY MOUTH EVERY DAY BEFORE A MEAL  ? ondansetron (ZOFRAN) 4 MG tablet TAKE 1 TABLET(4 MG) BY MOUTH DAILY AS NEEDED FOR NAUSEA OR VOMITING  ? rosuvastatin (CRESTOR) 20 MG tablet TAKE 1 TABLET BY MOUTH DAILY ON MONDAY TO  FRIDAY  ? sodium bicarbonate 650 MG tablet Take 1,300 mg by mouth 2 (two) times daily.  ? TRUEplus Lancets 30G MISC USE AS DIRECTED TO TEST BLOOD SUGAR THREE TIMES DAILY  ? vitamin B-12 (CYANOCOBALAMIN) 1000 MCG tablet Take 1,000 mcg by mouth daily.  ? ?No facility-administered encounter medications on file as of 12/23/2021.  ?Recent Relevant Labs: ?Lab Results  ?Component Value Date/Time  ? HGBA1C 6.3 (H) 10/30/2021 09:16 AM  ? HGBA1C 6.2 (H) 07/29/2021 09:04 AM  ? HGBA1C 9.6 05/07/2021 12:00 AM  ? MICROALBUR 30 07/29/2021 09:47 AM  ? MICROALBUR 80 02/20/2020 11:46 AM  ?  ?Kidney Function ?Lab Results  ?Component Value Date/Time  ? CREATININE 2.03 (H) 10/30/2021 09:16 AM  ? CREATININE 2.07 (H) 07/29/2021 09:04 AM  ? GFRNONAA 27 (L) 10/01/2020 12:43 PM  ? GFRAA 31 (L) 10/01/2020 12:43 PM  ? ? ?Current antihyperglycemic regimen:  ?Tresiba 18 units daily ? ?What recent interventions/DTPs have been made to improve glycemic control:  ?Educated on A1c and blood sugar goals; ?Prevention and management of hypoglycemic episodes; ?-Counseled to check feet daily and get yearly eye exams ?-Collaborated with PCP to decrease patients current insulin dose based on hypoglycemia ? ?Have there been any recent hospitalizations or ED visits since last visit with CPP? No ? ?  Patient denies hypoglycemic symptoms ? ?Patient denies hyperglycemic symptoms ? ?How often are you checking your blood sugar? twice daily ? ?What are your blood sugars ranging?  ?Fasting: 160, 144, 120 ?Before meals: None ?After meals: 114, 120, 123 ?Bedtime: None ? ?During the week, how often does your blood glucose drop below 70? Never ? ?Are you checking your feet daily/regularly? Daily  ? ?Adherence Review: ?Is the patient currently on a STATIN medication? Yes ?Is the patient currently on ACE/ARB medication? No ?Does the patient have >5 day gap between last estimated fill dates? No ? ? ? ?Care Gaps: ?Yearly foot exam overdue ?AWV 02-26-2022 ? ?Star Rating  Drugs: ?Rosuvastatin 20 mg- Last filled 09-02-2021 90 DS [facility] ? ?Malecca Hicks CMA ?Clinical Pharmacist Assistant ?[phone] ? ?

## 2022-01-02 ENCOUNTER — Ambulatory Visit (INDEPENDENT_AMBULATORY_CARE_PROVIDER_SITE_OTHER): Payer: Medicare Other | Admitting: Podiatry

## 2022-01-02 ENCOUNTER — Encounter: Payer: Self-pay | Admitting: Podiatry

## 2022-01-02 DIAGNOSIS — B351 Tinea unguium: Secondary | ICD-10-CM

## 2022-01-02 DIAGNOSIS — B353 Tinea pedis: Secondary | ICD-10-CM | POA: Diagnosis not present

## 2022-01-02 DIAGNOSIS — L84 Corns and callosities: Secondary | ICD-10-CM

## 2022-01-02 DIAGNOSIS — Q828 Other specified congenital malformations of skin: Secondary | ICD-10-CM

## 2022-01-02 DIAGNOSIS — M79675 Pain in left toe(s): Secondary | ICD-10-CM | POA: Diagnosis not present

## 2022-01-02 DIAGNOSIS — N183 Chronic kidney disease, stage 3 unspecified: Secondary | ICD-10-CM | POA: Diagnosis not present

## 2022-01-02 DIAGNOSIS — E119 Type 2 diabetes mellitus without complications: Secondary | ICD-10-CM

## 2022-01-02 DIAGNOSIS — M79674 Pain in right toe(s): Secondary | ICD-10-CM

## 2022-01-02 DIAGNOSIS — E1122 Type 2 diabetes mellitus with diabetic chronic kidney disease: Secondary | ICD-10-CM

## 2022-01-02 MED ORDER — KETOCONAZOLE 2 % EX CREA: 1.0000 "application " | TOPICAL_CREAM | Freq: Every day | CUTANEOUS | 0 refills | Status: AC

## 2022-01-10 NOTE — Progress Notes (Signed)
ANNUAL DIABETIC FOOT EXAM  Subjective: Alec Solis presents today for annual diabetic foot examination.  Patient relates 20 year h/o diabetes.  Patient denies any h/o foot wounds.  Patient endorses numbness, tingling, burning, or pins/needle sensation in great toes.  Patient's blood sugar was 99 mg/dl today. Last known  HgA1c was 7% range   Risk factors: diabetes, diabetic neuropathy, h/o MI, HTN, CKD.  Alec Chard, MD is patient's PCP. Last visit was October 30, 2021.  Past Medical History:  Diagnosis Date   Anemia    Anxiety    Arthritis    Cataract    Chronic kidney disease    Depression    Diabetes mellitus without complication ([place])    type II    GERD (gastroesophageal reflux disease)    Gout    Heart murmur    Hypertension    Myocardial infarction ([place])    minor Mi- years ago -    Varicose veins of left lower extremity    Patient Active Problem List   Diagnosis Date Noted   PVC (premature ventricular contraction) 11/21/2021   Precordial chest pain 11/21/2021   Abnormal feces 03/20/2021   Abnormal weight loss 03/20/2021   Diarrhea 03/20/2021   Diverticular disease of colon 03/20/2021   Dysphagia 03/20/2021   Epigastric pain 03/20/2021   Hemorrhage of rectum and anus 03/20/2021   Iron deficiency anemia 03/20/2021   Nausea and vomiting 03/20/2021   Black stools 03/06/2020   Gastroesophageal reflux disease without esophagitis 03/06/2020   Paget's disease of bone 10/23/2018   Acute renal failure (ARF) ([place]) 06/11/2016   Hyperglycemia 06/11/2016   Deficiency anemia 12/09/2011   Type 2 diabetes mellitus with stage 3b chronic kidney disease, with long-term current use of insulin ([place]) 12/09/2011   Renal insufficiency 12/09/2011   Past Surgical History:  Procedure Laterality Date   BUNIONECTOMY     cataract     COLONOSCOPY WITH PROPOFOL N/A 05/28/2017   Procedure: COLONOSCOPY WITH PROPOFOL;  Surgeon: Carol Ada, MD;  Location: [facility];  Service:  Endoscopy;  Laterality: N/A;   COLONOSCOPY WITH PROPOFOL N/A 03/29/2020   Procedure: COLONOSCOPY WITH PROPOFOL;  Surgeon: Carol Ada, MD;  Location: [facility];  Service: Endoscopy;  Laterality: N/A;   ENTEROSCOPY N/A 03/29/2020   Procedure: ENTEROSCOPY;  Surgeon: Carol Ada, MD;  Location: [facility];  Service: Endoscopy;  Laterality: N/A;   ESOPHAGOGASTRODUODENOSCOPY (EGD) WITH PROPOFOL N/A 05/28/2017   Procedure: ESOPHAGOGASTRODUODENOSCOPY (EGD) WITH PROPOFOL;  Surgeon: Carol Ada, MD;  Location: [facility];  Service: Endoscopy;  Laterality: N/A;   HEMOSTASIS CLIP PLACEMENT  03/29/2020   Procedure: HEMOSTASIS CLIP PLACEMENT;  Surgeon: Carol Ada, MD;  Location: [facility];  Service: Endoscopy;;   HOT HEMOSTASIS N/A 03/29/2020   Procedure: HOT HEMOSTASIS (ARGON PLASMA COAGULATION/BICAP);  Surgeon: Carol Ada, MD;  Location: [facility];  Service: Endoscopy;  Laterality: N/A;   POLYPECTOMY  03/29/2020   Procedure: POLYPECTOMY;  Surgeon: Carol Ada, MD;  Location: [facility];  Service: Endoscopy;;   Current Outpatient Medications on File Prior to Visit  Medication Sig Dispense Refill   acetaminophen (TYLENOL) 650 MG CR tablet Take 650 mg by mouth every 8 (eight) hours as needed for pain.      allopurinol (ZYLOPRIM) 100 MG tablet TAKE 1 TABLET BY MOUTH TWICE DAILY 180 tablet 0   amLODipine (NORVASC) 5 MG tablet Take 1 tablet (5 mg total) by mouth daily. 90 tablet 1   aspirin 81 MG tablet Take 81 mg by mouth daily.  B-D ULTRAFINE III SHORT PEN 31G X 8 MM MISC USE AS DIRECTED FOUR TIMES DAILY 100 each 3   calcitRIOL (ROCALTROL) 0.25 MCG capsule Take 0.25 mcg by mouth every other day.      carboxymethylcellulose (REFRESH PLUS) 0.5 % SOLN Place 1 drop into both eyes 3 (three) times daily as needed (Watery eyes).      carvedilol (COREG) 12.5 MG tablet Take 12.5 mg by mouth 2 (two) times daily with a meal.     ferrous sulfate 325 (65 FE) MG tablet Take 325 mg by mouth daily  with breakfast.     furosemide (LASIX) 40 MG tablet Take 40 mg by mouth daily as needed for fluid or edema.      glucose blood (TRUE METRIX BLOOD GLUCOSE TEST) test strip Use as directed to check blood sugars 3 times per day dx: e11.65 300 each 5   insulin degludec (TRESIBA FLEXTOUCH) 200 UNIT/ML FlexTouch Pen Inject 18 Units into the skin daily. 27 mL 1   Insulin Pen Needle 32G X 8 MM MISC Use as directed 100 each 2   naproxen sodium (ALEVE) 220 MG tablet Take 220 mg by mouth daily as needed (Body ache).     Omega-3 Fatty Acids (FISH OIL OMEGA-3 PO) Take 1,200 mg by mouth daily.      omeprazole (PRILOSEC) 40 MG capsule TAKE 1 CAPSULE BY MOUTH EVERY DAY BEFORE A MEAL 90 capsule 1   ondansetron (ZOFRAN) 4 MG tablet TAKE 1 TABLET(4 MG) BY MOUTH DAILY AS NEEDED FOR NAUSEA OR VOMITING 30 tablet 1   rosuvastatin (CRESTOR) 20 MG tablet TAKE 1 TABLET BY MOUTH DAILY ON MONDAY TO FRIDAY 75 tablet 1   sodium bicarbonate 650 MG tablet Take 1,300 mg by mouth 2 (two) times daily.     TRUEplus Lancets 30G MISC USE AS DIRECTED TO TEST BLOOD SUGAR THREE TIMES DAILY 300 each 11   vitamin B-12 (CYANOCOBALAMIN) 1000 MCG tablet Take 1,000 mcg by mouth daily.     No current facility-administered medications on file prior to visit.    No Known Allergies Social History   Occupational History   Occupation: retired  Tobacco Use   Smoking status: Former    Packs/day: 0.50    Years: 50.00    Pack years: 25.00    Types: Cigarettes    Start date: 08/24/1952    Quit date: 08/24/2014    Years since quitting: 7.3   Smokeless tobacco: Never  Vaping Use   Vaping Use: Never used  Substance and Sexual Activity   Alcohol use: Yes    Comment: occassional wine beer   Drug use: No   Sexual activity: Not Currently   Family History  Problem Relation Age of Onset   Breast cancer Mother    Stomach cancer Mother    Diabetes Mellitus II Maternal Aunt    Immunization History  Administered Date(s) Administered    Influenza, High Dose Seasonal PF 05/25/2018, 04/19/2019   Influenza-Unspecified 05/24/2013, 05/31/2018, 05/07/2021   PFIZER(Purple Top)SARS-COV-2 Vaccination 10/23/2019, 11/15/2019, 09/12/2020   PNEUMOCOCCAL CONJUGATE-20 10/30/2021   Pfizer Covid-19 Vaccine Bivalent Booster 26yr & up 05/29/2021   Pneumococcal Polysaccharide-23 05/24/2013, 05/24/2017     Review of Systems: Negative except as noted in the HPI.   Objective: There were no vitals filed for this visit.  Alec Griswoldis a pleasant 88y.o. male in NAD. AAO X 3.  Vascular Examination: Capillary refill time to digits immediate b/l. Faintly palpable pedal pulses b/l. Pedal hair absent.  No pain with calf compression b/l. Lower extremity skin temperature gradient within normal limits. No edema noted b/l LE. No cyanosis or clubbing noted b/l LE.  Dermatological Examination: Pedal integument with normal turgor, texture and tone b/l LE. No open wounds b/l. No interdigital macerations. Toenails 1-5 b/l elongated, thickened, discolored with subungual debris. +Tenderness with dorsal palpation of nailplates. Hyperkeratotic lesion(s) noted submet head 2 left foot.  Porokeratotic lesion(s) noted bilateral great toes. Diffuse scaling noted peripherally and plantarly b/l feet.  No interdigital macerations.  No blisters, no weeping. No signs of secondary bacterial infection noted.  Neurological Examination: Protective sensation intact 5/5 intact bilaterally with 10g monofilament b/l. Vibratory sensation intact b/l.  Musculoskeletal Examination: Normal muscle strength 5/5 to all lower extremity muscle groups bilaterally. HAV with bunion deformity noted b/l LE. Hammertoe deformity noted 2-5 b/l.Marland Kitchen No pain, crepitus or joint limitation noted with ROM b/l LE.  Patient ambulates independently without assistive aids.  Footwear Assessment: Does the patient wear appropriate shoes? Yes. Does the patient need inserts/orthotics? Yes.     Latest Ref Rng  & Units 10/30/2021    9:16 AM 07/29/2021    9:04 AM 05/07/2021   12:00 AM  Hemoglobin A1C  Hemoglobin-A1c 4.8 - 5.6 % 6.3   6.2   9.6     Assessment: 1. Pain due to onychomycosis of toenails of both feet   2. Tinea pedis of both feet   3. Callus   4. Porokeratosis   5. Diabetes mellitus with stage 3 chronic kidney disease ([place])   6. Encounter for diabetic foot exam ([place])      ADA Risk Categorization: Low Risk :  Patient has all of the following: Intact protective sensation No prior foot ulcer  No severe deformity Pedal pulses present  Plan: -Patient was evaluated and treated. All patient's and/or POA's questions/concerns answered on today's visit. -Diabetic foot examination performed today. -Continue foot and shoe inspections daily. Monitor blood glucose per PCP/Endocrinologist's recommendations. -Patient to continue soft, supportive shoe gear daily. -Mycotic toenails 1-5 bilaterally were debrided in length and girth with sterile nail nippers and dremel without incident. -Callus(es) submet head 2 left foot pared utilizing sterile scalpel blade without complication or incident. Total number debrided =1. -Porokeratotic lesion(s) bilateral great toes pared and enucleated with sterile scalpel blade without incident. Total number of lesions debrided=2. -For tinea pedis, Rx sent to pharmacy for Ketoconazole Cream 2% to be applied once daily for six weeks. -Patient/POA to call should there be question/concern in the interim. Return in about 3 months (around 04/04/2022).  [name], [name]

## 2022-01-13 ENCOUNTER — Encounter (HOSPITAL_COMMUNITY): Payer: Self-pay | Admitting: *Deleted

## 2022-01-20 ENCOUNTER — Other Ambulatory Visit: Payer: Self-pay | Admitting: Internal Medicine

## 2022-01-21 ENCOUNTER — Other Ambulatory Visit: Payer: Self-pay | Admitting: Internal Medicine

## 2022-02-03 ENCOUNTER — Encounter: Payer: Medicare Other | Admitting: Internal Medicine

## 2022-02-03 NOTE — Patient Instructions (Signed)

## 2022-02-03 NOTE — Progress Notes (Signed)
He is a no show. He will be contacted to reschedule appt.

## 2022-02-19 ENCOUNTER — Encounter (HOSPITAL_COMMUNITY): Payer: Self-pay | Admitting: *Deleted

## 2022-02-23 ENCOUNTER — Telehealth: Payer: Self-pay

## 2022-02-23 NOTE — Chronic Care Management (AMB) (Signed)
Chronic Care Management Pharmacy Assistant   Name: Alec Solis  MRN: [id number] DOB: 04-12-1933   Reason for Encounter: Disease State/ Diabetes  Recent office visits:  None  Recent consult visits:  01-02-2022 [name], [name] (Podiatry). Toenails 1-5 b/l elongated, thickened, discolored with subungual debris. +Tenderness with dorsal palpation of nailplates.  Hospital visits:  None in previous 6 months  Medications: Outpatient Encounter Medications as of 02/23/2022  Medication Sig   acetaminophen (TYLENOL) 650 MG CR tablet Take 650 mg by mouth every 8 (eight) hours as needed for pain.    allopurinol (ZYLOPRIM) 100 MG tablet TAKE 1 TABLET BY MOUTH TWICE DAILY   amLODipine (NORVASC) 5 MG tablet TAKE 1 TABLET(5 MG) BY MOUTH DAILY   aspirin 81 MG tablet Take 81 mg by mouth daily.    B-D ULTRAFINE III SHORT PEN 31G X 8 MM MISC USE AS DIRECTED FOUR TIMES DAILY   calcitRIOL (ROCALTROL) 0.25 MCG capsule Take 0.25 mcg by mouth every other day.    carboxymethylcellulose (REFRESH PLUS) 0.5 % SOLN Place 1 drop into both eyes 3 (three) times daily as needed (Watery eyes).    carvedilol (COREG) 12.5 MG tablet Take 12.5 mg by mouth 2 (two) times daily with a meal.   ferrous sulfate 325 (65 FE) MG tablet Take 325 mg by mouth daily with breakfast.   furosemide (LASIX) 40 MG tablet Take 40 mg by mouth daily as needed for fluid or edema.    glucose blood (TRUE METRIX BLOOD GLUCOSE TEST) test strip Use as directed to check blood sugars 3 times per day dx: e11.65   insulin degludec (TRESIBA FLEXTOUCH) 200 UNIT/ML FlexTouch Pen Inject 18 Units into the skin daily.   Insulin Pen Needle 32G X 8 MM MISC Use as directed   ketoconazole (NIZORAL) 2 % cream Apply 1 application. topically daily. Apply to both feet and between toes once daily for 6 weeks.   naproxen sodium (ALEVE) 220 MG tablet Take 220 mg by mouth daily as needed (Body ache).   Omega-3 Fatty Acids (FISH OIL OMEGA-3 PO) Take 1,200 mg  by mouth daily.    omeprazole (PRILOSEC) 40 MG capsule TAKE 1 CAPSULE BY MOUTH EVERY DAY BEFORE A MEAL   ondansetron (ZOFRAN) 4 MG tablet TAKE 1 TABLET(4 MG) BY MOUTH DAILY AS NEEDED FOR NAUSEA OR VOMITING   rosuvastatin (CRESTOR) 20 MG tablet TAKE 1 TABLET BY MOUTH DAILY ON MONDAY TO FRIDAY   sodium bicarbonate 650 MG tablet Take 1,300 mg by mouth 2 (two) times daily.   TRUEplus Lancets 30G MISC USE AS DIRECTED TO TEST BLOOD SUGAR THREE TIMES DAILY   vitamin B-12 (CYANOCOBALAMIN) 1000 MCG tablet Take 1,000 mcg by mouth daily.   No facility-administered encounter medications on file as of 02/23/2022.   Recent Relevant Labs: Lab Results  Component Value Date/Time   HGBA1C 6.3 (H) 10/30/2021 09:16 AM   HGBA1C 6.2 (H) 07/29/2021 09:04 AM   HGBA1C 9.6 05/07/2021 12:00 AM   MICROALBUR 30 07/29/2021 09:47 AM   MICROALBUR 80 02/20/2020 11:46 AM    Kidney Function Lab Results  Component Value Date/Time   CREATININE 2.03 (H) 10/30/2021 09:16 AM   CREATININE 2.07 (H) 07/29/2021 09:04 AM   GFRNONAA 27 (L) 10/01/2020 12:43 PM   GFRAA 31 (L) 10/01/2020 12:43 PM    Current antihyperglycemic regimen:  Tresiba 18 units daily  What recent interventions/DTPs have been made to improve glycemic control:  Educated on A1c and blood sugar goals; Prevention and  management of hypoglycemic episodes; -Counseled to check feet daily and get yearly eye exams -Collaborated with PCP to decrease patients current insulin dose based on hypoglycemia  Have there been any recent hospitalizations or ED visits since last visit with CPP? No  Patient denies hypoglycemic symptoms  Patient denies hyperglycemic symptoms  How often are you checking your blood sugar? once daily  What are your blood sugars ranging?  Fasting: 72,  164, 103, 94 Before meals: None After meals: 145, 110, 108, 134 Bedtime: None  During the week, how often does your blood glucose drop below 70? Never  Are you checking your feet  daily/regularly? Daily  Adherence Review: Is the patient currently on a STATIN medication? Yes Is the patient currently on ACE/ARB medication? No Does the patient have >5 day gap between last estimated fill dates? No  Care Gaps: AWV 02-26-2022 Shingrix overdue  Star Rating Drugs: Rosuvastatin 20 mg- Last filled 02-19-2022 90 DS [facility].  [place] Pharmacist Assistant [phone]

## 2022-02-26 ENCOUNTER — Ambulatory Visit: Payer: Medicare Other | Admitting: Internal Medicine

## 2022-02-26 ENCOUNTER — Ambulatory Visit: Payer: Medicare Other

## 2022-02-26 ENCOUNTER — Telehealth: Payer: Self-pay

## 2022-02-26 NOTE — Telephone Encounter (Signed)
This nurse called patient in regards to missing scheduled AWV. Patient states that he is currently in [place] at an appointment with his daughter. Says he will be back around  Wednesday and would like a call then to reschedule.

## 2022-03-11 ENCOUNTER — Ambulatory Visit (INDEPENDENT_AMBULATORY_CARE_PROVIDER_SITE_OTHER): Payer: Medicare Other

## 2022-03-11 ENCOUNTER — Other Ambulatory Visit: Payer: Self-pay

## 2022-03-11 VITALS — BP 114/60 | HR 68 | Temp 98.8°F | Ht 70.0 in | Wt 178.8 lb

## 2022-03-11 DIAGNOSIS — Z0000 Encounter for general adult medical examination without abnormal findings: Secondary | ICD-10-CM | POA: Diagnosis not present

## 2022-03-11 DIAGNOSIS — E1122 Type 2 diabetes mellitus with diabetic chronic kidney disease: Secondary | ICD-10-CM

## 2022-03-11 MED ORDER — OMEPRAZOLE 40 MG PO CPDR: 40.0000 mg | DELAYED_RELEASE_CAPSULE | Freq: Every day | ORAL | 1 refills | Status: DC

## 2022-03-11 MED ORDER — TRESIBA FLEXTOUCH 200 UNIT/ML ~~LOC~~ SOPN: 18.0000 [IU] | PEN_INJECTOR | Freq: Every day | SUBCUTANEOUS | 1 refills | Status: AC

## 2022-03-11 NOTE — Patient Instructions (Signed)
Alec Solis , Thank you for taking time to come for your Medicare Wellness Visit. I appreciate your ongoing commitment to your health goals. Please review the following plan we discussed and let me know if I can assist you in the future.   Screening recommendations/referrals: Colonoscopy: not required Recommended yearly ophthalmology/optometry visit for glaucoma screening and checkup Recommended yearly dental visit for hygiene and checkup  Vaccinations: Influenza vaccine: due 03/24/2022 Pneumococcal vaccine: completed 10/30/2021 Tdap vaccine: completed 05/22/2013, due 05/23/2023 Shingles vaccine: discussed   Covid-19:  05/29/2021, 09/12/2020, 11/15/2019, 10/23/2019  Advanced directives: Please bring a copy of your POA (Power of Attorney) and/or Living Will to your next appointment.   Conditions/risks identified: none  Next appointment: Follow up in one year for your annual wellness visit.   Preventive Care 13 Years and Older, Male Preventive care refers to lifestyle choices and visits with your health care provider that can promote health and wellness. What does preventive care include? A yearly physical exam. This is also called an annual well check. Dental exams once or twice a year. Routine eye exams. Ask your health care provider how often you should have your eyes checked. Personal lifestyle choices, including: Daily care of your teeth and gums. Regular physical activity. Eating a healthy diet. Avoiding tobacco and drug use. Limiting alcohol use. Practicing safe sex. Taking low doses of aspirin every day. Taking vitamin and mineral supplements as recommended by your health care provider. What happens during an annual well check? The services and screenings done by your health care provider during your annual well check will depend on your age, overall health, lifestyle risk factors, and family history of disease. Counseling  Your health care provider may ask you questions about  your: Alcohol use. Tobacco use. Drug use. Emotional well-being. Home and relationship well-being. Sexual activity. Eating habits. History of falls. Memory and ability to understand (cognition). Work and work Statistician. Screening  You may have the following tests or measurements: Height, weight, and BMI. Blood pressure. Lipid and cholesterol levels. These may be checked every 5 years, or more frequently if you are over 64 years old. Skin check. Lung cancer screening. You may have this screening every year starting at age 101 if you have a 30-pack-year history of smoking and currently smoke or have quit within the past 15 years. Fecal occult blood test (FOBT) of the stool. You may have this test every year starting at age 94. Flexible sigmoidoscopy or colonoscopy. You may have a sigmoidoscopy every 5 years or a colonoscopy every 10 years starting at age 77. Prostate cancer screening. Recommendations will vary depending on your family history and other risks. Hepatitis C blood test. Hepatitis B blood test. Sexually transmitted disease (STD) testing. Diabetes screening. This is done by checking your blood sugar (glucose) after you have not eaten for a while (fasting). You may have this done every 1-3 years. Abdominal aortic aneurysm (AAA) screening. You may need this if you are a current or former smoker. Osteoporosis. You may be screened starting at age 66 if you are at high risk. Talk with your health care provider about your test results, treatment options, and if necessary, the need for more tests. Vaccines  Your health care provider may recommend certain vaccines, such as: Influenza vaccine. This is recommended every year. Tetanus, diphtheria, and acellular pertussis (Tdap, Td) vaccine. You may need a Td booster every 10 years. Zoster vaccine. You may need this after age 34. Pneumococcal 13-valent conjugate (PCV13) vaccine. One dose is  recommended after age 66. Pneumococcal  polysaccharide (PPSV23) vaccine. One dose is recommended after age 43. Talk to your health care provider about which screenings and vaccines you need and how often you need them. This information is not intended to replace advice given to you by your health care provider. Make sure you discuss any questions you have with your health care provider. Document Released: 09/06/2015 Document Revised: 04/29/2016 Document Reviewed: 06/11/2015 Elsevier Interactive Patient Education  2017 [place] Prevention in the Home Falls can cause injuries. They can happen to people of all ages. There are many things you can do to make your home safe and to help prevent falls. What can I do on the outside of my home? Regularly fix the edges of walkways and driveways and fix any cracks. Remove anything that might make you trip as you walk through a door, such as a raised step or threshold. Trim any bushes or trees on the path to your home. Use bright outdoor lighting. Clear any walking paths of anything that might make someone trip, such as rocks or tools. Regularly check to see if handrails are loose or broken. Make sure that both sides of any steps have handrails. Any raised decks and porches should have guardrails on the edges. Have any leaves, snow, or ice cleared regularly. Use sand or salt on walking paths during winter. Clean up any spills in your garage right away. This includes oil or grease spills. What can I do in the bathroom? Use night lights. Install grab bars by the toilet and in the tub and shower. Do not use towel bars as grab bars. Use non-skid mats or decals in the tub or shower. If you need to sit down in the shower, use a plastic, non-slip stool. Keep the floor dry. Clean up any water that spills on the floor as soon as it happens. Remove soap buildup in the tub or shower regularly. Attach bath mats securely with double-sided non-slip rug tape. Do not have throw rugs and other  things on the floor that can make you trip. What can I do in the bedroom? Use night lights. Make sure that you have a light by your bed that is easy to reach. Do not use any sheets or blankets that are too big for your bed. They should not hang down onto the floor. Have a firm chair that has side arms. You can use this for support while you get dressed. Do not have throw rugs and other things on the floor that can make you trip. What can I do in the kitchen? Clean up any spills right away. Avoid walking on wet floors. Keep items that you use a lot in easy-to-reach places. If you need to reach something above you, use a strong step stool that has a grab bar. Keep electrical cords out of the way. Do not use floor polish or wax that makes floors slippery. If you must use wax, use non-skid floor wax. Do not have throw rugs and other things on the floor that can make you trip. What can I do with my stairs? Do not leave any items on the stairs. Make sure that there are handrails on both sides of the stairs and use them. Fix handrails that are broken or loose. Make sure that handrails are as long as the stairways. Check any carpeting to make sure that it is firmly attached to the stairs. Fix any carpet that is loose or worn. Avoid having  throw rugs at the top or bottom of the stairs. If you do have throw rugs, attach them to the floor with carpet tape. Make sure that you have a light switch at the top of the stairs and the bottom of the stairs. If you do not have them, ask someone to add them for you. What else can I do to help prevent falls? Wear shoes that: Do not have high heels. Have rubber bottoms. Are comfortable and fit you well. Are closed at the toe. Do not wear sandals. If you use a stepladder: Make sure that it is fully opened. Do not climb a closed stepladder. Make sure that both sides of the stepladder are locked into place. Ask someone to hold it for you, if possible. Clearly  mark and make sure that you can see: Any grab bars or handrails. First and last steps. Where the edge of each step is. Use tools that help you move around (mobility aids) if they are needed. These include: Canes. Walkers. Scooters. Crutches. Turn on the lights when you go into a dark area. Replace any light bulbs as soon as they burn out. Set up your furniture so you have a clear path. Avoid moving your furniture around. If any of your floors are uneven, fix them. If there are any pets around you, be aware of where they are. Review your medicines with your doctor. Some medicines can make you feel dizzy. This can increase your chance of falling. Ask your doctor what other things that you can do to help prevent falls. This information is not intended to replace advice given to you by your health care provider. Make sure you discuss any questions you have with your health care provider. Document Released: 06/06/2009 Document Revised: 01/16/2016 Document Reviewed: 09/14/2014 Elsevier Interactive Patient Education  2017 [employer].

## 2022-03-11 NOTE — Progress Notes (Signed)
Subjective:   Alec Solis is a 88 y.o. male who presents for Medicare Annual/Subsequent preventive examination.  Review of Systems     Cardiac Risk Factors include: advanced age (>66mn, >>34women);diabetes mellitus     Objective:    Today's Vitals   03/11/22 1432 03/11/22 1436  BP: 114/60   Pulse: 68   Temp: 98.8 F (37.1 C)   TempSrc: Oral   SpO2: 98%   Weight: 178 lb 12.8 oz (81.1 kg)   Height: '5\' 10"'$  (1.778 m)   PainSc:  2    Body mass index is 25.66 kg/m.     03/11/2022    2:40 PM 01/30/2021    9:04 AM 03/29/2020    8:38 AM 05/23/2019    3:51 PM 05/28/2017    8:13 AM 05/27/2017    9:29 AM 06/11/2016    3:28 PM  Advanced Directives  Does Patient Have a Medical Advance Directive? Yes No No No No No No  Type of [place] will        Copy of H[place] Brow[place]in Chart? No - copy requested     No - copy requested   Would patient like information on creating a medical advance directive?   No - Patient declined  No - Patient declined  No - patient declined information    Current Medications (verified) Outpatient Encounter Medications as of 03/11/2022  Medication Sig   acetaminophen (TYLENOL) 650 MG CR tablet Take 650 mg by mouth every 8 (eight) hours as needed for pain.    allopurinol (ZYLOPRIM) 100 MG tablet TAKE 1 TABLET BY MOUTH TWICE DAILY   amLODipine (NORVASC) 5 MG tablet TAKE 1 TABLET(5 MG) BY MOUTH DAILY   aspirin 81 MG tablet Take 81 mg by mouth daily.    B-D ULTRAFINE III SHORT PEN 31G X 8 MM MISC USE AS DIRECTED FOUR TIMES DAILY   calcitRIOL (ROCALTROL) 0.25 MCG capsule Take 0.25 mcg by mouth every other day.    carboxymethylcellulose (REFRESH PLUS) 0.5 % SOLN Place 1 drop into both eyes 3 (three) times daily as needed (Watery eyes).    carvedilol (COREG) 12.5 MG tablet Take 12.5 mg by mouth 2 (two) times daily with a meal.   ferrous sulfate 325 (65 FE) MG tablet Take 325 mg by mouth daily with breakfast.    furosemide (LASIX) 40 MG tablet Take 40 mg by mouth daily as needed for fluid or edema.    glucose blood (TRUE METRIX BLOOD GLUCOSE TEST) test strip Use as directed to check blood sugars 3 times per day dx: e11.65   insulin degludec (TRESIBA FLEXTOUCH) 200 UNIT/ML FlexTouch Pen Inject 18 Units into the skin daily.   Insulin Pen Needle 32G X 8 MM MISC Use as directed   naproxen sodium (ALEVE) 220 MG tablet Take 220 mg by mouth daily as needed (Body ache).   Omega-3 Fatty Acids (FISH OIL OMEGA-3 PO) Take 1,200 mg by mouth daily.    omeprazole (PRILOSEC) 40 MG capsule TAKE 1 CAPSULE BY MOUTH EVERY DAY BEFORE A MEAL   ondansetron (ZOFRAN) 4 MG tablet TAKE 1 TABLET(4 MG) BY MOUTH DAILY AS NEEDED FOR NAUSEA OR VOMITING   rosuvastatin (CRESTOR) 20 MG tablet TAKE 1 TABLET BY MOUTH DAILY ON MONDAY TO FRIDAY   sodium bicarbonate 650 MG tablet Take 1,300 mg by mouth 2 (two) times daily.   TRUEplus Lancets 30G MISC USE AS DIRECTED TO TEST BLOOD SUGAR THREE TIMES DAILY  vitamin B-12 (CYANOCOBALAMIN) 1000 MCG tablet Take 1,000 mcg by mouth daily.   No facility-administered encounter medications on file as of 03/11/2022.    Allergies (verified) Patient has no known allergies.   History: Past Medical History:  Diagnosis Date   Anemia    Anxiety    Arthritis    Cataract    Chronic kidney disease    Depression    Diabetes mellitus without complication (HCC)    type II    GERD (gastroesophageal reflux disease)    Gout    Heart murmur    Hypertension    Myocardial infarction ([place])    minor Mi- years ago -    Varicose veins of left lower extremity    Past Surgical History:  Procedure Laterality Date   BUNIONECTOMY     cataract     COLONOSCOPY WITH PROPOFOL N/A 05/28/2017   Procedure: COLONOSCOPY WITH PROPOFOL;  Surgeon: Carol Ada, MD;  Location: [facility];  Service: Endoscopy;  Laterality: N/A;   COLONOSCOPY WITH PROPOFOL N/A 03/29/2020   Procedure: COLONOSCOPY WITH PROPOFOL;  Surgeon:  Carol Ada, MD;  Location: [facility];  Service: Endoscopy;  Laterality: N/A;   ENTEROSCOPY N/A 03/29/2020   Procedure: ENTEROSCOPY;  Surgeon: Carol Ada, MD;  Location: [facility];  Service: Endoscopy;  Laterality: N/A;   ESOPHAGOGASTRODUODENOSCOPY (EGD) WITH PROPOFOL N/A 05/28/2017   Procedure: ESOPHAGOGASTRODUODENOSCOPY (EGD) WITH PROPOFOL;  Surgeon: Carol Ada, MD;  Location: [facility];  Service: Endoscopy;  Laterality: N/A;   HEMOSTASIS CLIP PLACEMENT  03/29/2020   Procedure: HEMOSTASIS CLIP PLACEMENT;  Surgeon: Carol Ada, MD;  Location: [facility];  Service: Endoscopy;;   HOT HEMOSTASIS N/A 03/29/2020   Procedure: HOT HEMOSTASIS (ARGON PLASMA COAGULATION/BICAP);  Surgeon: Carol Ada, MD;  Location: [facility];  Service: Endoscopy;  Laterality: N/A;   POLYPECTOMY  03/29/2020   Procedure: POLYPECTOMY;  Surgeon: Carol Ada, MD;  Location: [facility];  Service: Endoscopy;;   Family History  Problem Relation Age of Onset   Breast cancer Mother    Stomach cancer Mother    Diabetes Mellitus II Maternal Aunt    Social History   Socioeconomic History   Marital status: Married    Spouse name: Not on file   Number of children: 1   Years of education: Not on file   Highest education level: Not on file  Occupational History   Occupation: retired  Tobacco Use   Smoking status: Former    Packs/day: 0.50    Years: 50.00    Total pack years: 25.00    Types: Cigarettes    Start date: 08/24/1952    Quit date: 08/24/2014    Years since quitting: 7.5   Smokeless tobacco: Never  Vaping Use   Vaping Use: Never used  Substance and Sexual Activity   Alcohol use: Yes    Comment: occassional wine beer   Drug use: No   Sexual activity: Not Currently  Other Topics Concern   Not on file  Social History Narrative   Not on file   Social Determinants of Health   Financial Resource Strain: Low Risk  (03/11/2022)   Overall Financial Resource Strain (CARDIA)    Difficulty of  Paying Living Expenses: Not hard at all  Food Insecurity: No Food Insecurity (03/11/2022)   Hunger Vital Sign    Worried About Running Out of Food in the Last Year: Never true    [place] in the Last Year: Never true  Transportation Needs: No Transportation Needs (03/11/2022)  PRAPARE - Hydrologist (Medical): No    Lack of Transportation (Non-Medical): No  Physical Activity: Inactive (03/11/2022)   Exercise Vital Sign    Days of Exercise per Week: 0 days    Minutes of Exercise per Session: 0 min  Stress: No Stress Concern Present (03/11/2022)   [place]    Feeling of Stress : Not at all  Social Connections: Not on file    Tobacco Counseling Counseling given: Not Answered   Clinical Intake:  Pre-visit preparation completed: Yes  Pain : 0-10 Pain Score: 2  Pain Type: Acute pain Pain Location: Rib cage Pain Orientation: Right Pain Descriptors / Indicators: Aching Pain Onset: In the past 7 days Pain Frequency: Constant     Nutritional Status: BMI 25 -29 Overweight Nutritional Risks: None Diabetes: Yes  How often do you need to have someone help you when you read instructions, pamphlets, or other written materials from your doctor or pharmacy?: 1 - Never  Diabetic? Yes Nutrition Risk Assessment:  Has the patient had any N/V/D within the last 2 months?  No  Does the patient have any non-healing wounds?  No  Has the patient had any unintentional weight loss or weight gain?  No   Diabetes:  Is the patient diabetic?  Yes  If diabetic, was a CBG obtained today?  No  Did the patient bring in their glucometer from home?  No  How often do you monitor your CBG's? daily.   Financial Strains and Diabetes Management:  Are you having any financial strains with the device, your supplies or your medication? No .  Does the patient want to be seen by Chronic Care Management for  management of their diabetes?  No  Would the patient like to be referred to a Nutritionist or for Diabetic Management?  No   Diabetic Exams:  Diabetic Eye Exam: Completed 10/27/2021 Diabetic Foot Exam: Completed 01/02/2022   Interpreter Needed?: No  Information entered by :: NAllen LPN   Activities of Daily Living    03/11/2022    2:41 PM  In your present state of health, do you have any difficulty performing the following activities:  Hearing? 0  Vision? 0  Comment gets blurried sometimes  Difficulty concentrating or making decisions? 1  Walking or climbing stairs? 0  Dressing or bathing? 0  Doing errands, shopping? 0  Preparing Food and eating ? N  Using the Toilet? N  In the past six months, have you accidently leaked urine? Y  Do you have problems with loss of bowel control? N  Comment once  Managing your Medications? N  Managing your Finances? N  Housekeeping or managing your Housekeeping? N    Patient Care Team: [name], MD as PCP - General (Internal Medicine) Mayford Knife, [facility] (Pharmacist)  Indicate any recent Medical Services you may have received from other than [facility] providers in the past year (date may be approximate).     Assessment:   This is a routine wellness examination for [place].  Hearing/Vision screen Vision Screening - Comments:: Regular eye exams, [facility]  Dietary issues and exercise activities discussed: Current Exercise Habits: The patient does not participate in regular exercise at present   Goals Addressed             This Visit's Progress    Patient Stated       03/11/2022, stay alive  Depression Screen    03/11/2022    2:41 PM 10/30/2021    8:57 AM 09/10/2021    3:38 PM 06/17/2021   11:39 AM 01/30/2021    9:05 AM 09/04/2020   10:31 AM 07/01/2020    2:16 PM  PHQ 2/9 Scores  PHQ - 2 Score 0 0 6 2 0 4 2  PHQ- 9 Score   '13 5  10 12    '$ Fall Risk    03/11/2022    2:41 PM 01/30/2021    9:05 AM 01/09/2021     9:13 AM 05/23/2019    3:51 PM 01/18/2019    8:18 PM  Fall Risk   Falls in the past year? 0 0 0 0 1  Number falls in past yr: 0      Injury with Fall? 0      Risk for fall due to : Medication side effect Medication side effect  Medication side effect   Follow up Falls evaluation completed;Education provided;Falls prevention discussed Falls evaluation completed;Education provided;Falls prevention discussed  Falls evaluation completed;Falls prevention discussed     FALL RISK PREVENTION PERTAINING TO THE HOME:  Any stairs in or around the home? Yes  If so, are there any without handrails? No  Home free of loose throw rugs in walkways, pet beds, electrical cords, etc? Yes  Adequate lighting in your home to reduce risk of falls? Yes   ASSISTIVE DEVICES UTILIZED TO PREVENT FALLS:  Life alert? No  Use of a cane, walker or w/c? No  Grab bars in the bathroom? No  Shower chair or bench in shower? Yes  Elevated toilet seat or a handicapped toilet? No   TIMED UP AND GO:  Was the test performed? No .    Gait steady and fast without use of assistive device  Cognitive Function:        03/11/2022    2:43 PM 01/30/2021    9:07 AM 05/23/2019    3:55 PM  6CIT Screen  What Year? 0 points 0 points 0 points  What month? 0 points 0 points 0 points  What time? 0 points 3 points 0 points  Count back from 20 0 points 0 points 0 points  Months in reverse 0 points 0 points 2 points  Repeat phrase 0 points 2 points 0 points  Total Score 0 points 5 points 2 points    Immunizations Immunization History  Administered Date(s) Administered   Influenza, High Dose Seasonal PF 05/25/2018, 04/19/2019   Influenza-Unspecified 05/24/2013, 05/31/2018, 05/07/2021   PFIZER(Purple Top)SARS-COV-2 Vaccination 10/23/2019, 11/15/2019, 09/12/2020   PNEUMOCOCCAL CONJUGATE-20 10/30/2021   Pfizer Covid-19 Vaccine Bivalent Booster 79yr & up 05/29/2021   Pneumococcal Polysaccharide-23 05/24/2013, 05/24/2017    TDAP  status: Up to date  Flu Vaccine status: Up to date  Pneumococcal vaccine status: Up to date  Covid-19 vaccine status: Completed vaccines  Qualifies for Shingles Vaccine? Yes   Zostavax completed No   Shingrix Completed?: No.    Education has been provided regarding the importance of this vaccine. Patient has been advised to call insurance company to determine out of pocket expense if they have not yet received this vaccine. Advised may also receive vaccine at local pharmacy or Health Dept. Verbalized acceptance and understanding.  Screening Tests Health Maintenance  Topic Date Due   Zoster Vaccines- Shingrix (1 of 2) 06/11/2022 (Originally 05/30/1952)   INFLUENZA VACCINE  03/24/2022   HEMOGLOBIN A1C  05/02/2022   URINE MICROALBUMIN  07/29/2022   OPHTHALMOLOGY EXAM  10/28/2022   FOOT EXAM  01/03/2023   TETANUS/TDAP  05/23/2023   Pneumonia Vaccine 52+ Years old  Completed   COVID-19 Vaccine  Completed   HPV VACCINES  Aged Out    Health Maintenance  There are no preventive care reminders to display for this patient.   Colorectal cancer screening: No longer required.   Lung Cancer Screening: (Low Dose CT Chest recommended if Age 57-80 years, 30 pack-year currently smoking OR have quit w/in 15years.) does not qualify.   Lung Cancer Screening Referral: no  Additional Screening:  Hepatitis C Screening: does not qualify;   Vision Screening: Recommended annual ophthalmology exams for early detection of glaucoma and other disorders of the eye. Is the patient up to date with their annual eye exam?  Yes  Who is the provider or what is the name of the office in which the patient attends annual eye exams? [facility] If pt is not established with a provider, would they like to be referred to a provider to establish care? No .   Dental Screening: Recommended annual dental exams for proper oral hygiene  Community Resource Referral / Chronic Care Management: CRR required this  visit?  No   CCM required this visit?  No      Plan:     I have personally reviewed and noted the following in the patient's chart:   Medical and social history Use of alcohol, tobacco or illicit drugs  Current medications and supplements including opioid prescriptions. Patient is not currently taking opioid prescriptions. Functional ability and status Nutritional status Physical activity Advanced directives List of other physicians Hospitalizations, surgeries, and ER visits in previous 12 months Vitals Screenings to include cognitive, depression, and falls Referrals and appointments  In addition, I have reviewed and discussed with patient certain preventive protocols, quality metrics, and best practice recommendations. A written personalized care plan for preventive services as well as general preventive health recommendations were provided to patient.     Kellie Simmering, LPN   [id number]   Nurse Notes: none

## 2022-03-25 ENCOUNTER — Telehealth: Payer: Self-pay

## 2022-03-25 NOTE — Telephone Encounter (Signed)
Patient reports that he needs more "syringes" he currently has insulin on hand. HC to follow up with [employer] nordisk to confirm patients application was completed, sent and received. Patient agreed with this plan, sample given.   Orlando Penner, CPP, PharmD Clinical Pharmacist Practitioner [facility] [phone]

## 2022-03-26 ENCOUNTER — Other Ambulatory Visit: Payer: Self-pay

## 2022-03-26 ENCOUNTER — Telehealth: Payer: Self-pay

## 2022-03-26 MED ORDER — INSULIN PEN NEEDLE 32G X 8 MM MISC: 2 refills | Status: AC

## 2022-03-26 NOTE — Chronic Care Management (AMB) (Addendum)
Chronic Care Management Pharmacy Assistant   Name: Jeshurun O[place]  MRN: [id number] DOB: [birth year]  Reason for Encounter: Patient assistance  03-26-2022: Contacted [employer] to follow up with tresiba. Was informed that a request for insurance information was faxed to [facility]  in April. Since information wasn't faxed in the patient will have to reenroll due to being past 90 days. Sent a task to [place] to Administrator, Civil Service. Asked patient if he received a sample from office yesterday and he stated he didn't and Orlando Penner was suppose to send in insulin pen needles to [facility]. [facility] stated [facility] and needles weren't sent in. Patient also informed me that he has been receiving tresiba samples from [facility]. Sent urgent message to Orlando Penner and [place] to send rx for needles. Spoke with Orlando Penner and she stated she spoke with patient after he came by the office and she placed a Sample is in the refrigerator with his name and BOB. Patient staated he will go by the office to pick it up today.  04-01-2022: Contacted patient requesting 2023 SSI statement to process novo application. Patient stated he will drop off statement once he looks for it.  Medications: Outpatient Encounter Medications as of 03/26/2022  Medication Sig   acetaminophen (TYLENOL) 650 MG CR tablet Take 650 mg by mouth every 8 (eight) hours as needed for pain.    allopurinol (ZYLOPRIM) 100 MG tablet TAKE 1 TABLET BY MOUTH TWICE DAILY   amLODipine (NORVASC) 5 MG tablet TAKE 1 TABLET(5 MG) BY MOUTH DAILY   aspirin 81 MG tablet Take 81 mg by mouth daily.    B-D ULTRAFINE III SHORT PEN 31G X 8 MM MISC USE AS DIRECTED FOUR TIMES DAILY   calcitRIOL (ROCALTROL) 0.25 MCG capsule Take 0.25 mcg by mouth every other day.    carboxymethylcellulose (REFRESH PLUS) 0.5 % SOLN Place 1 drop into both eyes 3 (three) times daily as needed (Watery eyes).    carvedilol (COREG) 12.5 MG tablet Take 12.5 mg by mouth 2 (two) times daily with a  meal.   ferrous sulfate 325 (65 FE) MG tablet Take 325 mg by mouth daily with breakfast.   furosemide (LASIX) 40 MG tablet Take 40 mg by mouth daily as needed for fluid or edema.    glucose blood (TRUE METRIX BLOOD GLUCOSE TEST) test strip Use as directed to check blood sugars 3 times per day dx: e11.65   insulin degludec (TRESIBA FLEXTOUCH) 200 UNIT/ML FlexTouch Pen Inject 18 Units into the skin daily.   Insulin Pen Needle 32G X 8 MM MISC Use as directed   naproxen sodium (ALEVE) 220 MG tablet Take 220 mg by mouth daily as needed (Body ache).   Omega-3 Fatty Acids (FISH OIL OMEGA-3 PO) Take 1,200 mg by mouth daily.    omeprazole (PRILOSEC) 40 MG capsule Take 1 capsule (40 mg total) by mouth daily.   ondansetron (ZOFRAN) 4 MG tablet TAKE 1 TABLET(4 MG) BY MOUTH DAILY AS NEEDED FOR NAUSEA OR VOMITING   rosuvastatin (CRESTOR) 20 MG tablet TAKE 1 TABLET BY MOUTH DAILY ON MONDAY TO FRIDAY   sodium bicarbonate 650 MG tablet Take 1,300 mg by mouth 2 (two) times daily.   TRUEplus Lancets 30G MISC USE AS DIRECTED TO TEST BLOOD SUGAR THREE TIMES DAILY   vitamin B-12 (CYANOCOBALAMIN) 1000 MCG tablet Take 1,000 mcg by mouth daily.   No facility-administered encounter medications on file as of 03/26/2022.    [place] Pharmacist Assistant  [phone]  

## 2022-03-27 ENCOUNTER — Telehealth: Payer: Self-pay

## 2022-03-27 NOTE — Telephone Encounter (Signed)
Spoke with patient to confirm that pen needles were sent to [facility], confirmed patient picked up sample from 03/25/2022 for patient.   Orlando Penner, CPP, PharmD Clinical Pharmacist Practitioner [facility] [phone]

## 2022-04-03 ENCOUNTER — Telehealth: Payer: Self-pay

## 2022-04-03 NOTE — Chronic Care Management (AMB) (Signed)
04/01/2022- Faxed re-enrollment application to [employer] patient assistance program for Tresiba 200 u/ml and Novofine needles.  Pattricia Boss, [place] Pharmacist Assistant [phone]

## 2022-04-08 ENCOUNTER — Encounter: Payer: Self-pay | Admitting: Podiatry

## 2022-04-08 ENCOUNTER — Ambulatory Visit (INDEPENDENT_AMBULATORY_CARE_PROVIDER_SITE_OTHER): Payer: Medicare Other | Admitting: Podiatry

## 2022-04-08 DIAGNOSIS — L84 Corns and callosities: Secondary | ICD-10-CM

## 2022-04-08 DIAGNOSIS — Q828 Other specified congenital malformations of skin: Secondary | ICD-10-CM

## 2022-04-08 DIAGNOSIS — B351 Tinea unguium: Secondary | ICD-10-CM | POA: Diagnosis not present

## 2022-04-08 DIAGNOSIS — M79674 Pain in right toe(s): Secondary | ICD-10-CM

## 2022-04-08 DIAGNOSIS — N183 Chronic kidney disease, stage 3 unspecified: Secondary | ICD-10-CM | POA: Diagnosis not present

## 2022-04-08 DIAGNOSIS — E1122 Type 2 diabetes mellitus with diabetic chronic kidney disease: Secondary | ICD-10-CM

## 2022-04-08 DIAGNOSIS — M79675 Pain in left toe(s): Secondary | ICD-10-CM

## 2022-04-12 NOTE — Progress Notes (Signed)
  Subjective:  Patient ID: Alec Solis, male    DOB: Feb 14, 1933,  MRN: [id number]  Alec Solis presents to clinic today for at risk foot care. Pt has h/o NIDDM with chronic kidney disease and corn(s)  left lower extremity, porokeratotic lesion(s) right lower extremity and painful mycotic nails. Painful toenails interfere with ambulation. Aggravating factors include wearing enclosed shoe gear. Pain is relieved with periodic professional debridement. Painful corns and porokeratotic lesion(s) aggravated when weightbearing with and without shoegear. Pain is relieved with periodic professional debridement.  Patient states blood glucose was 128 mg/dl today.  Last A1c was 7.1%.  New problem(s): None.   PCP is [name], MD , and last visit was  October 30, 2021  No Known Allergies  Review of Systems: Negative except as noted in the HPI.  Objective: No changes noted in today's physical examination. Vascular Examination: CFT <3 seconds b/l. DP/PT pulses faintly palpable b/l. Skin temperature gradient warm to warm b/l. No ischemia or gangrene. No cyanosis or clubbing noted b/l.    Neurological Examination: Sensation grossly intact b/l with 10 gram monofilament. Vibratory sensation intact b/l.   Dermatological Examination: Pedal skin warm and supple b/l. Toenails 1-5 b/l thick, discolored, elongated with subungual debris and pain on dorsal palpation.  Hyperkeratotic lesion(s) medial IPJ of L 2nd toe and lateral IPJ of left great toe.  No erythema, no edema, no drainage, no fluctuance. Porokeratotic lesion(s) plantar IPJ of right great toe and submet head 4 left foot. No erythema, no edema, no drainage, no fluctuance.  Musculoskeletal Examination: Muscle strength 5/5 to b/l LE. HAV with bunion bilaterally and hammertoes 2-5 b/l.  Radiographs: None  Last A1c:      Latest Ref Rng & Units 10/30/2021    9:16 AM 07/29/2021    9:04 AM 05/07/2021   12:00 AM  Hemoglobin A1C  Hemoglobin-A1c 4.8 - 5.6  % 6.3  6.2  9.6    Assessment/Plan: 1. Pain due to onychomycosis of toenails of both feet   2. Porokeratosis   3. Corns   4. Diabetes mellitus with stage 3 chronic kidney disease ([place])   -Examined patient. -Continue diabetic foot care principles: inspect feet daily, monitor glucose as recommended by PCP and/or Endocrinologist, and follow prescribed diet per PCP, Endocrinologist and/or dietician. -Toenails 1-5 b/l were debrided in length and girth with sterile nail nippers and dremel without iatrogenic bleeding.  -Corn(s) medial IPJ of L 2nd toe and lateral IPJ of left great toe pared utilizing sterile scalpel blade without complication or incident. Total number debrided=2. -Porokeratotic lesion(s) plantar IPJ of right great toe and submet head 4 left foot pared and enucleated with sterile scalpel blade without incident. Total number of lesions debrided=2. -Patient/POA to call should there be question/concern in the interim.   Return in about 3 months (around 07/09/2022).  [name], [name]

## 2022-04-14 ENCOUNTER — Telehealth: Payer: Self-pay

## 2022-04-14 NOTE — Chronic Care Management (AMB) (Signed)
04-14-2022: Contacted [employer] to follow up on tresiba application. Application was received/ processed today and will ship out 10-14 days. Patient is aware of outcome and aware re enrollment starts 06-07-2022.  [place] Pharmacist Assistant [phone]

## 2022-04-28 ENCOUNTER — Other Ambulatory Visit: Payer: Self-pay | Admitting: Internal Medicine

## 2022-05-04 ENCOUNTER — Other Ambulatory Visit: Payer: Self-pay

## 2022-05-04 MED ORDER — ALLOPURINOL 100 MG PO TABS: 100.0000 mg | ORAL_TABLET | Freq: Two times a day (BID) | ORAL | 0 refills | Status: DC

## 2022-05-05 DIAGNOSIS — N189 Chronic kidney disease, unspecified: Secondary | ICD-10-CM | POA: Diagnosis not present

## 2022-05-05 DIAGNOSIS — I129 Hypertensive chronic kidney disease with stage 1 through stage 4 chronic kidney disease, or unspecified chronic kidney disease: Secondary | ICD-10-CM | POA: Diagnosis not present

## 2022-05-05 DIAGNOSIS — E1122 Type 2 diabetes mellitus with diabetic chronic kidney disease: Secondary | ICD-10-CM | POA: Diagnosis not present

## 2022-05-05 DIAGNOSIS — D631 Anemia in chronic kidney disease: Secondary | ICD-10-CM | POA: Diagnosis not present

## 2022-05-05 DIAGNOSIS — N183 Chronic kidney disease, stage 3 unspecified: Secondary | ICD-10-CM | POA: Diagnosis not present

## 2022-05-05 DIAGNOSIS — M109 Gout, unspecified: Secondary | ICD-10-CM | POA: Diagnosis not present

## 2022-05-05 DIAGNOSIS — N2581 Secondary hyperparathyroidism of renal origin: Secondary | ICD-10-CM | POA: Diagnosis not present

## 2022-05-07 ENCOUNTER — Telehealth: Payer: Self-pay

## 2022-05-07 NOTE — Chronic Care Management (AMB) (Signed)
05-07-2022: LVM notifying patient that tresiba/needles are ready for pick up any day but Friday.  [place] Pharmacist Assistant [phone]

## 2022-05-13 ENCOUNTER — Telehealth: Payer: Self-pay

## 2022-05-13 NOTE — Telephone Encounter (Signed)
Called Mr. Moronta to inform him that his medication is here from patient assistance. Patient voiced understanding and said he would pick it up tomorrow.  Orlando Penner, CPP, PharmD Clinical Pharmacist Practitioner [facility] [phone]

## 2022-05-18 ENCOUNTER — Encounter: Payer: Self-pay | Admitting: Nurse Practitioner

## 2022-05-18 ENCOUNTER — Ambulatory Visit (INDEPENDENT_AMBULATORY_CARE_PROVIDER_SITE_OTHER): Payer: Medicare Other | Admitting: Nurse Practitioner

## 2022-05-18 VITALS — BP 132/80 | HR 80 | Temp 98.2°F | Wt 180.2 lb

## 2022-05-18 DIAGNOSIS — Z794 Long term (current) use of insulin: Secondary | ICD-10-CM

## 2022-05-18 DIAGNOSIS — E1122 Type 2 diabetes mellitus with diabetic chronic kidney disease: Secondary | ICD-10-CM

## 2022-05-18 DIAGNOSIS — Z87891 Personal history of nicotine dependence: Secondary | ICD-10-CM

## 2022-05-18 DIAGNOSIS — Z23 Encounter for immunization: Secondary | ICD-10-CM

## 2022-05-18 DIAGNOSIS — N1832 Chronic kidney disease, stage 3b: Secondary | ICD-10-CM | POA: Diagnosis not present

## 2022-05-18 DIAGNOSIS — I129 Hypertensive chronic kidney disease with stage 1 through stage 4 chronic kidney disease, or unspecified chronic kidney disease: Secondary | ICD-10-CM | POA: Diagnosis not present

## 2022-05-18 DIAGNOSIS — E782 Mixed hyperlipidemia: Secondary | ICD-10-CM | POA: Diagnosis not present

## 2022-05-18 DIAGNOSIS — R911 Solitary pulmonary nodule: Secondary | ICD-10-CM

## 2022-05-18 MED ORDER — OMEPRAZOLE 40 MG PO CPDR: 40.0000 mg | DELAYED_RELEASE_CAPSULE | Freq: Every day | ORAL | 1 refills | Status: DC

## 2022-05-18 MED ORDER — FLUTICASONE PROPIONATE 50 MCG/ACT NA SUSP: 2.0000 | Freq: Every day | NASAL | 2 refills | Status: AC

## 2022-05-18 MED ORDER — AMLODIPINE BESYLATE 5 MG PO TABS
ORAL_TABLET | ORAL | 1 refills | Status: DC
Start: 2022-05-18 — End: 2023-02-08

## 2022-05-18 MED ORDER — ALLOPURINOL 100 MG PO TABS
100.0000 mg | ORAL_TABLET | Freq: Two times a day (BID) | ORAL | 0 refills | Status: DC
Start: 2022-05-18 — End: 2022-09-17

## 2022-05-18 MED ORDER — ROSUVASTATIN CALCIUM 20 MG PO TABS: ORAL_TABLET | ORAL | 1 refills | Status: DC

## 2022-05-18 NOTE — Progress Notes (Unsigned)
I,Tianna Badgett,acting as a Education administrator for [employer], FNP.,have documented all relevant documentation on the behalf of [name], FNP,as directed by  [name], FNP while in the presence of [name], [place].  Subjective:     Patient ID: Alec Solis , male    DOB: 1933-03-25 , 88 y.o.   MRN: [id number]   Chief Complaint  Patient presents with   Diabetes   Hypertension    HPI  Patient presents today for a f/u on his diabetes and bp. He reports compliance with his meds. Has been having congestion in the morning.   Diabetes He presents for his follow-up diabetic visit. He has type 2 diabetes mellitus. His disease course has been improving. There are no hypoglycemic associated symptoms. Pertinent negatives for diabetes include no blurred vision. There are no hypoglycemic complications. Symptoms are improving. There are no diabetic complications. Risk factors for coronary artery disease include sedentary lifestyle, male sex, diabetes mellitus and hypertension. Current diabetic treatment includes oral agent (dual therapy). He is compliant with treatment most of the time. He is following a generally healthy diet. His home blood glucose trend is decreasing steadily. (Blood sugar ranging 78-162 (had eaten a good amount of foods). )  Hypertension Pertinent negatives include no blurred vision.     Past Medical History:  Diagnosis Date   Anemia    Anxiety    Arthritis    Cataract    Chronic kidney disease    Depression    Diabetes mellitus without complication (HCC)    type II    GERD (gastroesophageal reflux disease)    Gout    Heart murmur    Hypertension    Myocardial infarction ([place])    minor Mi- years ago -    Varicose veins of left lower extremity      Family History  Problem Relation Age of Onset   Breast cancer Mother    Stomach cancer Mother    Diabetes Mellitus II Maternal Aunt      Current Outpatient Medications:    acetaminophen (TYLENOL) 650 MG CR tablet,  Take 650 mg by mouth every 8 (eight) hours as needed for pain. , Disp: , Rfl:    aspirin 81 MG tablet, Take 81 mg by mouth daily. , Disp: , Rfl:    B-D ULTRAFINE III SHORT PEN 31G X 8 MM MISC, USE AS DIRECTED FOUR TIMES DAILY, Disp: 100 each, Rfl: 3   fluticasone (FLONASE) 50 MCG/ACT nasal spray, Place 2 sprays into both nostrils daily., Disp: 16 g, Rfl: 2   allopurinol (ZYLOPRIM) 100 MG tablet, Take 1 tablet (100 mg total) by mouth 2 (two) times daily., Disp: 180 tablet, Rfl: 0   amLODipine (NORVASC) 5 MG tablet, TAKE 1 TABLET(5 MG) BY MOUTH DAILY, Disp: 90 tablet, Rfl: 1   calcitRIOL (ROCALTROL) 0.25 MCG capsule, Take 0.25 mcg by mouth every other day. , Disp: , Rfl:    carboxymethylcellulose (REFRESH PLUS) 0.5 % SOLN, Place 1 drop into both eyes 3 (three) times daily as needed (Watery eyes). , Disp: , Rfl:    carvedilol (COREG) 12.5 MG tablet, Take 12.5 mg by mouth 2 (two) times daily with a meal., Disp: , Rfl:    ferrous sulfate 325 (65 FE) MG tablet, Take 325 mg by mouth daily with breakfast., Disp: , Rfl:    furosemide (LASIX) 40 MG tablet, Take 40 mg by mouth daily as needed for fluid or edema. , Disp: , Rfl:    glucose blood (TRUE METRIX  BLOOD GLUCOSE TEST) test strip, Use as directed to check blood sugars 3 times per day dx: e11.65, Disp: 300 each, Rfl: 5   insulin degludec (TRESIBA FLEXTOUCH) 200 UNIT/ML FlexTouch Pen, Inject 18 Units into the skin daily., Disp: 27 mL, Rfl: 1   Insulin Pen Needle 32G X 8 MM MISC, Use as directed, Disp: 100 each, Rfl: 2   naproxen sodium (ALEVE) 220 MG tablet, Take 220 mg by mouth daily as needed (Body ache)., Disp: , Rfl:    Omega-3 Fatty Acids (FISH OIL OMEGA-3 PO), Take 1,200 mg by mouth daily. , Disp: , Rfl:    omeprazole (PRILOSEC) 40 MG capsule, Take 1 capsule (40 mg total) by mouth daily., Disp: 90 capsule, Rfl: 1   ondansetron (ZOFRAN) 4 MG tablet, TAKE 1 TABLET(4 MG) BY MOUTH DAILY AS NEEDED FOR NAUSEA OR VOMITING, Disp: 30 tablet, Rfl: 1    rosuvastatin (CRESTOR) 20 MG tablet, TAKE 1 TABLET BY MOUTH DAILY ON MONDAY TO FRIDAY, Disp: 75 tablet, Rfl: 1   sodium bicarbonate 650 MG tablet, Take 1,300 mg by mouth 2 (two) times daily., Disp: , Rfl:    TRUEplus Lancets 30G MISC, USE AS DIRECTED TO TEST BLOOD SUGAR THREE TIMES DAILY, Disp: 300 each, Rfl: 11   vitamin B-12 (CYANOCOBALAMIN) 1000 MCG tablet, Take 1,000 mcg by mouth daily., Disp: , Rfl:    No Known Allergies   Review of Systems  Constitutional: Negative.   Eyes:  Negative for blurred vision.  Respiratory: Negative.         Increased mucous in the mornings  Cardiovascular: Negative.   Gastrointestinal: Negative.   Neurological: Negative.   Psychiatric/Behavioral: Negative.       Today's Vitals   05/18/22 0855  BP: 132/80  Pulse: 80  Temp: 98.2 F (36.8 C)  TempSrc: Oral  Weight: 180 lb 3.2 oz (81.7 kg)   Body mass index is 25.86 kg/m.   Objective:  Physical Exam Vitals reviewed.  Constitutional:      General: He is not in acute distress.    Appearance: Normal appearance.  Cardiovascular:     Rate and Rhythm: Normal rate and regular rhythm.     Pulses: Normal pulses.     Heart sounds: Normal heart sounds. No murmur heard. Pulmonary:     Effort: Pulmonary effort is normal. No respiratory distress.     Breath sounds: Normal breath sounds. No wheezing.  Musculoskeletal:     Right lower leg: No edema.     Left lower leg: No edema (trace edema).  Skin:    General: Skin is warm and dry.     Capillary Refill: Capillary refill takes less than 2 seconds.  Neurological:     General: No focal deficit present.     Mental Status: He is alert and oriented to person, place, and time.     Cranial Nerves: No cranial nerve deficit.     Motor: No weakness.  Psychiatric:        Mood and Affect: Mood normal.        Behavior: Behavior normal.        Thought Content: Thought content normal.        Judgment: Judgment normal.         Assessment And Plan:     1.  Hypertensive nephropathy Comments: Blood pressure is fairly controlled, continue current medications.   2. Type 2 diabetes mellitus with stage 3b chronic kidney disease, with long-term current use of insulin (HCC) Comments: HgbA1c was  slightly increased at last visit, will check levels today. - Hemoglobin A1c  3. Mixed hyperlipidemia Comments: Stable, continue current medications.  - Lipid panel - rosuvastatin (CRESTOR) 20 MG tablet; TAKE 1 TABLET BY MOUTH DAILY ON MONDAY TO FRIDAY  Dispense: 75 tablet; Refill: 1  4. Need for influenza vaccination Influenza vaccine administered Encouraged to take Tylenol as needed for fever or muscle aches. - Flu Vaccine QUAD High Dose(Fluad)  5. History of tobacco abuse He had a 25 Pack per year smoking history    Patient was given opportunity to ask questions. Patient verbalized understanding of the plan and was able to repeat key elements of the plan. All questions were answered to their satisfaction.  [name], FNP   I, [name], FNP, have reviewed all documentation for this visit. The documentation on 05/18/22 for the exam, diagnosis, procedures, and orders are all accurate and complete.   IF YOU HAVE BEEN REFERRED TO A SPECIALIST, IT MAY TAKE 1-2 WEEKS TO SCHEDULE/PROCESS THE REFERRAL. IF YOU HAVE NOT HEARD FROM US/SPECIALIST IN TWO WEEKS, PLEASE GIVE [place] A CALL AT [phone] X [phone].   THE PATIENT IS ENCOURAGED TO PRACTICE SOCIAL DISTANCING DUE TO THE COVID-19 PANDEMIC.

## 2022-05-18 NOTE — Patient Instructions (Addendum)
Diabetes Mellitus and Exercise Exercising regularly is important for overall health, especially for people who have diabetes mellitus. Exercising is not only about losing weight. It has many other health benefits, such as increasing muscle strength and bone density and reducing body fat and stress. This leads to improved fitness, flexibility, and endurance, all of which result in better overall health. What are the benefits of exercise if I have diabetes? Exercise has many benefits for people with diabetes. They include: Helping to lower and control blood sugar (glucose). Helping the body to respond better to the hormone insulin by improving insulin sensitivity. Reducing how much insulin the body needs. Lowering the risk for heart disease by: Lowering "bad" cholesterol and triglyceride levels. Increasing "good" cholesterol levels. Lowering blood pressure. Lowering blood glucose levels. What is my activity plan? Your health care provider or certified diabetes educator can help you make a plan for the type and frequency of exercise that works for you. This is called your activity plan. Be sure to: Get at least 150 minutes of medium-intensity or high-intensity exercise each week. Exercises may include brisk walking, biking, or water aerobics. Do stretching and strengthening exercises, such as yoga or weight lifting, at least 2 times a week. Spread out your activity over at least 3 days of the week. Get some form of physical activity each day. Do not go more than 2 days in a row without some kind of physical activity. Avoid being inactive for more than 90 minutes at a time. Take frequent breaks to walk or stretch. Choose exercises or activities that you enjoy. Set realistic goals. Start slowly and gradually increase your exercise intensity over time. How do I manage my diabetes during exercise?  Monitor your blood glucose Check your blood glucose before and after exercising. If your blood  glucose is: 240 mg/dL (13.3 mmol/L) or higher before you exercise, check your urine for ketones. These are chemicals created by the liver. If you have ketones in your urine, do not exercise until your blood glucose returns to normal. 100 mg/dL (5.6 mmol/L) or lower, eat a snack containing 15-20 grams of carbohydrate. Check your blood glucose 15 minutes after the snack to make sure that your glucose level is above 100 mg/dL (5.6 mmol/L) before you start your exercise. Know the symptoms of low blood glucose (hypoglycemia) and how to treat it. Your risk for hypoglycemia increases during and after exercise. Follow these tips and your health care provider's instructions Keep a carbohydrate snack that is fast-acting for use before, during, and after exercise to help prevent or treat hypoglycemia. Avoid injecting insulin into areas of the body that are going to be exercised. For example, avoid injecting insulin into: Your arms, when you are about to play tennis. Your legs, when you are about to go jogging. Keep records of your exercise habits. Doing this can help you and your health care provider adjust your diabetes management plan as needed. Write down: Food that you eat before and after you exercise. Blood glucose levels before and after you exercise. The type and amount of exercise you have done. Work with your health care provider when you start a new exercise or activity. He or she may need to: Make sure that the activity is safe for you. Adjust your insulin, other medicines, and food that you eat. Drink plenty of water while you exercise. This prevents loss of water (dehydration) and problems caused by a lot of heat in the body (heat stroke). Where to find more  information American Diabetes Association: www.diabetes.org Summary Exercising regularly is important for overall health, especially for people who have diabetes mellitus. Exercising has many health benefits. It increases muscle strength  and bone density and reduces body fat and stress. It also lowers and controls blood glucose. Your health care provider or certified diabetes educator can help you make an activity plan for the type and frequency of exercise that works for you. Work with your health care provider to make sure any new activity is safe for you. Also work with your health care provider to adjust your insulin, other medicines, and the food you eat. This information is not intended to replace advice given to you by your health care provider. Make sure you discuss any questions you have with your health care provider. Document Revised: 05/08/2019 Document Reviewed: 05/08/2019 Elsevier Patient Education  [place].  Influenza (Flu) Vaccine (Inactivated or Recombinant): What You Need to Know 1. Why get vaccinated? Influenza vaccine can prevent influenza (flu). Flu is a contagious disease that spreads around the [place] every year, usually between October and May. Anyone can get the flu, but it is more dangerous for some people. Infants and young children, people 35 years and older, pregnant people, and people with certain health conditions or a weakened immune system are at greatest risk of flu complications. Pneumonia, bronchitis, sinus infections, and ear infections are examples of flu-related complications. If you have a medical condition, such as heart disease, cancer, or diabetes, flu can make it worse. Flu can cause fever and chills, sore throat, muscle aches, fatigue, cough, headache, and runny or stuffy nose. Some people may have vomiting and diarrhea, though this is more common in children than adults. In an average year, thousands of people in the [place] die from flu, and many more are hospitalized. Flu vaccine prevents millions of illnesses and flu-related visits to the doctor each year. 2. Influenza vaccines CDC recommends everyone 6 months and older get vaccinated every flu season. Children 6  months through 35 years of age may need 2 doses during a single flu season. Everyone else needs only 1 dose each flu season. It takes about 2 weeks for protection to develop after vaccination. There are many flu viruses, and they are always changing. Each year a new flu vaccine is made to protect against the influenza viruses believed to be likely to cause disease in the upcoming flu season. Even when the vaccine doesn't exactly match these viruses, it may still provide some protection. Influenza vaccine does not cause flu. Influenza vaccine may be given at the same time as other vaccines. 3. Talk with your health care provider Tell your vaccination provider if the person getting the vaccine: Has had an allergic reaction after a previous dose of influenza vaccine, or has any severe, life-threatening allergies Has ever had Guillain-Barr Syndrome (also called "GBS") In some cases, your health care provider may decide to postpone influenza vaccination until a future visit. Influenza vaccine can be administered at any time during pregnancy. People who are or will be pregnant during influenza season should receive inactivated influenza vaccine. People with minor illnesses, such as a cold, may be vaccinated. People who are moderately or severely ill should usually wait until they recover before getting influenza vaccine. Your health care provider can give you more information. 4. Risks of a vaccine reaction Soreness, redness, and swelling where the shot is given, fever, muscle aches, and headache can happen after influenza vaccination. There may be a  very small increased risk of Guillain-Barr Syndrome (GBS) after inactivated influenza vaccine (the flu shot). Young children who get the flu shot along with pneumococcal vaccine (PCV13) and/or DTaP vaccine at the same time might be slightly more likely to have a seizure caused by fever. Tell your health care provider if a child who is getting flu vaccine has  ever had a seizure. People sometimes faint after medical procedures, including vaccination. Tell your provider if you feel dizzy or have vision changes or ringing in the ears. As with any medicine, there is a very remote chance of a vaccine causing a severe allergic reaction, other serious injury, or death. 5. What if there is a serious problem? An allergic reaction could occur after the vaccinated person leaves the clinic. If you see signs of a severe allergic reaction (hives, swelling of the face and throat, difficulty breathing, a fast heartbeat, dizziness, or weakness), call 9-1-1 and get the person to the nearest hospital. For other signs that concern you, call your health care provider. Adverse reactions should be reported to the Vaccine Adverse Event Reporting System (VAERS). Your health care provider will usually file this report, or you can do it yourself. Visit the VAERS website at www.vaers.SamedayNews.es or call (703)067-4109. VAERS is only for reporting reactions, and VAERS staff members do not give medical advice. 6. The National Vaccine Injury Compensation Program The [employer] Vaccine Injury Compensation Program (VICP) is a federal program that was created to compensate people who may have been injured by certain vaccines. Claims regarding alleged injury or death due to vaccination have a time limit for filing, which may be as short as two years. Visit the VICP website at GoldCloset.com.ee or call 867-496-1488 to learn about the program and about filing a claim. 7. How can I learn more? Ask your health care provider. Call your local or state health department. Visit the website of the Food and Drug Administration (FDA) for vaccine package inserts and additional information at TraderRating.uy. Contact the Centers for Disease Control and Prevention (CDC): Call 9720911186 (1-800-CDC-INFO) or Visit CDC's website at https://gibson.com/. Source: CDC  Vaccine Information Statement Inactivated Influenza Vaccine (03/29/2020) This same material is available at http://www.wolf.info/ for no charge. This information is not intended to replace advice given to you by your health care provider. Make sure you discuss any questions you have with your health care provider. Document Revised: 07/09/2021 Document Reviewed: 05/01/2021 Elsevier Patient Education  [place].

## 2022-05-19 LAB — LIPID PANEL
Chol/HDL Ratio: 4.7 ratio (ref 0.0–5.0)
Cholesterol, Total: 128 mg/dL (ref 100–199)
HDL: 27 mg/dL — ABNORMAL LOW (ref >39)
LDL Chol Calc (NIH): 71 mg/dL (ref 0–99)
Triglycerides: 174 mg/dL — ABNORMAL HIGH (ref 0–149)
VLDL Cholesterol Cal: 30 mg/dL (ref 5–40)

## 2022-05-19 LAB — HEMOGLOBIN A1C
Est. average glucose Bld gHb Est-mCnc: 160 mg/dL
Hgb A1c MFr Bld: 7.2 % — ABNORMAL HIGH (ref 4.8–5.6)

## 2022-05-21 ENCOUNTER — Encounter: Payer: Self-pay | Admitting: Nurse Practitioner

## 2022-06-19 ENCOUNTER — Ambulatory Visit (INDEPENDENT_AMBULATORY_CARE_PROVIDER_SITE_OTHER): Payer: Medicare Other

## 2022-06-19 DIAGNOSIS — Z23 Encounter for immunization: Secondary | ICD-10-CM | POA: Diagnosis not present

## 2022-06-19 DIAGNOSIS — E782 Mixed hyperlipidemia: Secondary | ICD-10-CM

## 2022-06-19 DIAGNOSIS — Z794 Long term (current) use of insulin: Secondary | ICD-10-CM

## 2022-06-19 NOTE — Progress Notes (Unsigned)
Chronic Care Management Pharmacy Note  06/23/2022 Name:  Alec Solis MRN:  [id number] DOB:  1933/02/12  Summary: Patient reports that he is doing well, and has plenty of medication   Recommendations/Changes made from today's visit: Recommend patient receive his COVID -19 Booster  Recommend patient receive his shingrix vaccine  Plan: Patient is going to get his COVID-19 booster at [facility] with PCP to discuss decreasing his current [place] dose    Subjective: Alec Solis is an 88 y.o. year old male who is a primary patient of [name], MD.  The CCM team was consulted for assistance with disease management and care coordination needs.    Engaged with patient by telephone for follow up visit in response to provider referral for pharmacy case management and/or care coordination services. Patient reports that he came back from [place] yesterday. His dentures broke, and he needs them repaired.   Consent to Services:  The patient was given information about Chronic Care Management services, agreed to services, and gave verbal consent prior to initiation of services.  Please see initial visit note for detailed documentation.   Patient Care Team: [name], MD as PCP - General (Internal Medicine) Mayford Knife, [facility] (Pharmacist) 05/18/2022 - PCP OV  Recent consult visits: 04/08/2022 - [facility] visits: None in previous 6 months   Objective:  Lab Results  Component Value Date   CREATININE 2.03 (H) 10/30/2021   BUN 22 10/30/2021   EGFR 31 (L) 10/30/2021   GFRNONAA 27 (L) 10/01/2020   GFRAA 31 (L) 10/01/2020   NA 144 10/30/2021   K 4.5 10/30/2021   CALCIUM 9.8 10/30/2021   CO2 22 10/30/2021   GLUCOSE 115 (H) 10/30/2021    Lab Results  Component Value Date/Time   HGBA1C 7.2 (H) 05/18/2022 09:53 AM   HGBA1C 6.3 (H) 10/30/2021 09:16 AM   HGBA1C 9.6 05/07/2021 12:00 AM   MICROALBUR 30 07/29/2021 09:47 AM   MICROALBUR 80 02/20/2020 11:46 AM     Last diabetic Eye exam:  Lab Results  Component Value Date/Time   HMDIABEYEEXA No Retinopathy 10/27/2021 12:00 AM    Last diabetic Foot exam: No results found for: "HMDIABFOOTEX"   Lab Results  Component Value Date   CHOL 128 05/18/2022   HDL 27 (L) 05/18/2022   LDLCALC 71 05/18/2022   TRIG 174 (H) 05/18/2022   CHOLHDL 4.7 05/18/2022       Latest Ref Rng & Units 10/30/2021    9:16 AM 07/29/2021    9:04 AM 05/07/2021   12:00 AM  Hepatic Function  Total Protein 6.0 - 8.5 g/dL 7.8  7.7    Albumin 3.6 - 4.6 g/dL 4.4  4.7  4.3   AST 0 - 40 IU/L 16  18    ALT 0 - 44 IU/L 9  13    Alk Phosphatase 44 - 121 IU/L 142  145    Total Bilirubin 0.0 - 1.2 mg/dL 0.3  0.3      No results found for: "TSH", "FREET4"     Latest Ref Rng & Units 10/30/2021    9:16 AM 01/09/2021   10:14 AM 05/21/2020   10:38 AM  CBC  WBC 3.4 - 10.8 x10E3/uL 6.1  5.7    Hemoglobin 13.0 - 17.7 g/dL 9.2  9.2  11.0   Hematocrit 37.5 - 51.0 % 31.1  31.5    Platelets 150 - 450 x10E3/uL 204  263      No results found for: "  VD25OH"  Clinical ASCVD: No  The ASCVD Risk score (Arnett DK, et al., 2019) failed to calculate for the following reasons:   The 2019 ASCVD risk score is only valid for ages 13 to 91       05/18/2022    8:54 AM 03/11/2022    2:41 PM 10/30/2021    8:57 AM  Depression screen PHQ 2/9  Decreased Interest 0 0 0  Down, Depressed, Hopeless 0 0 0  PHQ - 2 Score 0 0 0  Altered sleeping 0    Tired, decreased energy 0    Change in appetite 0    Feeling bad or failure about yourself  0    Trouble concentrating 0    Moving slowly or fidgety/restless 0    Suicidal thoughts 0    PHQ-9 Score 0       Social History   Tobacco Use  Smoking Status Former   Packs/day: 0.50   Years: 50.00   Total pack years: 25.00   Types: Cigarettes   Start date: 08/24/1952   Quit date: 08/24/2014   Years since quitting: 7.8  Smokeless Tobacco Never   BP Readings from Last 3 Encounters:  05/18/22 132/80   03/11/22 114/60  11/21/21 140/83   Pulse Readings from Last 3 Encounters:  05/18/22 80  03/11/22 68  11/21/21 90   Wt Readings from Last 3 Encounters:  05/18/22 180 lb 3.2 oz (81.7 kg)  03/11/22 178 lb 12.8 oz (81.1 kg)  11/21/21 183 lb 3.2 oz (83.1 kg)   BMI Readings from Last 3 Encounters:  05/18/22 25.86 kg/m  03/11/22 25.66 kg/m  11/21/21 25.55 kg/m    Assessment/Interventions: Review of patient past medical history, allergies, medications, health status, including review of consultants reports, laboratory and other test data, was performed as part of comprehensive evaluation and provision of chronic care management services.   SDOH:  (Social Determinants of Health) assessments and interventions performed: No SDOH Interventions    Flowsheet Row Chronic Care Management from 06/17/2021 in [facility] from 09/04/2020 in [facility] Office Visit from 07/01/2020 in [facility] Chronic Care Management from 05/08/2020 in [facility] Clinical Support from 05/23/2019 in [facility]  SDOH Interventions       Depression Interventions/Treatment  Patient refuses Treatment --  Aletha Halim query this for now] Patient refuses Treatment -- PHQ2-9 Score <4 Follow-up Not Indicated  Financial Strain Interventions -- -- -- Other (Comment)  [Will continue to assist with [name] patient assistance refills as needed] --      SDOH Screenings   Food Insecurity: No Food Insecurity (03/11/2022)  Transportation Needs: No Transportation Needs (03/11/2022)  Depression (PHQ2-9): Low Risk  (05/18/2022)  Financial Resource Strain: Low Risk  (03/11/2022)  Physical Activity: Inactive (03/11/2022)  Stress: No Stress Concern Present (03/11/2022)  Tobacco Use: Medium Risk (05/21/2022)    [place]  No Known Allergies  Medications Reviewed Today     Reviewed by [name],  FNP (Family Nurse Practitioner) on 05/18/22 at [phone]  Med List Status: <None>   Medication Order Taking? Sig Documenting Provider Last Dose Status Informant  acetaminophen (TYLENOL) 650 MG CR tablet [id number] Yes Take 650 mg by mouth every 8 (eight) hours as needed for pain.  [provider] Taking Active Self  allopurinol (ZYLOPRIM) 100 MG tablet [id number]  Take 1 tablet (100 mg total) by mouth 2 (two) times daily. [name], FNP  Active  amLODipine (NORVASC) 5 MG tablet [id number]  TAKE 1 TABLET(5 MG) BY MOUTH DAILY [name], FNP  Active   aspirin 81 MG tablet [id number] Yes Take 81 mg by mouth daily.  [provider] Taking Active Self  B-D ULTRAFINE III SHORT PEN 31G X 8 MM MISC [id number] Yes USE AS DIRECTED FOUR TIMES DAILY [name], MD Taking Active Self  calcitRIOL (ROCALTROL) 0.25 MCG capsule [id number]  Take 0.25 mcg by mouth every other day.  [provider]  Active Self  carboxymethylcellulose (REFRESH PLUS) 0.5 % SOLN [id number]  Place 1 drop into both eyes 3 (three) times daily as needed (Watery eyes).  [provider]  Active Self  carvedilol (COREG) 12.5 MG tablet [id number]  Take 12.5 mg by mouth 2 (two) times daily with a meal. [provider]  Active Self  ferrous sulfate 325 (65 FE) MG tablet [id number]  Take 325 mg by mouth daily with breakfast. [provider]  Active Self  fluticasone (FLONASE) 50 MCG/ACT nasal spray [id number] Yes Place 2 sprays into both nostrils daily. [name], FNP  Active   furosemide (LASIX) 40 MG tablet [id number]  Take 40 mg by mouth daily as needed for fluid or edema.  [provider]  Active Self           Med Note Gentry Roch   Wed Mar 27, 2020 10:27 AM)    glucose blood (TRUE METRIX BLOOD GLUCOSE TEST) test strip [id number]  Use as directed to check blood sugars 3 times per day dx: e11.65 [name], MD  Active   insulin degludec (TRESIBA FLEXTOUCH) 200 UNIT/ML  FlexTouch Pen [id number]  Inject 18 Units into the skin daily. [name], MD  Active   Insulin Pen Needle 32G X 8 MM MISC [id number]  Use as directed [name], MD  Active   naproxen sodium (ALEVE) 220 MG tablet [id number]  Take 220 mg by mouth daily as needed (Body ache). [provider]  Active   Omega-3 Fatty Acids (FISH OIL OMEGA-3 PO) [id number]  Take 1,200 mg by mouth daily.  [provider]  Active Self           Med Note Gentry Roch   Wed Mar 27, 2020 10:31 AM)    omeprazole (PRILOSEC) 40 MG capsule [id number]  Take 1 capsule (40 mg total) by mouth daily. [name], FNP  Active   ondansetron (ZOFRAN) 4 MG tablet [id number]  TAKE 1 TABLET(4 MG) BY MOUTH DAILY AS NEEDED FOR NAUSEA OR VOMITING [name], MD  Active   rosuvastatin (CRESTOR) 20 MG tablet [id number]  TAKE 1 TABLET BY MOUTH DAILY ON MONDAY TO Ottie Glazier, FNP  Active   sodium bicarbonate 650 MG tablet [id number]  Take 1,300 mg by mouth 2 (two) times daily. [provider]  Active Self  TRUEplus Lancets 30G MISC [id number]  USE AS DIRECTED TO TEST BLOOD SUGAR THREE TIMES DAILY [name], MD  Active   vitamin B-12 (CYANOCOBALAMIN) 1000 MCG tablet [id number]  Take 1,000 mcg by mouth daily. [provider]  Active Self            Patient Active Problem List   Diagnosis Date Noted   PVC (premature ventricular contraction) 11/21/2021   Precordial chest pain 11/21/2021   Abnormal feces 03/20/2021   Abnormal weight loss 03/20/2021   Diarrhea 03/20/2021   Diverticular disease of colon 03/20/2021   Dysphagia 03/20/2021   Epigastric pain 03/20/2021   Hemorrhage of rectum and  anus 03/20/2021   Iron deficiency anemia 03/20/2021   Nausea and vomiting 03/20/2021   Black stools 03/06/2020   Gastroesophageal reflux disease without esophagitis 03/06/2020   Paget's disease of bone 10/23/2018   Acute renal failure (ARF) ([place]) 06/11/2016   Hyperglycemia 06/11/2016    Deficiency anemia 12/09/2011   Type 2 diabetes mellitus with stage 3b chronic kidney disease, with long-term current use of insulin ([place]) 12/09/2011   Renal insufficiency 12/09/2011    Immunization History  Administered Date(s) Administered   Fluad Quad(high Dose 65+) 05/18/2022   Influenza, High Dose Seasonal PF 05/25/2018, 04/19/2019   Influenza-Unspecified 05/24/2013, 05/31/2018, 05/07/2021   PFIZER(Purple Top)SARS-COV-2 Vaccination 10/23/2019, 11/15/2019, 09/12/2020   PNEUMOCOCCAL CONJUGATE-20 10/30/2021   Pfizer Covid-19 Vaccine Bivalent Booster 45yr & up 05/29/2021   Pneumococcal Polysaccharide-23 05/24/2013, 05/24/2017   Tdap 05/22/2013    Conditions to be addressed/monitored:  Hyperlipidemia and Diabetes  [place] Updates made by PMayford Knife RRondosince 06/23/2022 12:00 AM     Problem: DM II , HLD   Priority: High     Long-Range Goal: Disease Management   Recent Progress: On track  Priority: High  Note:   Current Barriers:  Unable to independently afford treatment regimen Unable to independently monitor therapeutic efficacy  Pharmacist Clinical Goal(s):  Patient will verbalize ability to afford treatment regimen achieve adherence to monitoring guidelines and medication adherence to achieve therapeutic efficacy through collaboration with PharmD and provider.   Interventions: 1:1 collaboration with SGlendale Chard MD regarding development and update of comprehensive plan of care as evidenced by provider attestation and co-signature Inter-disciplinary care team collaboration (see longitudinal plan of care) Comprehensive medication review performed; medication list updated in electronic medical record   Diabetes (A1c goal <8%) -Controlled -Current medications: Tresiba 200 UNIT/ml - inject 18 units into the skin daily Appropriate, Effective, Safe, Accessible -Medications previously tried: FAbigail Buttshim very sick, Ozempic -  extreme nausea -Current home glucose readings fasting glucose: 10/16-110, 135,127,10/19-83,148, 10/20 -78, 97, 10/21-99,138, 10/22-74,105, 10/27-89, 10/25- 69 post prandial glucose: 10/16- 135, 10/19 -148, 10/21-138 -Denies hypoglycemic/hyperglycemic symptoms -Current meal patterns: he is currently eating two meals per day and reports he rarely has an appetite lunch: frank and bowl of soup   dinner: [employer] - Chicken&noodles  drinks: he is mostly drinking water and juice -Educated on A1c and blood sugar goals; Prevention and management of hypoglycemic episodes; -Discussed trying Jardiance and cutting back on insulin since his A1c is well controlled and he has had some low blood sugars. At this time he does not want to change his medications.  -Counseled to check feet daily and get yearly eye exams.  -Patient does have glucose tablets and is able to explain how to use them -Collaborated with PCP to possibly decrease current Tresiba dose due to low BS.    Hyperlipidemia: (LDL goal < 70) -Controlled -Current treatment: Rosuvastatin 20 mg tablet once per day Appropriate, Effective, Safe, Accessible -Current dietary patterns: he is limiting fried and fatty foods. He is eating mostly baked foods.  -Educated on Cholesterol goals;  Benefits of statin for ASCVD risk reduction; Importance of limiting foods high in cholesterol; -Recommended to continue current medication   Patient Goals/Self-Care Activities Patient will:  - take medications as prescribed as evidenced by patient report and record review  Follow Up Plan: The patient has been provided with contact information for the care management team and has been advised to call with any health related questions or concerns.  Medication Assistance:  [name] obtained through [employer] n[employer]isk medication assistance program.  Enrollment ends 07/2022  Compliance/Adherence/Medication fill history: Care Gaps: Shingrix Vaccine COVID-19  Vaccine  Star-Rating Drugs: Rosuvastatin 20 mg tablet   Patient's preferred pharmacy is:  [id number] [address], [place] [address] Phone: [phone] Fax: [phone]  Uses pill box? No - patient reports that he uses vials Pt endorses 95% compliance  We discussed: Benefits of medication synchronization, packaging and delivery as well as enhanced pharmacist oversight with [employer]. Patient decided to: Continue current medication management strategy  Care Plan and Follow Up Patient Decision:  Patient agrees to Care Plan and Follow-up.  Plan: The patient has been provided with contact information for the care management team and has been advised to call with any health related questions or concerns.   Orlando Penner, CPP, PharmD Clinical Pharmacist Practitioner [facility] [phone]

## 2022-06-23 DIAGNOSIS — Z794 Long term (current) use of insulin: Secondary | ICD-10-CM | POA: Diagnosis not present

## 2022-06-23 DIAGNOSIS — E785 Hyperlipidemia, unspecified: Secondary | ICD-10-CM | POA: Diagnosis not present

## 2022-06-23 DIAGNOSIS — E1169 Type 2 diabetes mellitus with other specified complication: Secondary | ICD-10-CM

## 2022-06-23 NOTE — Patient Instructions (Addendum)
Visit Information It was great speaking with you today!  Please let me know if you have any questions about our visit.   Goals Addressed             This Visit's Progress    Manage My Medicine       Timeframe:  Long-Range Goal Priority:  High Start Date:                             Expected End Date:                       Follow Up Date : 12/16/2022    In Progress:  - call for medicine refill 2 or 3 days before it runs out - call if I am sick and can't take my medicine - keep a list of all the medicines I take; vitamins and herbals too - use an alarm clock or phone to remind me to take my medicine    Why is this important?   These steps will help you keep on track with your medicines.   Notes:  -Please continue to log your BS readings everyday  -Paperwork for patient assistance renewal should arrive soon for next year         Patient Care Plan: [place] Plan     Problem Identified: DM II , HLD   Priority: High     Long-Range Goal: Disease Management   Recent Progress: On track  Priority: High  Note:   Current Barriers:  Unable to independently afford treatment regimen Unable to independently monitor therapeutic efficacy  Pharmacist Clinical Goal(s):  Patient will verbalize ability to afford treatment regimen achieve adherence to monitoring guidelines and medication adherence to achieve therapeutic efficacy through collaboration with PharmD and provider.   Interventions: 1:1 collaboration with [name], MD regarding development and update of comprehensive plan of care as evidenced by provider attestation and co-signature Inter-disciplinary care team collaboration (see longitudinal plan of care) Comprehensive medication review performed; medication list updated in electronic medical record   Diabetes (A1c goal <8%) -Controlled -Current medications: Tresiba 200 UNIT/ml - inject 18 units into the skin daily Appropriate, Effective, Safe,  Accessible -Medications previously tried: Abigail Butts him very sick, Ozempic - extreme nausea -Current home glucose readings fasting glucose: 10/16-110, 135,127,10/19-83,148, 10/20 -78, 97, 10/21-99,138, 10/22-74,105, 10/27-89, 10/25- 69 post prandial glucose: 10/16- 135, 10/19 -148, 10/21-138 -Denies hypoglycemic/hyperglycemic symptoms -Current meal patterns: he is currently eating two meals per day and reports he rarely has an appetite lunch: frank and bowl of soup   dinner: [employer] - Chicken&noodles  drinks: he is mostly drinking water and juice -Educated on A1c and blood sugar goals; Prevention and management of hypoglycemic episodes; -Discussed trying Jardiance and cutting back on insulin since his A1c is well controlled and he has had some low blood sugars. At this time he does not want to change his medications.  -Counseled to check feet daily and get yearly eye exams.  -Patient does have glucose tablets and is able to explain how to use them -Collaborated with PCP to possibly decrease current Tresiba dose due to low BS.    Hyperlipidemia: (LDL goal < 70) -Controlled -Current treatment: Rosuvastatin 20 mg tablet once per day Appropriate, Effective, Safe, Accessible -Current dietary patterns: he is limiting fried and fatty foods. He is eating mostly baked foods.  -Educated on Cholesterol goals;  Benefits of statin for ASCVD risk  reduction; Importance of limiting foods high in cholesterol; -Recommended to continue current medication   Patient Goals/Self-Care Activities Patient will:  - take medications as prescribed as evidenced by patient report and record review  Follow Up Plan: The patient has been provided with contact information for the care management team and has been advised to call with any health related questions or concerns.       Patient agreed to services and verbal consent obtained.   The patient verbalized understanding of instructions, educational  materials, and care plan provided today and agreed to receive a mailed copy of patient instructions, educational materials, and care plan.   Orlando Penner, PharmD Clinical Pharmacist [facility] [phone]

## 2022-07-23 DIAGNOSIS — E119 Type 2 diabetes mellitus without complications: Secondary | ICD-10-CM | POA: Diagnosis not present

## 2022-07-23 DIAGNOSIS — H353131 Nonexudative age-related macular degeneration, bilateral, early dry stage: Secondary | ICD-10-CM | POA: Diagnosis not present

## 2022-07-23 DIAGNOSIS — H04123 Dry eye syndrome of bilateral lacrimal glands: Secondary | ICD-10-CM | POA: Diagnosis not present

## 2022-07-23 DIAGNOSIS — H35371 Puckering of macula, right eye: Secondary | ICD-10-CM | POA: Diagnosis not present

## 2022-07-23 LAB — HM DIABETES EYE EXAM

## 2022-07-24 ENCOUNTER — Ambulatory Visit (INDEPENDENT_AMBULATORY_CARE_PROVIDER_SITE_OTHER): Payer: Medicare Other | Admitting: Podiatry

## 2022-07-24 ENCOUNTER — Encounter: Payer: Self-pay | Admitting: Podiatry

## 2022-07-24 VITALS — BP 142/80

## 2022-07-24 DIAGNOSIS — M79674 Pain in right toe(s): Secondary | ICD-10-CM

## 2022-07-24 DIAGNOSIS — E1122 Type 2 diabetes mellitus with diabetic chronic kidney disease: Secondary | ICD-10-CM

## 2022-07-24 DIAGNOSIS — L84 Corns and callosities: Secondary | ICD-10-CM | POA: Diagnosis not present

## 2022-07-24 DIAGNOSIS — B351 Tinea unguium: Secondary | ICD-10-CM | POA: Diagnosis not present

## 2022-07-24 DIAGNOSIS — N183 Chronic kidney disease, stage 3 unspecified: Secondary | ICD-10-CM

## 2022-07-24 DIAGNOSIS — M79675 Pain in left toe(s): Secondary | ICD-10-CM

## 2022-07-24 DIAGNOSIS — Q828 Other specified congenital malformations of skin: Secondary | ICD-10-CM | POA: Diagnosis not present

## 2022-07-26 NOTE — Progress Notes (Signed)
  Subjective:  Patient ID: Alec Solis, male    DOB: 1933-07-31,  MRN: [id number]  Alec Solis presents to clinic today for at risk foot care. Pt has h/o NIDDM with chronic kidney disease and corn(s)  left lower extremity, porokeratotic lesion(s) left lower extremity and painful mycotic nails. Painful toenails interfere with ambulation. Aggravating factors include wearing enclosed shoe gear. Pain is relieved with periodic professional debridement. Painful corns and porokeratotic lesion(s) aggravated when weightbearing with and without shoegear. Pain is relieved with periodic professional debridement.  Chief Complaint  Patient presents with   Nail Problem    Diabetic foot care BS-121 A1C-7.1 PCP- [name] PCP VST-Early 2023   New problem(s): None.   PCP is [name], MD.  No Known Allergies  Review of Systems: Negative except as noted in the HPI.  Objective:  Vitals:   07/24/22 1005  BP: (!) 142/80   Alec Solis is a pleasant 88 y.o. male WD, WN in NAD. AAO x 3.  Vascular Examination: CFT <3 seconds b/l. DP/PT pulses faintly palpable b/l. Skin temperature gradient warm to warm b/l. No ischemia or gangrene. No cyanosis or clubbing noted b/l.    Neurological Examination: Sensation grossly intact b/l with 10 gram monofilament. Vibratory sensation intact b/l.   Dermatological Examination: Pedal skin warm and supple b/l. Toenails 1-5 b/l thick, discolored, elongated with subungual debris and pain on dorsal palpation.    Hyperkeratotic lesion(s) medial IPJ of L 2nd toe; resolved lateral IPJ left great toe.  No erythema, no edema, no drainage, no fluctuance.   Porokeratotic lesion(s) plantar IPJ of right great toe and submet head 4 left foot. No erythema, no edema, no drainage, no fluctuance.  Musculoskeletal Examination: Muscle strength 5/5 to b/l LE. HAV with bunion bilaterally and hammertoes 2-5 b/l.  Radiographs: None  Assessment/Plan: 1. Pain due to  onychomycosis of toenails of both feet   2. Porokeratosis   3. Corns   4. Diabetes mellitus with stage 3 chronic kidney disease (HCC)     No orders of the defined types were placed in this encounter.  -Patient was evaluated and treated. All patient's and/or POA's questions/concerns answered on today's visit. -Replaced dancer's pad of insole of left shoe. -Continue foot and shoe inspections daily. Monitor blood glucose per PCP/Endocrinologist's recommendations. -Continue supportive shoe gear daily. -Mycotic toenails 1-5 bilaterally were debrided in length and girth with sterile nail nippers and dremel without incident. -Corn(s) L 2nd toe pared utilizing sterile scalpel blade without complication or incident. Total number debrided=1. -Porokeratotic lesion(s) plantar IPJ of right great toe and submet head 4 left foot pared and enucleated with sterile currette. Pinpoint bleeding submet head 4 left foot addressed with Lumicain Hemostatic Solution. Cleansed with alcohol and triple antibiotic ointment applied. No further treatment required. Total number of lesions debrided=2 -Dispensed tube foam for both great toes to prevent pressure on b/l 2nd digits. Apply every morning; remove every evening. -Patient/POA to call should there be question/concern in the interim.   Return in about 3 months (around 10/23/2022).  [name], [name]

## 2022-08-07 ENCOUNTER — Other Ambulatory Visit: Payer: Self-pay | Admitting: Internal Medicine

## 2022-08-10 ENCOUNTER — Telehealth: Payer: Self-pay

## 2022-08-10 NOTE — Telephone Encounter (Signed)
I called and left pt vm letting him know that his pen needles have arrived here at the office. Tyler Deis

## 2022-08-31 ENCOUNTER — Encounter (HOSPITAL_COMMUNITY): Payer: Self-pay | Admitting: *Deleted

## 2022-09-10 ENCOUNTER — Encounter (HOSPITAL_COMMUNITY): Payer: Self-pay | Admitting: *Deleted

## 2022-09-17 ENCOUNTER — Ambulatory Visit (INDEPENDENT_AMBULATORY_CARE_PROVIDER_SITE_OTHER): Payer: Medicare Other | Admitting: Internal Medicine

## 2022-09-17 ENCOUNTER — Encounter (HOSPITAL_COMMUNITY): Payer: Self-pay | Admitting: *Deleted

## 2022-09-17 ENCOUNTER — Other Ambulatory Visit: Payer: Self-pay

## 2022-09-17 ENCOUNTER — Encounter: Payer: Self-pay | Admitting: Internal Medicine

## 2022-09-17 VITALS — BP 124/82 | HR 78 | Temp 98.1°F | Ht 70.0 in | Wt 177.8 lb

## 2022-09-17 DIAGNOSIS — I129 Hypertensive chronic kidney disease with stage 1 through stage 4 chronic kidney disease, or unspecified chronic kidney disease: Secondary | ICD-10-CM | POA: Diagnosis not present

## 2022-09-17 DIAGNOSIS — R911 Solitary pulmonary nodule: Secondary | ICD-10-CM

## 2022-09-17 DIAGNOSIS — N1832 Chronic kidney disease, stage 3b: Secondary | ICD-10-CM | POA: Diagnosis not present

## 2022-09-17 DIAGNOSIS — Z794 Long term (current) use of insulin: Secondary | ICD-10-CM | POA: Diagnosis not present

## 2022-09-17 DIAGNOSIS — F329 Major depressive disorder, single episode, unspecified: Secondary | ICD-10-CM

## 2022-09-17 DIAGNOSIS — E782 Mixed hyperlipidemia: Secondary | ICD-10-CM | POA: Diagnosis not present

## 2022-09-17 DIAGNOSIS — Z6825 Body mass index (BMI) 25.0-25.9, adult: Secondary | ICD-10-CM | POA: Diagnosis not present

## 2022-09-17 DIAGNOSIS — E1122 Type 2 diabetes mellitus with diabetic chronic kidney disease: Secondary | ICD-10-CM

## 2022-09-17 LAB — POCT URINALYSIS DIPSTICK
Bilirubin, UA: NEGATIVE
Blood, UA: NEGATIVE
Glucose, UA: NEGATIVE
Ketones, UA: NEGATIVE
Leukocytes, UA: NEGATIVE
Nitrite, UA: NEGATIVE
Protein, UA: NEGATIVE
Spec Grav, UA: 1.01 (ref 1.010–1.025)
Urobilinogen, UA: 0.2 E.U./dL
pH, UA: 6 (ref 5.0–8.0)

## 2022-09-17 MED ORDER — ALLOPURINOL 100 MG PO TABS: 100.0000 mg | ORAL_TABLET | Freq: Two times a day (BID) | ORAL | 0 refills | Status: DC

## 2022-09-17 MED ORDER — OMEPRAZOLE 40 MG PO CPDR: 40.0000 mg | DELAYED_RELEASE_CAPSULE | Freq: Every day | ORAL | 1 refills | Status: DC

## 2022-09-17 NOTE — Progress Notes (Signed)
I,Alec Solis,acting as a scribe for Alec Greenland, MD.,have documented all relevant documentation on the behalf of Alec Greenland, MD,as directed by  Alec Greenland, MD while in the presence of Alec Greenland, MD.    Subjective:     Patient ID: Alec Solis , male    DOB: 06-03-1933 , 89 y.o.   MRN: [id number]   Chief Complaint  Patient presents with   Diabetes   Hypertension    HPI  Patient presents today for a f/u on his diabetes and bp. He reports compliance with his meds.  He states his sugars range from 79-200.  He states average sugar is 120s. He denies having cp, sob, palpitations. He states he is still NOT drinking Pepsi.   Shingles declined through [facility].   Diabetes He presents for his follow-up diabetic visit. He has type 2 diabetes mellitus. His disease course has been improving. There are no hypoglycemic associated symptoms. Pertinent negatives for hypoglycemia include no dizziness or headaches. Pertinent negatives for diabetes include no blurred vision, no chest pain, no fatigue, no polydipsia, no polyphagia and no polyuria. There are no hypoglycemic complications. Symptoms are improving. There are no diabetic complications. Risk factors for coronary artery disease include sedentary lifestyle, male sex, diabetes mellitus and hypertension. Current diabetic treatment includes oral agent (dual therapy). He is compliant with treatment most of the time. He participates in exercise intermittently. His home blood glucose trend is decreasing steadily. (Blood sugars are running lower down to 86, he is currently taking 20 units nightly) Eye exam is current.  Hypertension This is a chronic problem. The current episode started more than 1 year ago. The problem has been gradually improving since onset. The problem is controlled. Pertinent negatives include no blurred vision, chest pain, headaches, palpitations or shortness of breath. Risk factors for coronary artery disease  include diabetes mellitus, dyslipidemia, sedentary lifestyle and male gender. Identifiable causes of hypertension include chronic renal disease.  Hyperlipidemia Exacerbating diseases include chronic renal disease and diabetes. Pertinent negatives include no chest pain or shortness of breath. Current antihyperlipidemic treatment includes statins. Compliance problems include adherence to exercise.  Risk factors for coronary artery disease include diabetes mellitus, dyslipidemia, male sex, hypertension and a sedentary lifestyle.     Past Medical History:  Diagnosis Date   Anemia    Anxiety    Arthritis    Cataract    Chronic kidney disease    Depression    Diabetes mellitus without complication (HCC)    type II    GERD (gastroesophageal reflux disease)    Gout    Heart murmur    Hypertension    Myocardial infarction ([place])    minor Mi- years ago -    Varicose veins of left lower extremity      Family History  Problem Relation Age of Onset   Breast cancer Mother    Stomach cancer Mother    Diabetes Mellitus II Maternal Aunt      Current Outpatient Medications:    acetaminophen (TYLENOL) 650 MG CR tablet, Take 650 mg by mouth every 8 (eight) hours as needed for pain. , Disp: , Rfl:    amLODipine (NORVASC) 5 MG tablet, TAKE 1 TABLET(5 MG) BY MOUTH DAILY, Disp: 90 tablet, Rfl: 1   aspirin 81 MG tablet, Take 81 mg by mouth daily. , Disp: , Rfl:    B-D ULTRAFINE III SHORT PEN 31G X 8 MM MISC, USE AS DIRECTED FOUR TIMES DAILY, Disp: 100 each,  Rfl: 3   calcitRIOL (ROCALTROL) 0.25 MCG capsule, Take 0.25 mcg by mouth every other day. , Disp: , Rfl:    carboxymethylcellulose (REFRESH PLUS) 0.5 % SOLN, Place 1 drop into both eyes 3 (three) times daily as needed (Watery eyes). , Disp: , Rfl:    carvedilol (COREG) 12.5 MG tablet, Take 12.5 mg by mouth 2 (two) times daily with a meal., Disp: , Rfl:    ferrous sulfate 325 (65 FE) MG tablet, Take 325 mg by mouth daily with breakfast., Disp: , Rfl:     fluticasone (FLONASE) 50 MCG/ACT nasal spray, Place 2 sprays into both nostrils daily., Disp: 16 g, Rfl: 2   furosemide (LASIX) 40 MG tablet, Take 40 mg by mouth daily as needed for fluid or edema. , Disp: , Rfl:    glucose blood (TRUE METRIX BLOOD GLUCOSE TEST) test strip, Use as directed to check blood sugars 3 times per day dx: e11.65, Disp: 300 each, Rfl: 5   insulin degludec (TRESIBA FLEXTOUCH) 200 UNIT/ML FlexTouch Pen, Inject 18 Units into the skin daily., Disp: 27 mL, Rfl: 1   Insulin Pen Needle 32G X 8 MM MISC, Use as directed, Disp: 100 each, Rfl: 2   naproxen sodium (ALEVE) 220 MG tablet, Take 220 mg by mouth daily as needed (Body ache)., Disp: , Rfl:    Omega-3 Fatty Acids (FISH OIL OMEGA-3 PO), Take 1,200 mg by mouth daily. , Disp: , Rfl:    ondansetron (ZOFRAN) 4 MG tablet, TAKE 1 TABLET(4 MG) BY MOUTH DAILY AS NEEDED FOR NAUSEA OR VOMITING, Disp: 30 tablet, Rfl: 1   rosuvastatin (CRESTOR) 20 MG tablet, TAKE 1 TABLET BY MOUTH DAILY ON MONDAY TO FRIDAY, Disp: 75 tablet, Rfl: 1   sodium bicarbonate 650 MG tablet, Take 1,300 mg by mouth 2 (two) times daily., Disp: , Rfl:    TRUEplus Lancets 30G MISC, USE AS DIRECTED TO TEST BLOOD SUGAR THREE TIMES DAILY, Disp: 300 each, Rfl: 11   vitamin B-12 (CYANOCOBALAMIN) 1000 MCG tablet, Take 1,000 mcg by mouth daily., Disp: , Rfl:    allopurinol (ZYLOPRIM) 100 MG tablet, Take 1 tablet (100 mg total) by mouth 2 (two) times daily., Disp: 180 tablet, Rfl: 0   omeprazole (PRILOSEC) 40 MG capsule, Take 1 capsule (40 mg total) by mouth daily., Disp: 90 capsule, Rfl: 1   No Known Allergies   Review of Systems  Constitutional: Negative.  Negative for fatigue.  HENT: Negative.    Eyes:  Negative for blurred vision.  Respiratory: Negative.  Negative for shortness of breath.   Cardiovascular: Negative.  Negative for chest pain and palpitations.  Gastrointestinal: Negative.   Endocrine: Negative for polydipsia, polyphagia and polyuria.   Genitourinary: Negative.   Skin: Negative.   Allergic/Immunologic: Negative.   Neurological: Negative.  Negative for dizziness and headaches.  Psychiatric/Behavioral: Negative.       Today's Vitals   09/17/22 0911 09/17/22 0935  BP: (!) 142/90 124/82  Pulse: 78   Temp: 98.1 F (36.7 C)   SpO2: 98%   Weight: 177 lb 12.8 oz (80.6 kg)   Height: '5\' 10"'$  (1.778 m)   PainSc: 0-No pain    Body mass index is 25.51 kg/m.  Wt Readings from Last 3 Encounters:  09/17/22 177 lb 12.8 oz (80.6 kg)  05/18/22 180 lb 3.2 oz (81.7 kg)  03/11/22 178 lb 12.8 oz (81.1 kg)    Objective:  Physical Exam Vitals and nursing note reviewed.  Constitutional:      Appearance:  Normal appearance.  HENT:     Head: Normocephalic and atraumatic.     Nose:     Comments: Masked     Mouth/Throat:     Comments: Masked  Eyes:     Extraocular Movements: Extraocular movements intact.  Cardiovascular:     Rate and Rhythm: Normal rate and regular rhythm.     Heart sounds: Normal heart sounds.  Pulmonary:     Effort: Pulmonary effort is normal.     Breath sounds: Normal breath sounds.  Musculoskeletal:     Cervical back: Normal range of motion.  Skin:    General: Skin is warm.  Neurological:     General: No focal deficit present.     Mental Status: He is alert.  Psychiatric:        Mood and Affect: Mood normal.       Assessment And Plan:     1. Type 2 diabetes mellitus with stage 3b chronic kidney disease, with long-term current use of insulin (HCC) Comments: Chronic, I will check labs as below. I will adjust meds as needed. He is encouraged to have regular meals, avoid sugary beverages. I will adjust meds as needed. - CMP14+EGFR - Lipid panel - CBC - Hemoglobin A1c - TSH - Microalbumin / creatinine urine ratio - POCT Urinalysis Dipstick (81002)  2. Hypertensive nephropathy Comments: Chronic, he will c/w amlodipine and carvedilol. Also followed by Renal. Not currently on AceI or ARB. -  CMP14+EGFR - Microalbumin / creatinine urine ratio - POCT Urinalysis Dipstick (81002)  3. Mixed hyperlipidemia Comments: Chronic, LDL goal <70. He is currently on rosuvastatin. Need to consider switching to ATORVAstatin if renal function has decreased. - Lipid panel  4. Lung nodule Comments: He has upcoming CT chest in Feb 2024. I will make further recommendations once his labs are available for review.  5. BMI 25.0-25.9,adult Comments: He is encouraged to aim for at least 150 minutes of exercise/week.   Patient was given opportunity to ask questions. Patient verbalized understanding of the plan and was able to repeat key elements of the plan. All questions were answered to their satisfaction.   I, Alec Greenland, MD, have reviewed all documentation for this visit. The documentation on 09/17/22 for the exam, diagnosis, procedures, and orders are all accurate and complete.   IF YOU HAVE BEEN REFERRED TO A SPECIALIST, IT MAY TAKE 1-2 WEEKS TO SCHEDULE/PROCESS THE REFERRAL. IF YOU HAVE NOT HEARD FROM US/SPECIALIST IN TWO WEEKS, PLEASE GIVE [place] A CALL AT [phone] X [phone].   THE PATIENT IS ENCOURAGED TO PRACTICE SOCIAL DISTANCING DUE TO THE COVID-19 PANDEMIC.

## 2022-09-17 NOTE — Patient Instructions (Signed)

## 2022-09-18 LAB — CMP14+EGFR
ALT: 15 IU/L (ref 0–44)
AST: 21 IU/L (ref 0–40)
Albumin/Globulin Ratio: 1.3 (ref 1.2–2.2)
Albumin: 4.4 g/dL (ref 3.7–4.7)
Alkaline Phosphatase: 151 IU/L — ABNORMAL HIGH (ref 44–121)
BUN/Creatinine Ratio: 13 (ref 10–24)
BUN: 30 mg/dL — ABNORMAL HIGH (ref 8–27)
Bilirubin Total: 0.3 mg/dL (ref 0.0–1.2)
CO2: 20 mmol/L (ref 20–29)
Calcium: 10.1 mg/dL (ref 8.6–10.2)
Chloride: 103 mmol/L (ref 96–106)
Creatinine, Ser: 2.23 mg/dL — ABNORMAL HIGH (ref 0.76–1.27)
Globulin, Total: 3.5 g/dL (ref 1.5–4.5)
Glucose: 144 mg/dL — ABNORMAL HIGH (ref 70–99)
Potassium: 5.9 mmol/L — ABNORMAL HIGH (ref 3.5–5.2)
Sodium: 141 mmol/L (ref 134–144)
Total Protein: 7.9 g/dL (ref 6.0–8.5)
eGFR: 28 mL/min/{1.73_m2} — ABNORMAL LOW (ref >59)

## 2022-09-18 LAB — CBC
Hematocrit: 35.1 % — ABNORMAL LOW (ref 37.5–51.0)
Hemoglobin: 10.7 g/dL — ABNORMAL LOW (ref 13.0–17.7)
MCH: 22.5 pg — ABNORMAL LOW (ref 26.6–33.0)
MCHC: 30.5 g/dL — ABNORMAL LOW (ref 31.5–35.7)
MCV: 74 fL — ABNORMAL LOW (ref 79–97)
NRBC: 1 % — ABNORMAL HIGH (ref 0–0)
Platelets: 228 10*3/uL (ref 150–450)
RBC: 4.76 x10E6/uL (ref 4.14–5.80)
RDW: 19.2 % — ABNORMAL HIGH (ref 11.6–15.4)
WBC: 6.5 10*3/uL (ref 3.4–10.8)

## 2022-09-18 LAB — LIPID PANEL
Chol/HDL Ratio: 4.4 ratio (ref 0.0–5.0)
Cholesterol, Total: 110 mg/dL (ref 100–199)
HDL: 25 mg/dL — ABNORMAL LOW (ref >39)
LDL Chol Calc (NIH): 51 mg/dL (ref 0–99)
Triglycerides: 210 mg/dL — ABNORMAL HIGH (ref 0–149)
VLDL Cholesterol Cal: 34 mg/dL (ref 5–40)

## 2022-09-18 LAB — HEMOGLOBIN A1C
Est. average glucose Bld gHb Est-mCnc: 160 mg/dL
Hgb A1c MFr Bld: 7.2 % — ABNORMAL HIGH (ref 4.8–5.6)

## 2022-09-18 LAB — TSH: TSH: 2.76 u[IU]/mL (ref 0.450–4.500)

## 2022-09-19 LAB — MICROALBUMIN / CREATININE URINE RATIO
Creatinine, Urine: 47.5 mg/dL
Microalb/Creat Ratio: 11 mg/g creat (ref 0–29)
Microalbumin, Urine: 5.4 ug/mL

## 2022-09-20 DIAGNOSIS — I129 Hypertensive chronic kidney disease with stage 1 through stage 4 chronic kidney disease, or unspecified chronic kidney disease: Secondary | ICD-10-CM | POA: Insufficient documentation

## 2022-09-20 DIAGNOSIS — Z6825 Body mass index (BMI) 25.0-25.9, adult: Secondary | ICD-10-CM | POA: Insufficient documentation

## 2022-09-20 DIAGNOSIS — R911 Solitary pulmonary nodule: Secondary | ICD-10-CM | POA: Insufficient documentation

## 2022-09-20 DIAGNOSIS — I1310 Hypertensive heart and chronic kidney disease without heart failure, with stage 1 through stage 4 chronic kidney disease, or unspecified chronic kidney disease: Secondary | ICD-10-CM | POA: Insufficient documentation

## 2022-09-20 DIAGNOSIS — E782 Mixed hyperlipidemia: Secondary | ICD-10-CM | POA: Insufficient documentation

## 2022-09-21 ENCOUNTER — Other Ambulatory Visit: Payer: Self-pay | Admitting: Internal Medicine

## 2022-10-06 ENCOUNTER — Encounter (HOSPITAL_COMMUNITY): Payer: Self-pay | Admitting: *Deleted

## 2022-10-07 ENCOUNTER — Encounter (HOSPITAL_COMMUNITY): Payer: Self-pay | Admitting: *Deleted

## 2022-10-14 ENCOUNTER — Ambulatory Visit
Admission: RE | Admit: 2022-10-14 | Discharge: 2022-10-14 | Disposition: A | Discharge status: Home / Self Care | Payer: Medicare Other | Source: Ambulatory Visit | Attending: Nurse Practitioner | Admitting: Nurse Practitioner

## 2022-10-14 DIAGNOSIS — J439 Emphysema, unspecified: Secondary | ICD-10-CM | POA: Diagnosis not present

## 2022-10-14 DIAGNOSIS — Z87891 Personal history of nicotine dependence: Secondary | ICD-10-CM

## 2022-10-14 DIAGNOSIS — R911 Solitary pulmonary nodule: Secondary | ICD-10-CM

## 2022-10-14 DIAGNOSIS — R918 Other nonspecific abnormal finding of lung field: Secondary | ICD-10-CM | POA: Diagnosis not present

## 2022-10-14 DIAGNOSIS — J479 Bronchiectasis, uncomplicated: Secondary | ICD-10-CM | POA: Diagnosis not present

## 2022-10-22 DIAGNOSIS — L821 Other seborrheic keratosis: Secondary | ICD-10-CM | POA: Diagnosis not present

## 2022-10-26 ENCOUNTER — Encounter (HOSPITAL_COMMUNITY): Payer: Self-pay | Admitting: *Deleted

## 2022-10-28 DIAGNOSIS — N2581 Secondary hyperparathyroidism of renal origin: Secondary | ICD-10-CM | POA: Diagnosis not present

## 2022-10-28 DIAGNOSIS — E1129 Type 2 diabetes mellitus with other diabetic kidney complication: Secondary | ICD-10-CM | POA: Diagnosis not present

## 2022-10-28 DIAGNOSIS — M109 Gout, unspecified: Secondary | ICD-10-CM | POA: Diagnosis not present

## 2022-10-28 DIAGNOSIS — I129 Hypertensive chronic kidney disease with stage 1 through stage 4 chronic kidney disease, or unspecified chronic kidney disease: Secondary | ICD-10-CM | POA: Diagnosis not present

## 2022-10-28 DIAGNOSIS — N183 Chronic kidney disease, stage 3 unspecified: Secondary | ICD-10-CM | POA: Diagnosis not present

## 2022-10-28 DIAGNOSIS — D631 Anemia in chronic kidney disease: Secondary | ICD-10-CM | POA: Diagnosis not present

## 2022-10-28 DIAGNOSIS — E1122 Type 2 diabetes mellitus with diabetic chronic kidney disease: Secondary | ICD-10-CM | POA: Diagnosis not present

## 2022-11-09 ENCOUNTER — Ambulatory Visit (INDEPENDENT_AMBULATORY_CARE_PROVIDER_SITE_OTHER): Payer: Medicare Other | Admitting: Podiatry

## 2022-11-09 DIAGNOSIS — Z91199 Patient's noncompliance with other medical treatment and regimen due to unspecified reason: Secondary | ICD-10-CM

## 2022-11-09 NOTE — Progress Notes (Signed)
1. No-show for appointment     

## 2022-11-11 ENCOUNTER — Encounter (HOSPITAL_COMMUNITY): Payer: Self-pay | Admitting: *Deleted

## 2022-11-11 ENCOUNTER — Encounter: Payer: Self-pay | Admitting: Podiatry

## 2022-11-11 ENCOUNTER — Ambulatory Visit (INDEPENDENT_AMBULATORY_CARE_PROVIDER_SITE_OTHER): Payer: Medicare Other | Admitting: Podiatry

## 2022-11-11 VITALS — BP 143/83

## 2022-11-11 DIAGNOSIS — E1122 Type 2 diabetes mellitus with diabetic chronic kidney disease: Secondary | ICD-10-CM | POA: Diagnosis not present

## 2022-11-11 DIAGNOSIS — N183 Chronic kidney disease, stage 3 unspecified: Secondary | ICD-10-CM

## 2022-11-11 DIAGNOSIS — B351 Tinea unguium: Secondary | ICD-10-CM | POA: Diagnosis not present

## 2022-11-11 DIAGNOSIS — L84 Corns and callosities: Secondary | ICD-10-CM

## 2022-11-11 DIAGNOSIS — Q828 Other specified congenital malformations of skin: Secondary | ICD-10-CM | POA: Diagnosis not present

## 2022-11-11 DIAGNOSIS — M79675 Pain in left toe(s): Secondary | ICD-10-CM | POA: Diagnosis not present

## 2022-11-11 DIAGNOSIS — M79674 Pain in right toe(s): Secondary | ICD-10-CM

## 2022-11-11 NOTE — Progress Notes (Unsigned)
  Subjective:  Patient ID: Alec Solis, male    DOB: Dec 08, 1932,  MRN: [id number]  Alec Solis presents to clinic today for {jgcomplaint:23593}  Chief Complaint  Patient presents with   Nail Problem    [facility] BS-Over 200 A1C-do not know PCP-[facility] PCP VST-09/2022   New problem(s): None. {jgcomplaint:23593}  PCP is [name], MD.  No Known Allergies  Review of Systems: Negative except as noted in the HPI.  Objective: No changes noted in today's physical examination. Vitals:   11/11/22 1407  BP: (!) 143/83   Alec Solis is a pleasant 89 y.o. male {jgbodyhabitus:24098} AAO x 3.  Vascular Examination: CFT <3 seconds b/l. DP/PT pulses faintly palpable b/l. Skin temperature gradient warm to warm b/l. No ischemia or gangrene. No cyanosis or clubbing noted b/l.    Neurological Examination: Sensation grossly intact b/l with 10 gram monofilament. Vibratory sensation intact b/l.   Dermatological Examination: Pedal skin warm and supple b/l. Toenails 1-5 b/l thick, discolored, elongated with subungual debris and pain on dorsal palpation.    Hyperkeratotic lesion(s) medial IPJ of L 2nd toe; resolved lateral IPJ left great toe.  No erythema, no edema, no drainage, no fluctuance.   Porokeratotic lesion(s) plantar IPJ of right great toe and submet head 4 left foot. No erythema, no edema, no drainage, no fluctuance.  Musculoskeletal Examination: Muscle strength 5/5 to b/l LE. HAV with bunion bilaterally and hammertoes 2-5 b/l.  Radiographs: None Assessment/Plan: No diagnosis found.  No orders of the defined types were placed in this encounter.   None {Jgplan:23602::"-Patient/POA to call should there be question/concern in the interim."}   Return in about 3 months (around 02/11/2023).  [name], [name]

## 2022-12-09 ENCOUNTER — Encounter (HOSPITAL_COMMUNITY): Payer: Self-pay | Admitting: *Deleted

## 2022-12-14 ENCOUNTER — Telehealth: Payer: Self-pay

## 2022-12-14 NOTE — Progress Notes (Signed)
Care Management & Coordination Services Pharmacy Team  Reason for Encounter: Appointment Reminder  Contacted patient to confirm in office appointment with Cherylin Mylar, PharmD on 12-16-2022 at 10:00. Spoke with patient on 12/14/2022   Do you have any problems getting your medications? No  What is your top health concern you would like to discuss at your upcoming visit? Patient stated he would like to know CT results  Have you seen any other providers since your last visit with PCP? Yes   Chart review: Recent office visits:  09-17-2022 Dorothyann Peng, MD. Hemo= 10.7, Hema= 35.1, MCV= 74, MCH= 22.5, MCHC= 30.5, RDW= 19.2, NRBC= 1. Glucose= 144, BUN= 30, Creatinine= 2.23, eGFR= 28, Potassium= 5.9, Alkaline= 151. A1C= 7.2. Trig= 210, HDL= 25  Recent consult visits:  11-11-2022 Freddie Breech, [name] (Podiatry).-Toenails 1-5 b/l were debrided in length and girth with sterile nail nippers and dremel without iatrogenic bleeding.   07-24-2022 Freddie Breech, [name] (Podiatry).-Toenails 1-5 b/l were debrided in length and girth with sterile nail nippers and dremel without iatrogenic bleeding.   Hospital visits:  None in previous 6 months   Star Rating Drugs:  Rosuvastatin 20 mg- Last filled 10-06-2022 90 DS [facility]. Previous 05-18-2022 90 DS  Care Gaps: Annual wellness visit in last year? Yes Covd booster overdue 2nd shingrix overdue  If Diabetic: Last eye exam / retinopathy screening: Patient stated 10-22-2022 Last diabetic foot exam: 07-24-2022   Huey Romans [facility] Clinical Pharmacist Assistant [phone]

## 2022-12-16 ENCOUNTER — Ambulatory Visit: Payer: Medicare Other

## 2022-12-16 NOTE — Progress Notes (Signed)
Care Management & Coordination Services Pharmacy Note  12/16/2022 Name:  Alec Solis MRN:  [id number] DOB:  September 18, 1932  Summary: Patient reports for his follow up visit in office due to his confusion over the phone about his medications and what they are used for. Patient reports that he is going to be with his daughter and her family for mothers day so any other appointments need to be at least two  weeks after mothers day. He is going to be taking the train.   Recommendations/Changes made from today's visit: Alec Solis to start eating more vegetables with his meals at least during lunch time everyday for the next month He is going to decrease the amount of lemonade he is drinking by drinking a half cup of lemonade and half cup of water  He is going to use the ADA diabetes log sheet to record his BS readings.  Follow up plan: Follow up with Alec Solis after mothers day to see how he is doing with his blood sugars and checking it at least twice per week  Collaborate with PCP to send referrals for patient to be seen by pulmonology and cardiology team.   Patient reports that he is open to going to a community center in the day to socialize.  Collaborate with PCP to start patient on Farxiga 10 mg tablet once per day based on patients eGFR, and DM II. Patient will need patient assistance for this medication.   Subjective: Alec Solis is an 89 y.o. year old male who is a primary patient of Dorothyann Peng, MD.  The care coordination team was consulted for assistance with disease management and care coordination needs.    Engaged with patient by telephone for follow up visit.  Recent office visits:  09-17-2022 Dorothyann Peng, MD. Hemo= 10.7, Hema= 35.1, MCV= 74, MCH= 22.5, MCHC= 30.5, RDW= 19.2, NRBC= 1. Glucose= 144, BUN= 30, Creatinine= 2.23, eGFR= 28, Potassium= 5.9, Alkaline= 151. A1C= 7.2. Trig= 210, HDL= 25   Recent consult visits:  11-11-2022 Freddie Breech, [name]  (Podiatry).-Toenails 1-5 b/l were debrided in length and girth with sterile nail nippers and dremel without iatrogenic bleeding.    07-24-2022 Freddie Breech, [name] (Podiatry).-Toenails 1-5 b/l were debrided in length and girth with sterile nail nippers and dremel without iatrogenic bleeding.    Hospital visits:  None in previous 6 months   Objective:  Lab Results  Component Value Date   CREATININE 2.23 (H) 09/17/2022   BUN 30 (H) 09/17/2022   EGFR 28 (L) 09/17/2022   GFRNONAA 27 (L) 10/01/2020   GFRAA 31 (L) 10/01/2020   NA 141 09/17/2022   K 5.9 (H) 09/17/2022   CALCIUM 10.1 09/17/2022   CO2 20 09/17/2022   GLUCOSE 144 (H) 09/17/2022    Lab Results  Component Value Date/Time   HGBA1C 7.2 (H) 09/17/2022 09:58 AM   HGBA1C 7.2 (H) 05/18/2022 09:53 AM   HGBA1C 9.6 05/07/2021 12:00 AM   MICROALBUR 30 07/29/2021 09:47 AM   MICROALBUR 80 02/20/2020 11:46 AM    Last diabetic Eye exam:  Lab Results  Component Value Date/Time   HMDIABEYEEXA No Retinopathy 10/27/2021 12:00 AM    Last diabetic Foot exam: No results found for: "HMDIABFOOTEX"   Lab Results  Component Value Date   CHOL 110 09/17/2022   HDL 25 (L) 09/17/2022   LDLCALC 51 09/17/2022   TRIG 210 (H) 09/17/2022   CHOLHDL 4.4 09/17/2022       Latest Ref Rng & Units 09/17/2022  9:58 AM 10/30/2021    9:16 AM 07/29/2021    9:04 AM  Hepatic Function  Total Protein 6.0 - 8.5 g/dL 7.9  7.8  7.7   Albumin 3.7 - 4.7 g/dL 4.4  4.4  4.7   AST 0 - 40 IU/L 21  16  18    ALT 0 - 44 IU/L 15  9  13    Alk Phosphatase 44 - 121 IU/L 151  142  145   Total Bilirubin 0.0 - 1.2 mg/dL 0.3  0.3  0.3     Lab Results  Component Value Date/Time   TSH 2.760 09/17/2022 09:58 AM       Latest Ref Rng & Units 09/17/2022    9:58 AM 10/30/2021    9:16 AM 01/09/2021   10:14 AM  CBC  WBC 3.4 - 10.8 x10E3/uL 6.5  6.1  5.7   Hemoglobin 13.0 - 17.7 g/dL 16.1  9.2  9.2   Hematocrit 37.5 - 51.0 % 35.1  31.1  31.5   Platelets 150 - 450  x10E3/uL 228  204  263     Lab Results  Component Value Date/Time   VITAMINB12 685 06/01/2019 02:16 PM   VITAMINB12 424 12/02/2011 02:04 PM    Clinical ASCVD: Yes       09/17/2022    9:38 AM 09/17/2022    9:35 AM 09/17/2022    9:09 AM  Depression screen PHQ 2/9  Decreased Interest   0  Down, Depressed, Hopeless   0  PHQ - 2 Score   0  Altered sleeping  0   Tired, decreased energy 0    Change in appetite 1    Feeling bad or failure about yourself  0    Trouble concentrating 0    Moving slowly or fidgety/restless 0    Suicidal thoughts 0       Social History   Tobacco Use  Smoking Status Former   Packs/day: 0.50   Years: 50.00   Additional pack years: 0.00   Total pack years: 25.00   Types: Cigarettes   Start date: 08/24/1952   Quit date: 08/24/2014   Years since quitting: 8.3  Smokeless Tobacco Never   BP Readings from Last 3 Encounters:  11/11/22 (!) 143/83  09/17/22 124/82  07/24/22 (!) 142/80   Pulse Readings from Last 3 Encounters:  09/17/22 78  05/18/22 80  03/11/22 68   Wt Readings from Last 3 Encounters:  09/17/22 177 lb 12.8 oz (80.6 kg)  05/18/22 180 lb 3.2 oz (81.7 kg)  03/11/22 178 lb 12.8 oz (81.1 kg)   BMI Readings from Last 3 Encounters:  09/17/22 25.51 kg/m  05/18/22 25.86 kg/m  03/11/22 25.66 kg/m    No Known Allergies  Medications Reviewed Today     Reviewed by Freddie Breech, [name] (Physician) on 11/11/22 at 1412  Med List Status: <None>   Medication Order Taking? Sig Documenting Provider Last Dose Status Informant  acetaminophen (TYLENOL) 650 MG CR tablet [id number] No Take 650 mg by mouth every 8 (eight) hours as needed for pain.  [provider] Taking Active Self  allopurinol (ZYLOPRIM) 100 MG tablet [id number]  Take 1 tablet (100 mg total) by mouth 2 (two) times daily. Dorothyann Peng, MD  Active   amLODipine (NORVASC) 5 MG tablet [id number] No TAKE 1 TABLET(5 MG) BY MOUTH DAILY Arnette Felts, FNP Taking Active    aspirin 81 MG tablet [id number] No Take 81 mg by mouth daily.  [provider] Taking  Active Self  B-D ULTRAFINE III SHORT PEN 31G X 8 MM MISC [id number] No USE AS DIRECTED FOUR TIMES DAILY Dorothyann Peng, MD Taking Active Self  calcitRIOL (ROCALTROL) 0.25 MCG capsule [id number] No Take 0.25 mcg by mouth every other day.  [provider] Taking Active Self  carboxymethylcellulose (REFRESH PLUS) 0.5 % SOLN [id number] No Place 1 drop into both eyes 3 (three) times daily as needed (Watery eyes).  [provider] Taking Active Self  carvedilol (COREG) 12.5 MG tablet [id number] No Take 12.5 mg by mouth 2 (two) times daily with a meal. [provider] Taking Active Self  ferrous sulfate 325 (65 FE) MG tablet [id number] No Take 325 mg by mouth daily with breakfast. [provider] Taking Active Self  fluticasone (FLONASE) 50 MCG/ACT nasal spray [id number] No Place 2 sprays into both nostrils daily. Arnette Felts, FNP Taking Active   furosemide (LASIX) 40 MG tablet [id number] No Take 40 mg by mouth daily as needed for fluid or edema.  [provider] Taking Active Self           Med Note Lenoria Farrier   Wed Mar 27, 2020 10:27 AM)    glucose blood (TRUE METRIX BLOOD GLUCOSE TEST) test strip [id number] No Use as directed to check blood sugars 3 times per day dx: e11.65 Dorothyann Peng, MD Taking Active   insulin degludec (TRESIBA FLEXTOUCH) 200 UNIT/ML FlexTouch Pen [id number] No Inject 18 Units into the skin daily. Dorothyann Peng, MD Taking Active   Insulin Pen Needle 32G X 8 MM MISC [id number] No Use as directed Dorothyann Peng, MD Taking Active   Omega-3 Fatty Acids (FISH OIL OMEGA-3 PO) [id number] No Take 1,200 mg by mouth daily.  [provider] Taking Active Self           Med Note Lenoria Farrier   Wed Mar 27, 2020 10:31 AM)    omeprazole (PRILOSEC) 40 MG capsule [id number]  Take 1 capsule (40 mg total) by mouth daily. Dorothyann Peng, MD  Active    ondansetron [facility] 352) 4 MG tablet [id number] No TAKE 1 TABLET(4 MG) BY MOUTH DAILY AS NEEDED FOR NAUSEA OR VOMITING Dorothyann Peng, MD Taking Active   rosuvastatin (CRESTOR) 20 MG tablet [id number] No TAKE 1 TABLET BY MOUTH DAILY ON MONDAY TO Elpidio Galea, FNP Taking Active   sodium bicarbonate 650 MG tablet [id number] No Take 1,300 mg by mouth 2 (two) times daily. [provider] Taking Active Self  TRUEplus Lancets 30G MISC [id number] No USE AS DIRECTED TO TEST BLOOD SUGAR THREE TIMES DAILY Dorothyann Peng, MD Taking Active   vitamin B-12 (CYANOCOBALAMIN) 1000 MCG tablet [id number] No Take 1,000 mcg by mouth daily. [provider] Taking Active Self            SDOH:  (Social Determinants of Health) assessments and interventions performed: No SDOH Interventions    Flowsheet Row Office Visit from 09/17/2022 in [facility] Chronic Care Management from 06/17/2021 in [facility] Chronic Care Management from 09/04/2020 in [facility] Office Visit from 07/01/2020 in [facility] Chronic Care Management from 05/08/2020 in [facility] Clinical Support from 05/23/2019 in [facility]  SDOH Interventions        Depression Interventions/Treatment  Patient refuses Treatment Patient refuses Treatment --  Stann Mainland query this for now] Patient refuses Treatment -- PHQ2-9 Score <4 Follow-up Not  Indicated  Financial Strain Interventions -- -- -- -- Other (Comment)  [Will continue to assist with Tresiba patient assistance refills as needed] --       Medication Assistance:  Evaristo Bury obtained through [employer] nordisk medication assistance program.  Enrollment ends 07/2023  Medication Access: Within the past 30 days, how often has patient missed a dose of medication? Just once this month  Is a  pillbox or other method used to improve adherence? No  Factors that may affect medication adherence? financial need, nonadherence to medications, and lack of understanding of disease management Are meds synced by current pharmacy? No  Are meds delivered by current pharmacy? No  Does patient experience delays in picking up medications due to transportation concerns? No   [facility] Services Reviewed: Is patient disadvantaged to use [facility]?: No  Current Rx insurance plan: Medicare Part A/B, Professional [employer]/[place]  Name and location of Current pharmacy:  [id number] [place], [place] - [address] AT [facility] OF [facility] & [address] [place] [address] Phone: [phone] Fax: [phone]  [facility] services reviewed with patient today?: No   Compliance/Adherence/Medication fill history: Care Gaps: COVID-19 Vaccine  Shingrix Vaccine  Ophthalmology Exam   Star-Rating Drugs: Rosuvastatin 20 mg- Last filled 10-06-2022 90 DS [facility]. Previous 05-18-2022 90 DS    Assessment/Plan   Hypertension (BP goal <140/90) -Not ideally controlled -Current treatment: Amlodipine 5 mg tablet once per day Appropriate, Effective, Safe, Accessible Carvedilol 12.5 mg tablet twice per day Appropriate, Effective, Safe, Accessible -Current home readings: none noted at this time  -Current dietary habits: he reports that he does not add extra salt to his food but he does reports that he is mostly eating out.  -Denies hypotensive/hypertensive symptoms -Educated on Daily salt intake goal < 2300 mg; Importance of home blood pressure monitoring; -Counseled to monitor BP at home at least once per week, document, and provide log at future appointments -Recommended to continue current medication   Diabetes (A1c goal <8%) -Not ideally controlled -Current medications: Tresiba - 18 units daily Appropriate, Effective, Safe, Accessible   -Current home glucose readings: patient reports that they are fluctuating based on what he eats sometimes over 200  fasting glucose: 101, 101, 132  -Denies hypoglycemic/hyperglycemic symptoms -Current meal patterns:   Brunch: Mashed potatoes, gravy, meat loaf and green beans   Dinner: salad, lettuce tomato, blue cheese, blue cheese dressing Beverages: lemonade about cup, and a bottle of water  -Current exercise: he is not currently exercising  -Educated on A1c and blood sugar goals; Carbohydrate counting and/or plate method -Counseled to check feet daily and get yearly eye exams -Recommended to continue current medication will collaborate with PCP to start Farxiga 10 mg tablet once per day. , last labs in March completed at specialist appointment eGFR 34   Cherylin Mylar, CPP, PharmD Clinical Pharmacist Practitioner [facility] [phone]

## 2022-12-21 ENCOUNTER — Other Ambulatory Visit: Payer: Self-pay | Admitting: Internal Medicine

## 2022-12-21 DIAGNOSIS — J438 Other emphysema: Secondary | ICD-10-CM

## 2022-12-22 ENCOUNTER — Telehealth: Payer: Self-pay

## 2022-12-22 NOTE — Progress Notes (Signed)
As of 12/16/2022 patient picked up 2 boxes of Novofine needles, Novo Nordisk patient assistance from PCP office.   Billee Cashing, CMA Clinical Pharmacist Assistant [phone]

## 2022-12-24 ENCOUNTER — Other Ambulatory Visit: Payer: Self-pay | Admitting: Internal Medicine

## 2022-12-24 DIAGNOSIS — N1832 Chronic kidney disease, stage 3b: Secondary | ICD-10-CM

## 2022-12-24 DIAGNOSIS — E782 Mixed hyperlipidemia: Secondary | ICD-10-CM

## 2022-12-24 DIAGNOSIS — I129 Hypertensive chronic kidney disease with stage 1 through stage 4 chronic kidney disease, or unspecified chronic kidney disease: Secondary | ICD-10-CM

## 2022-12-24 DIAGNOSIS — R911 Solitary pulmonary nodule: Secondary | ICD-10-CM

## 2022-12-25 ENCOUNTER — Telehealth: Payer: Self-pay | Admitting: *Deleted

## 2022-12-25 NOTE — Progress Notes (Unsigned)
  Care Coordination  Outreach Note  12/25/2022 Name: Rembrandt Dayan MRN: [id number] DOB: 1933-01-10   Care Coordination Outreach Attempts: An unsuccessful telephone outreach was attempted today to offer the patient information about available care coordination services.  Follow Up Plan:  Additional outreach attempts will be made to offer the patient care coordination information and services.   Encounter Outcome:  No Answer  Burman Nieves, CCMA Care Coordination Care Guide Direct Dial: [phone]

## 2022-12-28 ENCOUNTER — Other Ambulatory Visit: Payer: Self-pay | Admitting: Internal Medicine

## 2022-12-28 NOTE — Progress Notes (Signed)
  Care Coordination   Note   12/28/2022 Name: Deval Mcfarren MRN: [id number] DOB: February 05, 1933  Daveyon Payne is a 89 y.o. year old male who sees Dorothyann Peng, MD for primary care. I reached out to Hulen Shouts by phone today to offer care coordination services.  Mr. Vold was given information about Care Coordination services today including:   The Care Coordination services include support from the care team which includes your Nurse Coordinator, Clinical Social Worker, or Pharmacist.  The Care Coordination team is here to help remove barriers to the health concerns and goals most important to you. Care Coordination services are voluntary, and the patient may decline or stop services at any time by request to their care team member.   Care Coordination Consent Status: Patient did not agree to participate in care coordination services at this time.  Encounter Outcome:  Pt. Refused  [facility] Coordination Care Guide  Direct Dial: [phone]

## 2023-01-14 ENCOUNTER — Encounter (HOSPITAL_COMMUNITY): Payer: Self-pay | Admitting: *Deleted

## 2023-01-14 ENCOUNTER — Ambulatory Visit (INDEPENDENT_AMBULATORY_CARE_PROVIDER_SITE_OTHER): Payer: Medicare Other | Admitting: Internal Medicine

## 2023-01-14 ENCOUNTER — Encounter: Payer: Self-pay | Admitting: Internal Medicine

## 2023-01-14 VITALS — BP 120/80 | HR 91 | Temp 99.1°F | Ht 70.0 in | Wt 179.4 lb

## 2023-01-14 DIAGNOSIS — J432 Centrilobular emphysema: Secondary | ICD-10-CM | POA: Diagnosis not present

## 2023-01-14 DIAGNOSIS — R918 Other nonspecific abnormal finding of lung field: Secondary | ICD-10-CM | POA: Diagnosis not present

## 2023-01-14 NOTE — Progress Notes (Deleted)
Alec Solis    [id number]    10-27-1932  Primary Care Physician:Sanders, Melina Schools, MD  Referring Physician: Dorothyann Peng, MD [address] [place],  [place] [address] Reason for Consultation: *** Date of Consultation: 01/14/2023  Chief complaint:   Chief Complaint  Patient presents with   Consult    Has had emphysema for over 20 years.  Does not take any medication to treat it.  He gets sob at time, but states it is not bad.     HPI:  Diagnosed with emphysema 20 years ago when living in [address]   He denies difficulty with his breathing, no chest tightness, wheezing or coughing.   Had recent contacts with patients with strep throat. He is bringing up some mucus.   Denies recurrent bronchitis or pneumonia. No childhood respiratory disease.   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  Exposures: lives independently, wife passed away.  Smoking history: 50 years x 0.5 ppd = 25 pack years.   Social History   Occupational History   Occupation: retired  Tobacco Use   Smoking status: Former    Packs/day: 0.50    Years: 50.00    Additional pack years: 0.00    Total pack years: 25.00    Types: Cigarettes    Start date: 08/24/1952    Quit date: 08/24/2014    Years since quitting: 8.3   Smokeless tobacco: Never  Vaping Use   Vaping Use: Never used  Substance and Sexual Activity   Alcohol use: Yes    Comment: occassional wine beer   Drug use: No   Sexual activity: Not Currently    Relevant family history:  Family History  Problem Relation Age of Onset   Breast cancer Mother    Stomach cancer Mother    Diabetes Mellitus II Maternal Aunt    Lung cancer Neg Hx    Lung disease Neg Hx     Past Medical History:  Diagnosis Date   Anemia    Anxiety    Arthritis    Cataract    Chronic kidney disease    Depression    Diabetes mellitus without  complication (HCC)    type II    GERD (gastroesophageal reflux disease)    Gout    Heart murmur    Hypertension    Myocardial infarction (HCC)    minor Mi- years ago -    Varicose veins of left lower extremity     Past Surgical History:  Procedure Laterality Date   BUNIONECTOMY     cataract     COLONOSCOPY WITH PROPOFOL N/A 05/28/2017   Procedure: COLONOSCOPY WITH PROPOFOL;  Surgeon: Jeani Hawking, MD;  Location: [facility];  Service: Endoscopy;  Laterality: N/A;   COLONOSCOPY WITH PROPOFOL N/A 03/29/2020   Procedure: COLONOSCOPY WITH PROPOFOL;  Surgeon: Jeani Hawking, MD;  Location: [facility];  Service: Endoscopy;  Laterality: N/A;   ENTEROSCOPY N/A 03/29/2020   Procedure: ENTEROSCOPY;  Surgeon: Jeani Hawking, MD;  Location: [facility];  Service: Endoscopy;  Laterality: N/A;   ESOPHAGOGASTRODUODENOSCOPY (EGD) WITH PROPOFOL N/A 05/28/2017   Procedure: ESOPHAGOGASTRODUODENOSCOPY (EGD) WITH PROPOFOL;  Surgeon: Jeani Hawking, MD;  Location: [facility];  Service: Endoscopy;  Laterality: N/A;   HEMOSTASIS CLIP PLACEMENT  03/29/2020   Procedure: HEMOSTASIS CLIP PLACEMENT;  Surgeon: Jeani Hawking, MD;  Location: [facility];  Service: Endoscopy;;   HOT HEMOSTASIS N/A 03/29/2020   Procedure: HOT HEMOSTASIS (ARGON PLASMA COAGULATION/BICAP);  Surgeon: Jeani Hawking, MD;  Location: [facility];  Service: Endoscopy;  Laterality: N/A;   POLYPECTOMY  03/29/2020   Procedure: POLYPECTOMY;  Surgeon: Jeani Hawking, MD;  Location: [facility];  Service: Endoscopy;;     Physical Exam: Blood pressure 120/80, pulse 91, temperature 99.1 F (37.3 C), temperature source Oral, height 5\' 10"  (1.778 m), weight 179 lb 6.4 oz (81.4 kg), SpO2 99 %. Gen:      No acute distress ENT:  ***no nasal polyps, mucus membranes moist Lungs:    No increased respiratory effort, symmetric chest wall excursion, clear to auscultation bilaterally, ***no wheezes or crackles CV:         Regular rate and rhythm; no murmurs,  rubs, or gallops.  No pedal edema Abd:      + bowel sounds; soft, non-tender; no distension MSK: no acute synovitis of DIP or PIP joints, no mechanics hands.  Skin:      Warm and dry; no rashes*** Neuro: normal speech, no focal facial asymmetry Psych: alert and oriented x3, normal mood and affect   Data Reviewed/Medical Decision Making:  Independent interpretation of tests: Imaging:  Review of patient's *** images revealed ***. The patient's images have been independently reviewed by me.    PFTs: I have personally reviewed the patient's PFTs and ***     No data to display          Labs:  Lab Results  Component Value Date   NA 141 09/17/2022   K 5.9 (H) 09/17/2022   CO2 20 09/17/2022   GLUCOSE 144 (H) 09/17/2022   BUN 30 (H) 09/17/2022   CREATININE 2.23 (H) 09/17/2022   CALCIUM 10.1 09/17/2022   EGFR 28 (L) 09/17/2022   GFRNONAA 27 (L) 10/01/2020   Lab Results  Component Value Date   WBC 6.5 09/17/2022   HGB 10.7 (L) 09/17/2022   HCT 35.1 (L) 09/17/2022   MCV 74 (L) 09/17/2022   PLT 228 09/17/2022     Immunization status:  Immunization History  Administered Date(s) Administered   Fluad Quad(high Dose 65+) 05/18/2022   Influenza, High Dose Seasonal PF 05/25/2018, 04/19/2019   Influenza-Unspecified 05/24/2013, 05/31/2018, 05/07/2021   PFIZER(Purple Top)SARS-COV-2 Vaccination 10/23/2019, 11/15/2019, 09/12/2020   PNEUMOCOCCAL CONJUGATE-20 10/30/2021   Pfizer Covid-19 Vaccine Bivalent Booster 63yrs & up 05/29/2021   Pneumococcal Polysaccharide-23 05/24/2013, 05/24/2017   Tdap 05/22/2013   Zoster Recombinat (Shingrix) 06/19/2022     I reviewed prior external note(s) from ***  I reviewed the result(s) of the labs and imaging as noted above.   I have ordered ***  Discussion of management or test interpretation with another colleague ***.   Assessment:  COPD with emphysema History of tobacco use disorder   Plan/Recommendations:  He is outsi We discussed  disease management and progression at length today.   I spent *** minutes in the care of this patient today including pre-charting, chart review, review of results, face-to-face care, coordination of care and communication with consultants etc.).  99202  15-29 minutes or Straightforward MDM  40981 30-44 minutes or Low level MDM  19147 45-59 minutes or Moderate level MDM  [id number] 60-74 minutes or High level MDM   Return to Care: No follow-ups on file.  Durel Salts, MD Pulmonary and Critical Care Medicine [facility] Office:[phone]  CC: Dorothyann Peng, MD

## 2023-01-14 NOTE — Patient Instructions (Addendum)
Please follow up with me on an as needed basis.   You can try Delsym cough syrup at night to help you sleep.   You have some emphysema and it doesn't appear to be causing you many problems.  Please come back sooner if you have questions or problems with your breathing.  You have some small spots in your lungs that are not big enough to be worrisome. They do not need further follow up.

## 2023-01-14 NOTE — Progress Notes (Signed)
Alec Solis    [id number]    [birth year]  Primary Care Physician:Sanders, Melina Schools, MD  Referring Physician: Dorothyann Peng, MD [address] [place],  [place] [address] Reason for Consultation: emphysema Date of Consultation: 01/14/2023  Chief complaint:   Chief Complaint  Patient presents with   Consult    Has had emphysema for over 20 years.  Does not take any medication to treat it.  He gets sob at time, but states it is not bad.     HPI:  Alec Solis is a 89 y.o. man who presents for new patient evaluation for emphysema, referred by his PCP Dr. Allyne Gee. Had a CT Chest in Feb 2024 which showed sub 4mm pulmonary nodules and centrilobular and paraseptal emphysema with bronchiectasis.   Diagnosed with emphysema 20 years ago when living in [address]    He denies difficulty with his breathing, no chest tightness, wheezing or coughing.    Had recent contacts with patients with strep throat. He is bringing up some mucus.    Denies recurrent bronchitis or pneumonia. No childhood respiratory disease.    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  Exposures: lives independently, wife passed away.  Smoking history: 50 years x 0.5 ppd = 25 pack years.     Social History   Occupational History   Occupation: retired  Tobacco Use   Smoking status: Former    Packs/day: 0.50    Years: 50.00    Additional pack years: 0.00    Total pack years: 25.00    Types: Cigarettes    Start date: 08/24/1952    Quit date: 08/24/2014    Years since quitting: 8.3   Smokeless tobacco: Never  Vaping Use   Vaping Use: Never used  Substance and Sexual Activity   Alcohol use: Yes    Comment: occassional wine beer   Drug use: No   Sexual activity: Not Currently    Relevant family history:  Family History  Problem Relation Age of Onset   Breast cancer Mother    Stomach  cancer Mother    Diabetes Mellitus II Maternal Aunt    Lung cancer Neg Hx    Lung disease Neg Hx     Past Medical History:  Diagnosis Date   Anemia    Anxiety    Arthritis    Cataract    Chronic kidney disease    Depression    Diabetes mellitus without complication (HCC)    type II    GERD (gastroesophageal reflux disease)    Gout    Heart murmur    Hypertension    Myocardial infarction (HCC)    minor Mi- years ago -    Varicose veins of left lower extremity     Past Surgical History:  Procedure Laterality Date   BUNIONECTOMY     cataract     COLONOSCOPY WITH PROPOFOL N/A 05/28/2017   Procedure: COLONOSCOPY WITH PROPOFOL;  Surgeon: Jeani Hawking, MD;  Location: [facility];  Service: Endoscopy;  Laterality: N/A;   COLONOSCOPY WITH PROPOFOL N/A 03/29/2020   Procedure: COLONOSCOPY WITH PROPOFOL;  Surgeon: Jeani Hawking, MD;  Location: [facility];  Service: Endoscopy;  Laterality: N/A;   ENTEROSCOPY N/A 03/29/2020   Procedure: ENTEROSCOPY;  Surgeon: Jeani Hawking, MD;  Location: [facility];  Service: Endoscopy;  Laterality: N/A;   ESOPHAGOGASTRODUODENOSCOPY (EGD)  WITH PROPOFOL N/A 05/28/2017   Procedure: ESOPHAGOGASTRODUODENOSCOPY (EGD) WITH PROPOFOL;  Surgeon: Jeani Hawking, MD;  Location: [facility];  Service: Endoscopy;  Laterality: N/A;   HEMOSTASIS CLIP PLACEMENT  03/29/2020   Procedure: HEMOSTASIS CLIP PLACEMENT;  Surgeon: Jeani Hawking, MD;  Location: [facility];  Service: Endoscopy;;   HOT HEMOSTASIS N/A 03/29/2020   Procedure: HOT HEMOSTASIS (ARGON PLASMA COAGULATION/BICAP);  Surgeon: Jeani Hawking, MD;  Location: [facility];  Service: Endoscopy;  Laterality: N/A;   POLYPECTOMY  03/29/2020   Procedure: POLYPECTOMY;  Surgeon: Jeani Hawking, MD;  Location: [facility];  Service: Endoscopy;;     Physical Exam: Blood pressure 120/80, pulse 91, temperature 99.1 F (37.3 C), temperature source Oral, height 5\' 10"  (1.778 m), weight 179 lb 6.4 oz (81.4 kg), SpO2 99  %. Gen:      No acute distress ENT:  no nasal polyps, mucus membranes moist, no tonsillar exudates Lungs:    No increased respiratory effort, symmetric chest wall excursion, clear to auscultation bilaterally, no wheezes or crackles CV:         Regular rate and rhythm; no murmurs, rubs, or gallops.  No pedal edema Abd:      + bowel sounds; soft, non-tender; no distension MSK: no acute synovitis of DIP or PIP joints, no mechanics hands.  Skin:      Warm and dry; no rashes Neuro: normal speech, no focal facial asymmetry Psych: alert and oriented x3, normal mood and affect   Data Reviewed/Medical Decision Making:  Independent interpretation of tests: Imaging:  Review of patient's ct chest feb 2024 reviewed images revealed paraseptal and centrilobular emphysema, no significant pulmonary nodules over 4mm. The patient's images have been independently reviewed by me.    PFTs:  Labs:  Lab Results  Component Value Date   NA 141 09/17/2022   K 5.9 (H) 09/17/2022   CO2 20 09/17/2022   GLUCOSE 144 (H) 09/17/2022   BUN 30 (H) 09/17/2022   CREATININE 2.23 (H) 09/17/2022   CALCIUM 10.1 09/17/2022   EGFR 28 (L) 09/17/2022   GFRNONAA 27 (L) 10/01/2020   Lab Results  Component Value Date   WBC 6.5 09/17/2022   HGB 10.7 (L) 09/17/2022   HCT 35.1 (L) 09/17/2022   MCV 74 (L) 09/17/2022   PLT 228 09/17/2022     Immunization status:  Immunization History  Administered Date(s) Administered   Fluad Quad(high Dose 65+) 05/18/2022   Influenza, High Dose Seasonal PF 05/25/2018, 04/19/2019   Influenza-Unspecified 05/24/2013, 05/31/2018, 05/07/2021   PFIZER(Purple Top)SARS-COV-2 Vaccination 10/23/2019, 11/15/2019, 09/12/2020   PNEUMOCOCCAL CONJUGATE-20 10/30/2021   Pfizer Covid-19 Vaccine Bivalent Booster 29yrs & up 05/29/2021   Pneumococcal Polysaccharide-23 05/24/2013, 05/24/2017   Tdap 05/22/2013   Zoster Recombinat (Shingrix) 06/19/2022     I reviewed prior external note(s) from pcp  I  reviewed the result(s) of the labs and imaging as noted above.   I have ordered    Assessment:  Centrilobular emphysema Multiple pulmonary nodules, sub 4mm URI History of toabcco use.  Plan/Recommendations:  He is relatively asymptomatic from his COPD. No daily symptoms or recurrent infections. 4mm nodules based on fleischner society guideliens require no further follow up.  He has a mild URI today - no evidence of strep throat. Recommend delsym cough syrup at night, he declined hydromet.  I am happy to see him back as needed.  He is above the age to qualify or LDCT for lung cancer screening.   We discussed disease management and progression at length today.  Fleischner Society Guidelines 2017 MacMahon H, Naidich DP, Goo JM, et al. Guidelines for management of incidental pulmonary nodules detected on CT Images: From the Fleischner Society. Radiology 2017; [id number]. Copyright  2017 Radiological Society of [place].  Evaluation of the incidental solid pulmonary nodule in adults Nodule size (mm) Low (<5%) cancer risk High (>65%) or moderate (5 to 65%) cancer risk  Solitary  <6 No routine follow-up Optional CT at 12 months  6 to 8 CT at 6 to 12 months, then consider CT at 18 to 24 months CT at 6 to 12 months, then CT at 18 to 24 months  >8 CT at 3 months, then at 9 and 24 months FDG PET/CT, biopsy or resection  Multiple (evaluation based on largest nodule)  <6 No routine follow-up Optional CT at 12 months  ?6 CT at 3 to 6 months, then consider CT at 18 to 24 months CT at 3 to 6 months, then CT at 18 to 24 months   Not applicable to patients age <35 years, in lung cancer screening, with immunosuppression, known pulmonary disease or symptoms or active primary cancer. Chest CT performed without contrast as contiguous 1 mm sections using low dose technique. Growing or FDG-avid nodules should undergo biopsy or resection. Growth is defined as >1.5 mm increase. Nodules unchanged  for >2 years are benign.   CT: computed tomography; FDG: 18-fluorodeoxyglucose; PET: positron emission tomography.   Return to Care: Return if symptoms worsen or fail to improve.  Durel Salts, MD Pulmonary and Critical Care Medicine [facility] Office:[phone]  CC: Dorothyann Peng, MD

## 2023-01-15 ENCOUNTER — Other Ambulatory Visit: Payer: Self-pay

## 2023-01-15 DIAGNOSIS — E782 Mixed hyperlipidemia: Secondary | ICD-10-CM

## 2023-01-15 MED ORDER — ROSUVASTATIN CALCIUM 20 MG PO TABS
ORAL_TABLET | ORAL | 1 refills | Status: DC
Start: 2023-01-15 — End: 2023-03-29

## 2023-02-06 ENCOUNTER — Other Ambulatory Visit: Payer: Self-pay | Admitting: Nurse Practitioner

## 2023-02-09 ENCOUNTER — Other Ambulatory Visit: Payer: Self-pay | Admitting: Internal Medicine

## 2023-02-12 ENCOUNTER — Other Ambulatory Visit: Payer: Self-pay

## 2023-02-12 MED ORDER — ONDANSETRON HCL 4 MG PO TABS: ORAL_TABLET | ORAL | 0 refills | Status: DC

## 2023-02-16 ENCOUNTER — Encounter: Payer: Self-pay | Admitting: Family Medicine

## 2023-02-16 ENCOUNTER — Ambulatory Visit (INDEPENDENT_AMBULATORY_CARE_PROVIDER_SITE_OTHER): Payer: Medicare Other | Admitting: Family Medicine

## 2023-02-16 VITALS — BP 130/70 | HR 74 | Temp 98.4°F | Ht 70.0 in | Wt 178.0 lb

## 2023-02-16 DIAGNOSIS — E1122 Type 2 diabetes mellitus with diabetic chronic kidney disease: Secondary | ICD-10-CM

## 2023-02-16 DIAGNOSIS — I129 Hypertensive chronic kidney disease with stage 1 through stage 4 chronic kidney disease, or unspecified chronic kidney disease: Secondary | ICD-10-CM

## 2023-02-16 DIAGNOSIS — N1832 Chronic kidney disease, stage 3b: Secondary | ICD-10-CM

## 2023-02-16 DIAGNOSIS — E782 Mixed hyperlipidemia: Secondary | ICD-10-CM

## 2023-02-16 DIAGNOSIS — Z794 Long term (current) use of insulin: Secondary | ICD-10-CM | POA: Diagnosis not present

## 2023-02-16 DIAGNOSIS — R110 Nausea: Secondary | ICD-10-CM

## 2023-02-16 NOTE — Patient Instructions (Addendum)
iabetes Mellitus and Nutrition, Adult When you have diabetes, or diabetes mellitus, it is very important to have healthy eating habits because your blood sugar (glucose) levels are greatly affected by what you eat and drink. Eating healthy foods in the right amounts, at about the same times every day, can help you: Manage your blood glucose. Lower your risk of heart disease. Improve your blood pressure. Reach or maintain a healthy weight. What can affect my meal plan? Every person with diabetes is different, and each person has different needs for a meal plan. Your health care provider may recommend that you work with a dietitian to make a meal plan that is best for you. Your meal plan may vary depending on factors such as: The calories you need. The medicines you take. Your weight. Your blood glucose, blood pressure, and cholesterol levels. Your activity level. Other health conditions you have, such as heart or kidney disease. How do carbohydrates affect me? Carbohydrates, also called carbs, affect your blood glucose level more than any other type of food. Eating carbs raises the amount of glucose in your blood. It is important to know how many carbs you can safely have in each meal. This is different for every person. Your dietitian can help you calculate how many carbs you should have at each meal and for each snack. How does alcohol affect me? Alcohol can cause a decrease in blood glucose (hypoglycemia), especially if you use insulin or take certain diabetes medicines by mouth. Hypoglycemia can be a life-threatening condition. Symptoms of hypoglycemia, such as sleepiness, dizziness, and confusion, are similar to symptoms of having too much alcohol. Do not drink alcohol if: Your health care provider tells you not to drink. You are pregnant, may be pregnant, or are planning to become pregnant. If you drink alcohol: Limit how much you have to: 0-1 drink a day for women. 0-2 drinks a day for  men. Know how much alcohol is in your drink. In the [place] one drink equals one 12 oz bottle of beer (355 mL), one 5 oz glass of wine (148 mL), or one 1 oz glass of hard liquor (44 mL). Keep yourself hydrated with water, diet soda, or unsweetened iced tea. Keep in mind that regular soda, juice, and other mixers may contain a lot of sugar and must be counted as carbs. What are tips for following this plan?  Reading food labels Start by checking the serving size on the Nutrition Facts label of packaged foods and drinks. The number of calories and the amount of carbs, fats, and other nutrients listed on the label are based on one serving of the item. Many items contain more than one serving per package. Check the total grams (g) of carbs in one serving. Check the number of grams of saturated fats and trans fats in one serving. Choose foods that have a low amount or none of these fats. Check the number of milligrams (mg) of salt (sodium) in one serving. Most people should limit total sodium intake to less than 2,300 mg per day. Always check the nutrition information of foods labeled as "low-fat" or "nonfat." These foods may be higher in added sugar or refined carbs and should be avoided. Talk to your dietitian to identify your daily goals for nutrients listed on the label. Shopping Avoid buying canned, pre-made, or processed foods. These foods tend to be high in fat, sodium, and added sugar. Shop around the outside edge of the grocery store. This is where you  will most often find fresh fruits and vegetables, bulk grains, fresh meats, and fresh dairy products. Cooking Use low-heat cooking methods, such as baking, instead of high-heat cooking methods, such as deep frying. Cook using healthy oils, such as olive, canola, or sunflower oil. Avoid cooking with butter, cream, or high-fat meats. Meal planning Eat meals and snacks regularly, preferably at the same times every day. Avoid going long periods of  time without eating. Eat foods that are high in fiber, such as fresh fruits, vegetables, beans, and whole grains. Eat 4-6 oz (112-168 g) of lean protein each day, such as lean meat, chicken, fish, eggs, or tofu. One ounce (oz) (28 g) of lean protein is equal to: 1 oz (28 g) of meat, chicken, or fish. 1 egg.  cup (62 g) of tofu. Eat some foods each day that contain healthy fats, such as avocado, nuts, seeds, and fish. What foods should I eat? Fruits Berries. Apples. Oranges. Peaches. Apricots. Plums. Grapes. Mangoes. Papayas. Pomegranates. Kiwi. Cherries. Vegetables Leafy greens, including lettuce, spinach, kale, chard, collard greens, mustard greens, and cabbage. Beets. Cauliflower. Broccoli. Carrots. Green beans. Tomatoes. Peppers. Onions. Cucumbers. Brussels sprouts. Grains Whole grains, such as whole-wheat or whole-grain bread, crackers, tortillas, cereal, and pasta. Unsweetened oatmeal. Quinoa. Brown or wild rice. Meats and other proteins Seafood. Poultry without skin. Lean cuts of poultry and beef. Tofu. Nuts. Seeds. Dairy Low-fat or fat-free dairy products such as milk, yogurt, and cheese. The items listed above may not be a complete list of foods and beverages you can eat and drink. Contact a dietitian for more information. What foods should I avoid? Fruits Fruits canned with syrup. Vegetables Canned vegetables. Frozen vegetables with butter or cream sauce. Grains Refined white flour and flour products such as bread, pasta, snack foods, and cereals. Avoid all processed foods. Meats and other proteins Fatty cuts of meat. Poultry with skin. Breaded or fried meats. Processed meat. Avoid saturated fats. Dairy Full-fat yogurt, cheese, or milk. Beverages Sweetened drinks, such as soda or iced tea. The items listed above may not be a complete list of foods and beverages you should avoid. Contact a dietitian for more information. Questions to ask a health care provider Do I need to  meet with a certified diabetes care and education specialist? Do I need to meet with a dietitian? What number can I call if I have questions? When are the best times to check my blood glucose? Where to find more information: American Diabetes Association: diabetes.org Academy of Nutrition and Dietetics: eatright.[employer] of Diabetes and Digestive and Kidney Diseases: StageSync.si Association of Diabetes Care & Education Specialists: diabeteseducator.org Summary It is important to have healthy eating habits because your blood sugar (glucose) levels are greatly affected by what you eat and drink. It is important to use alcohol carefully. A healthy meal plan will help you manage your blood glucose and lower your risk of heart disease. Your health care provider may recommend that you work with a dietitian to make a meal plan that is best for you. This information is not intended to replace advice given to you by your health care provider. Make sure you discuss any questions you have with your health care provider. Document Revised: 03/13/2020 Document Reviewed: 03/13/2020 Elsevier Patient Education  2024 [employer].

## 2023-02-16 NOTE — Progress Notes (Signed)
I,Jameka J Llittleton,acting as a Neurosurgeon for [employer], NP.,have documented all relevant documentation on the behalf of Morry Veiga, NP,as directed by  Jaydien Panepinto Moshe Salisbury, NP while in the presence of Antwuan Eckley, NP.   Subjective:  Patient ID: Alec Solis , male    DOB: May 08, 1933 , 89 y.o.   MRN: [id number]  Chief Complaint  Patient presents with   Nausea   Diabetes   Hypertension    HPI  Patient presents today for a diabetes and BP check. Patient reports compliance with his meds. He reports he had nausea  long time ago but he feels better today, patient has GERD also which he take Prilosec for. Patient does not have any other questions or concerns at this time.   Diabetes He presents for his follow-up diabetic visit. He has type 2 diabetes mellitus. His disease course has been improving. There are no hypoglycemic associated symptoms. Pertinent negatives for hypoglycemia include no dizziness or headaches. Pertinent negatives for diabetes include no blurred vision, no chest pain, no fatigue, no polydipsia, no polyphagia and no polyuria. There are no hypoglycemic complications. Symptoms are improving. There are no diabetic complications. Risk factors for coronary artery disease include sedentary lifestyle, male sex, diabetes mellitus and hypertension. Current diabetic treatment includes insulin injections. He is compliant with treatment most of the time. Meal planning includes avoidance of concentrated sweets. He participates in exercise intermittently. His home blood glucose trend is decreasing steadily. (Blood sugars are running lower down to 86, he is currently taking 20 units nightly) Eye exam is current.  Hypertension This is a chronic problem. The current episode started more than 1 year ago. The problem has been gradually improving since onset. The problem is controlled. Pertinent negatives include no blurred vision, chest pain, headaches, palpitations or shortness of breath. Risk factors for  coronary artery disease include diabetes mellitus, dyslipidemia, sedentary lifestyle and male gender. Identifiable causes of hypertension include chronic renal disease.  Hyperlipidemia This is a chronic problem. Exacerbating diseases include chronic renal disease and diabetes. Pertinent negatives include no chest pain or shortness of breath. Current antihyperlipidemic treatment includes statins. Compliance problems include adherence to exercise.  Risk factors for coronary artery disease include diabetes mellitus, dyslipidemia, male sex, hypertension and a sedentary lifestyle.     Past Medical History:  Diagnosis Date   Anemia    Anxiety    Arthritis    Cataract    Chronic kidney disease    Depression    Diabetes mellitus without complication (HCC)    type II    GERD (gastroesophageal reflux disease)    Gout    Heart murmur    Hypertension    Myocardial infarction (HCC)    minor Mi- years ago -    Varicose veins of left lower extremity      Family History  Problem Relation Age of Onset   Breast cancer Mother    Stomach cancer Mother    Diabetes Mellitus II Maternal Aunt    Lung cancer Neg Hx    Lung disease Neg Hx      Current Outpatient Medications:    acetaminophen (TYLENOL) 650 MG CR tablet, Take 650 mg by mouth every 8 (eight) hours as needed for pain. , Disp: , Rfl:    allopurinol (ZYLOPRIM) 100 MG tablet, Take 1 tablet (100 mg total) by mouth 2 (two) times daily., Disp: 180 tablet, Rfl: 0   amLODipine (NORVASC) 5 MG tablet, TAKE 1 TABLET(5 MG) BY MOUTH DAILY, Disp: 90 tablet,  Rfl: 1   aspirin 81 MG tablet, Take 81 mg by mouth daily. , Disp: , Rfl:    B-D ULTRAFINE III SHORT PEN 31G X 8 MM MISC, USE AS DIRECTED FOUR TIMES DAILY, Disp: 100 each, Rfl: 3   calcitRIOL (ROCALTROL) 0.25 MCG capsule, Take 0.25 mcg by mouth every other day. , Disp: , Rfl:    carboxymethylcellulose (REFRESH PLUS) 0.5 % SOLN, Place 1 drop into both eyes 3 (three) times daily as needed (Watery  eyes). , Disp: , Rfl:    carvedilol (COREG) 12.5 MG tablet, Take 12.5 mg by mouth 2 (two) times daily with a meal., Disp: , Rfl:    ferrous sulfate 325 (65 FE) MG tablet, Take 325 mg by mouth daily with breakfast., Disp: , Rfl:    fluticasone (FLONASE) 50 MCG/ACT nasal spray, Place 2 sprays into both nostrils daily., Disp: 16 g, Rfl: 2   furosemide (LASIX) 40 MG tablet, Take 40 mg by mouth daily as needed for fluid or edema. , Disp: , Rfl:    glucose blood (TRUE METRIX BLOOD GLUCOSE TEST) test strip, Use as directed to check blood sugars 3 times per day dx: e11.65, Disp: 300 each, Rfl: 5   insulin degludec (TRESIBA FLEXTOUCH) 200 UNIT/ML FlexTouch Pen, Inject 18 Units into the skin daily., Disp: 27 mL, Rfl: 1   Insulin Pen Needle 32G X 8 MM MISC, Use as directed, Disp: 100 each, Rfl: 2   Omega-3 Fatty Acids (FISH OIL OMEGA-3 PO), Take 1,200 mg by mouth daily. , Disp: , Rfl:    omeprazole (PRILOSEC) 40 MG capsule, Take 1 capsule (40 mg total) by mouth daily., Disp: 90 capsule, Rfl: 1   ondansetron (ZOFRAN) 4 MG tablet, TAKE 1 TABLET(4 MG) BY MOUTH DAILY AS NEEDED FOR NAUSEA OR VOMITING, Disp: 30 tablet, Rfl: 0   rosuvastatin (CRESTOR) 20 MG tablet, TAKE 1 TABLET BY MOUTH DAILY ON MONDAY TO FRIDAY, Disp: 75 tablet, Rfl: 1   sodium bicarbonate 650 MG tablet, Take 1,300 mg by mouth 2 (two) times daily., Disp: , Rfl:    TRUEplus Lancets 30G MISC, USE AS DIRECTED TO TEST BLOOD SUGAR THREE TIMES DAILY, Disp: 300 each, Rfl: 11   vitamin B-12 (CYANOCOBALAMIN) 1000 MCG tablet, Take 1,000 mcg by mouth daily., Disp: , Rfl:    No Known Allergies   Review of Systems  Constitutional: Negative.  Negative for fatigue.  HENT: Negative.    Eyes:  Negative for blurred vision.  Respiratory:  Negative for shortness of breath.   Cardiovascular:  Negative for chest pain and palpitations.  Endocrine: Negative for polydipsia, polyphagia and polyuria.  Neurological:  Negative for dizziness and headaches.      Today's Vitals   02/16/23 1602 02/16/23 1708  BP: (!) 140/68 130/70  Pulse: 74   Temp: 98.4 F (36.9 C)   Weight: 178 lb (80.7 kg)   Height: 5\' 10"  (1.778 m)   PainSc: 0-No pain    Body mass index is 25.54 kg/m.  Wt Readings from Last 3 Encounters:  02/16/23 178 lb (80.7 kg)  01/14/23 179 lb 6.4 oz (81.4 kg)  09/17/22 177 lb 12.8 oz (80.6 kg)     Objective:  Physical Exam Cardiovascular:     Rate and Rhythm: Regular rhythm.  Musculoskeletal:        General: Swelling present.  Skin:    General: Skin is warm and dry.  Neurological:     General: No focal deficit present.     Mental Status:  He is oriented to person, place, and time.         Assessment And Plan:  Type 2 diabetes mellitus with stage 3b chronic kidney disease, with long-term current use of insulin (HCC) Assessment & Plan: Chronic. Continue current treatment, last A1c 7.2  Orders: -     Hemoglobin A1c  Hypertensive nephropathy Assessment & Plan: Continue Amlodipine ad Coreg daily  Orders: -     CBC -     CMP14+EGFR  Mixed hyperlipidemia Assessment & Plan: LDL goal < 70  Orders: -     Lipid panel    IF YOU HAVE BEEN REFERRED TO A SPECIALIST, IT MAY TAKE 1-2 WEEKS TO SCHEDULE/PROCESS THE REFERRAL. IF YOU HAVE NOT HEARD FROM US/SPECIALIST IN TWO WEEKS, PLEASE GIVE [place] A CALL AT [phone] X [phone].   THE PATIENT IS ENCOURAGED TO PRACTICE SOCIAL DISTANCING DUE TO THE COVID-19 PANDEMIC.

## 2023-02-17 LAB — CBC
Hematocrit: 31.9 % — ABNORMAL LOW (ref 37.5–51.0)
Hemoglobin: 9.9 g/dL — ABNORMAL LOW (ref 13.0–17.7)
MCH: 23 pg — ABNORMAL LOW (ref 26.6–33.0)
MCHC: 31 g/dL — ABNORMAL LOW (ref 31.5–35.7)
MCV: 74 fL — ABNORMAL LOW (ref 79–97)
NRBC: 3 % — ABNORMAL HIGH (ref 0–0)
Platelets: 273 10*3/uL (ref 150–450)
RBC: 4.3 x10E6/uL (ref 4.14–5.80)
RDW: 18 % — ABNORMAL HIGH (ref 11.6–15.4)
WBC: 7.1 10*3/uL (ref 3.4–10.8)

## 2023-02-17 LAB — CMP14+EGFR
ALT: 11 IU/L (ref 0–44)
AST: 16 IU/L (ref 0–40)
Albumin: 4.2 g/dL (ref 3.7–4.7)
Alkaline Phosphatase: 135 IU/L — ABNORMAL HIGH (ref 44–121)
BUN/Creatinine Ratio: 16 (ref 10–24)
BUN: 34 mg/dL — ABNORMAL HIGH (ref 8–27)
Bilirubin Total: <0.2 mg/dL (ref 0.0–1.2)
CO2: 21 mmol/L (ref 20–29)
Calcium: 10.1 mg/dL (ref 8.6–10.2)
Chloride: 106 mmol/L (ref 96–106)
Creatinine, Ser: 2.08 mg/dL — ABNORMAL HIGH (ref 0.76–1.27)
Globulin, Total: 3.6 g/dL (ref 1.5–4.5)
Glucose: 118 mg/dL — ABNORMAL HIGH (ref 70–99)
Potassium: 5 mmol/L (ref 3.5–5.2)
Sodium: 142 mmol/L (ref 134–144)
Total Protein: 7.8 g/dL (ref 6.0–8.5)
eGFR: 30 mL/min/{1.73_m2} — ABNORMAL LOW (ref >59)

## 2023-02-17 LAB — LIPID PANEL
Chol/HDL Ratio: 3.6 ratio (ref 0.0–5.0)
Cholesterol, Total: 90 mg/dL — ABNORMAL LOW (ref 100–199)
HDL: 25 mg/dL — ABNORMAL LOW (ref >39)
LDL Chol Calc (NIH): 38 mg/dL (ref 0–99)
Triglycerides: 161 mg/dL — ABNORMAL HIGH (ref 0–149)
VLDL Cholesterol Cal: 27 mg/dL (ref 5–40)

## 2023-02-17 LAB — HEMOGLOBIN A1C
Est. average glucose Bld gHb Est-mCnc: 171 mg/dL
Hgb A1c MFr Bld: 7.6 % — ABNORMAL HIGH (ref 4.8–5.6)

## 2023-02-22 NOTE — Assessment & Plan Note (Signed)
LDL goal < 70 

## 2023-02-22 NOTE — Assessment & Plan Note (Signed)
Continue Amlodipine ad Coreg daily

## 2023-02-22 NOTE — Assessment & Plan Note (Signed)
Chronic. Continue current treatment, last A1c 7.2

## 2023-03-24 ENCOUNTER — Ambulatory Visit (INDEPENDENT_AMBULATORY_CARE_PROVIDER_SITE_OTHER): Payer: Medicare Other | Admitting: Podiatry

## 2023-03-24 ENCOUNTER — Encounter: Payer: Self-pay | Admitting: Podiatry

## 2023-03-24 VITALS — BP 156/84

## 2023-03-24 DIAGNOSIS — M79675 Pain in left toe(s): Secondary | ICD-10-CM

## 2023-03-24 DIAGNOSIS — M79674 Pain in right toe(s): Secondary | ICD-10-CM | POA: Diagnosis not present

## 2023-03-24 DIAGNOSIS — N183 Chronic kidney disease, stage 3 unspecified: Secondary | ICD-10-CM | POA: Diagnosis not present

## 2023-03-24 DIAGNOSIS — L84 Corns and callosities: Secondary | ICD-10-CM

## 2023-03-24 DIAGNOSIS — Q828 Other specified congenital malformations of skin: Secondary | ICD-10-CM | POA: Diagnosis not present

## 2023-03-24 DIAGNOSIS — B351 Tinea unguium: Secondary | ICD-10-CM

## 2023-03-24 DIAGNOSIS — E1122 Type 2 diabetes mellitus with diabetic chronic kidney disease: Secondary | ICD-10-CM

## 2023-03-25 ENCOUNTER — Ambulatory Visit: Payer: Medicare Other | Admitting: Internal Medicine

## 2023-03-25 NOTE — Progress Notes (Deleted)
I, T Deloria Lair, CMA,acting as a Neurosurgeon for Gwynneth Aliment, MD.,have documented all relevant documentation on the behalf of Gwynneth Aliment, MD,as directed by  Gwynneth Aliment, MD while in the presence of Gwynneth Aliment, MD.  Subjective:  Patient ID: Alec Solis , male    DOB: 1933/04/18 , 89 y.o.   MRN: [id number]  No chief complaint on file.   HPI  Patient presents today for a diabetes and BP check. Patient reports compliance with his meds. Patient does not have any other questions or concerns at this time. Denies headache, chest pain, SOB.   Diabetes He presents for his follow-up diabetic visit. He has type 2 diabetes mellitus. His disease course has been improving. There are no hypoglycemic associated symptoms. Pertinent negatives for hypoglycemia include no dizziness or headaches. Pertinent negatives for diabetes include no blurred vision, no chest pain, no fatigue, no polydipsia, no polyphagia and no polyuria. There are no hypoglycemic complications. Symptoms are improving. There are no diabetic complications. Risk factors for coronary artery disease include sedentary lifestyle, male sex, diabetes mellitus and hypertension. Current diabetic treatment includes insulin injections. He is compliant with treatment most of the time. Meal planning includes avoidance of concentrated sweets. He participates in exercise intermittently. His home blood glucose trend is decreasing steadily. (Blood sugars are running lower down to 86, he is currently taking 20 units nightly) Eye exam is current.  Hypertension This is a chronic problem. The current episode started more than 1 year ago. The problem has been gradually improving since onset. The problem is controlled. Pertinent negatives include no blurred vision, chest pain, headaches, palpitations or shortness of breath. Risk factors for coronary artery disease include diabetes mellitus, dyslipidemia, sedentary lifestyle and male gender. Identifiable  causes of hypertension include chronic renal disease.  Hyperlipidemia This is a chronic problem. Exacerbating diseases include chronic renal disease and diabetes. Pertinent negatives include no chest pain or shortness of breath. Current antihyperlipidemic treatment includes statins. Compliance problems include adherence to exercise.  Risk factors for coronary artery disease include diabetes mellitus, dyslipidemia, male sex, hypertension and a sedentary lifestyle.     Past Medical History:  Diagnosis Date   Anemia    Anxiety    Arthritis    Cataract    Chronic kidney disease    Depression    Diabetes mellitus without complication (HCC)    type II    GERD (gastroesophageal reflux disease)    Gout    Heart murmur    Hypertension    Myocardial infarction (HCC)    minor Mi- years ago -    Varicose veins of left lower extremity      Family History  Problem Relation Age of Onset   Breast cancer Mother    Stomach cancer Mother    Diabetes Mellitus II Maternal Aunt    Lung cancer Neg Hx    Lung disease Neg Hx      Current Outpatient Medications:    acetaminophen (TYLENOL) 650 MG CR tablet, Take 650 mg by mouth every 8 (eight) hours as needed for pain. , Disp: , Rfl:    allopurinol (ZYLOPRIM) 100 MG tablet, Take 1 tablet (100 mg total) by mouth 2 (two) times daily., Disp: 180 tablet, Rfl: 0   amLODipine (NORVASC) 5 MG tablet, TAKE 1 TABLET(5 MG) BY MOUTH DAILY, Disp: 90 tablet, Rfl: 1   aspirin 81 MG tablet, Take 81 mg by mouth daily. , Disp: , Rfl:    B-D ULTRAFINE III SHORT  PEN 31G X 8 MM MISC, USE AS DIRECTED FOUR TIMES DAILY, Disp: 100 each, Rfl: 3   calcitRIOL (ROCALTROL) 0.25 MCG capsule, Take 0.25 mcg by mouth every other day. , Disp: , Rfl:    carboxymethylcellulose (REFRESH PLUS) 0.5 % SOLN, Place 1 drop into both eyes 3 (three) times daily as needed (Watery eyes). , Disp: , Rfl:    carvedilol (COREG) 12.5 MG tablet, Take 12.5 mg by mouth 2 (two) times daily with a meal.,  Disp: , Rfl:    ferrous sulfate 325 (65 FE) MG tablet, Take 325 mg by mouth daily with breakfast., Disp: , Rfl:    fluticasone (FLONASE) 50 MCG/ACT nasal spray, Place 2 sprays into both nostrils daily., Disp: 16 g, Rfl: 2   furosemide (LASIX) 40 MG tablet, Take 40 mg by mouth daily as needed for fluid or edema. , Disp: , Rfl:    glucose blood (TRUE METRIX BLOOD GLUCOSE TEST) test strip, Use as directed to check blood sugars 3 times per day dx: e11.65, Disp: 300 each, Rfl: 5   insulin degludec (TRESIBA FLEXTOUCH) 200 UNIT/ML FlexTouch Pen, Inject 18 Units into the skin daily., Disp: 27 mL, Rfl: 1   Insulin Pen Needle 32G X 8 MM MISC, Use as directed, Disp: 100 each, Rfl: 2   Omega-3 Fatty Acids (FISH OIL OMEGA-3 PO), Take 1,200 mg by mouth daily. , Disp: , Rfl:    omeprazole (PRILOSEC) 40 MG capsule, Take 1 capsule (40 mg total) by mouth daily., Disp: 90 capsule, Rfl: 1   ondansetron (ZOFRAN) 4 MG tablet, TAKE 1 TABLET(4 MG) BY MOUTH DAILY AS NEEDED FOR NAUSEA OR VOMITING, Disp: 30 tablet, Rfl: 0   rosuvastatin (CRESTOR) 20 MG tablet, TAKE 1 TABLET BY MOUTH DAILY ON MONDAY TO FRIDAY, Disp: 75 tablet, Rfl: 1   sodium bicarbonate 650 MG tablet, Take 1,300 mg by mouth 2 (two) times daily., Disp: , Rfl:    TRUEplus Lancets 30G MISC, USE AS DIRECTED TO TEST BLOOD SUGAR THREE TIMES DAILY, Disp: 300 each, Rfl: 11   vitamin B-12 (CYANOCOBALAMIN) 1000 MCG tablet, Take 1,000 mcg by mouth daily., Disp: , Rfl:    No Known Allergies   Review of Systems  Constitutional: Negative.  Negative for fatigue.  Eyes:  Negative for blurred vision.  Respiratory: Negative.  Negative for shortness of breath.   Cardiovascular: Negative.  Negative for chest pain and palpitations.  Endocrine: Negative.  Negative for polydipsia, polyphagia and polyuria.  Skin: Negative.   Allergic/Immunologic: Negative.   Neurological:  Negative for dizziness and headaches.  Hematological: Negative.      There were no vitals filed for  this visit. There is no height or weight on file to calculate BMI.  Wt Readings from Last 3 Encounters:  02/16/23 178 lb (80.7 kg)  01/14/23 179 lb 6.4 oz (81.4 kg)  09/17/22 177 lb 12.8 oz (80.6 kg)     Objective:  Physical Exam      Assessment And Plan:  There are no diagnoses linked to this encounter.   No follow-ups on file.  Patient was given opportunity to ask questions. Patient verbalized understanding of the plan and was able to repeat key elements of the plan. All questions were answered to their satisfaction.  Gwynneth Aliment, MD  I, Gwynneth Aliment, MD, have reviewed all documentation for this visit. The documentation on 03/25/23 for the exam, diagnosis, procedures, and orders are all accurate and complete.   IF YOU HAVE BEEN REFERRED TO  A SPECIALIST, IT MAY TAKE 1-2 WEEKS TO SCHEDULE/PROCESS THE REFERRAL. IF YOU HAVE NOT HEARD FROM US/SPECIALIST IN TWO WEEKS, PLEASE GIVE [place] A CALL AT [phone].   THE PATIENT IS ENCOURAGED TO PRACTICE SOCIAL DISTANCING DUE TO THE COVID-19 PANDEMIC.

## 2023-03-28 NOTE — Progress Notes (Signed)
  Subjective:  Patient ID: Alec Solis, male    DOB: 10-21-1932,  MRN: [id number]  Alec Solis presents to clinic today for at risk foot care. Pt has h/o NIDDM with chronic kidney disease and corn(s)  L 2nd toe, porokeratotic lesion(s) both feet and painful mycotic nails. Painful toenails interfere with ambulation. Aggravating factors include wearing enclosed shoe gear. Pain is relieved with periodic professional debridement. Painful corns and porokeratotic lesion(s) aggravated when weightbearing with and without shoegear. Pain is relieved with periodic professional debridement.  Chief Complaint  Patient presents with   Nail Problem    dfc   New problem(s): None.   PCP is Dorothyann Peng, MD.  No Known Allergies  Review of Systems: Negative except as noted in the HPI.  Objective: No changes noted in today's physical examination. Vitals:   03/24/23 1038  BP: (!) 156/84   Alec Solis is a pleasant 89 y.o. male WD, WN in NAD. AAO x 3. Vascular Examination: CFT <3 seconds b/l. DP/PT pulses faintly palpable b/l. Skin temperature gradient warm to warm b/l. No pain with calf compression. No ischemia or gangrene. No cyanosis or clubbing noted b/l. Pedal hair absent.   Neurological Examination: Sensation grossly intact b/l with 10 gram monofilament. Vibratory sensation intact b/l.   Dermatological Examination: Pedal skin warm and supple b/l.   No open wounds. No interdigital macerations.  Toenails 1-5 b/l thick, discolored, elongated with subungual debris and pain on dorsal palpation.    Hyperkeratotic lesion(s) L 2nd toe.  No erythema, no edema, no drainage, no fluctuance. Porokeratotic lesion(s) right great toe and submet head 4 left foot. No erythema, no edema, no drainage, no fluctuance.  Musculoskeletal Examination: Normal muscle strength 5/5 to all lower extremity muscle groups bilaterally. HAV with bunion deformity noted b/l LE. Hammertoe deformity noted 2-5 b/l.Marland Kitchen No pain,  crepitus or joint limitation noted with ROM b/l LE.  Patient ambulates independently without assistive aids.  Radiographs: None  Last A1c:      Latest Ref Rng & Units 02/16/2023    4:59 PM 09/17/2022    9:58 AM 05/18/2022    9:53 AM  Hemoglobin A1C  Hemoglobin-A1c 4.8 - 5.6 % 7.6  7.2  7.2    Assessment/Plan: 1. Pain due to onychomycosis of toenails of both feet   2. Corns   3. Porokeratosis   4. Diabetes mellitus with stage 3 chronic kidney disease (HCC)    -Patient was evaluated and treated. All patient's and/or POA's questions/concerns answered on today's visit. -Mycotic toenails 1-5 bilaterally were debrided in length and girth with sterile nail nippers and dremel without incident. -Corn(s) L 2nd toe pared utilizing sterile scalpel blade without complication or incident. Total number debrided=1. -Porokeratotic lesion(s) right great toe and submet head 4 left foot pared and enucleated with sterile currette without incident. Total number of lesions debrided=2. -Patient/POA to call should there be question/concern in the interim.   Return in about 3 months (around 06/24/2023).  Freddie Breech, [name]

## 2023-03-29 ENCOUNTER — Encounter: Payer: Self-pay | Admitting: Family Medicine

## 2023-03-29 ENCOUNTER — Ambulatory Visit (INDEPENDENT_AMBULATORY_CARE_PROVIDER_SITE_OTHER): Payer: Medicare Other | Admitting: Family Medicine

## 2023-03-29 VITALS — BP 110/60 | HR 79 | Temp 98.4°F | Ht 70.0 in | Wt 184.0 lb

## 2023-03-29 DIAGNOSIS — N1832 Chronic kidney disease, stage 3b: Secondary | ICD-10-CM

## 2023-03-29 DIAGNOSIS — E782 Mixed hyperlipidemia: Secondary | ICD-10-CM

## 2023-03-29 DIAGNOSIS — M1A4710 Other secondary chronic gout, right ankle and foot, without tophus (tophi): Secondary | ICD-10-CM

## 2023-03-29 DIAGNOSIS — I129 Hypertensive chronic kidney disease with stage 1 through stage 4 chronic kidney disease, or unspecified chronic kidney disease: Secondary | ICD-10-CM | POA: Diagnosis not present

## 2023-03-29 DIAGNOSIS — K219 Gastro-esophageal reflux disease without esophagitis: Secondary | ICD-10-CM

## 2023-03-29 DIAGNOSIS — Z23 Encounter for immunization: Secondary | ICD-10-CM

## 2023-03-29 DIAGNOSIS — E1122 Type 2 diabetes mellitus with diabetic chronic kidney disease: Secondary | ICD-10-CM | POA: Diagnosis not present

## 2023-03-29 DIAGNOSIS — Z794 Long term (current) use of insulin: Secondary | ICD-10-CM

## 2023-03-29 DIAGNOSIS — E162 Hypoglycemia, unspecified: Secondary | ICD-10-CM | POA: Diagnosis not present

## 2023-03-29 MED ORDER — ALLOPURINOL 100 MG PO TABS
100.0000 mg | ORAL_TABLET | Freq: Two times a day (BID) | ORAL | 1 refills | Status: DC
Start: 2023-03-29 — End: 2024-03-14

## 2023-03-29 MED ORDER — OMEPRAZOLE 40 MG PO CPDR
40.0000 mg | DELAYED_RELEASE_CAPSULE | Freq: Every day | ORAL | 1 refills | Status: DC
Start: 2023-03-29 — End: 2023-07-19

## 2023-03-29 MED ORDER — ZOSTER VAC RECOMB ADJUVANTED 50 MCG/0.5ML IM SUSR
0.5000 mL | Freq: Once | INTRAMUSCULAR | 0 refills | Status: AC
Start: 2023-03-29 — End: 2023-03-29

## 2023-03-29 MED ORDER — GVOKE HYPOPEN 2-PACK 0.5 MG/0.1ML ~~LOC~~ SOAJ
0.1000 mL | SUBCUTANEOUS | 1 refills | Status: DC | PRN
Start: 2023-03-29 — End: 2023-03-29

## 2023-03-29 MED ORDER — AMLODIPINE BESYLATE 5 MG PO TABS
ORAL_TABLET | ORAL | 1 refills | Status: DC
Start: 2023-03-29 — End: 2023-12-23

## 2023-03-29 MED ORDER — GVOKE HYPOPEN 2-PACK 0.5 MG/0.1ML ~~LOC~~ SOAJ
0.1000 mL | SUBCUTANEOUS | 1 refills | Status: AC | PRN
Start: 2023-03-29 — End: ?

## 2023-03-29 MED ORDER — ROSUVASTATIN CALCIUM 20 MG PO TABS
ORAL_TABLET | ORAL | 1 refills | Status: DC
Start: 2023-03-29 — End: 2023-05-19

## 2023-03-29 NOTE — Progress Notes (Addendum)
I,Jameka J Llittleton, CMA,acting as a Neurosurgeon for [employer], NP.,have documented all relevant documentation on the behalf of Jaesean Litzau, NP,as directed by  Shakti Fleer Moshe Salisbury, NP while in the presence of Aslee Such, NP.  Subjective:  Patient ID: Alec Solis , male    DOB: 10-23-1932 , 89 y.o.   MRN: [id number]  Chief Complaint  Patient presents with   Diabetes   Hypertension    HPI  Patient states his BS was 68 on July 4th , states also that in June he had an episode where it was 71 and he was sweaty and clammy but usually his BS are in the 200s.  Patient reports compliance with his meds. Patient does not have any other questions or concerns at this time. Denies headache, chest pain, SOB.   Diabetes He presents for his follow-up diabetic visit. He has type 2 diabetes mellitus. His disease course has been improving. Hypoglycemia symptoms include hunger and sweats. Pertinent negatives for hypoglycemia include no dizziness or headaches. Pertinent negatives for diabetes include no blurred vision, no chest pain, no fatigue, no polydipsia, no polyphagia and no polyuria. Hypoglycemia complications include nocturnal hypoglycemia. Pertinent negatives for hypoglycemia complications include no blackouts and no hospitalization. Symptoms are improving. Diabetic complications include nephropathy. Risk factors for coronary artery disease include sedentary lifestyle, male sex, diabetes mellitus, hypertension and dyslipidemia. Current diabetic treatment includes insulin injections. He is compliant with treatment most of the time. Meal planning includes avoidance of concentrated sweets. He participates in exercise intermittently. His home blood glucose trend is decreasing steadily. (Blood sugars are running lower down to 86, he is currently taking 20 units nightly) Eye exam is current.  Hypertension This is a chronic problem. The current episode started more than 1 year ago. The problem has been gradually improving  since onset. The problem is controlled. Associated symptoms include sweats. Pertinent negatives include no blurred vision, chest pain, headaches, palpitations or shortness of breath. Risk factors for coronary artery disease include diabetes mellitus, dyslipidemia, sedentary lifestyle and male gender. Identifiable causes of hypertension include chronic renal disease.  Hyperlipidemia This is a chronic problem. Exacerbating diseases include chronic renal disease and diabetes. Pertinent negatives include no chest pain or shortness of breath. Current antihyperlipidemic treatment includes statins. Compliance problems include adherence to exercise.  Risk factors for coronary artery disease include diabetes mellitus, dyslipidemia, male sex, hypertension and a sedentary lifestyle.     Past Medical History:  Diagnosis Date   Anemia    Anxiety    Arthritis    Cataract    Chronic kidney disease    Depression    Diabetes mellitus without complication (HCC)    type II    GERD (gastroesophageal reflux disease)    Gout    Heart murmur    Hypertension    Myocardial infarction (HCC)    minor Mi- years ago -    Varicose veins of left lower extremity      Family History  Problem Relation Age of Onset   Breast cancer Mother    Stomach cancer Mother    Diabetes Mellitus II Maternal Aunt    Lung cancer Neg Hx    Lung disease Neg Hx      Current Outpatient Medications:    acetaminophen (TYLENOL) 650 MG CR tablet, Take 650 mg by mouth every 8 (eight) hours as needed for pain. , Disp: , Rfl:    aspirin 81 MG tablet, Take 81 mg by mouth daily. , Disp: , Rfl:  B-D ULTRAFINE III SHORT PEN 31G X 8 MM MISC, USE AS DIRECTED FOUR TIMES DAILY, Disp: 100 each, Rfl: 3   calcitRIOL (ROCALTROL) 0.25 MCG capsule, Take 0.25 mcg by mouth every other day. , Disp: , Rfl:    carboxymethylcellulose (REFRESH PLUS) 0.5 % SOLN, Place 1 drop into both eyes 3 (three) times daily as needed (Watery eyes). , Disp: , Rfl:     carvedilol (COREG) 12.5 MG tablet, Take 12.5 mg by mouth 2 (two) times daily with a meal., Disp: , Rfl:    ferrous sulfate 325 (65 FE) MG tablet, Take 325 mg by mouth daily with breakfast., Disp: , Rfl:    fluticasone (FLONASE) 50 MCG/ACT nasal spray, Place 2 sprays into both nostrils daily., Disp: 16 g, Rfl: 2   furosemide (LASIX) 40 MG tablet, Take 40 mg by mouth daily as needed for fluid or edema. , Disp: , Rfl:    Glucagon (GVOKE HYPOPEN 2-PACK) 0.5 MG/0.1ML SOAJ, Inject 0.1 mLs into the skin as needed (inject SQ under the skin for BS less than 70)., Disp: 0.2 mL, Rfl: 1   glucose blood (TRUE METRIX BLOOD GLUCOSE TEST) test strip, Use as directed to check blood sugars 3 times per day dx: e11.65, Disp: 300 each, Rfl: 5   insulin degludec (TRESIBA FLEXTOUCH) 200 UNIT/ML FlexTouch Pen, Inject 18 Units into the skin daily., Disp: 27 mL, Rfl: 1   Insulin Pen Needle 32G X 8 MM MISC, Use as directed, Disp: 100 each, Rfl: 2   Omega-3 Fatty Acids (FISH OIL OMEGA-3 PO), Take 1,200 mg by mouth daily. , Disp: , Rfl:    ondansetron (ZOFRAN) 4 MG tablet, TAKE 1 TABLET(4 MG) BY MOUTH DAILY AS NEEDED FOR NAUSEA OR VOMITING, Disp: 30 tablet, Rfl: 0   sodium bicarbonate 650 MG tablet, Take 1,300 mg by mouth 2 (two) times daily., Disp: , Rfl:    TRUEplus Lancets 30G MISC, USE AS DIRECTED TO TEST BLOOD SUGAR THREE TIMES DAILY, Disp: 300 each, Rfl: 11   vitamin B-12 (CYANOCOBALAMIN) 1000 MCG tablet, Take 1,000 mcg by mouth daily., Disp: , Rfl:    allopurinol (ZYLOPRIM) 100 MG tablet, Take 1 tablet (100 mg total) by mouth 2 (two) times daily., Disp: 180 tablet, Rfl: 1   amLODipine (NORVASC) 5 MG tablet, TAKE 1 TABLET(5 MG) BY MOUTH DAILY, Disp: 90 tablet, Rfl: 1   omeprazole (PRILOSEC) 40 MG capsule, Take 1 capsule (40 mg total) by mouth daily., Disp: 90 capsule, Rfl: 1   rosuvastatin (CRESTOR) 20 MG tablet, TAKE 1 TABLET BY MOUTH DAILY ON MONDAY TO FRIDAY, Disp: 75 tablet, Rfl: 1   No Known Allergies   Review of  Systems  Constitutional: Negative.  Negative for fatigue.  Eyes: Negative.  Negative for blurred vision.  Respiratory:  Negative for shortness of breath.   Cardiovascular:  Negative for chest pain and palpitations.  Gastrointestinal:  Negative for constipation.  Endocrine: Negative for polydipsia, polyphagia and polyuria.  Musculoskeletal: Negative.   Skin: Negative.   Neurological:  Negative for dizziness and headaches.  Psychiatric/Behavioral: Negative.       Today's Vitals   03/29/23 1008  BP: 110/60  Pulse: 79  Temp: 98.4 F (36.9 C)  Weight: 184 lb (83.5 kg)  Height: 5\' 10"  (1.778 m)  PainSc: 0-No pain   Body mass index is 26.4 kg/m.  Wt Readings from Last 3 Encounters:  03/29/23 184 lb (83.5 kg)  02/16/23 178 lb (80.7 kg)  01/14/23 179 lb 6.4 oz (81.4 kg)  Objective:  Physical Exam Cardiovascular:     Rate and Rhythm: Normal rate and regular rhythm.  Pulmonary:     Effort: Pulmonary effort is normal.     Breath sounds: Normal breath sounds.  Skin:    General: Skin is warm and dry.  Neurological:     Mental Status: He is alert and oriented to person, place, and time.  Psychiatric:        Mood and Affect: Mood normal.         Assessment And Plan:  Hypoglycemia Assessment & Plan: Reports 2 episodes of low blood sugar. Use g-voke injection for hypoglycemia as needed  Orders: -     Gvoke HypoPen 2-Pack; Inject 0.1 mLs into the skin as needed (inject SQ under the skin for BS less than 70).  Dispense: 0.2 mL; Refill: 1  Type 2 diabetes mellitus with stage 3b chronic kidney disease, with long-term current use of insulin (HCC) Assessment & Plan: Chronic. Stable with last eGFR at 2.03 in 01/2023   Hypertensive nephropathy -     amLODIPine Besylate; TAKE 1 TABLET(5 MG) BY MOUTH DAILY  Dispense: 90 tablet; Refill: 1  Immunization due -     Zoster Vac Recomb Adjuvanted; Inject 0.5 mLs into the muscle once for 1 dose.  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 Stable, continue allopurinol for prophylaxis.  Orders: -     Allopurinol; Take 1 tablet (100 mg total) by mouth 2 (two) times daily.  Dispense: 180 tablet; Refill: 1  Gastroesophageal reflux disease without esophagitis -     Omeprazole; Take 1 capsule (40 mg total) by mouth daily.  Dispense: 90 capsule; Refill: 1     No follow-ups on file.  Patient was given opportunity to ask questions. Patient verbalized understanding of the plan and was able to repeat key elements of the plan. All questions were answered to their satisfaction.  Delena Casebeer Moshe Salisbury, NP  I, Destynee Stringfellow Moshe Salisbury, NP, have reviewed all documentation for this visit. The documentation on 04/07/23 for the exam, diagnosis, procedures, and orders are all accurate and complete.   IF YOU HAVE BEEN REFERRED TO A SPECIALIST, IT MAY TAKE 1-2 WEEKS TO SCHEDULE/PROCESS THE REFERRAL. IF YOU HAVE NOT HEARD FROM US/SPECIALIST IN TWO WEEKS, PLEASE GIVE [place] A CALL AT [phone].   THE PATIENT IS ENCOURAGED TO PRACTICE SOCIAL DISTANCING DUE TO THE COVID-19 PANDEMIC.

## 2023-03-31 ENCOUNTER — Ambulatory Visit: Payer: Medicare Other | Admitting: Internal Medicine

## 2023-04-07 DIAGNOSIS — M109 Gout, unspecified: Secondary | ICD-10-CM | POA: Insufficient documentation

## 2023-04-07 DIAGNOSIS — E162 Hypoglycemia, unspecified: Secondary | ICD-10-CM | POA: Insufficient documentation

## 2023-04-07 DIAGNOSIS — Z23 Encounter for immunization: Secondary | ICD-10-CM | POA: Insufficient documentation

## 2023-04-07 NOTE — Assessment & Plan Note (Signed)
Chronic. Stable with last eGFR at 2.03 in 01/2023

## 2023-04-07 NOTE — Assessment & Plan Note (Signed)
Chronic. Stable, continue allopurinol for prophylaxis.

## 2023-04-07 NOTE — Assessment & Plan Note (Signed)
Chronic on crestor 20mg  every other day

## 2023-04-07 NOTE — Assessment & Plan Note (Signed)
Reports 2 episodes of low blood sugar. Use g-voke injection for hypoglycemia as needed

## 2023-04-20 ENCOUNTER — Ambulatory Visit (INDEPENDENT_AMBULATORY_CARE_PROVIDER_SITE_OTHER): Payer: Medicare Other

## 2023-04-20 DIAGNOSIS — Z0000 Encounter for general adult medical examination without abnormal findings: Secondary | ICD-10-CM | POA: Diagnosis not present

## 2023-04-20 NOTE — Patient Instructions (Signed)
Alec Solis , Thank you for taking time to come for your Medicare Wellness Visit. I appreciate your ongoing commitment to your health goals. Please review the following plan we discussed and let me know if I can assist you in the future.   Referrals/Orders/Follow-Ups/Clinician Recommendations: none  This is a list of the screening recommended for you and due dates:  Health Maintenance  Topic Date Due   COVID-19 Vaccine (6 - 2023-24 season) 08/14/2022   Zoster (Shingles) Vaccine (2 of 2) 08/14/2022   Eye exam for diabetics  10/28/2022   Complete foot exam   01/03/2023   Flu Shot  03/25/2023   DTaP/Tdap/Td vaccine (2 - Td or Tdap) 05/23/2023   Hemoglobin A1C  08/18/2023   Medicare Annual Wellness Visit  04/19/2024   Pneumonia Vaccine  Completed   HPV Vaccine  Aged Out    Advanced directives: (ACP Link)Information on Advanced Care Planning can be found at [facility] [place] Advance Health Care Directives Advance Health Care Directives ([email or web address]   Next Medicare Annual Wellness Visit scheduled for next year: No, office will schedule appointment  insert Preventive Care attachment Insert FALL PREVENTION attachment if needed

## 2023-04-20 NOTE — Progress Notes (Signed)
Subjective:   Alec Solis is a 89 y.o. male who presents for Medicare Annual/Subsequent preventive examination.  Visit Complete: Virtual  I connected with  Alec Solis on 04/20/23 by a audio enabled telemedicine application and verified that I am speaking with the correct person using two identifiers.  Patient Location: Home  Provider Location: Office/Clinic  I discussed the limitations of evaluation and management by telemedicine. The patient expressed understanding and agreed to proceed.  Vital Signs: Unable to obtain new vitals due to this being a telehealth visit.  Review of Systems     Cardiac Risk Factors include: advanced age (>79men, >90 women);diabetes mellitus;male gender     Objective:    Today's Vitals   There is no height or weight on file to calculate BMI.     04/20/2023   10:02 AM 03/11/2022    2:40 PM 01/30/2021    9:04 AM 03/29/2020    8:38 AM 05/23/2019    3:51 PM 05/28/2017    8:13 AM 05/27/2017    9:29 AM  Advanced Directives  Does Patient Have a Medical Advance Directive? No Yes No No No No No  Type of Furniture conservator/restorer;Living will       Copy of Healthcare Power of Attorney in Chart?  No - copy requested     No - copy requested  Would patient like information on creating a medical advance directive?    No - Patient declined  No - Patient declined     Current Medications (verified) Outpatient Encounter Medications as of 04/20/2023  Medication Sig   acetaminophen (TYLENOL) 650 MG CR tablet Take 650 mg by mouth every 8 (eight) hours as needed for pain.    allopurinol (ZYLOPRIM) 100 MG tablet Take 1 tablet (100 mg total) by mouth 2 (two) times daily.   amLODipine (NORVASC) 5 MG tablet TAKE 1 TABLET(5 MG) BY MOUTH DAILY   aspirin 81 MG tablet Take 81 mg by mouth daily.    B-D ULTRAFINE III SHORT PEN 31G X 8 MM MISC USE AS DIRECTED FOUR TIMES DAILY   calcitRIOL (ROCALTROL) 0.25 MCG capsule Take 0.25 mcg by mouth every other  day.    carboxymethylcellulose (REFRESH PLUS) 0.5 % SOLN Place 1 drop into both eyes 3 (three) times daily as needed (Watery eyes).    carvedilol (COREG) 12.5 MG tablet Take 12.5 mg by mouth 2 (two) times daily with a meal.   ferrous sulfate 325 (65 FE) MG tablet Take 325 mg by mouth daily with breakfast.   fluticasone (FLONASE) 50 MCG/ACT nasal spray Place 2 sprays into both nostrils daily.   furosemide (LASIX) 40 MG tablet Take 40 mg by mouth daily as needed for fluid or edema.    Glucagon (GVOKE HYPOPEN 2-PACK) 0.5 MG/0.1ML SOAJ Inject 0.1 mLs into the skin as needed (inject SQ under the skin for BS less than 70).   glucose blood (TRUE METRIX BLOOD GLUCOSE TEST) test strip Use as directed to check blood sugars 3 times per day dx: e11.65   insulin degludec (TRESIBA FLEXTOUCH) 200 UNIT/ML FlexTouch Pen Inject 18 Units into the skin daily.   Insulin Pen Needle 32G X 8 MM MISC Use as directed   Omega-3 Fatty Acids (FISH OIL OMEGA-3 PO) Take 1,200 mg by mouth daily.    omeprazole (PRILOSEC) 40 MG capsule Take 1 capsule (40 mg total) by mouth daily.   ondansetron (ZOFRAN) 4 MG tablet TAKE 1 TABLET(4 MG) BY MOUTH DAILY AS NEEDED  FOR NAUSEA OR VOMITING   rosuvastatin (CRESTOR) 20 MG tablet TAKE 1 TABLET BY MOUTH DAILY ON MONDAY TO FRIDAY   sodium bicarbonate 650 MG tablet Take 1,300 mg by mouth 2 (two) times daily.   TRUEplus Lancets 30G MISC USE AS DIRECTED TO TEST BLOOD SUGAR THREE TIMES DAILY   vitamin B-12 (CYANOCOBALAMIN) 1000 MCG tablet Take 1,000 mcg by mouth daily.   No facility-administered encounter medications on file as of 04/20/2023.    Allergies (verified) Patient has no known allergies.   History: Past Medical History:  Diagnosis Date   Anemia    Anxiety    Arthritis    Cataract    Chronic kidney disease    Depression    Diabetes mellitus without complication (HCC)    type II    GERD (gastroesophageal reflux disease)    Gout    Heart murmur    Hypertension     Myocardial infarction (HCC)    minor Mi- years ago -    Varicose veins of left lower extremity    Past Surgical History:  Procedure Laterality Date   BUNIONECTOMY     cataract     COLONOSCOPY WITH PROPOFOL N/A 05/28/2017   Procedure: COLONOSCOPY WITH PROPOFOL;  Surgeon: Jeani Hawking, MD;  Location: [facility];  Service: Endoscopy;  Laterality: N/A;   COLONOSCOPY WITH PROPOFOL N/A 03/29/2020   Procedure: COLONOSCOPY WITH PROPOFOL;  Surgeon: Jeani Hawking, MD;  Location: [facility];  Service: Endoscopy;  Laterality: N/A;   ENTEROSCOPY N/A 03/29/2020   Procedure: ENTEROSCOPY;  Surgeon: Jeani Hawking, MD;  Location: [facility];  Service: Endoscopy;  Laterality: N/A;   ESOPHAGOGASTRODUODENOSCOPY (EGD) WITH PROPOFOL N/A 05/28/2017   Procedure: ESOPHAGOGASTRODUODENOSCOPY (EGD) WITH PROPOFOL;  Surgeon: Jeani Hawking, MD;  Location: [facility];  Service: Endoscopy;  Laterality: N/A;   HEMOSTASIS CLIP PLACEMENT  03/29/2020   Procedure: HEMOSTASIS CLIP PLACEMENT;  Surgeon: Jeani Hawking, MD;  Location: [facility];  Service: Endoscopy;;   HOT HEMOSTASIS N/A 03/29/2020   Procedure: HOT HEMOSTASIS (ARGON PLASMA COAGULATION/BICAP);  Surgeon: Jeani Hawking, MD;  Location: [facility];  Service: Endoscopy;  Laterality: N/A;   POLYPECTOMY  03/29/2020   Procedure: POLYPECTOMY;  Surgeon: Jeani Hawking, MD;  Location: [facility];  Service: Endoscopy;;   Family History  Problem Relation Age of Onset   Breast cancer Mother    Stomach cancer Mother    Diabetes Mellitus II Maternal Aunt    Lung cancer Neg Hx    Lung disease Neg Hx    Social History   Socioeconomic History   Marital status: Married    Spouse name: Not on file   Number of children: 1   Years of education: Not on file   Highest education level: Not on file  Occupational History   Occupation: retired  Tobacco Use   Smoking status: Former    Current packs/day: 0.00    Average packs/day: 0.5 packs/day for 62.0 years (31.0 ttl pk-yrs)     Types: Cigarettes    Start date: 08/24/1952    Quit date: 08/24/2014    Years since quitting: 8.6   Smokeless tobacco: Never  Vaping Use   Vaping status: Never Used  Substance and Sexual Activity   Alcohol use: Yes    Comment: occassional wine beer   Drug use: No   Sexual activity: Not Currently  Other Topics Concern   Not on file  Social History Narrative   Not on file   Social Determinants of Corporate investment banker  Strain: Low Risk  (04/20/2023)   Overall Financial Resource Strain (CARDIA)    Difficulty of Paying Living Expenses: Not hard at all  Food Insecurity: No Food Insecurity (04/20/2023)   Hunger Vital Sign    Worried About Running Out of Food in the Last Year: Never true    Ran Out of Food in the Last Year: Never true  Transportation Needs: No Transportation Needs (04/20/2023)   PRAPARE - Administrator, Civil Service (Medical): No    Lack of Transportation (Non-Medical): No  Physical Activity: Inactive (04/20/2023)   Exercise Vital Sign    Days of Exercise per Week: 0 days    Minutes of Exercise per Session: 0 min  Stress: No Stress Concern Present (04/20/2023)   [employer] of Occupational Health - Occupational Stress Questionnaire    Feeling of Stress : Not at all  Social Connections: Moderately Isolated (04/20/2023)   Social Connection and Isolation Panel [NHANES]    Frequency of Communication with Friends and Family: More than three times a week    Frequency of Social Gatherings with Friends and Family: More than three times a week    Attends Religious Services: More than 4 times per year    Active Member of [employer] or Organizations: No    Attends Banker Meetings: Never    Marital Status: Widowed    Tobacco Counseling Counseling given: Not Answered   Clinical Intake:  Pre-visit preparation completed: Yes  Pain : No/denies pain     Nutritional Risks: None Diabetes: Yes CBG done?: No Did pt. bring in CBG monitor  from home?: No  How often do you need to have someone help you when you read instructions, pamphlets, or other written materials from your doctor or pharmacy?: 1 - Never  Interpreter Needed?: No  Information entered by :: NAllen LPN   Activities of Daily Living    04/20/2023    9:57 AM  In your present state of health, do you have any difficulty performing the following activities:  Hearing? 0  Vision? 1  Comment slight blur at times  Difficulty concentrating or making decisions? 0  Walking or climbing stairs? 0  Dressing or bathing? 0  Doing errands, shopping? 0  Preparing Food and eating ? N  Using the Toilet? N  In the past six months, have you accidently leaked urine? N  Do you have problems with loss of bowel control? N  Managing your Medications? N  Managing your Finances? N  Housekeeping or managing your Housekeeping? N    Patient Care Team: Dorothyann Peng, MD as PCP - General (Internal Medicine) Harlan Stains, [facility] (Inactive) (Pharmacist) Pa, [facility]  Indicate any recent Medical Services you may have received from other than [facility] providers in the past year (date may be approximate).     Assessment:   This is a routine wellness examination for Alec Solis.  Hearing/Vision screen Hearing Screening - Comments:: Denies hearing issues Vision Screening - Comments:: Regular eye exams, [facility]  Dietary issues and exercise activities discussed:     Goals Addressed             This Visit's Progress    Patient Stated       04/20/2023, stay alive       Depression Screen    04/20/2023   10:04 AM 03/29/2023   10:08 AM 12/18/2022    9:05 AM 09/17/2022    9:09 AM 05/18/2022    8:54 AM 03/11/2022  2:41 PM 10/30/2021    8:57 AM  PHQ 2/9 Scores  PHQ - 2 Score 0 0 3 0 0 0 0  PHQ- 9 Score 0 0 8  0      Fall Risk    04/20/2023   10:03 AM 09/17/2022    9:08 AM 05/18/2022    8:55 AM 03/11/2022    2:41 PM 01/30/2021    9:05 AM  Fall Risk   Falls in the  past year? 0 0 0 0 0  Number falls in past yr: 0 0 0 0   Injury with Fall? 0 0 0 0   Risk for fall due to : Medication side effect No Fall Risks No Fall Risks Medication side effect Medication side effect  Follow up Falls prevention discussed;Falls evaluation completed Falls evaluation completed Falls evaluation completed Falls evaluation completed;Education provided;Falls prevention discussed Falls evaluation completed;Education provided;Falls prevention discussed    MEDICARE RISK AT HOME: Medicare Risk at Home Any stairs in or around the home?: Yes If so, are there any without handrails?: No Home free of loose throw rugs in walkways, pet beds, electrical cords, etc?: Yes Adequate lighting in your home to reduce risk of falls?: Yes Life alert?: No Use of a cane, walker or w/c?: No Grab bars in the bathroom?: No Shower chair or bench in shower?: Yes Elevated toilet seat or a handicapped toilet?: Yes  TIMED UP AND GO:  Was the test performed?  No    Cognitive Function:        04/20/2023   10:04 AM 03/11/2022    2:43 PM 01/30/2021    9:07 AM 05/23/2019    3:55 PM  6CIT Screen  What Year? 0 points 0 points 0 points 0 points  What month? 0 points 0 points 0 points 0 points  What time? 0 points 0 points 3 points 0 points  Count back from 20 0 points 0 points 0 points 0 points  Months in reverse 0 points 0 points 0 points 2 points  Repeat phrase 2 points 0 points 2 points 0 points  Total Score 2 points 0 points 5 points 2 points    Immunizations Immunization History  Administered Date(s) Administered   Fluad Quad(high Dose 65+) 05/18/2022   Influenza, High Dose Seasonal PF 05/25/2018, 04/19/2019   Influenza-Unspecified 05/24/2013, 05/31/2018, 05/07/2021   PFIZER(Purple Top)SARS-COV-2 Vaccination 10/23/2019, 11/15/2019, 09/12/2020, 06/19/2022   PNEUMOCOCCAL CONJUGATE-20 10/30/2021   Pfizer Covid-19 Vaccine Bivalent Booster 62yrs & up 05/29/2021   Pneumococcal Polysaccharide-23  05/24/2013, 05/24/2017   Tdap 05/22/2013   Zoster Recombinant(Shingrix) 06/19/2022    TDAP status: Up to date  Flu Vaccine status: Due, Education has been provided regarding the importance of this vaccine. Advised may receive this vaccine at local pharmacy or Health Dept. Aware to provide a copy of the vaccination record if obtained from local pharmacy or Health Dept. Verbalized acceptance and understanding.  Pneumococcal vaccine status: Up to date  Covid-19 vaccine status: Information provided on how to obtain vaccines.   Qualifies for Shingles Vaccine? Yes   Zostavax completed No   Shingrix Completed?: needs second dose  Screening Tests Health Maintenance  Topic Date Due   COVID-19 Vaccine (6 - 2023-24 season) 08/14/2022   Zoster Vaccines- Shingrix (2 of 2) 08/14/2022   OPHTHALMOLOGY EXAM  10/28/2022   FOOT EXAM  01/03/2023   INFLUENZA VACCINE  03/25/2023   DTaP/Tdap/Td (2 - Td or Tdap) 05/23/2023   HEMOGLOBIN A1C  08/18/2023   Medicare Annual Wellness (  AWV)  04/19/2024   Pneumonia Vaccine 51+ Years old  Completed   HPV VACCINES  Aged Out    Health Maintenance  Health Maintenance Due  Topic Date Due   COVID-19 Vaccine (6 - 2023-24 season) 08/14/2022   Zoster Vaccines- Shingrix (2 of 2) 08/14/2022   OPHTHALMOLOGY EXAM  10/28/2022   FOOT EXAM  01/03/2023   INFLUENZA VACCINE  03/25/2023    Colorectal cancer screening: No longer required.   Lung Cancer Screening: (Low Dose CT Chest recommended if Age 70-80 years, 20 pack-year currently smoking OR have quit w/in 15years.) does not qualify.   Lung Cancer Screening Referral: no  Additional Screening:  Hepatitis C Screening: does not qualify;   Vision Screening: Recommended annual ophthalmology exams for early detection of glaucoma and other disorders of the eye. Is the patient up to date with their annual eye exam?  Yes  Who is the provider or what is the name of the office in which the patient attends annual eye  exams? [facility] If pt is not established with a provider, would they like to be referred to a provider to establish care? No .   Dental Screening: Recommended annual dental exams for proper oral hygiene  Diabetic Foot Exam: Diabetic Foot Exam: Overdue, Pt has been advised about the importance in completing this exam. Pt is scheduled for diabetic foot exam on next appointment.  Community Resource Referral / Chronic Care Management: CRR required this visit?  No   CCM required this visit?  No     Plan:     I have personally reviewed and noted the following in the patient's chart:   Medical and social history Use of alcohol, tobacco or illicit drugs  Current medications and supplements including opioid prescriptions. Patient is not currently taking opioid prescriptions. Functional ability and status Nutritional status Physical activity Advanced directives List of other physicians Hospitalizations, surgeries, and ER visits in previous 12 months Vitals Screenings to include cognitive, depression, and falls Referrals and appointments  In addition, I have reviewed and discussed with patient certain preventive protocols, quality metrics, and best practice recommendations. A written personalized care plan for preventive services as well as general preventive health recommendations were provided to patient.     Barb Merino, LPN   10/29/6576   After Visit Summary: (MyChart) Due to this being a telephonic visit, the after visit summary with patients personalized plan was offered to patient via MyChart   Nurse Notes: none

## 2023-05-11 DIAGNOSIS — N2581 Secondary hyperparathyroidism of renal origin: Secondary | ICD-10-CM | POA: Diagnosis not present

## 2023-05-11 DIAGNOSIS — M109 Gout, unspecified: Secondary | ICD-10-CM | POA: Diagnosis not present

## 2023-05-11 DIAGNOSIS — N183 Chronic kidney disease, stage 3 unspecified: Secondary | ICD-10-CM | POA: Diagnosis not present

## 2023-05-11 DIAGNOSIS — I129 Hypertensive chronic kidney disease with stage 1 through stage 4 chronic kidney disease, or unspecified chronic kidney disease: Secondary | ICD-10-CM | POA: Diagnosis not present

## 2023-05-11 DIAGNOSIS — D631 Anemia in chronic kidney disease: Secondary | ICD-10-CM | POA: Diagnosis not present

## 2023-05-11 DIAGNOSIS — E1122 Type 2 diabetes mellitus with diabetic chronic kidney disease: Secondary | ICD-10-CM | POA: Diagnosis not present

## 2023-05-13 ENCOUNTER — Encounter (HOSPITAL_COMMUNITY): Payer: Self-pay | Admitting: *Deleted

## 2023-05-19 ENCOUNTER — Encounter: Payer: Self-pay | Admitting: Internal Medicine

## 2023-05-19 ENCOUNTER — Ambulatory Visit (INDEPENDENT_AMBULATORY_CARE_PROVIDER_SITE_OTHER): Payer: Medicare Other | Admitting: Internal Medicine

## 2023-05-19 VITALS — BP 120/60 | HR 71 | Temp 98.8°F | Ht 70.0 in | Wt 178.6 lb

## 2023-05-19 DIAGNOSIS — I129 Hypertensive chronic kidney disease with stage 1 through stage 4 chronic kidney disease, or unspecified chronic kidney disease: Secondary | ICD-10-CM

## 2023-05-19 DIAGNOSIS — N1832 Chronic kidney disease, stage 3b: Secondary | ICD-10-CM | POA: Diagnosis not present

## 2023-05-19 DIAGNOSIS — K219 Gastro-esophageal reflux disease without esophagitis: Secondary | ICD-10-CM | POA: Diagnosis not present

## 2023-05-19 DIAGNOSIS — E1122 Type 2 diabetes mellitus with diabetic chronic kidney disease: Secondary | ICD-10-CM

## 2023-05-19 DIAGNOSIS — Z79899 Other long term (current) drug therapy: Secondary | ICD-10-CM

## 2023-05-19 DIAGNOSIS — Z23 Encounter for immunization: Secondary | ICD-10-CM | POA: Diagnosis not present

## 2023-05-19 DIAGNOSIS — E782 Mixed hyperlipidemia: Secondary | ICD-10-CM | POA: Diagnosis not present

## 2023-05-19 DIAGNOSIS — Z794 Long term (current) use of insulin: Secondary | ICD-10-CM | POA: Diagnosis not present

## 2023-05-19 DIAGNOSIS — D649 Anemia, unspecified: Secondary | ICD-10-CM | POA: Diagnosis not present

## 2023-05-19 MED ORDER — ATORVASTATIN CALCIUM 20 MG PO TABS: 20.0000 mg | ORAL_TABLET | Freq: Every day | ORAL | 11 refills | Status: DC

## 2023-05-19 NOTE — Progress Notes (Signed)
I,Victoria T Deloria Lair, CMA,acting as a Neurosurgeon for Gwynneth Aliment, MD.,have documented all relevant documentation on the behalf of Gwynneth Aliment, MD,as directed by  Gwynneth Aliment, MD while in the presence of Gwynneth Aliment, MD.  Subjective:  Patient ID: Alec Solis , male    DOB: 1932/09/18 , 89 y.o.   MRN: [id number]  Chief Complaint  Patient presents with   Hypertension    HPI  Patient presents today for a diabetes and BP check. Patient reports compliance with his meds.Denies headache, chest pain & SOB. Patient does not have any other questions or concerns at this time.   Diabetes He presents for his follow-up diabetic visit. He has type 2 diabetes mellitus. His disease course has been improving. There are no hypoglycemic associated symptoms. Pertinent negatives for hypoglycemia include no dizziness or headaches. Pertinent negatives for diabetes include no blurred vision, no chest pain, no fatigue, no polydipsia, no polyphagia and no polyuria. There are no hypoglycemic complications. Symptoms are improving. There are no diabetic complications. Risk factors for coronary artery disease include sedentary lifestyle, male sex, diabetes mellitus and hypertension. Current diabetic treatment includes insulin injections. He is compliant with treatment most of the time. Meal planning includes avoidance of concentrated sweets. He participates in exercise intermittently. His home blood glucose trend is decreasing steadily. (Blood sugars are running lower down to 86, he is currently taking 20 units nightly) Eye exam is current.  Hypertension This is a chronic problem. The current episode started more than 1 year ago. The problem has been gradually improving since onset. The problem is controlled. Pertinent negatives include no blurred vision, chest pain, headaches, palpitations or shortness of breath. Risk factors for coronary artery disease include diabetes mellitus, dyslipidemia, sedentary lifestyle and  male gender. Identifiable causes of hypertension include chronic renal disease.  Hyperlipidemia This is a chronic problem. Exacerbating diseases include chronic renal disease and diabetes. Pertinent negatives include no chest pain or shortness of breath. Current antihyperlipidemic treatment includes statins. Compliance problems include adherence to exercise.  Risk factors for coronary artery disease include diabetes mellitus, dyslipidemia, male sex, hypertension and a sedentary lifestyle.     Past Medical History:  Diagnosis Date   Anemia    Anxiety    Arthritis    Cataract    Chronic kidney disease    Depression    Diabetes mellitus without complication (HCC)    type II    GERD (gastroesophageal reflux disease)    Gout    Heart murmur    Hypertension    Myocardial infarction (HCC)    minor Mi- years ago -    Varicose veins of left lower extremity      Family History  Problem Relation Age of Onset   Breast cancer Mother    Stomach cancer Mother    Diabetes Mellitus II Maternal Aunt    Lung cancer Neg Hx    Lung disease Neg Hx      Current Outpatient Medications:    acetaminophen (TYLENOL) 650 MG CR tablet, Take 650 mg by mouth every 8 (eight) hours as needed for pain. , Disp: , Rfl:    allopurinol (ZYLOPRIM) 100 MG tablet, Take 1 tablet (100 mg total) by mouth 2 (two) times daily., Disp: 180 tablet, Rfl: 1   amLODipine (NORVASC) 5 MG tablet, TAKE 1 TABLET(5 MG) BY MOUTH DAILY, Disp: 90 tablet, Rfl: 1   aspirin 81 MG tablet, Take 81 mg by mouth daily. , Disp: , Rfl:  atorvastatin (LIPITOR) 20 MG tablet, Take 1 tablet (20 mg total) by mouth daily., Disp: 30 tablet, Rfl: 11   B-D ULTRAFINE III SHORT PEN 31G X 8 MM MISC, USE AS DIRECTED FOUR TIMES DAILY, Disp: 100 each, Rfl: 3   calcitRIOL (ROCALTROL) 0.25 MCG capsule, Take 0.25 mcg by mouth every other day. , Disp: , Rfl:    carboxymethylcellulose (REFRESH PLUS) 0.5 % SOLN, Place 1 drop into both eyes 3 (three) times daily as  needed (Watery eyes). , Disp: , Rfl:    carvedilol (COREG) 12.5 MG tablet, Take 12.5 mg by mouth 2 (two) times daily with a meal., Disp: , Rfl:    ferrous sulfate 325 (65 FE) MG tablet, Take 325 mg by mouth daily with breakfast., Disp: , Rfl:    fluticasone (FLONASE) 50 MCG/ACT nasal spray, Place 2 sprays into both nostrils daily., Disp: 16 g, Rfl: 2   furosemide (LASIX) 40 MG tablet, Take 40 mg by mouth daily as needed for fluid or edema. 2 Tablets by mouth daily as needed, Disp: , Rfl:    Glucagon (GVOKE HYPOPEN 2-PACK) 0.5 MG/0.1ML SOAJ, Inject 0.1 mLs into the skin as needed (inject SQ under the skin for BS less than 70)., Disp: 0.2 mL, Rfl: 1   glucose blood (TRUE METRIX BLOOD GLUCOSE TEST) test strip, Use as directed to check blood sugars 3 times per day dx: e11.65, Disp: 300 each, Rfl: 5   insulin degludec (TRESIBA FLEXTOUCH) 200 UNIT/ML FlexTouch Pen, Inject 18 Units into the skin daily., Disp: 27 mL, Rfl: 1   Insulin Pen Needle 32G X 8 MM MISC, Use as directed, Disp: 100 each, Rfl: 2   Omega-3 Fatty Acids (FISH OIL OMEGA-3 PO), Take 1,200 mg by mouth daily. , Disp: , Rfl:    omeprazole (PRILOSEC) 40 MG capsule, Take 1 capsule (40 mg total) by mouth daily., Disp: 90 capsule, Rfl: 1   ondansetron (ZOFRAN) 4 MG tablet, TAKE 1 TABLET(4 MG) BY MOUTH DAILY AS NEEDED FOR NAUSEA OR VOMITING, Disp: 30 tablet, Rfl: 0   sodium bicarbonate 650 MG tablet, Take 1,300 mg by mouth 2 (two) times daily., Disp: , Rfl:    TRUEplus Lancets 30G MISC, USE AS DIRECTED TO TEST BLOOD SUGAR THREE TIMES DAILY, Disp: 300 each, Rfl: 11   vitamin B-12 (CYANOCOBALAMIN) 1000 MCG tablet, Take 1,000 mcg by mouth daily., Disp: , Rfl:    No Known Allergies   Review of Systems  Constitutional: Negative.  Negative for fatigue.  HENT: Negative.    Eyes:  Negative for blurred vision.  Respiratory: Negative.  Negative for shortness of breath.   Cardiovascular: Negative.  Negative for chest pain and palpitations.  Endocrine:  Negative.  Negative for polydipsia, polyphagia and polyuria.  Skin: Negative.   Allergic/Immunologic: Negative.   Neurological: Negative.  Negative for dizziness and headaches.  Hematological: Negative.      Today's Vitals   05/19/23 0902  BP: 120/60  Pulse: 71  Temp: 98.8 F (37.1 C)  Weight: 178 lb 9.6 oz (81 kg)  Height: 5\' 10"  (1.778 m)  PainSc: 0-No pain   Body mass index is 25.63 kg/m.  Wt Readings from Last 3 Encounters:  05/19/23 178 lb 9.6 oz (81 kg)  03/29/23 184 lb (83.5 kg)  02/16/23 178 lb (80.7 kg)     Objective:  Physical Exam Vitals and nursing note reviewed.  Constitutional:      Appearance: Normal appearance.  HENT:     Head: Normocephalic and atraumatic.  Eyes:     Extraocular Movements: Extraocular movements intact.  Cardiovascular:     Rate and Rhythm: Normal rate and regular rhythm.     Heart sounds: Normal heart sounds.  Pulmonary:     Effort: Pulmonary effort is normal.     Breath sounds: Normal breath sounds.  Musculoskeletal:     Cervical back: Normal range of motion.  Skin:    General: Skin is warm.  Neurological:     General: No focal deficit present.     Mental Status: He is alert.  Psychiatric:        Mood and Affect: Mood normal.         Assessment And Plan:  Type 2 diabetes mellitus with stage 3b chronic kidney disease, with long-term current use of insulin (HCC) Assessment & Plan: Chronic, I will check labs as below. I will adjust meds as needed.  Chronic, he is encouraged to stay well hydrated, avoid NSAIDs and keep BP controlled to prevent progression of CKD.    Orders: -     CMP14+EGFR -     Hemoglobin A1c -     CBC -     PTH, intact and calcium -     Phosphorus -     Protein electrophoresis, serum  Hypertensive nephropathy Assessment & Plan: Chronic, well controlled.  He will c/w amlodipine and carvedilol. Also followed by Renal. Not currently on AceI or ARB.  He is encouraged to follow low sodium diet.      Orders: -     CMP14+EGFR  Mixed hyperlipidemia Assessment & Plan: Chronic, currently on rosuvastatin. However, due to underlying CKD, will need to switch to atorvastatin. He agrees to rto in 8 weeks for a cholesterol check.    Gastroesophageal reflux disease without esophagitis Assessment & Plan: Chronic, reminded to stop eating 3 hrs prior to lying down. He will continue with omeprazole 40mg  daily. He is also encouraged to avoid known triggers.    Polypharmacy -     Vitamin B12  Immunization due -     Flu Vaccine Trivalent High Dose (Fluad)  Other orders -     Atorvastatin Calcium; Take 1 tablet (20 mg total) by mouth daily.  Dispense: 30 tablet; Refill: 11     Return in 8 weeks (on 07/14/2023), or chol check, for controlled DM check-4 months.  Patient was given opportunity to ask questions. Patient verbalized understanding of the plan and was able to repeat key elements of the plan. All questions were answered to their satisfaction.   I, Gwynneth Aliment, MD, have reviewed all documentation for this visit. The documentation on 05/19/23 for the exam, diagnosis, procedures, and orders are all accurate and complete.   IF YOU HAVE BEEN REFERRED TO A SPECIALIST, IT MAY TAKE 1-2 WEEKS TO SCHEDULE/PROCESS THE REFERRAL. IF YOU HAVE NOT HEARD FROM US/SPECIALIST IN TWO WEEKS, PLEASE GIVE [place] A CALL AT [phone].   THE PATIENT IS ENCOURAGED TO PRACTICE SOCIAL DISTANCING DUE TO THE COVID-19 PANDEMIC.

## 2023-05-19 NOTE — Patient Instructions (Addendum)
STOP rosuvastatin  START atorvastatin on Friday, September 27th.  Type 2 Diabetes Mellitus, Diagnosis, Adult Type 2 diabetes (type 2 diabetes mellitus) is a long-term (chronic) disease. It may happen when there is one or both of these problems: The pancreas does not make enough insulin. The body does not react in a normal way to insulin that it makes. Insulin lets sugars go into cells in your body. If you have type 2 diabetes, sugars cannot get into your cells. Sugars build up in the blood. This causes high blood sugar. What are the causes? The exact cause of this condition is not known. What increases the risk? Having type 2 diabetes in your family. Being overweight or very overweight. Not being active. Your body not reacting in a normal way to the insulin it makes. Having higher than normal blood sugar over time. Having a type of diabetes when you were pregnant. Having a condition that causes small fluid-filled sacs on your ovaries. What are the signs or symptoms? At first, you may have no symptoms. You will get symptoms slowly. They may include: More thirst than normal. More hunger than normal. Needing to pee more than normal. Losing weight without trying. Feeling tired. Feeling weak. Seeing things blurry. Dark patches on your skin. How is this treated? This condition may be treated by a diabetes expert. You may need to: Follow an eating plan made by a food expert (dietitian). Get regular exercise. Find ways to deal with stress. Check blood sugar as often as told. Take medicines. Your doctor will set treatment goals for you. Your blood sugar should be at these levels: Before meals: 80-130 mg/dL (1.6-1.0 mmol/L). After meals: below 180 mg/dL (10 mmol/L). Over the last 2-3 months: less than 7%. Follow these instructions at home: Medicines Take your diabetes medicines or insulin every day. Take medicines as told to help you prevent other problems caused by this condition.  You may need: Aspirin. Medicine to lower cholesterol. Medicine to control blood pressure. Questions to ask your doctor Should I meet with a diabetes educator? What medicines do I need, and when should I take them? What will I need to treat my condition at home? When should I check my blood sugar? Where can I find a support group? Who can I call if I have questions? When is my next doctor visit? General instructions Take over-the-counter and prescription medicines only as told by your doctor. Keep all follow-up visits. Where to find more information For help and guidance and more information about diabetes, please go to: American Diabetes Association (ADA): www.diabetes.org American Association of Diabetes Care and Education Specialists (ADCES): www.diabeteseducator.org International Diabetes Federation (IDF): DCOnly.dk Contact a doctor if: Your blood sugar is at or above 240 mg/dL (96.0 mmol/L) for 2 days in a row. You have been sick for 2 days or more, and you are not getting better. You have had a fever for 2 days or more, and you are not getting better. You have any of these problems for more than 6 hours: You cannot eat or drink. You feel like you may vomit. You vomit. You have watery poop (diarrhea). Get help right away if: Your blood sugar is lower than 54 mg/dL (3 mmol/L). You feel mixed up (confused). You have trouble thinking clearly. You have trouble breathing. You have medium or large ketone levels in your pee. These symptoms may be an emergency. Get help right away. Call your local emergency services (911 in the [place] Do not wait to see  if the symptoms will go away. Do not drive yourself to the hospital. Summary Type 2 diabetes is a long-term disease. Your pancreas may not make enough insulin, or your body may not react in a normal way to insulin that it makes. This condition is treated with an eating plan, lifestyle changes, and medicines. Your doctor will set  treatment goals for you. These will help you keep your blood sugar in a healthy range. Keep all follow-up visits. This information is not intended to replace advice given to you by your health care provider. Make sure you discuss any questions you have with your health care provider. Document Revised: 11/04/2020 Document Reviewed: 11/04/2020 Elsevier Patient Education  2024 [employer].

## 2023-05-23 NOTE — Assessment & Plan Note (Signed)
Chronic, I will check labs as below. I will adjust meds as needed.  Chronic, he is encouraged to stay well hydrated, avoid NSAIDs and keep BP controlled to prevent progression of CKD.

## 2023-05-23 NOTE — Assessment & Plan Note (Signed)
Chronic, reminded to stop eating 3 hrs prior to lying down. He will continue with omeprazole 40mg  daily. He is also encouraged to avoid known triggers.

## 2023-05-23 NOTE — Assessment & Plan Note (Signed)
Chronic, currently on rosuvastatin. However, due to underlying CKD, will need to switch to atorvastatin. He agrees to rto in 8 weeks for a cholesterol check.

## 2023-05-23 NOTE — Assessment & Plan Note (Signed)
Chronic, well controlled.  He will c/w amlodipine and carvedilol. Also followed by Renal. Not currently on AceI or ARB.  He is encouraged to follow low sodium diet.

## 2023-05-24 LAB — CMP14+EGFR
ALT: 12 [IU]/L (ref 0–44)
AST: 15 [IU]/L (ref 0–40)
Albumin: 4.3 g/dL (ref 3.7–4.7)
Alkaline Phosphatase: 139 [IU]/L — ABNORMAL HIGH (ref 44–121)
BUN/Creatinine Ratio: 17 (ref 10–24)
BUN: 37 mg/dL — ABNORMAL HIGH (ref 8–27)
Bilirubin Total: 0.2 mg/dL (ref 0.0–1.2)
CO2: 22 mmol/L (ref 20–29)
Calcium: 10 mg/dL (ref 8.6–10.2)
Chloride: 105 mmol/L (ref 96–106)
Creatinine, Ser: 2.2 mg/dL — ABNORMAL HIGH (ref 0.76–1.27)
Globulin, Total: 3.5 g/dL (ref 1.5–4.5)
Glucose: 105 mg/dL — ABNORMAL HIGH (ref 70–99)
Potassium: 4.6 mmol/L (ref 3.5–5.2)
Sodium: 143 mmol/L (ref 134–144)
Total Protein: 7.8 g/dL (ref 6.0–8.5)
eGFR: 28 mL/min/{1.73_m2} — ABNORMAL LOW (ref >59)

## 2023-05-24 LAB — CBC
Hematocrit: 34.4 % — ABNORMAL LOW (ref 37.5–51.0)
Hemoglobin: 9.9 g/dL — ABNORMAL LOW (ref 13.0–17.7)
MCH: 22.9 pg — ABNORMAL LOW (ref 26.6–33.0)
MCHC: 28.8 g/dL — ABNORMAL LOW (ref 31.5–35.7)
MCV: 80 fL (ref 79–97)
NRBC: 3 % — ABNORMAL HIGH (ref 0–0)
Platelets: 254 10*3/uL (ref 150–450)
RBC: 4.32 x10E6/uL (ref 4.14–5.80)
RDW: 19.4 % — ABNORMAL HIGH (ref 11.6–15.4)
WBC: 6.3 10*3/uL (ref 3.4–10.8)

## 2023-05-24 LAB — PTH, INTACT AND CALCIUM: PTH: 92 pg/mL — ABNORMAL HIGH (ref 15–65)

## 2023-05-24 LAB — PROTEIN ELECTROPHORESIS, SERUM
A/G Ratio: 1 (ref 0.7–1.7)
Albumin ELP: 3.8 g/dL (ref 2.9–4.4)
Alpha 1: 0.3 g/dL (ref 0.0–0.4)
Alpha 2: 0.9 g/dL (ref 0.4–1.0)
Beta: 1.1 g/dL (ref 0.7–1.3)
Gamma Globulin: 1.8 g/dL (ref 0.4–1.8)
Globulin, Total: 4 g/dL — ABNORMAL HIGH (ref 2.2–3.9)

## 2023-05-24 LAB — HEMOGLOBIN A1C
Est. average glucose Bld gHb Est-mCnc: 151 mg/dL
Hgb A1c MFr Bld: 6.9 % — ABNORMAL HIGH (ref 4.8–5.6)

## 2023-05-24 LAB — VITAMIN B12: Vitamin B-12: 1309 pg/mL — ABNORMAL HIGH (ref 232–1245)

## 2023-05-24 LAB — PHOSPHORUS: Phosphorus: 3.1 mg/dL (ref 2.8–4.1)

## 2023-05-26 LAB — FERRITIN: Ferritin: 243 ng/mL (ref 30–400)

## 2023-05-26 LAB — IRON AND TIBC
Iron Saturation: 17 % (ref 15–55)
Iron: 55 ug/dL (ref 38–169)
Total Iron Binding Capacity: 318 ug/dL (ref 250–450)
UIBC: 263 ug/dL (ref 111–343)

## 2023-05-26 LAB — SPECIMEN STATUS REPORT

## 2023-06-25 ENCOUNTER — Encounter: Payer: Self-pay | Admitting: Podiatry

## 2023-06-25 ENCOUNTER — Ambulatory Visit (INDEPENDENT_AMBULATORY_CARE_PROVIDER_SITE_OTHER): Payer: Medicare Other | Admitting: Podiatry

## 2023-06-25 VITALS — Ht 70.0 in | Wt 178.0 lb

## 2023-06-25 DIAGNOSIS — M79674 Pain in right toe(s): Secondary | ICD-10-CM | POA: Diagnosis not present

## 2023-06-25 DIAGNOSIS — Q828 Other specified congenital malformations of skin: Secondary | ICD-10-CM

## 2023-06-25 DIAGNOSIS — B351 Tinea unguium: Secondary | ICD-10-CM | POA: Diagnosis not present

## 2023-06-25 DIAGNOSIS — E119 Type 2 diabetes mellitus without complications: Secondary | ICD-10-CM

## 2023-06-25 DIAGNOSIS — L84 Corns and callosities: Secondary | ICD-10-CM

## 2023-06-25 DIAGNOSIS — E1122 Type 2 diabetes mellitus with diabetic chronic kidney disease: Secondary | ICD-10-CM | POA: Diagnosis not present

## 2023-06-25 DIAGNOSIS — M79675 Pain in left toe(s): Secondary | ICD-10-CM | POA: Diagnosis not present

## 2023-06-28 NOTE — Progress Notes (Signed)
ANNUAL DIABETIC FOOT EXAM  Subjective: Alec Solis presents today for annual diabetic foot exam.  Chief Complaint  Patient presents with   Diabetes    Patient is here for routine DFC A1C: 6.9 (05/19/2023)   Patient confirms h/o diabetes.  Patient denies any h/o foot wounds.  Dorothyann Peng, MD is patient's PCP.  Past Medical History:  Diagnosis Date   Anemia    Anxiety    Arthritis    Cataract    Chronic kidney disease    Depression    Diabetes mellitus without complication (HCC)    type II    GERD (gastroesophageal reflux disease)    Gout    Heart murmur    Hypertension    Myocardial infarction (HCC)    minor Mi- years ago -    Varicose veins of left lower extremity    Patient Active Problem List   Diagnosis Date Noted   Hypoglycemia 04/07/2023   Immunization due 04/07/2023   Gout 04/07/2023   Hypertensive nephropathy 09/20/2022   Mixed hyperlipidemia 09/20/2022   Lung nodule 09/20/2022   BMI 25.0-25.9,adult 09/20/2022   PVC (premature ventricular contraction) 11/21/2021   Precordial chest pain 11/21/2021   Abnormal feces 03/20/2021   Abnormal weight loss 03/20/2021   Diarrhea 03/20/2021   Diverticular disease of colon 03/20/2021   Dysphagia 03/20/2021   Epigastric pain 03/20/2021   Hemorrhage of rectum and anus 03/20/2021   Iron deficiency anemia 03/20/2021   Nausea and vomiting 03/20/2021   Black stools 03/06/2020   Gastroesophageal reflux disease without esophagitis 03/06/2020   Paget's disease of bone 10/23/2018   Acute renal failure (ARF) (HCC) 06/11/2016   Hyperglycemia 06/11/2016   Deficiency anemia 12/09/2011   Type 2 diabetes mellitus with stage 3b chronic kidney disease, with long-term current use of insulin (HCC) 12/09/2011   Renal insufficiency 12/09/2011   Past Surgical History:  Procedure Laterality Date   BUNIONECTOMY     cataract     COLONOSCOPY WITH PROPOFOL N/A 05/28/2017   Procedure: COLONOSCOPY WITH PROPOFOL;  Surgeon: Jeani Hawking, MD;  Location: [facility];  Service: Endoscopy;  Laterality: N/A;   COLONOSCOPY WITH PROPOFOL N/A 03/29/2020   Procedure: COLONOSCOPY WITH PROPOFOL;  Surgeon: Jeani Hawking, MD;  Location: [facility];  Service: Endoscopy;  Laterality: N/A;   ENTEROSCOPY N/A 03/29/2020   Procedure: ENTEROSCOPY;  Surgeon: Jeani Hawking, MD;  Location: [facility];  Service: Endoscopy;  Laterality: N/A;   ESOPHAGOGASTRODUODENOSCOPY (EGD) WITH PROPOFOL N/A 05/28/2017   Procedure: ESOPHAGOGASTRODUODENOSCOPY (EGD) WITH PROPOFOL;  Surgeon: Jeani Hawking, MD;  Location: [facility];  Service: Endoscopy;  Laterality: N/A;   HEMOSTASIS CLIP PLACEMENT  03/29/2020   Procedure: HEMOSTASIS CLIP PLACEMENT;  Surgeon: Jeani Hawking, MD;  Location: [facility];  Service: Endoscopy;;   HOT HEMOSTASIS N/A 03/29/2020   Procedure: HOT HEMOSTASIS (ARGON PLASMA COAGULATION/BICAP);  Surgeon: Jeani Hawking, MD;  Location: [facility];  Service: Endoscopy;  Laterality: N/A;   POLYPECTOMY  03/29/2020   Procedure: POLYPECTOMY;  Surgeon: Jeani Hawking, MD;  Location: [facility];  Service: Endoscopy;;   Current Outpatient Medications on File Prior to Visit  Medication Sig Dispense Refill   acetaminophen (TYLENOL) 650 MG CR tablet Take 650 mg by mouth every 8 (eight) hours as needed for pain.      allopurinol (ZYLOPRIM) 100 MG tablet Take 1 tablet (100 mg total) by mouth 2 (two) times daily. 180 tablet 1   amLODipine (NORVASC) 5 MG tablet TAKE 1 TABLET(5 MG) BY MOUTH DAILY 90 tablet 1  aspirin 81 MG tablet Take 81 mg by mouth daily.      atorvastatin (LIPITOR) 20 MG tablet Take 1 tablet (20 mg total) by mouth daily. 30 tablet 11   B-D ULTRAFINE III SHORT PEN 31G X 8 MM MISC USE AS DIRECTED FOUR TIMES DAILY 100 each 3   calcitRIOL (ROCALTROL) 0.25 MCG capsule Take 0.25 mcg by mouth every other day.      carboxymethylcellulose (REFRESH PLUS) 0.5 % SOLN Place 1 drop into both eyes 3 (three) times daily as needed (Watery eyes).       carvedilol (COREG) 12.5 MG tablet Take 12.5 mg by mouth 2 (two) times daily with a meal.     ferrous sulfate 325 (65 FE) MG tablet Take 325 mg by mouth daily with breakfast.     fluticasone (FLONASE) 50 MCG/ACT nasal spray Place 2 sprays into both nostrils daily. 16 g 2   furosemide (LASIX) 40 MG tablet Take 40 mg by mouth daily as needed for fluid or edema. 2 Tablets by mouth daily as needed     Glucagon (GVOKE HYPOPEN 2-PACK) 0.5 MG/0.1ML SOAJ Inject 0.1 mLs into the skin as needed (inject SQ under the skin for BS less than 70). 0.2 mL 1   glucose blood (TRUE METRIX BLOOD GLUCOSE TEST) test strip Use as directed to check blood sugars 3 times per day dx: e11.65 300 each 5   insulin degludec (TRESIBA FLEXTOUCH) 200 UNIT/ML FlexTouch Pen Inject 18 Units into the skin daily. 27 mL 1   Insulin Pen Needle 32G X 8 MM MISC Use as directed 100 each 2   Omega-3 Fatty Acids (FISH OIL OMEGA-3 PO) Take 1,200 mg by mouth daily.      omeprazole (PRILOSEC) 40 MG capsule Take 1 capsule (40 mg total) by mouth daily. 90 capsule 1   ondansetron (ZOFRAN) 4 MG tablet TAKE 1 TABLET(4 MG) BY MOUTH DAILY AS NEEDED FOR NAUSEA OR VOMITING 30 tablet 0   sodium bicarbonate 650 MG tablet Take 1,300 mg by mouth 2 (two) times daily.     TRUEplus Lancets 30G MISC USE AS DIRECTED TO TEST BLOOD SUGAR THREE TIMES DAILY 300 each 11   vitamin B-12 (CYANOCOBALAMIN) 1000 MCG tablet Take 1,000 mcg by mouth daily.     No current facility-administered medications on file prior to visit.    No Known Allergies Social History   Occupational History   Occupation: retired  Tobacco Use   Smoking status: Former    Current packs/day: 0.00    Average packs/day: 0.5 packs/day for 62.0 years (31.0 ttl pk-yrs)    Types: Cigarettes    Start date: 08/24/1952    Quit date: 08/24/2014    Years since quitting: 8.8   Smokeless tobacco: Never  Vaping Use   Vaping status: Never Used  Substance and Sexual Activity   Alcohol use: Yes    Comment:  occassional wine beer   Drug use: No   Sexual activity: Not Currently   Family History  Problem Relation Age of Onset   Breast cancer Mother    Stomach cancer Mother    Diabetes Mellitus II Maternal Aunt    Lung cancer Neg Hx    Lung disease Neg Hx    Immunization History  Administered Date(s) Administered   Fluad Quad(high Dose 65+) 05/18/2022   Fluad Trivalent(High Dose 65+) 05/19/2023   Influenza, High Dose Seasonal PF 05/25/2018, 04/19/2019   Influenza-Unspecified 05/24/2013, 05/31/2018, 05/07/2021   PFIZER(Purple Top)SARS-COV-2 Vaccination 10/23/2019, 11/15/2019, 09/12/2020, 06/19/2022  PNEUMOCOCCAL CONJUGATE-20 10/30/2021   Pfizer Covid-19 Vaccine Bivalent Booster 64yrs & up 05/29/2021   Pneumococcal Polysaccharide-23 05/24/2013, 05/24/2017   Tdap 05/22/2013   Zoster Recombinant(Shingrix) 06/19/2022     Review of Systems: Negative except as noted in the HPI.   Objective: There were no vitals filed for this visit.  Severn Goddard is a pleasant 89 y.o. male in NAD. AAO X 3.  Title   Diabetic Foot Exam - detailed Date & Time: 06/25/2023 10:00 AM Diabetic Foot exam was performed with the following findings: Yes  Visual Foot Exam completed.: Yes  Is there a history of foot ulcer?: No Is there a foot ulcer now?: No Is there swelling?: No Is there elevated skin temperature?: No Is there abnormal foot shape?: No Is there a claw toe deformity?: Yes (Comment: Hammertoes 2-5 b/l) Are the toenails long?: Yes Are the toenails thick?: Yes Are the toenails ingrown?: No Is the skin thin, fragile, shiny and hairless?": No Normal Range of Motion?: Yes Is there foot or ankle muscle weakness?: No Do you have pain in calf while walking?: No Are the shoes appropriate in style and fit?: Yes Can the patient see the bottom of their feet?: No Pulse Foot Exam completed.: Yes   Right Posterior Tibialis: Diminished Left posterior Tibialis: Diminished   Right Dorsalis Pedis: Diminished  Left Dorsalis Pedis: Diminished     Sensory Foot Exam Completed.: Yes Semmes-Weinstein Monofilament Test "+" means "has sensation" and "-" means "no sensation"  R Foot Test Control: Pos L Foot Test Control: Pos   R Site 1-Great Toe: Pos L Site 1-Great Toe: Pos   R Site 4: Pos L Site 4: Pos   R site 5: Pos L Site 5: Pos  R Site 6: Pos L Site 6: Pos     Image components are not supported.   Image components are not supported. Image components are not supported.  Tuning Fork Right vibratory: present Left vibratory: present  Comments HAV with bunion deformity b/l.  Hyperkeratotic lesion(s) L 2nd toe.  No erythema, no edema, no drainage, no fluctuance. Porokeratotic lesion(s) R hallux and submet head 4 left foot. No erythema, no edema, no drainage, no fluctuance.      Lab Results  Component Value Date   HGBA1C 6.9 (H) 05/19/2023   ADA Risk Categorization: Low Risk :  Patient has all of the following: Intact protective sensation No prior foot ulcer  No severe deformity Pedal pulses present  Assessment: 1. Pain due to onychomycosis of toenails of both feet   2. Corns   3. Porokeratosis   4. Diabetes mellitus with stage 3 chronic kidney disease (HCC)   5. Encounter for diabetic foot exam (HCC)     Plan: -Consent given for treatment as described below: -Examined patient. -Diabetic foot examination performed today. -Continue diabetic foot care principles: inspect feet daily, monitor glucose as recommended by PCP and/or Endocrinologist, and follow prescribed diet per PCP, Endocrinologist and/or dietician. -Patient to continue soft, supportive shoe gear daily. -Toenails 1-5 b/l were debrided in length and girth with sterile nail nippers and dremel without iatrogenic bleeding.  -Corn(s) L 2nd toe pared utilizing sterile scalpel blade without complication or incident. Total number debrided=1. -Porokeratotic lesion(s) right great toe and submet head 4 left foot pared and  enucleated with sterile currette without incident. Total number of lesions debrided=2. -Patient/POA to call should there be question/concern in the interim. Return in about 3 months (around 09/25/2023).  Freddie Breech, [name]

## 2023-07-19 ENCOUNTER — Encounter: Payer: Self-pay | Admitting: Internal Medicine

## 2023-07-19 ENCOUNTER — Ambulatory Visit (INDEPENDENT_AMBULATORY_CARE_PROVIDER_SITE_OTHER): Payer: Medicare Other | Admitting: Internal Medicine

## 2023-07-19 VITALS — BP 128/72 | HR 68 | Temp 98.6°F | Ht 70.0 in | Wt 181.2 lb

## 2023-07-19 DIAGNOSIS — K219 Gastro-esophageal reflux disease without esophagitis: Secondary | ICD-10-CM

## 2023-07-19 DIAGNOSIS — Z794 Long term (current) use of insulin: Secondary | ICD-10-CM | POA: Diagnosis not present

## 2023-07-19 DIAGNOSIS — E782 Mixed hyperlipidemia: Secondary | ICD-10-CM | POA: Diagnosis not present

## 2023-07-19 DIAGNOSIS — E1122 Type 2 diabetes mellitus with diabetic chronic kidney disease: Secondary | ICD-10-CM

## 2023-07-19 DIAGNOSIS — R0789 Other chest pain: Secondary | ICD-10-CM

## 2023-07-19 DIAGNOSIS — N1832 Chronic kidney disease, stage 3b: Secondary | ICD-10-CM

## 2023-07-19 DIAGNOSIS — I129 Hypertensive chronic kidney disease with stage 1 through stage 4 chronic kidney disease, or unspecified chronic kidney disease: Secondary | ICD-10-CM | POA: Diagnosis not present

## 2023-07-19 MED ORDER — PANTOPRAZOLE SODIUM 40 MG PO TBEC: 40.0000 mg | DELAYED_RELEASE_TABLET | Freq: Every day | ORAL | 0 refills | Status: DC

## 2023-07-19 NOTE — Progress Notes (Signed)
I,Victoria T Deloria Lair, CMA,acting as a Neurosurgeon for Gwynneth Aliment, MD.,have documented all relevant documentation on the behalf of Gwynneth Aliment, MD,as directed by  Gwynneth Aliment, MD while in the presence of Gwynneth Aliment, MD.  Subjective:  Patient ID: Alec Solis , male    DOB: 1932/11/20 , 89 y.o.   MRN: [id number]  Chief Complaint  Patient presents with   Hyperlipidemia   Hypertension    HPI  Patient presents today for cholesterol & bp check. At his last visit, he was switched from rosuvastatin to atorvastatin due to his renal function.  He reports compliance with medications. Denies headache, chest pain & sob. Denies any questions or concerns.   Hyperlipidemia This is a chronic problem. Exacerbating diseases include diabetes. Associated symptoms include chest pain. Current antihyperlipidemic treatment includes statins. Compliance problems include adherence to exercise.  Risk factors for coronary artery disease include diabetes mellitus, dyslipidemia, male sex, hypertension and a sedentary lifestyle.     Past Medical History:  Diagnosis Date   Anemia    Anxiety    Arthritis    Cataract    Chronic kidney disease    Depression    Diabetes mellitus without complication (HCC)    type II    GERD (gastroesophageal reflux disease)    Gout    Heart murmur    Hypertension    Myocardial infarction (HCC)    minor Mi- years ago -    Varicose veins of left lower extremity      Family History  Problem Relation Age of Onset   Breast cancer Mother    Stomach cancer Mother    Diabetes Mellitus II Maternal Aunt    Lung cancer Neg Hx    Lung disease Neg Hx      Current Outpatient Medications:    acetaminophen (TYLENOL) 650 MG CR tablet, Take 650 mg by mouth every 8 (eight) hours as needed for pain. , Disp: , Rfl:    allopurinol (ZYLOPRIM) 100 MG tablet, Take 1 tablet (100 mg total) by mouth 2 (two) times daily., Disp: 180 tablet, Rfl: 1   amLODipine (NORVASC) 5 MG tablet, TAKE  1 TABLET(5 MG) BY MOUTH DAILY, Disp: 90 tablet, Rfl: 1   aspirin 81 MG tablet, Take 81 mg by mouth daily. , Disp: , Rfl:    atorvastatin (LIPITOR) 20 MG tablet, Take 1 tablet (20 mg total) by mouth daily., Disp: 30 tablet, Rfl: 11   B-D ULTRAFINE III SHORT PEN 31G X 8 MM MISC, USE AS DIRECTED FOUR TIMES DAILY, Disp: 100 each, Rfl: 3   calcitRIOL (ROCALTROL) 0.25 MCG capsule, Take 0.25 mcg by mouth every other day. , Disp: , Rfl:    carboxymethylcellulose (REFRESH PLUS) 0.5 % SOLN, Place 1 drop into both eyes 3 (three) times daily as needed (Watery eyes). , Disp: , Rfl:    carvedilol (COREG) 12.5 MG tablet, Take 12.5 mg by mouth 2 (two) times daily with a meal., Disp: , Rfl:    ferrous sulfate 325 (65 FE) MG tablet, Take 325 mg by mouth daily with breakfast., Disp: , Rfl:    fluticasone (FLONASE) 50 MCG/ACT nasal spray, Place 2 sprays into both nostrils daily., Disp: 16 g, Rfl: 2   furosemide (LASIX) 40 MG tablet, Take 40 mg by mouth daily as needed for fluid or edema. 2 Tablets by mouth daily as needed, Disp: , Rfl:    Glucagon (GVOKE HYPOPEN 2-PACK) 0.5 MG/0.1ML SOAJ, Inject 0.1 mLs into the skin as  needed (inject SQ under the skin for BS less than 70)., Disp: 0.2 mL, Rfl: 1   glucose blood (TRUE METRIX BLOOD GLUCOSE TEST) test strip, Use as directed to check blood sugars 3 times per day dx: e11.65, Disp: 300 each, Rfl: 5   insulin degludec (TRESIBA FLEXTOUCH) 200 UNIT/ML FlexTouch Pen, Inject 18 Units into the skin daily., Disp: 27 mL, Rfl: 1   Insulin Pen Needle 32G X 8 MM MISC, Use as directed, Disp: 100 each, Rfl: 2   Omega-3 Fatty Acids (FISH OIL OMEGA-3 PO), Take 1,200 mg by mouth daily. , Disp: , Rfl:    ondansetron (ZOFRAN) 4 MG tablet, TAKE 1 TABLET(4 MG) BY MOUTH DAILY AS NEEDED FOR NAUSEA OR VOMITING, Disp: 30 tablet, Rfl: 0   pantoprazole (PROTONIX) 40 MG tablet, Take 1 tablet (40 mg total) by mouth daily., Disp: 90 tablet, Rfl: 0   sodium bicarbonate 650 MG tablet, Take 1,300 mg by  mouth 2 (two) times daily., Disp: , Rfl:    TRUEplus Lancets 30G MISC, USE AS DIRECTED TO TEST BLOOD SUGAR THREE TIMES DAILY, Disp: 300 each, Rfl: 11   vitamin B-12 (CYANOCOBALAMIN) 1000 MCG tablet, Take 1,000 mcg by mouth daily., Disp: , Rfl:    No Known Allergies   Review of Systems  Constitutional: Negative.   HENT: Negative.    Respiratory: Negative.    Cardiovascular:  Positive for chest pain.       He c/o intermittent cp. Occurs intermittently on the left side and substernally. Described as a stabbing pain. No associated palpitations and shortness of breath. No diaphoresis. It is sometimes associated with belching. Belching does relieve the discomfort.   Skin: Negative.   Allergic/Immunologic: Negative.   Neurological: Negative.   Hematological: Negative.      Today's Vitals   07/19/23 1433  BP: 128/72  Pulse: 68  Temp: 98.6 F (37 C)  SpO2: 98%  Weight: 181 lb 3.2 oz (82.2 kg)  Height: 5\' 10"  (1.778 m)   Body mass index is 26 kg/m.  Wt Readings from Last 3 Encounters:  07/19/23 181 lb 3.2 oz (82.2 kg)  06/25/23 178 lb (80.7 kg)  05/19/23 178 lb 9.6 oz (81 kg)     Objective:  Physical Exam Vitals and nursing note reviewed.  Constitutional:      Appearance: Normal appearance.  HENT:     Head: Normocephalic and atraumatic.  Eyes:     Extraocular Movements: Extraocular movements intact.  Cardiovascular:     Rate and Rhythm: Normal rate and regular rhythm.     Heart sounds: Normal heart sounds.  Pulmonary:     Effort: Pulmonary effort is normal.     Breath sounds: Normal breath sounds.  Musculoskeletal:     Cervical back: Normal range of motion.  Skin:    General: Skin is warm.  Neurological:     General: No focal deficit present.     Mental Status: He is alert.  Psychiatric:        Mood and Affect: Mood normal.         Assessment And Plan:  Mixed hyperlipidemia Assessment & Plan: Chronic, LDL goal is less than 70. He is now on atorvastatin. Will  check fasting lipid panel. I will adjust dose as needed.   Orders: -     Lipid panel -     ALT  Hypertensive nephropathy Assessment & Plan: Chronic, well controlled.  He will c/w amlodipine and carvedilol. Also followed by Renal. Not currently on  AceI or ARB.  He is encouraged to follow low sodium diet.      Stage 3b chronic kidney disease (HCC) Assessment & Plan: Chronic, he is encouraged to stay well hydrated, avoid NSAIDs and keep BP controlled to prevent progression of CKD.     Gastroesophageal reflux disease without esophagitis Assessment & Plan: Chronic, currently on omeprazole, but having breakthrough sx. Will switch him to pantoprazole. He will let me know in a week or two if this is effective.    Atypical chest pain Assessment & Plan: Sx are suggestive of GI source. I will d/c omeprazole and start him on pantoprazole. I plan to eventually titrate him off of the medication due to underlying CKD. He is encouraged to avoid known triggers of his reflux.    Other orders -     Pantoprazole Sodium; Take 1 tablet (40 mg total) by mouth daily.  Dispense: 90 tablet; Refill: 0     Return if symptoms worsen or fail to improve.  Patient was given opportunity to ask questions. Patient verbalized understanding of the plan and was able to repeat key elements of the plan. All questions were answered to their satisfaction.    I, Gwynneth Aliment, MD, have reviewed all documentation for this visit. The documentation on 07/19/23 for the exam, diagnosis, procedures, and orders are all accurate and complete.   IF YOU HAVE BEEN REFERRED TO A SPECIALIST, IT MAY TAKE 1-2 WEEKS TO SCHEDULE/PROCESS THE REFERRAL. IF YOU HAVE NOT HEARD FROM US/SPECIALIST IN TWO WEEKS, PLEASE GIVE [place] A CALL AT [phone].   THE PATIENT IS ENCOURAGED TO PRACTICE SOCIAL DISTANCING DUE TO THE COVID-19 PANDEMIC.

## 2023-07-19 NOTE — Patient Instructions (Addendum)
Stop omeprazole  Start pantoprazole once daily  Cholesterol Content in Foods Cholesterol is a waxy, fat-like substance that helps to carry fat in the blood. The body needs cholesterol in small amounts, but too much cholesterol can cause damage to the arteries and heart. What foods have cholesterol?  Cholesterol is found in animal-based foods, such as meat, seafood, and dairy. Generally, low-fat dairy and lean meats have less cholesterol than full-fat dairy and fatty meats. The milligrams of cholesterol per serving (mg per serving) of common cholesterol-containing foods are listed below. Meats and other proteins Egg -- one large whole egg has 186 mg. Veal shank -- 4 oz (113 g) has 141 mg. Lean ground [place] (93% lean) -- 4 oz (113 g) has 118 mg. Fat-trimmed lamb loin -- 4 oz (113 g) has 106 mg. Lean ground beef (90% lean) -- 4 oz (113 g) has 100 mg. Lobster -- 3.5 oz (99 g) has 90 mg. Pork loin chops -- 4 oz (113 g) has 86 mg. Canned salmon -- 3.5 oz (99 g) has 83 mg. Fat-trimmed beef top loin -- 4 oz (113 g) has 78 mg. Frankfurter -- 1 frank (3.5 oz or 99 g) has 77 mg. Crab -- 3.5 oz (99 g) has 71 mg. Roasted chicken without skin, white meat -- 4 oz (113 g) has 66 mg. Light bologna -- 2 oz (57 g) has 45 mg. Deli-cut [place] -- 2 oz (57 g) has 31 mg. Canned tuna -- 3.5 oz (99 g) has 31 mg. Tomasa Blase -- 1 oz (28 g) has 29 mg. Oysters and mussels (raw) -- 3.5 oz (99 g) has 25 mg. Mackerel -- 1 oz (28 g) has 22 mg. Trout -- 1 oz (28 g) has 20 mg. Pork sausage -- 1 link (1 oz or 28 g) has 17 mg. Salmon -- 1 oz (28 g) has 16 mg. Tilapia -- 1 oz (28 g) has 14 mg. Dairy Soft-serve ice cream --  cup (4 oz or 86 g) has 103 mg. Whole-milk yogurt -- 1 cup (8 oz or 245 g) has 29 mg. Cheddar cheese -- 1 oz (28 g) has 28 mg. American cheese -- 1 oz (28 g) has 28 mg. Whole milk -- 1 cup (8 oz or 250 mL) has 23 mg. 2% milk -- 1 cup (8 oz or 250 mL) has 18 mg. Cream cheese -- 1 tablespoon (Tbsp)  (14.5 g) has 15 mg. Cottage cheese --  cup (4 oz or 113 g) has 14 mg. Low-fat (1%) milk -- 1 cup (8 oz or 250 mL) has 10 mg. Sour cream -- 1 Tbsp (12 g) has 8.5 mg. Low-fat yogurt -- 1 cup (8 oz or 245 g) has 8 mg. Nonfat Greek yogurt -- 1 cup (8 oz or 228 g) has 7 mg. Half-and-half cream -- 1 Tbsp (15 mL) has 5 mg. Fats and oils Cod liver oil -- 1 tablespoon (Tbsp) (13.6 g) has 82 mg. Butter -- 1 Tbsp (14 g) has 15 mg. Lard -- 1 Tbsp (12.8 g) has 14 mg. Bacon grease -- 1 Tbsp (12.9 g) has 14 mg. Mayonnaise -- 1 Tbsp (13.8 g) has 5-10 mg. Margarine -- 1 Tbsp (14 g) has 3-10 mg. The items listed above may not be a complete list of foods with cholesterol. Exact amounts of cholesterol in these foods may vary depending on specific ingredients and brands. Contact a dietitian for more information. What foods do not have cholesterol? Most plant-based foods do not have cholesterol unless  you combine them with a food that has cholesterol. Foods without cholesterol include: Grains and cereals. Vegetables. Fruits. Vegetable oils, such as olive, canola, and sunflower oil. Legumes, such as peas, beans, and lentils. Nuts and seeds. Egg whites. The items listed above may not be a complete list of foods that do not have cholesterol. Contact a dietitian for more information. Summary The body needs cholesterol in small amounts, but too much cholesterol can cause damage to the arteries and heart. Cholesterol is found in animal-based foods, such as meat, seafood, and dairy. Generally, low-fat dairy and lean meats have less cholesterol than full-fat dairy and fatty meats. This information is not intended to replace advice given to you by your health care provider. Make sure you discuss any questions you have with your health care provider. Document Revised: 12/20/2020 Document Reviewed: 12/20/2020 Elsevier Patient Education  2024 [employer].

## 2023-07-20 LAB — ALT: ALT: 17 [IU]/L (ref 0–44)

## 2023-07-20 LAB — LIPID PANEL
Chol/HDL Ratio: 4.6 {ratio} (ref 0.0–5.0)
Cholesterol, Total: 106 mg/dL (ref 100–199)
HDL: 23 mg/dL — ABNORMAL LOW (ref >39)
LDL Chol Calc (NIH): 51 mg/dL (ref 0–99)
Triglycerides: 191 mg/dL — ABNORMAL HIGH (ref 0–149)
VLDL Cholesterol Cal: 32 mg/dL (ref 5–40)

## 2023-07-23 DIAGNOSIS — R0789 Other chest pain: Secondary | ICD-10-CM | POA: Insufficient documentation

## 2023-07-23 DIAGNOSIS — N1832 Chronic kidney disease, stage 3b: Secondary | ICD-10-CM | POA: Insufficient documentation

## 2023-07-23 NOTE — Assessment & Plan Note (Signed)
Sx are suggestive of GI source. I will d/c omeprazole and start him on pantoprazole. I plan to eventually titrate him off of the medication due to underlying CKD. He is encouraged to avoid known triggers of his reflux.

## 2023-07-23 NOTE — Assessment & Plan Note (Signed)
Chronic, LDL goal is less than 70. He is now on atorvastatin. Will check fasting lipid panel. I will adjust dose as needed.

## 2023-07-23 NOTE — Assessment & Plan Note (Signed)
 Chronic, he is encouraged to stay well hydrated, avoid NSAIDs and keep BP controlled to prevent progression of CKD.

## 2023-07-23 NOTE — Assessment & Plan Note (Signed)
Chronic, currently on omeprazole, but having breakthrough sx. Will switch him to pantoprazole. He will let me know in a week or two if this is effective.

## 2023-07-23 NOTE — Assessment & Plan Note (Signed)
Chronic, well controlled.  He will c/w amlodipine and carvedilol. Also followed by Renal. Not currently on AceI or ARB.  He is encouraged to follow low sodium diet.

## 2023-08-19 ENCOUNTER — Telehealth: Payer: Self-pay

## 2023-08-24 ENCOUNTER — Other Ambulatory Visit: Payer: Self-pay | Admitting: Internal Medicine

## 2023-08-31 NOTE — Telephone Encounter (Signed)
 PAP: PAP application for Tresiba , (Novo Nordisk) has been mailed to pt's home address on file. Will fax provider portion of application to provider's office when pt's portion is received.   Please note I have faxed to provider portion of the application to Dr. Catheryn Slocumb for review

## 2023-09-09 DIAGNOSIS — H532 Diplopia: Secondary | ICD-10-CM | POA: Diagnosis not present

## 2023-09-09 DIAGNOSIS — H353131 Nonexudative age-related macular degeneration, bilateral, early dry stage: Secondary | ICD-10-CM | POA: Diagnosis not present

## 2023-09-09 DIAGNOSIS — H35371 Puckering of macula, right eye: Secondary | ICD-10-CM | POA: Diagnosis not present

## 2023-09-09 DIAGNOSIS — E119 Type 2 diabetes mellitus without complications: Secondary | ICD-10-CM | POA: Diagnosis not present

## 2023-09-09 LAB — HM DIABETES EYE EXAM

## 2023-09-21 ENCOUNTER — Encounter: Payer: Self-pay | Admitting: Internal Medicine

## 2023-09-21 ENCOUNTER — Ambulatory Visit: Payer: Medicare Other | Admitting: Internal Medicine

## 2023-09-21 ENCOUNTER — Encounter (HOSPITAL_COMMUNITY): Payer: Self-pay | Admitting: *Deleted

## 2023-09-21 VITALS — BP 118/72 | HR 87 | Temp 98.1°F | Ht 70.0 in | Wt 184.8 lb

## 2023-09-21 DIAGNOSIS — N1832 Chronic kidney disease, stage 3b: Secondary | ICD-10-CM | POA: Insufficient documentation

## 2023-09-21 DIAGNOSIS — E1122 Type 2 diabetes mellitus with diabetic chronic kidney disease: Secondary | ICD-10-CM

## 2023-09-21 DIAGNOSIS — D631 Anemia in chronic kidney disease: Secondary | ICD-10-CM | POA: Insufficient documentation

## 2023-09-21 DIAGNOSIS — I129 Hypertensive chronic kidney disease with stage 1 through stage 4 chronic kidney disease, or unspecified chronic kidney disease: Secondary | ICD-10-CM

## 2023-09-21 DIAGNOSIS — J432 Centrilobular emphysema: Secondary | ICD-10-CM | POA: Diagnosis not present

## 2023-09-21 DIAGNOSIS — Z794 Long term (current) use of insulin: Secondary | ICD-10-CM

## 2023-09-21 NOTE — Assessment & Plan Note (Signed)
Chronic, well controlled.  He will c/w amlodipine and carvedilol. Also followed by Renal. Not currently on AceI or ARB.  He is encouraged to follow low sodium diet.

## 2023-09-21 NOTE — Assessment & Plan Note (Signed)
Chronic, Pulmonary input appreciated. Most recent Pulmonary notes reviewed, since he has been asymptomatic, he only requires prn Pulmonary follow up at this time. Most recent CT chest reviewed, he does have 4mm nodules, based on Fleischner society guidelines, these require no further follow up.

## 2023-09-21 NOTE — Patient Instructions (Signed)
Hypertension, Adult Hypertension is another name for high blood pressure. High blood pressure forces your heart to work harder to pump blood. This can cause problems over time. There are two numbers in a blood pressure reading. There is a top number (systolic) over a bottom number (diastolic). It is best to have a blood pressure that is below 120/80. What are the causes? The cause of this condition is not known. Some other conditions can lead to high blood pressure. What increases the risk? Some lifestyle factors can make you more likely to develop high blood pressure: Smoking. Not getting enough exercise or physical activity. Being overweight. Having too much fat, sugar, calories, or salt (sodium) in your diet. Drinking too much alcohol. Other risk factors include: Having any of these conditions: Heart disease. Diabetes. High cholesterol. Kidney disease. Obstructive sleep apnea. Having a family history of high blood pressure and high cholesterol. Age. The risk increases with age. Stress. What are the signs or symptoms? High blood pressure may not cause symptoms. Very high blood pressure (hypertensive crisis) may cause: Headache. Fast or uneven heartbeats (palpitations). Shortness of breath. Nosebleed. Vomiting or feeling like you may vomit (nauseous). Changes in how you see. Very bad chest pain. Feeling dizzy. Seizures. How is this treated? This condition is treated by making healthy lifestyle changes, such as: Eating healthy foods. Exercising more. Drinking less alcohol. Your doctor may prescribe medicine if lifestyle changes do not help enough and if: Your top number is above 130. Your bottom number is above 80. Your personal target blood pressure may vary. Follow these instructions at home: Eating and drinking  If told, follow the DASH eating plan. To follow this plan: Fill one half of your plate at each meal with fruits and vegetables. Fill one fourth of your plate  at each meal with whole grains. Whole grains include whole-wheat pasta, brown rice, and whole-grain bread. Eat or drink low-fat dairy products, such as skim milk or low-fat yogurt. Fill one fourth of your plate at each meal with low-fat (lean) proteins. Low-fat proteins include fish, chicken without skin, eggs, beans, and tofu. Avoid fatty meat, cured and processed meat, or chicken with skin. Avoid pre-made or processed food. Limit the amount of salt in your diet to less than 1,500 mg each day. Do not drink alcohol if: Your doctor tells you not to drink. You are pregnant, may be pregnant, or are planning to become pregnant. If you drink alcohol: Limit how much you have to: 0-1 drink a day for women. 0-2 drinks a day for men. Know how much alcohol is in your drink. In the [place] one drink equals one 12 oz bottle of beer (355 mL), one 5 oz glass of wine (148 mL), or one 1 oz glass of hard liquor (44 mL). Lifestyle  Work with your doctor to stay at a healthy weight or to lose weight. Ask your doctor what the best weight is for you. Get at least 30 minutes of exercise that causes your heart to beat faster (aerobic exercise) most days of the week. This may include walking, swimming, or biking. Get at least 30 minutes of exercise that strengthens your muscles (resistance exercise) at least 3 days a week. This may include lifting weights or doing Pilates. Do not smoke or use any products that contain nicotine or tobacco. If you need help quitting, ask your doctor. Check your blood pressure at home as told by your doctor. Keep all follow-up visits. Medicines Take over-the-counter and prescription medicines  only as told by your doctor. Follow directions carefully. Do not skip doses of blood pressure medicine. The medicine does not work as well if you skip doses. Skipping doses also puts you at risk for problems. Ask your doctor about side effects or reactions to medicines that you should watch  for. Contact a doctor if: You think you are having a reaction to the medicine you are taking. You have headaches that keep coming back. You feel dizzy. You have swelling in your ankles. You have trouble with your vision. Get help right away if: You get a very bad headache. You start to feel mixed up (confused). You feel weak or numb. You feel faint. You have very bad pain in your: Chest. Belly (abdomen). You vomit more than once. You have trouble breathing. These symptoms may be an emergency. Get help right away. Call 911. Do not wait to see if the symptoms will go away. Do not drive yourself to the hospital. Summary Hypertension is another name for high blood pressure. High blood pressure forces your heart to work harder to pump blood. For most people, a normal blood pressure is less than 120/80. Making healthy choices can help lower blood pressure. If your blood pressure does not get lower with healthy choices, you may need to take medicine. This information is not intended to replace advice given to you by your health care provider. Make sure you discuss any questions you have with your health care provider. Document Revised: 05/29/2021 Document Reviewed: 05/29/2021 Elsevier Patient Education  2024 [employer].

## 2023-09-21 NOTE — Assessment & Plan Note (Addendum)
Chronic, I will check labs as below. He will continue with Tresiba 18 units nightly.  I  will adjust meds as needed.  Regarding CKD, he is encouraged to stay well hydrated, avoid NSAIDs and keep BP controlled to prevent progression of CKD.  He wil rto in 3 months for re-evaluation.

## 2023-09-21 NOTE — Progress Notes (Signed)
I,Victoria T Deloria Lair, CMA,acting as a Neurosurgeon for Gwynneth Aliment, MD.,have documented all relevant documentation on the behalf of Gwynneth Aliment, MD,as directed by  Gwynneth Aliment, MD while in the presence of Gwynneth Aliment, MD.  Subjective:  Patient ID: Alec Solis , male    DOB: 28-Jun-1933 , 89 y.o.   MRN: [id number]  Chief Complaint  Patient presents with   Hypertension   Diabetes    HPI  Patient presents today for a diabetes and BP check. Patient reports compliance with his meds.  He denies headache, chest pain & SOB. Patient does not have any other questions or concerns at this time.  He often visits his daughter in [place], he plans to return in the Spring.  Diabetes He presents for his follow-up diabetic visit. He has type 2 diabetes mellitus. His disease course has been improving. There are no hypoglycemic associated symptoms. Pertinent negatives for hypoglycemia include no dizziness or headaches. Pertinent negatives for diabetes include no blurred vision, no chest pain, no fatigue, no polydipsia, no polyphagia and no polyuria. There are no hypoglycemic complications. Symptoms are improving. There are no diabetic complications. Risk factors for coronary artery disease include sedentary lifestyle, male sex, diabetes mellitus and hypertension. Current diabetic treatment includes insulin injections. He is compliant with treatment most of the time. Meal planning includes avoidance of concentrated sweets. He participates in exercise intermittently. His home blood glucose trend is decreasing steadily. (Blood sugars are running lower down to 86, he is currently taking 20 units nightly) Eye exam is current.  Hypertension This is a chronic problem. The current episode started more than 1 year ago. The problem has been gradually improving since onset. The problem is controlled. Pertinent negatives include no blurred vision, chest pain, headaches, palpitations or shortness of breath. Risk factors for  coronary artery disease include diabetes mellitus, dyslipidemia, sedentary lifestyle and male gender. Identifiable causes of hypertension include chronic renal disease.  Hyperlipidemia This is a chronic problem. Exacerbating diseases include chronic renal disease and diabetes. Pertinent negatives include no chest pain or shortness of breath. Current antihyperlipidemic treatment includes statins. Compliance problems include adherence to exercise.  Risk factors for coronary artery disease include diabetes mellitus, dyslipidemia, male sex, hypertension and a sedentary lifestyle.     Past Medical History:  Diagnosis Date   Anemia    Anxiety    Arthritis    Cataract    Chronic kidney disease    Depression    Diabetes mellitus without complication (HCC)    type II    GERD (gastroesophageal reflux disease)    Gout    Heart murmur    Hypertension    Myocardial infarction (HCC)    minor Mi- years ago -    Varicose veins of left lower extremity      Family History  Problem Relation Age of Onset   Breast cancer Mother    Stomach cancer Mother    Diabetes Mellitus II Maternal Aunt    Lung cancer Neg Hx    Lung disease Neg Hx      Current Outpatient Medications:    acetaminophen (TYLENOL) 650 MG CR tablet, Take 650 mg by mouth every 8 (eight) hours as needed for pain. , Disp: , Rfl:    allopurinol (ZYLOPRIM) 100 MG tablet, Take 1 tablet (100 mg total) by mouth 2 (two) times daily., Disp: 180 tablet, Rfl: 1   amLODipine (NORVASC) 5 MG tablet, TAKE 1 TABLET(5 MG) BY MOUTH DAILY, Disp: 90 tablet,  Rfl: 1   aspirin 81 MG tablet, Take 81 mg by mouth daily. , Disp: , Rfl:    atorvastatin (LIPITOR) 20 MG tablet, Take 1 tablet (20 mg total) by mouth daily., Disp: 30 tablet, Rfl: 11   B-D ULTRAFINE III SHORT PEN 31G X 8 MM MISC, USE AS DIRECTED FOUR TIMES DAILY, Disp: 100 each, Rfl: 3   calcitRIOL (ROCALTROL) 0.25 MCG capsule, Take 0.25 mcg by mouth every other day. , Disp: , Rfl:     carboxymethylcellulose (REFRESH PLUS) 0.5 % SOLN, Place 1 drop into both eyes 3 (three) times daily as needed (Watery eyes). , Disp: , Rfl:    carvedilol (COREG) 12.5 MG tablet, Take 12.5 mg by mouth 2 (two) times daily with a meal., Disp: , Rfl:    ferrous sulfate 325 (65 FE) MG tablet, Take 325 mg by mouth daily with breakfast., Disp: , Rfl:    fluticasone (FLONASE) 50 MCG/ACT nasal spray, Place 2 sprays into both nostrils daily., Disp: 16 g, Rfl: 2   furosemide (LASIX) 40 MG tablet, Take 40 mg by mouth daily as needed for fluid or edema. 2 Tablets by mouth daily as needed, Disp: , Rfl:    Glucagon (GVOKE HYPOPEN 2-PACK) 0.5 MG/0.1ML SOAJ, Inject 0.1 mLs into the skin as needed (inject SQ under the skin for BS less than 70)., Disp: 0.2 mL, Rfl: 1   glucose blood (TRUE METRIX BLOOD GLUCOSE TEST) test strip, Use as directed to check blood sugars 3 times per day dx: e11.65, Disp: 300 each, Rfl: 5   insulin degludec (TRESIBA FLEXTOUCH) 200 UNIT/ML FlexTouch Pen, Inject 18 Units into the skin daily., Disp: 27 mL, Rfl: 1   Insulin Pen Needle 32G X 8 MM MISC, Use as directed, Disp: 100 each, Rfl: 2   Omega-3 Fatty Acids (FISH OIL OMEGA-3 PO), Take 1,200 mg by mouth daily. , Disp: , Rfl:    ondansetron (ZOFRAN) 4 MG tablet, TAKE 1 TABLET(4 MG) BY MOUTH DAILY AS NEEDED FOR NAUSEA OR VOMITING, Disp: 30 tablet, Rfl: 0   pantoprazole (PROTONIX) 40 MG tablet, Take 1 tablet (40 mg total) by mouth daily., Disp: 90 tablet, Rfl: 0   sodium bicarbonate 650 MG tablet, Take 1,300 mg by mouth 2 (two) times daily., Disp: , Rfl:    TRUEplus Lancets 30G MISC, USE AS DIRECTED TO TEST BLOOD SUGAR THREE TIMES DAILY, Disp: 300 each, Rfl: 11   vitamin B-12 (CYANOCOBALAMIN) 1000 MCG tablet, Take 1,000 mcg by mouth daily., Disp: , Rfl:    No Known Allergies   Review of Systems  Constitutional: Negative.  Negative for fatigue.  Eyes:  Negative for blurred vision.  Respiratory: Negative.  Negative for shortness of breath.    Cardiovascular:  Negative for chest pain and palpitations.  Gastrointestinal: Negative.   Endocrine: Negative.  Negative for polydipsia, polyphagia and polyuria.  Skin: Negative.   Allergic/Immunologic: Negative.   Neurological:  Negative for dizziness and headaches.  Hematological: Negative.      Today's Vitals   09/21/23 1151  BP: 118/72  Pulse: 87  Temp: 98.1 F (36.7 C)  SpO2: 98%  Weight: 184 lb 12.8 oz (83.8 kg)  Height: 5\' 10"  (1.778 m)   Body mass index is 26.52 kg/m.  Wt Readings from Last 3 Encounters:  09/21/23 184 lb 12.8 oz (83.8 kg)  07/19/23 181 lb 3.2 oz (82.2 kg)  06/25/23 178 lb (80.7 kg)     Objective:  Physical Exam Vitals and nursing note reviewed.  Constitutional:  Appearance: Normal appearance.  HENT:     Head: Normocephalic and atraumatic.  Eyes:     Extraocular Movements: Extraocular movements intact.  Cardiovascular:     Rate and Rhythm: Normal rate and regular rhythm.     Heart sounds: Normal heart sounds.  Pulmonary:     Effort: Pulmonary effort is normal.     Breath sounds: Normal breath sounds.  Musculoskeletal:     Cervical back: Normal range of motion.  Skin:    General: Skin is warm.  Neurological:     General: No focal deficit present.     Mental Status: He is alert.  Psychiatric:        Mood and Affect: Mood normal.         Assessment And Plan:  Hypertensive nephropathy Assessment & Plan: Chronic, well controlled.  He will c/w amlodipine and carvedilol. Also followed by Renal. Not currently on AceI or ARB.  He is encouraged to follow low sodium diet.     Orders: -     CMP14+EGFR  Type 2 diabetes mellitus with stage 3b chronic kidney disease, with long-term current use of insulin (HCC) Assessment & Plan: Chronic, I will check labs as below. He will continue with Tresiba 18 units nightly.  I  will adjust meds as needed.  Regarding CKD, he is encouraged to stay well hydrated, avoid NSAIDs and keep BP controlled to  prevent progression of CKD.  He wil rto in 3 months for re-evaluation.   Orders: -     CMP14+EGFR -     Hemoglobin A1c  Anemia in stage 3b chronic kidney disease (HCC) -     CBC  Centrilobular emphysema (HCC) Assessment & Plan: Chronic, Pulmonary input appreciated. Most recent Pulmonary notes reviewed, since he has been asymptomatic, he only requires prn Pulmonary follow up at this time. Most recent CT chest reviewed, he does have 4mm nodules, based on Fleischner society guidelines, these require no further follow up.       Return in 3 months (on 12/20/2023) for dm check.  Patient was given opportunity to ask questions. Patient verbalized understanding of the plan and was able to repeat key elements of the plan. All questions were answered to their satisfaction.    I, Gwynneth Aliment, MD, have reviewed all documentation for this visit. The documentation on 09/21/23 for the exam, diagnosis, procedures, and orders are all accurate and complete.   IF YOU HAVE BEEN REFERRED TO A SPECIALIST, IT MAY TAKE 1-2 WEEKS TO SCHEDULE/PROCESS THE REFERRAL. IF YOU HAVE NOT HEARD FROM US/SPECIALIST IN TWO WEEKS, PLEASE GIVE [place] A CALL AT [phone] X [phone].   THE PATIENT IS ENCOURAGED TO PRACTICE SOCIAL DISTANCING DUE TO THE COVID-19 PANDEMIC.

## 2023-09-22 LAB — CBC
Hematocrit: 33.3 % — ABNORMAL LOW (ref 37.5–51.0)
Hemoglobin: 10.1 g/dL — ABNORMAL LOW (ref 13.0–17.7)
MCH: 23.8 pg — ABNORMAL LOW (ref 26.6–33.0)
MCHC: 30.3 g/dL — ABNORMAL LOW (ref 31.5–35.7)
MCV: 78 fL — ABNORMAL LOW (ref 79–97)
NRBC: 4 % — ABNORMAL HIGH (ref 0–0)
Platelets: 249 10*3/uL (ref 150–450)
RBC: 4.25 x10E6/uL (ref 4.14–5.80)
RDW: 18.2 % — ABNORMAL HIGH (ref 11.6–15.4)
WBC: 6.8 10*3/uL (ref 3.4–10.8)

## 2023-09-22 LAB — CMP14+EGFR
ALT: 11 [IU]/L (ref 0–44)
AST: 18 [IU]/L (ref 0–40)
Albumin: 4.1 g/dL (ref 3.7–4.7)
Alkaline Phosphatase: 146 [IU]/L — ABNORMAL HIGH (ref 44–121)
BUN/Creatinine Ratio: 17 (ref 10–24)
BUN: 40 mg/dL — ABNORMAL HIGH (ref 8–27)
Bilirubin Total: <0.2 mg/dL (ref 0.0–1.2)
CO2: 21 mmol/L (ref 20–29)
Calcium: 9.6 mg/dL (ref 8.6–10.2)
Chloride: 104 mmol/L (ref 96–106)
Creatinine, Ser: 2.36 mg/dL — ABNORMAL HIGH (ref 0.76–1.27)
Globulin, Total: 3.5 g/dL (ref 1.5–4.5)
Glucose: 164 mg/dL — ABNORMAL HIGH (ref 70–99)
Potassium: 4.7 mmol/L (ref 3.5–5.2)
Sodium: 139 mmol/L (ref 134–144)
Total Protein: 7.6 g/dL (ref 6.0–8.5)
eGFR: 26 mL/min/{1.73_m2} — ABNORMAL LOW (ref >59)

## 2023-09-22 LAB — HEMOGLOBIN A1C
Est. average glucose Bld gHb Est-mCnc: 148 mg/dL
Hgb A1c MFr Bld: 6.8 % — ABNORMAL HIGH (ref 4.8–5.6)

## 2023-09-23 NOTE — Telephone Encounter (Signed)
Closing encounter after multiple attempts to contact patient.  Application was mailed on 08/31/2023 and not returned

## 2023-09-27 ENCOUNTER — Ambulatory Visit (INDEPENDENT_AMBULATORY_CARE_PROVIDER_SITE_OTHER): Payer: Medicare Other | Admitting: Podiatry

## 2023-09-27 DIAGNOSIS — Z91199 Patient's noncompliance with other medical treatment and regimen due to unspecified reason: Secondary | ICD-10-CM

## 2023-09-27 NOTE — Progress Notes (Signed)
 1. No-show for appointment

## 2023-09-29 ENCOUNTER — Ambulatory Visit (INDEPENDENT_AMBULATORY_CARE_PROVIDER_SITE_OTHER): Payer: Medicare Other | Admitting: Podiatry

## 2023-09-29 ENCOUNTER — Encounter: Payer: Self-pay | Admitting: Podiatry

## 2023-09-29 ENCOUNTER — Other Ambulatory Visit (HOSPITAL_COMMUNITY): Payer: Self-pay

## 2023-09-29 VITALS — Ht 70.0 in

## 2023-09-29 DIAGNOSIS — M79674 Pain in right toe(s): Secondary | ICD-10-CM

## 2023-09-29 DIAGNOSIS — E1122 Type 2 diabetes mellitus with diabetic chronic kidney disease: Secondary | ICD-10-CM

## 2023-09-29 DIAGNOSIS — M79675 Pain in left toe(s): Secondary | ICD-10-CM

## 2023-09-29 DIAGNOSIS — N183 Chronic kidney disease, stage 3 unspecified: Secondary | ICD-10-CM

## 2023-09-29 DIAGNOSIS — L84 Corns and callosities: Secondary | ICD-10-CM

## 2023-09-29 DIAGNOSIS — B351 Tinea unguium: Secondary | ICD-10-CM

## 2023-09-29 DIAGNOSIS — Q828 Other specified congenital malformations of skin: Secondary | ICD-10-CM | POA: Diagnosis not present

## 2023-09-29 NOTE — Telephone Encounter (Addendum)
 Pt. Returned app to office and it was faxed in-refaxed Dr. Elnita Hai portion for review

## 2023-09-29 NOTE — Progress Notes (Signed)
 Subjective:  Patient ID: Alec Solis, male    DOB: 05/28/1933,  MRN: [id number]  89 y.o. male presents with at risk foot care. Pt has h/o NIDDM with chronic kidney disease and corn(s)  left foot, porokeratotic lesion(s) of both feet and painful mycotic nails. Painful toenails interfere with ambulation. Aggravating factors include wearing enclosed shoe gear. Pain is relieved with periodic professional debridement. Painful corns and porokeratotic lesion(s) aggravated when weightbearing with and without shoegear. Pain is relieved with periodic professional debridement. Chief Complaint  Patient presents with   [facility]    He is here for a nail trim, PCP is Dr. Jarold and seen last month. He does not rememver his last A1C and in epic was 6.8.     PCP: Jarold Medici, MD.  New problem(s): None.   Review of Systems: Negative except as noted in the HPI.   No Known Allergies  Objective:  There were no vitals filed for this visit. Constitutional Patient is a pleasant 89 y.o. male WD, WN in NAD. AAO x 3.  Vascular Capillary fill time to digits <3 seconds.  DP/PT pulse(s) are faintly palpable b/l lower extremities. Pedal hair absent b/l. Lower extremity skin temperature gradient warm to cool b/l. No pain with calf compression b/l. No cyanosis or clubbing noted. No ischemia nor gangrene noted b/l.   Neurologic Protective sensation intact 5/5 intact bilaterally with 10g monofilament b/l. Vibratory sensation intact b/l. No clonus b/l.   Dermatologic Pedal skin is thin, shiny and atrophic b/l.  No open wounds b/l lower extremities. No interdigital macerations b/l lower extremities. Toenails 1-5 b/l elongated, discolored, dystrophic, thickened, crumbly with subungual debris and tenderness to dorsal palpation. Hyperkeratotic lesion(s) left second digit.  No erythema, no edema, no drainage, no fluctuance. Porokeratotic lesion(s) R hallux and submet head 4 left foot. No erythema, no edema, no drainage, no  fluctuance.  Orthopedic: Normal muscle strength 5/5 to all lower extremity muscle groups bilaterally. HAV with bunion bilaterally and hammertoes 2-5 b/l.   Last HgA1c:     Latest Ref Rng & Units 09/21/2023   12:19 PM 05/19/2023    9:49 AM 02/16/2023    4:59 PM  Hemoglobin A1C  Hemoglobin-A1c 4.8 - 5.6 % 6.8  6.9  7.6    Assessment:   1. Pain due to onychomycosis of toenails of both feet   2. Corns   3. Porokeratosis   4. Diabetes mellitus with stage 3 chronic kidney disease (HCC)    Plan:  -Consent given for treatment as described below: -Examined patient. -Continue foot and shoe inspections daily. Monitor blood glucose per PCP/Endocrinologist's recommendations. -Patient to continue soft, supportive shoe gear daily. -Toenails 1-5 b/l were debrided in length and girth with sterile nail nippers and dremel without iatrogenic bleeding.  -Corn(s) left second digit pared utilizing sterile scalpel blade without complication or incident. Total number debrided=1. -Porokeratotic lesion(s) R hallux and submet head 4 left foot pared and enucleated with sterile currette without incident. Total number of lesions debrided=2. -Continue tube foam to afftected digit(s) daily for protection. -Patient/POA to call should there be question/concern in the interim.  Return in about 3 months (around 12/27/2023).  Delon LITTIE Merlin, [name]      [facility] LOCATION: [address]  [place], [place] [address]                   Office [phone]   [facility] LOCATION: [address] [place], [place] [address] Office [phone]

## 2023-09-30 NOTE — Telephone Encounter (Deleted)
 PAP: Application for Evaristo Bury has been submitted to [employer], via fax

## 2023-10-01 NOTE — Telephone Encounter (Signed)
 PAP: Application for Alec Solis has been submitted to [employer], via fax

## 2023-10-06 NOTE — Telephone Encounter (Signed)
PAP: Patient assistance application for Alec Solis has been approved by PAP Companies: [employer] from 09/03/2023 to 08/23/2024. Medication should be delivered to PAP Delivery: Provider's office. For further shipping updates, please [employer] at [phone]. Patient ID is: per automated system

## 2023-10-19 ENCOUNTER — Other Ambulatory Visit: Payer: Self-pay | Admitting: Internal Medicine

## 2023-10-22 ENCOUNTER — Telehealth: Payer: Self-pay

## 2023-10-22 NOTE — Telephone Encounter (Signed)
 Called patient to let know patient assistance was here and ready for pick up.

## 2023-11-11 DIAGNOSIS — N2581 Secondary hyperparathyroidism of renal origin: Secondary | ICD-10-CM | POA: Diagnosis not present

## 2023-11-11 DIAGNOSIS — I129 Hypertensive chronic kidney disease with stage 1 through stage 4 chronic kidney disease, or unspecified chronic kidney disease: Secondary | ICD-10-CM | POA: Diagnosis not present

## 2023-11-11 DIAGNOSIS — D631 Anemia in chronic kidney disease: Secondary | ICD-10-CM | POA: Diagnosis not present

## 2023-11-11 DIAGNOSIS — N183 Chronic kidney disease, stage 3 unspecified: Secondary | ICD-10-CM | POA: Diagnosis not present

## 2023-11-11 DIAGNOSIS — E1122 Type 2 diabetes mellitus with diabetic chronic kidney disease: Secondary | ICD-10-CM | POA: Diagnosis not present

## 2023-11-11 DIAGNOSIS — M109 Gout, unspecified: Secondary | ICD-10-CM | POA: Diagnosis not present

## 2023-12-01 ENCOUNTER — Encounter (HOSPITAL_COMMUNITY): Payer: Self-pay | Admitting: *Deleted

## 2023-12-01 ENCOUNTER — Ambulatory Visit (HOSPITAL_COMMUNITY)
Admission: EM | Admit: 2023-12-01 | Discharge: 2023-12-01 | Disposition: A | Discharge status: Home / Self Care | Attending: Emergency Medicine | Admitting: Emergency Medicine

## 2023-12-01 ENCOUNTER — Telehealth: Payer: Self-pay

## 2023-12-01 ENCOUNTER — Ambulatory Visit (INDEPENDENT_AMBULATORY_CARE_PROVIDER_SITE_OTHER)

## 2023-12-01 ENCOUNTER — Other Ambulatory Visit: Payer: Self-pay

## 2023-12-01 DIAGNOSIS — J441 Chronic obstructive pulmonary disease with (acute) exacerbation: Secondary | ICD-10-CM | POA: Diagnosis not present

## 2023-12-01 DIAGNOSIS — J432 Centrilobular emphysema: Secondary | ICD-10-CM

## 2023-12-01 DIAGNOSIS — R051 Acute cough: Secondary | ICD-10-CM

## 2023-12-01 DIAGNOSIS — R059 Cough, unspecified: Secondary | ICD-10-CM | POA: Diagnosis not present

## 2023-12-01 MED ORDER — DEXAMETHASONE SODIUM PHOSPHATE 10 MG/ML IJ SOLN
10.0000 mg | Freq: Once | INTRAMUSCULAR | Status: AC
  Administered 2023-12-01: 10 mg via INTRAMUSCULAR

## 2023-12-01 MED ORDER — DEXAMETHASONE SODIUM PHOSPHATE 10 MG/ML IJ SOLN
INTRAMUSCULAR | Status: AC
  Filled 2023-12-01: qty 1

## 2023-12-01 MED ORDER — AMOXICILLIN-POT CLAVULANATE 875-125 MG PO TABS: 1.0000 | ORAL_TABLET | Freq: Two times a day (BID) | ORAL | 0 refills | Status: AC

## 2023-12-01 MED ORDER — BENZONATATE 100 MG PO CAPS: 100.0000 mg | ORAL_CAPSULE | Freq: Three times a day (TID) | ORAL | 0 refills | Status: AC

## 2023-12-01 NOTE — Telephone Encounter (Signed)
 Called patient to inform their  TRESIBA patient assistance  is ready for pick up, patient aware.

## 2023-12-01 NOTE — ED Triage Notes (Signed)
 PT reports a productive cough for one week . Cough worse at night.

## 2023-12-01 NOTE — Telephone Encounter (Signed)
 Patient Assistance Medication Pick Up  12/01/23 @3 :30  2 boxes  Insulin Degludec 3x1ml NDC [phone]

## 2023-12-01 NOTE — ED Provider Notes (Addendum)
 [facility]    CSN: [id number] Arrival date & time: 12/01/23  1008      History   Chief Complaint Chief Complaint  Patient presents with   Cough    HPI Alec Solis is a 89 y.o. male.   Patient presents to clinic over concerns of a productive cough for the past week.  Cough is worse at night.  Does have a history of emphysema and chronic lung disease, will normally have a productive cough with clear phlegm, the phlegm has changed to yellow.  He reports shortness of breath and wheezing when laying flat.  Had seen pulmonology in the past with they recommended Delsym.  He has been taking this daily as prescribed without any improvement.  He has not had any fevers.  Denies emesis or diarrhea.  The history is provided by the patient and medical records.  Cough   Past Medical History:  Diagnosis Date   Anemia    Anxiety    Arthritis    Cataract    Chronic kidney disease    Depression    Diabetes mellitus without complication (HCC)    type II    GERD (gastroesophageal reflux disease)    Gout    Heart murmur    Hypertension    Myocardial infarction (HCC)    minor Mi- years ago -    Varicose veins of left lower extremity     Patient Active Problem List   Diagnosis Date Noted   Anemia in stage 3b chronic kidney disease (HCC) 09/21/2023   Centrilobular emphysema (HCC) 09/21/2023   Stage 3b chronic kidney disease (HCC) 07/23/2023   Atypical chest pain 07/23/2023   Hypoglycemia 04/07/2023   Immunization due 04/07/2023   Gout 04/07/2023   Hypertensive nephropathy 09/20/2022   Mixed hyperlipidemia 09/20/2022   Lung nodule 09/20/2022   BMI 25.0-25.9,adult 09/20/2022   PVC (premature ventricular contraction) 11/21/2021   Precordial chest pain 11/21/2021   Abnormal feces 03/20/2021   Abnormal weight loss 03/20/2021   Diarrhea 03/20/2021   Diverticular disease of colon 03/20/2021   Dysphagia 03/20/2021   Epigastric pain 03/20/2021   Hemorrhage of rectum and  anus 03/20/2021   Iron deficiency anemia 03/20/2021   Nausea and vomiting 03/20/2021   Black stools 03/06/2020   Gastroesophageal reflux disease without esophagitis 03/06/2020   Paget's disease of bone 10/23/2018   Acute renal failure (ARF) (HCC) 06/11/2016   Hyperglycemia 06/11/2016   Deficiency anemia 12/09/2011   Type 2 diabetes mellitus with stage 3b chronic kidney disease, with long-term current use of insulin (HCC) 12/09/2011   Renal insufficiency 12/09/2011    Past Surgical History:  Procedure Laterality Date   BUNIONECTOMY     cataract     COLONOSCOPY WITH PROPOFOL N/A 05/28/2017   Procedure: COLONOSCOPY WITH PROPOFOL;  Surgeon: Jeani Hawking, MD;  Location: [facility];  Service: Endoscopy;  Laterality: N/A;   COLONOSCOPY WITH PROPOFOL N/A 03/29/2020   Procedure: COLONOSCOPY WITH PROPOFOL;  Surgeon: Jeani Hawking, MD;  Location: [facility];  Service: Endoscopy;  Laterality: N/A;   ENTEROSCOPY N/A 03/29/2020   Procedure: ENTEROSCOPY;  Surgeon: Jeani Hawking, MD;  Location: [facility];  Service: Endoscopy;  Laterality: N/A;   ESOPHAGOGASTRODUODENOSCOPY (EGD) WITH PROPOFOL N/A 05/28/2017   Procedure: ESOPHAGOGASTRODUODENOSCOPY (EGD) WITH PROPOFOL;  Surgeon: Jeani Hawking, MD;  Location: [facility];  Service: Endoscopy;  Laterality: N/A;   HEMOSTASIS CLIP PLACEMENT  03/29/2020   Procedure: HEMOSTASIS CLIP PLACEMENT;  Surgeon: Jeani Hawking, MD;  Location: [facility];  Service:  Endoscopy;;   HOT HEMOSTASIS N/A 03/29/2020   Procedure: HOT HEMOSTASIS (ARGON PLASMA COAGULATION/BICAP);  Surgeon: Jeani Hawking, MD;  Location: [facility];  Service: Endoscopy;  Laterality: N/A;   POLYPECTOMY  03/29/2020   Procedure: POLYPECTOMY;  Surgeon: Jeani Hawking, MD;  Location: [facility];  Service: Endoscopy;;       Home Medications    Prior to Admission medications   Medication Sig Start Date End Date Taking? Authorizing Provider  acetaminophen (TYLENOL) 650 MG CR tablet Take 650 mg by  mouth every 8 (eight) hours as needed for pain.    Yes [provider]  allopurinol (ZYLOPRIM) 100 MG tablet Take 1 tablet (100 mg total) by mouth 2 (two) times daily. 03/29/23  Yes Ellender Hose, NP  amLODipine (NORVASC) 5 MG tablet TAKE 1 TABLET(5 MG) BY MOUTH DAILY 03/29/23  Yes Ellender Hose, NP  amoxicillin-clavulanate (AUGMENTIN) 875-125 MG tablet Take 1 tablet by mouth every 12 (twelve) hours for 5 days. 12/01/23 12/06/23 Yes Rinaldo Ratel, [place] N, FNP  aspirin 81 MG tablet Take 81 mg by mouth daily.    Yes [provider]  atorvastatin (LIPITOR) 20 MG tablet Take 1 tablet (20 mg total) by mouth daily. 05/19/23 05/18/24 Yes Dorothyann Peng, MD  B-D ULTRAFINE III SHORT PEN 31G X 8 MM MISC USE AS DIRECTED FOUR TIMES DAILY 01/10/19  Yes Dorothyann Peng, MD  benzonatate (TESSALON) 100 MG capsule Take 1 capsule (100 mg total) by mouth every 8 (eight) hours. 12/01/23  Yes Rinaldo Ratel, [place] N, FNP  calcitRIOL (ROCALTROL) 0.25 MCG capsule Take 0.25 mcg by mouth every other day.    Yes [provider]  carboxymethylcellulose (REFRESH PLUS) 0.5 % SOLN Place 1 drop into both eyes 3 (three) times daily as needed (Watery eyes).    Yes [provider]  carvedilol (COREG) 12.5 MG tablet Take 12.5 mg by mouth 2 (two) times daily with a meal.   Yes [provider]  ferrous sulfate 325 (65 FE) MG tablet Take 325 mg by mouth daily with breakfast.   Yes [provider]  fluticasone (FLONASE) 50 MCG/ACT nasal spray Place 2 sprays into both nostrils daily. 05/18/22  Yes Arnette Felts, FNP  furosemide (LASIX) 40 MG tablet Take 40 mg by mouth daily as needed for fluid or edema. 2 Tablets by mouth daily as needed   Yes [provider]  Glucagon (GVOKE HYPOPEN 2-PACK) 0.5 MG/0.1ML SOAJ Inject 0.1 mLs into the skin as needed (inject SQ under the skin for BS less than 70). 03/29/23  Yes Ellender Hose, NP  glucose blood (TRUE METRIX BLOOD GLUCOSE TEST) test strip Use as directed to  check blood sugars 3 times per day dx: e11.65 10/01/20  Yes Dorothyann Peng, MD  insulin degludec (TRESIBA FLEXTOUCH) 200 UNIT/ML FlexTouch Pen Inject 18 Units into the skin daily. 03/11/22  Yes Dorothyann Peng, MD  Insulin Pen Needle 32G X 8 MM MISC Use as directed 03/26/22  Yes Dorothyann Peng, MD  Omega-3 Fatty Acids (FISH OIL OMEGA-3 PO) Take 1,200 mg by mouth daily.    Yes [provider]  ondansetron (ZOFRAN) 4 MG tablet TAKE 1 TABLET(4 MG) BY MOUTH DAILY AS NEEDED FOR NAUSEA OR VOMITING 10/19/23  Yes Dorothyann Peng, MD  pantoprazole (PROTONIX) 40 MG tablet TAKE 1 TABLET(40 MG) BY MOUTH DAILY 10/19/23  Yes Dorothyann Peng, MD  sodium bicarbonate 650 MG tablet Take 1,300 mg by mouth 2 (two) times daily.   Yes [provider]  TRUEplus Lancets 30G MISC USE  AS DIRECTED TO TEST BLOOD SUGAR THREE TIMES DAILY 10/01/20  Yes Dorothyann Peng, MD  vitamin B-12 (CYANOCOBALAMIN) 1000 MCG tablet Take 1,000 mcg by mouth daily.    [provider]    Family History Family History  Problem Relation Age of Onset   Breast cancer Mother    Stomach cancer Mother    Diabetes Mellitus II Maternal Aunt    Lung cancer Neg Hx    Lung disease Neg Hx     Social History Social History   Tobacco Use   Smoking status: Some Days    Current packs/day: 0.00    Average packs/day: 0.5 packs/day for 62.0 years (31.0 ttl pk-yrs)    Types: Cigarettes    Start date: 08/24/1950    Last attempt to quit: 08/24/2014    Years since quitting: 9.2   Smokeless tobacco: Never  Vaping Use   Vaping status: Never Used  Substance Use Topics   Alcohol use: Yes    Comment: occassional wine beer   Drug use: No     Allergies   Patient has no known allergies.   Review of Systems Review of Systems  Per HPI  Physical Exam Triage Vital Signs ED Triage Vitals  Encounter Vitals Group     BP 12/01/23 1229 132/75     Systolic BP Percentile --      Diastolic BP Percentile --      Pulse Rate 12/01/23 1229 77      Resp 12/01/23 1229 18     Temp 12/01/23 1229 98.1 F (36.7 C)     Temp src --      SpO2 12/01/23 1229 98 %     Weight --      Height --      Head Circumference --      Peak Flow --      Pain Score 12/01/23 1227 0     Pain Loc --      Pain Education --      Exclude from Growth Chart --    No data found.  Updated Vital Signs BP 132/75   Pulse 77   Temp 98.1 F (36.7 C)   Resp 18   SpO2 98%   Visual Acuity Right Eye Distance:   Left Eye Distance:   Bilateral Distance:    Right Eye Near:   Left Eye Near:    Bilateral Near:     Physical Exam Vitals and nursing note reviewed.  Constitutional:      Appearance: Normal appearance.  HENT:     Head: Normocephalic.     Right Ear: External ear normal.     Left Ear: External ear normal.     Nose: Nose normal.     Mouth/Throat:     Mouth: Mucous membranes are moist.  Eyes:     Conjunctiva/sclera: Conjunctivae normal.  Cardiovascular:     Rate and Rhythm: Normal rate and regular rhythm.     Heart sounds: Normal heart sounds. No murmur heard. Pulmonary:     Effort: Pulmonary effort is normal. No respiratory distress.     Breath sounds: Decreased air movement present.  Neurological:     General: No focal deficit present.     Mental Status: He is alert.  Psychiatric:        Mood and Affect: Mood normal.      UC Treatments / Results  Labs (all labs ordered are listed, but only abnormal results are displayed) Labs Reviewed - No data  to display  EKG   Radiology No results found.  Procedures Procedures (including critical care time)  Medications Ordered in UC Medications  dexamethasone (DECADRON) injection 10 mg (has no administration in time range)    Initial Impression / Assessment and Plan / UC Course  I have reviewed the triage vital signs and the nursing notes.  Pertinent labs & imaging results that were available during my care of the patient were reviewed by me and considered in my medical  decision making (see chart for details).  Vitals in triage reviewed, patient is hemodynamically stable.  Heart with regular rate and rhythm, lungs overall are diminished.  Chest x-ray consistent with previous, no obvious pneumonia or infiltrate by my interpretation.  Radiology interpretation is pending.  Due to history of COPD, will cover with Augmentin.  Patient declined oral steroids, given IM Decadron in clinic.  Plan of care, follow-up care and strict emergency precautions given if symptoms evolve.  No questions at this time.  Radiology overread shows no acute cardiopulmonary disease, no updates to treatment plan at this time.     Final Clinical Impressions(s) / UC Diagnoses   Final diagnoses:  Acute cough  COPD exacerbation (HCC)  Centrilobular emphysema (HCC)     Discharge Instructions      Take the antibiotics twice daily for the next 5 days.  Take them with food to help avoid stomach upset.  Use the Tessalon Perles every 8 hours to help suppress your cough.   We have given you a steroid injection to help with the inflammation in your lungs.  If you do not have any improvement over the next 2 to 3 days please follow-up with your primary care doctor or your pulmonologist.  Seek immediate care for any worsening shortness of breath, trouble breathing, or any new concerning symptoms.      ED Prescriptions     Medication Sig Dispense Auth. Provider   benzonatate (TESSALON) 100 MG capsule Take 1 capsule (100 mg total) by mouth every 8 (eight) hours. 21 capsule Rinaldo Ratel, [place] N, [place]   amoxicillin-clavulanate (AUGMENTIN) 875-125 MG tablet Take 1 tablet by mouth every 12 (twelve) hours for 5 days. 10 tablet Remmy Riffe, [place] N, FNP      PDMP not reviewed this encounter.   Jontue Crumpacker, [place] N, FNP 12/01/23 1332    Jaydin Boniface, [place] N, [place] 12/01/23 1450

## 2023-12-01 NOTE — Discharge Instructions (Signed)
 Take the antibiotics twice daily for the next 5 days.  Take them with food to help avoid stomach upset.  Use the Tessalon Perles every 8 hours to help suppress your cough.   We have given you a steroid injection to help with the inflammation in your lungs.  If you do not have any improvement over the next 2 to 3 days please follow-up with your primary care doctor or your pulmonologist.  Seek immediate care for any worsening shortness of breath, trouble breathing, or any new concerning symptoms.

## 2023-12-11 ENCOUNTER — Other Ambulatory Visit: Payer: Self-pay | Admitting: Internal Medicine

## 2023-12-20 ENCOUNTER — Encounter: Payer: Self-pay | Admitting: Internal Medicine

## 2023-12-20 ENCOUNTER — Ambulatory Visit (INDEPENDENT_AMBULATORY_CARE_PROVIDER_SITE_OTHER): Payer: Medicare Other | Admitting: Internal Medicine

## 2023-12-20 VITALS — BP 110/72 | HR 71 | Temp 98.6°F | Ht 70.0 in | Wt 180.8 lb

## 2023-12-20 DIAGNOSIS — D631 Anemia in chronic kidney disease: Secondary | ICD-10-CM | POA: Diagnosis not present

## 2023-12-20 DIAGNOSIS — Z794 Long term (current) use of insulin: Secondary | ICD-10-CM

## 2023-12-20 DIAGNOSIS — F17200 Nicotine dependence, unspecified, uncomplicated: Secondary | ICD-10-CM | POA: Insufficient documentation

## 2023-12-20 DIAGNOSIS — I129 Hypertensive chronic kidney disease with stage 1 through stage 4 chronic kidney disease, or unspecified chronic kidney disease: Secondary | ICD-10-CM

## 2023-12-20 DIAGNOSIS — E1122 Type 2 diabetes mellitus with diabetic chronic kidney disease: Secondary | ICD-10-CM | POA: Diagnosis not present

## 2023-12-20 DIAGNOSIS — J432 Centrilobular emphysema: Secondary | ICD-10-CM | POA: Diagnosis not present

## 2023-12-20 DIAGNOSIS — M1A3790 Chronic gout due to renal impairment, unspecified ankle and foot, without tophus (tophi): Secondary | ICD-10-CM

## 2023-12-20 DIAGNOSIS — R202 Paresthesia of skin: Secondary | ICD-10-CM | POA: Diagnosis not present

## 2023-12-20 DIAGNOSIS — E782 Mixed hyperlipidemia: Secondary | ICD-10-CM

## 2023-12-20 DIAGNOSIS — N1832 Chronic kidney disease, stage 3b: Secondary | ICD-10-CM | POA: Diagnosis not present

## 2023-12-20 NOTE — Assessment & Plan Note (Signed)
 Chronic, also followed by Renal. He reports compliance with oral iron supplementation. Hb goal is 10.

## 2023-12-20 NOTE — Progress Notes (Signed)
 I,Victoria T Basil Lim, CMA,acting as a Neurosurgeon for Smiley Dung, MD.,have documented all relevant documentation on the behalf of Smiley Dung, MD,as directed by  Smiley Dung, MD while in the presence of Smiley Dung, MD.  Subjective:  Patient ID: Alec Solis , male    DOB: [birth year] , 89 y.o.   MRN: [id number]  Chief Complaint  Patient presents with   Hypertension    Patient presents today for bp, diabetes & cholesterol follow up. He reports compliance with medications. Denies headache, chest pain & sob. While here today he would like to go over xray results from visiting ED on 04/09.    Diabetes   Hyperlipidemia    HPI Discussed the use of AI scribe software for clinical note transcription with the patient, who gave verbal consent to proceed.  History of Present Illness Alec Solis is an 89 year old male with hypertension and diabetes who presents for a blood pressure and diabetes check.  He is currently on amlodipine  5 mg daily for hypertension and Tresiba  18 units at night for diabetes. Recently, he experienced elevated blood sugar levels following a steroid injection for COPD-related breathing issues at urgent care a few weeks ago. His blood sugar has since stabilized, with a morning reading of 104 mg/dL.CXR was performed, no acute lung findings were noted.   He takes allopurinol  100 mg daily to prevent gout, which typically affects his big toes. He experiences occasional shooting pain in his right foot, described as 'like somebody's got a pen and he's sticking it in my foot,' indicating possible neuropathy. No persistent numbness or tingling is reported.  Of note, shooting pain is in his foot, not in his toes. Denies recent fall/trauma.   He is under the care of a kidney specialist and takes calcitriol  every other day and Lasix 40 mg daily as needed. He also takes iron supplements and has adjusted his B12 intake to Monday, Wednesday, and Friday due to previously high  levels.  He sees a foot doctor regularly and has an appointment next month. His breathing has improved significantly after the steroid shot and antibiotics. He reports wheezing and breathing difficulties related to COPD, which improved after the steroid shot.   Diabetes He presents for his follow-up diabetic visit. He has type 2 diabetes mellitus. His disease course has been improving. There are no hypoglycemic associated symptoms. Pertinent negatives for hypoglycemia include no dizziness or headaches. Pertinent negatives for diabetes include no blurred vision, no chest pain, no fatigue, no polydipsia, no polyphagia and no polyuria. There are no hypoglycemic complications. Symptoms are improving. There are no diabetic complications. Risk factors for coronary artery disease include sedentary lifestyle, male sex, diabetes mellitus and hypertension. Current diabetic treatment includes insulin  injections. He is compliant with treatment most of the time. Meal planning includes avoidance of concentrated sweets. He participates in exercise intermittently. His home blood glucose trend is decreasing steadily. (Blood sugars are running lower down to 86, he is currently taking 20 units nightly) Eye exam is current.  Hypertension This is a chronic problem. The current episode started more than 1 year ago. The problem has been gradually improving since onset. The problem is controlled. Pertinent negatives include no blurred vision, chest pain, headaches, palpitations or shortness of breath. Risk factors for coronary artery disease include diabetes mellitus, dyslipidemia, sedentary lifestyle and male gender. Identifiable causes of hypertension include chronic renal disease.  Hyperlipidemia This is a chronic problem. Exacerbating diseases include chronic renal disease and diabetes. Pertinent negatives  include no chest pain or shortness of breath. Current antihyperlipidemic treatment includes statins. Compliance problems  include adherence to exercise.  Risk factors for coronary artery disease include diabetes mellitus, dyslipidemia, male sex, hypertension and a sedentary lifestyle.     Past Medical History:  Diagnosis Date   Anemia    Anxiety    Arthritis    Cataract    Chronic kidney disease    Depression    Diabetes mellitus without complication (HCC)    type II    GERD (gastroesophageal reflux disease)    Gout    Heart murmur    Hypertension    Myocardial infarction (HCC)    minor Mi- years ago -    Varicose veins of left lower extremity      Family History  Problem Relation Age of Onset   Breast cancer Mother    Stomach cancer Mother    Diabetes Mellitus II Maternal Aunt    Lung cancer Neg Hx    Lung disease Neg Hx      Current Outpatient Medications:    acetaminophen  (TYLENOL ) 650 MG CR tablet, Take 650 mg by mouth every 8 (eight) hours as needed for pain. , Disp: , Rfl:    allopurinol  (ZYLOPRIM ) 100 MG tablet, Take 1 tablet (100 mg total) by mouth 2 (two) times daily., Disp: 180 tablet, Rfl: 1   amLODipine  (NORVASC ) 5 MG tablet, TAKE 1 TABLET(5 MG) BY MOUTH DAILY, Disp: 90 tablet, Rfl: 1   aspirin  81 MG tablet, Take 81 mg by mouth daily. , Disp: , Rfl:    atorvastatin  (LIPITOR) 20 MG tablet, Take 1 tablet (20 mg total) by mouth daily., Disp: 30 tablet, Rfl: 11   B-D ULTRAFINE III SHORT PEN 31G X 8 MM MISC, USE AS DIRECTED FOUR TIMES DAILY, Disp: 100 each, Rfl: 3   benzonatate  (TESSALON ) 100 MG capsule, Take 1 capsule (100 mg total) by mouth every 8 (eight) hours., Disp: 21 capsule, Rfl: 0   calcitRIOL  (ROCALTROL ) 0.25 MCG capsule, Take 0.25 mcg by mouth every other day. , Disp: , Rfl:    carboxymethylcellulose (REFRESH PLUS) 0.5 % SOLN, Place 1 drop into both eyes 3 (three) times daily as needed (Watery eyes). , Disp: , Rfl:    carvedilol  (COREG ) 12.5 MG tablet, Take 12.5 mg by mouth 2 (two) times daily with a meal., Disp: , Rfl:    ferrous sulfate 325 (65 FE) MG tablet, Take 325 mg  by mouth daily with breakfast., Disp: , Rfl:    fluticasone  (FLONASE ) 50 MCG/ACT nasal spray, Place 2 sprays into both nostrils daily., Disp: 16 g, Rfl: 2   furosemide (LASIX) 40 MG tablet, Take 40 mg by mouth daily as needed for fluid or edema. 2 Tablets by mouth daily as needed, Disp: , Rfl:    Glucagon  (GVOKE HYPOPEN  2-PACK) 0.5 MG/0.1ML SOAJ, Inject 0.1 mLs into the skin as needed (inject SQ under the skin for BS less than 70)., Disp: 0.2 mL, Rfl: 1   glucose blood (TRUE METRIX BLOOD GLUCOSE TEST) test strip, Use as directed to check blood sugars 3 times per day dx: e11.65, Disp: 300 each, Rfl: 5   insulin  degludec (TRESIBA  FLEXTOUCH) 200 UNIT/ML FlexTouch Pen, Inject 18 Units into the skin daily., Disp: 27 mL, Rfl: 1   Insulin  Pen Needle 32G X 8 MM MISC, Use as directed, Disp: 100 each, Rfl: 2   Omega-3 Fatty Acids (FISH OIL OMEGA-3 PO), Take 1,200 mg by mouth daily. , Disp: , Rfl:  ondansetron  (ZOFRAN ) 4 MG tablet, TAKE 1 TABLET(4 MG) BY MOUTH DAILY AS NEEDED FOR NAUSEA OR VOMITING, Disp: 30 tablet, Rfl: 0   pantoprazole  (PROTONIX ) 40 MG tablet, TAKE 1 TABLET(40 MG) BY MOUTH DAILY, Disp: 90 tablet, Rfl: 0   sodium bicarbonate  650 MG tablet, Take 1,300 mg by mouth 2 (two) times daily., Disp: , Rfl:    TRUEplus Lancets 30G MISC, USE AS DIRECTED TO TEST BLOOD SUGAR THREE TIMES DAILY, Disp: 300 each, Rfl: 11   vitamin B-12 (CYANOCOBALAMIN ) 1000 MCG tablet, Take 1,000 mcg by mouth daily., Disp: , Rfl:    No Known Allergies   Review of Systems  Constitutional: Negative.  Negative for fatigue.  HENT: Negative.    Eyes:  Negative for blurred vision.  Respiratory: Negative.  Negative for shortness of breath.        Recently seen at urgent care, diagnosed with COPD exacerbation. Had steroid shot which elevated his sugars.   Cardiovascular: Negative.  Negative for chest pain and palpitations.  Gastrointestinal: Negative.   Endocrine: Negative for polydipsia, polyphagia and polyuria.  Skin:  Negative.   Allergic/Immunologic: Negative.   Neurological: Negative.  Negative for dizziness and headaches.       He c/o intermitting tingling in his right foot. Unable to identify known triggers. Does not bother him daily.   Hematological: Negative.      Today's Vitals   12/20/23 1155  BP: 110/72  Pulse: 71  Temp: 98.6 F (37 C)  SpO2: 98%  Weight: 180 lb 12.8 oz (82 kg)  Height: 5\' 10"  (1.778 m)   Body mass index is 25.94 kg/m.  Wt Readings from Last 3 Encounters:  12/20/23 180 lb 12.8 oz (82 kg)  09/21/23 184 lb 12.8 oz (83.8 kg)  07/19/23 181 lb 3.2 oz (82.2 kg)     Objective:  Physical Exam Vitals and nursing note reviewed.  Constitutional:      Appearance: Normal appearance.  HENT:     Head: Normocephalic and atraumatic.  Eyes:     Extraocular Movements: Extraocular movements intact.  Cardiovascular:     Rate and Rhythm: Normal rate and regular rhythm.     Heart sounds: Normal heart sounds.  Pulmonary:     Effort: Pulmonary effort is normal.     Breath sounds: Normal breath sounds.  Musculoskeletal:     Cervical back: Normal range of motion.  Skin:    General: Skin is warm.  Neurological:     General: No focal deficit present.     Mental Status: He is alert.  Psychiatric:        Mood and Affect: Mood normal.         Assessment And Plan:  Hypertensive nephropathy Assessment & Plan: Chronic, well controlled.  He will c/w amlodipine  and carvedilol . Also followed by Renal. Not currently on AceI or ARB.  He is encouraged to follow low sodium diet.      Type 2 diabetes mellitus with stage 3b chronic kidney disease, with long-term current use of insulin  (HCC) Assessment & Plan: Diabetes managed with Tresiba . Steroid-induced hyperglycemia resolved. Neuropathy likely exacerbated by hyperglycemia. Elevated B12 levels indicate non-B12 deficiency neuropathy. - Monitor blood glucose closely. - Adjust B12 supplementation to Monday, Wednesday, Friday. -  Nephrology input is appreciated - Regarding CKD, he is encouraged to stay well hydrated, avoid NSAIDs and keep BP controlled to prevent progression of CKD.    Orders: -     CMP14+EGFR -     PTH, intact and  calcium  -     Phosphorus -     Protein electrophoresis, serum -     Hemoglobin A1c  Mixed hyperlipidemia Assessment & Plan: Chronic, LDL goal is less than 70. He is now on atorvastatin . Will check fasting lipid panel at his next visit. I will adjust dose as needed.    Paresthesia of right foot Assessment & Plan: Sx suggestive of diabetic neuropathy. However, this is less likely since his sx are in his midfoot and sx do NOT start at his toes. Will continue to follow his sx. He is also encouraged to keep track of triggers to see if there is a pattern when sx occur.   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 No acute flares reported. - Check uric acid levels.  Orders: -     Uric acid  Anemia in stage 3b chronic kidney disease (HCC) Assessment & Plan: Chronic, also followed by Renal. He reports compliance with oral iron supplementation. Hb goal is 10.    Centrilobular emphysema (HCC) Assessment & Plan: Chronic, he had recent flare due to URI. Sx improved with use of steroids. CXR results reviewed.  Pulmonary input appreciated. Most recent Pulmonary notes reviewed, since he has been asymptomatic, he only requires prn Pulmonary follow up at this time. Most recent CT chest reviewed, he does have 4mm nodules, based on Fleischner society guidelines, these require no further follow up.    Tobacco dependence with current use -     VAS US  AAA DUPLEX; Future -     VAS US  LOWER EXT ART SEG MULTI (SEGMENTALS & LE RAYNAUDS); Future    Return for 4 month bp & dm.  Patient was given opportunity to ask questions. Patient verbalized understanding of the plan and was  able to repeat key elements of the plan. All questions were answered to their satisfaction.    I, Smiley Dung, MD, have reviewed all documentation for this visit. The documentation on 12/20/23 for the exam, diagnosis, procedures, and orders are all accurate and complete.   IF YOU HAVE BEEN REFERRED TO A SPECIALIST, IT MAY TAKE 1-2 WEEKS TO SCHEDULE/PROCESS THE REFERRAL. IF YOU HAVE NOT HEARD FROM US /SPECIALIST IN TWO WEEKS, PLEASE GIVE US  A CALL AT [phone].   THE PATIENT IS ENCOURAGED TO PRACTICE SOCIAL DISTANCING DUE TO THE COVID-19 PANDEMIC.

## 2023-12-20 NOTE — Patient Instructions (Signed)
 Hypertension, Adult Hypertension is another name for high blood pressure. High blood pressure forces your heart to work harder to pump blood. This can cause problems over time. There are two numbers in a blood pressure reading. There is a top number (systolic) over a bottom number (diastolic). It is best to have a blood pressure that is below 120/80. What are the causes? The cause of this condition is not known. Some other conditions can lead to high blood pressure. What increases the risk? Some lifestyle factors can make you more likely to develop high blood pressure: Smoking. Not getting enough exercise or physical activity. Being overweight. Having too much fat, sugar, calories, or salt (sodium) in your diet. Drinking too much alcohol. Other risk factors include: Having any of these conditions: Heart disease. Diabetes. High cholesterol. Kidney disease. Obstructive sleep apnea. Having a family history of high blood pressure and high cholesterol. Age. The risk increases with age. Stress. What are the signs or symptoms? High blood pressure may not cause symptoms. Very high blood pressure (hypertensive crisis) may cause: Headache. Fast or uneven heartbeats (palpitations). Shortness of breath. Nosebleed. Vomiting or feeling like you may vomit (nauseous). Changes in how you see. Very bad chest pain. Feeling dizzy. Seizures. How is this treated? This condition is treated by making healthy lifestyle changes, such as: Eating healthy foods. Exercising more. Drinking less alcohol. Your doctor may prescribe medicine if lifestyle changes do not help enough and if: Your top number is above 130. Your bottom number is above 80. Your personal target blood pressure may vary. Follow these instructions at home: Eating and drinking  If told, follow the DASH eating plan. To follow this plan: Fill one half of your plate at each meal with fruits and vegetables. Fill one fourth of your plate  at each meal with whole grains. Whole grains include whole-wheat pasta, brown rice, and whole-grain bread. Eat or drink low-fat dairy products, such as skim milk or low-fat yogurt. Fill one fourth of your plate at each meal with low-fat (lean) proteins. Low-fat proteins include fish, chicken without skin, eggs, beans, and tofu. Avoid fatty meat, cured and processed meat, or chicken with skin. Avoid pre-made or processed food. Limit the amount of salt in your diet to less than 1,500 mg each day. Do not drink alcohol if: Your doctor tells you not to drink. You are pregnant, may be pregnant, or are planning to become pregnant. If you drink alcohol: Limit how much you have to: 0-1 drink a day for women. 0-2 drinks a day for men. Know how much alcohol is in your drink. In the [place] one drink equals one 12 oz bottle of beer (355 mL), one 5 oz glass of wine (148 mL), or one 1 oz glass of hard liquor (44 mL). Lifestyle  Work with your doctor to stay at a healthy weight or to lose weight. Ask your doctor what the best weight is for you. Get at least 30 minutes of exercise that causes your heart to beat faster (aerobic exercise) most days of the week. This may include walking, swimming, or biking. Get at least 30 minutes of exercise that strengthens your muscles (resistance exercise) at least 3 days a week. This may include lifting weights or doing Pilates. Do not smoke or use any products that contain nicotine or tobacco. If you need help quitting, ask your doctor. Check your blood pressure at home as told by your doctor. Keep all follow-up visits. Medicines Take over-the-counter and prescription medicines  only as told by your doctor. Follow directions carefully. Do not skip doses of blood pressure medicine. The medicine does not work as well if you skip doses. Skipping doses also puts you at risk for problems. Ask your doctor about side effects or reactions to medicines that you should watch  for. Contact a doctor if: You think you are having a reaction to the medicine you are taking. You have headaches that keep coming back. You feel dizzy. You have swelling in your ankles. You have trouble with your vision. Get help right away if: You get a very bad headache. You start to feel mixed up (confused). You feel weak or numb. You feel faint. You have very bad pain in your: Chest. Belly (abdomen). You vomit more than once. You have trouble breathing. These symptoms may be an emergency. Get help right away. Call 911. Do not wait to see if the symptoms will go away. Do not drive yourself to the hospital. Summary Hypertension is another name for high blood pressure. High blood pressure forces your heart to work harder to pump blood. For most people, a normal blood pressure is less than 120/80. Making healthy choices can help lower blood pressure. If your blood pressure does not get lower with healthy choices, you may need to take medicine. This information is not intended to replace advice given to you by your health care provider. Make sure you discuss any questions you have with your health care provider. Document Revised: 05/29/2021 Document Reviewed: 05/29/2021 Elsevier Patient Education  2024 [employer].

## 2023-12-20 NOTE — Assessment & Plan Note (Signed)
 Chronic, LDL goal is less than 70. He is now on atorvastatin . Will check fasting lipid panel at his next visit. I will adjust dose as needed.

## 2023-12-20 NOTE — Assessment & Plan Note (Signed)
 Chronic, he had recent flare due to URI. Sx improved with use of steroids. CXR results reviewed.  Pulmonary input appreciated. Most recent Pulmonary notes reviewed, since he has been asymptomatic, he only requires prn Pulmonary follow up at this time. Most recent CT chest reviewed, he does have 4mm nodules, based on Fleischner society guidelines, these require no further follow up.

## 2023-12-20 NOTE — Assessment & Plan Note (Signed)
 Chronic, managed with allopurinol . No acute flares reported. - Check uric acid levels.

## 2023-12-20 NOTE — Assessment & Plan Note (Signed)
 Sx suggestive of diabetic neuropathy. However, this is less likely since his sx are in his midfoot and sx do NOT start at his toes. Will continue to follow his sx. He is also encouraged to keep track of triggers to see if there is a pattern when sx occur.

## 2023-12-20 NOTE — Assessment & Plan Note (Addendum)
 Diabetes managed with Tresiba . Steroid-induced hyperglycemia resolved. Neuropathy likely exacerbated by hyperglycemia. Elevated B12 levels indicate non-B12 deficiency neuropathy. - Monitor blood glucose closely. - Adjust B12 supplementation to Monday, Wednesday, Friday. - Nephrology input is appreciated - Regarding CKD, he is encouraged to stay well hydrated, avoid NSAIDs and keep BP controlled to prevent progression of CKD.

## 2023-12-20 NOTE — Assessment & Plan Note (Signed)
 Chronic, well controlled.  He will c/w amlodipine and carvedilol. Also followed by Renal. Not currently on AceI or ARB.  He is encouraged to follow low sodium diet.

## 2023-12-22 LAB — CMP14+EGFR
ALT: 12 IU/L (ref 0–44)
AST: 14 IU/L (ref 0–40)
Albumin: 3.9 g/dL (ref 3.7–4.7)
Alkaline Phosphatase: 133 IU/L — ABNORMAL HIGH (ref 44–121)
BUN/Creatinine Ratio: 15 (ref 10–24)
BUN: 28 mg/dL — ABNORMAL HIGH (ref 8–27)
Bilirubin Total: 0.3 mg/dL (ref 0.0–1.2)
CO2: 22 mmol/L (ref 20–29)
Calcium: 9.5 mg/dL (ref 8.6–10.2)
Chloride: 106 mmol/L (ref 96–106)
Creatinine, Ser: 1.82 mg/dL — ABNORMAL HIGH (ref 0.76–1.27)
Globulin, Total: 3.1 g/dL (ref 1.5–4.5)
Glucose: 143 mg/dL — ABNORMAL HIGH (ref 70–99)
Potassium: 5 mmol/L (ref 3.5–5.2)
Sodium: 141 mmol/L (ref 134–144)
Total Protein: 7 g/dL (ref 6.0–8.5)
eGFR: 35 mL/min/{1.73_m2} — ABNORMAL LOW (ref >59)

## 2023-12-22 LAB — TSH: TSH: 2.21 u[IU]/mL (ref 0.450–4.500)

## 2023-12-22 LAB — HEMOGLOBIN A1C
Est. average glucose Bld gHb Est-mCnc: 157 mg/dL
Hgb A1c MFr Bld: 7.1 % — ABNORMAL HIGH (ref 4.8–5.6)

## 2023-12-22 LAB — PTH, INTACT AND CALCIUM: PTH: 80 pg/mL — ABNORMAL HIGH (ref 15–65)

## 2023-12-22 LAB — PROTEIN ELECTROPHORESIS, SERUM
A/G Ratio: 1 (ref 0.7–1.7)
Albumin ELP: 3.5 g/dL (ref 2.9–4.4)
Alpha 1: 0.2 g/dL (ref 0.0–0.4)
Alpha 2: 0.8 g/dL (ref 0.4–1.0)
Beta: 1.1 g/dL (ref 0.7–1.3)
Gamma Globulin: 1.5 g/dL (ref 0.4–1.8)
Globulin, Total: 3.5 g/dL (ref 2.2–3.9)

## 2023-12-22 LAB — URIC ACID: Uric Acid: 5.2 mg/dL (ref 3.8–8.4)

## 2023-12-22 LAB — PHOSPHORUS: Phosphorus: 2.9 mg/dL (ref 2.8–4.1)

## 2023-12-23 ENCOUNTER — Other Ambulatory Visit: Payer: Self-pay | Admitting: Family Medicine

## 2023-12-23 DIAGNOSIS — I129 Hypertensive chronic kidney disease with stage 1 through stage 4 chronic kidney disease, or unspecified chronic kidney disease: Secondary | ICD-10-CM

## 2023-12-25 ENCOUNTER — Encounter: Payer: Self-pay | Admitting: Internal Medicine

## 2024-01-04 ENCOUNTER — Ambulatory Visit (INDEPENDENT_AMBULATORY_CARE_PROVIDER_SITE_OTHER): Payer: Medicare Other | Admitting: Podiatry

## 2024-01-04 ENCOUNTER — Encounter: Payer: Self-pay | Admitting: Podiatry

## 2024-01-04 VITALS — Ht 70.0 in | Wt 180.8 lb

## 2024-01-04 DIAGNOSIS — M79674 Pain in right toe(s): Secondary | ICD-10-CM

## 2024-01-04 DIAGNOSIS — M79675 Pain in left toe(s): Secondary | ICD-10-CM

## 2024-01-04 DIAGNOSIS — L84 Corns and callosities: Secondary | ICD-10-CM

## 2024-01-04 DIAGNOSIS — Q828 Other specified congenital malformations of skin: Secondary | ICD-10-CM | POA: Diagnosis not present

## 2024-01-04 DIAGNOSIS — E1122 Type 2 diabetes mellitus with diabetic chronic kidney disease: Secondary | ICD-10-CM | POA: Diagnosis not present

## 2024-01-04 DIAGNOSIS — B351 Tinea unguium: Secondary | ICD-10-CM

## 2024-01-04 DIAGNOSIS — N183 Chronic kidney disease, stage 3 unspecified: Secondary | ICD-10-CM

## 2024-01-05 NOTE — Progress Notes (Signed)
Subjective:  Patient ID: Alec Solis, male    DOB: 1933-01-03,  MRN: [id number]  Kim Cincotta presents to clinic today for at risk foot care. Pt has h/o NIDDM with chronic kidney disease and corn(s)  left foot, porokeratotic lesion(s) of both feet and painful mycotic nails. Painful toenails interfere with ambulation. Aggravating factors include wearing enclosed shoe gear. Pain is relieved with periodic professional debridement. Painful corns and porokeratotic lesion(s) aggravated when weightbearing with and without shoegear. Pain is relieved with periodic professional debridement.  Chief Complaint  Patient presents with   Nail Problem    Pt is here for [facility] unsure of last A1C PCP is Dr Elnita Hai and LOV was 2 weeks ago.   New problem(s): None.   PCP is Cleave Curling, MD.  No Known Allergies  Review of Systems: Negative except as noted in the HPI.  Objective: No changes noted in today's physical examination. There were no vitals filed for this visit. Worth Pell is a pleasant 89 y.o. male WD, WN in NAD. AAO x 3.  Vascular Examination: CFT <3 seconds b/l. DP/PT pulses faintly palpable b/l. Skin temperature gradient warm to warm b/l. No pain with calf compression. No ischemia or gangrene. No cyanosis or clubbing noted b/l. Pedal hair absent.   Neurological Examination: Sensation grossly intact b/l with 10 gram monofilament. Vibratory sensation intact b/l.  Dermatological Examination: No open wounds. No interdigital macerations.  Toenails 1-5 b/l thick, discolored, elongated with subungual debris and pain on dorsal palpation.    Hyperkeratotic lesion(s) L 2nd toe.  No erythema, no edema, no drainage, no fluctuance. Porokeratotic lesion(s) R hallux and submet head 4 left foot. No erythema, no edema, no drainage, no fluctuance.  Musculoskeletal Examination: Muscle strength 5/5 to all lower extremity muscle groups bilaterally. HAV with bunion deformity noted b/l LE. Hammertoe deformity  noted 2-5 b/l.  Radiographs: None  Last A1c:      Latest Ref Rng & Units 12/20/2023   12:24 PM 09/21/2023   12:19 PM 05/19/2023    9:49 AM 02/16/2023    4:59 PM  Hemoglobin A1C  Hemoglobin-A1c 4.8 - 5.6 % 7.1  6.8  6.9  7.6    Assessment/Plan: 1. Pain due to onychomycosis of toenails of both feet   2. Corns   3. Porokeratosis   4. Diabetes mellitus with stage 3 chronic kidney disease (HCC)   Patient was evaluated and treated. All patient's and/or POA's questions/concerns addressed on today's visit. Mycotic toenails 1-5 debrided in length and girth without incident. L 2nd toe, right great toe, and submet head 4 left foot pared with sharp debridement without incident. Continue daily foot inspections and monitor blood glucose per PCP/Endocrinologist's recommendations. Continue soft, supportive shoe gear daily. Report any pedal injuries to medical professional. Call office if there are any questions/concerns. -Patient/POA to call should there be question/concern in the interim.   Return in about 3 months (around 04/05/2024).  Luella Sager, [name]      [place] LOCATION: [address], [place] [address]                   Office [phone]   [place] LOCATION: [address] [place], [place] [address] Office [phone])  [phone]  

## 2024-01-22 ENCOUNTER — Other Ambulatory Visit: Payer: Self-pay | Admitting: Internal Medicine

## 2024-01-24 ENCOUNTER — Ambulatory Visit (HOSPITAL_COMMUNITY)
Admission: RE | Admit: 2024-01-24 | Discharge: 2024-01-24 | Disposition: A | Discharge status: Home / Self Care | Source: Ambulatory Visit | Attending: Internal Medicine | Admitting: Internal Medicine

## 2024-01-24 DIAGNOSIS — F17200 Nicotine dependence, unspecified, uncomplicated: Secondary | ICD-10-CM

## 2024-01-24 LAB — VAS US LOWER EXT ART SEG MULTI (SEGMENTALS & LE RAYNAUDS)
Left ABI: 1.05
Right ABI: 1.11

## 2024-01-25 ENCOUNTER — Telehealth: Payer: Self-pay

## 2024-01-25 NOTE — Telephone Encounter (Signed)
 Called patient to inform their  PEN NEEDLES patient assistance  is ready for pick up, patient aware.

## 2024-01-29 ENCOUNTER — Ambulatory Visit: Payer: Self-pay | Admitting: Internal Medicine

## 2024-02-02 ENCOUNTER — Other Ambulatory Visit: Payer: Self-pay | Admitting: Internal Medicine

## 2024-03-14 ENCOUNTER — Other Ambulatory Visit: Payer: Self-pay | Admitting: Family Medicine

## 2024-03-14 DIAGNOSIS — M1A4710 Other secondary chronic gout, right ankle and foot, without tophus (tophi): Secondary | ICD-10-CM

## 2024-04-12 ENCOUNTER — Encounter: Payer: Self-pay | Admitting: Podiatry

## 2024-04-12 ENCOUNTER — Ambulatory Visit (INDEPENDENT_AMBULATORY_CARE_PROVIDER_SITE_OTHER): Admitting: Podiatry

## 2024-04-12 DIAGNOSIS — Q828 Other specified congenital malformations of skin: Secondary | ICD-10-CM

## 2024-04-12 DIAGNOSIS — B351 Tinea unguium: Secondary | ICD-10-CM

## 2024-04-12 DIAGNOSIS — M79674 Pain in right toe(s): Secondary | ICD-10-CM | POA: Diagnosis not present

## 2024-04-12 DIAGNOSIS — E1122 Type 2 diabetes mellitus with diabetic chronic kidney disease: Secondary | ICD-10-CM

## 2024-04-12 DIAGNOSIS — N183 Chronic kidney disease, stage 3 unspecified: Secondary | ICD-10-CM | POA: Diagnosis not present

## 2024-04-12 DIAGNOSIS — M79675 Pain in left toe(s): Secondary | ICD-10-CM | POA: Diagnosis not present

## 2024-04-17 NOTE — Progress Notes (Signed)
  Subjective:  Patient ID: Alec Solis, male    DOB: [birth year],  MRN: [id number]  Alec Solis presents to clinic today for at risk foot care. Pt has h/o NIDDM with chronic kidney disease and corn(s)  left foot, porokeratotic lesion(s) of both feet and painful mycotic nails. Painful toenails interfere with ambulation. Aggravating factors include wearing enclosed shoe gear. Pain is relieved with periodic professional debridement. Painful corns and porokeratotic lesion(s) aggravated when weightbearing with and without shoegear. Pain is relieved with periodic professional debridement.  Chief Complaint  Patient presents with   Diabetes    DFC IDDM A1C 7.1. LOV with PCP 12/20/23.   New problem(s): None.   PCP is Jarold Medici, MD.  No Known Allergies  Review of Systems: Negative except as noted in the HPI.  Objective:  There were no vitals filed for this visit. Alec Solis is a pleasant 89 y.o. male WD, WN in NAD. AAO x 3.  Vascular Examination: CFT <3 seconds b/l. DP/PT pulses faintly palpable b/l. Skin temperature gradient warm to warm b/l. No pain with calf compression. No ischemia or gangrene. No cyanosis or clubbing noted b/l. Pedal hair absent.  Trace edema b/l LE.  Neurological Examination: Sensation grossly intact b/l with 10 gram monofilament. Vibratory sensation intact b/l.  Dermatological Examination: No open wounds. No interdigital macerations.  Toenails 1-5 b/l thick, discolored, elongated with subungual debris and pain on dorsal palpation.     Porokeratotic lesion(s) distal tip left 5th toe, submet head 3 left foot. Hyperkeratotic lesion submet head 5 b/l. No erythema, no edema, no drainage, no fluctuance.  Musculoskeletal Examination: Muscle strength 5/5 to all lower extremity muscle groups bilaterally. HAV with bunion deformity noted b/l LE. Hammertoe deformity noted 2-5 b/l.  Radiographs: None  Assessment/Plan: 1. Pain due to onychomycosis of toenails of both  feet   2. Porokeratosis   3. Diabetes mellitus with stage 3 chronic kidney disease (HCC)   -Continue foot and shoe inspections daily. Monitor blood glucose per PCP/Endocrinologist's recommendations. -Patient to continue soft, supportive shoe gear daily. -Toenails 1-5 b/l were debrided in length and girth with sterile nail nippers and dremel without iatrogenic bleeding.  -Porokeratotic lesion(s) distal tip left 5th toe, submet head 3 left foot, and submet head 5 b/l pared and enucleated with sterile currette without incident. Total number of lesions debrided=4. -Patient/POA to call should there be question/concern in the interim.   Return in about 3 months (around 07/13/2024).  Delon LITTIE Merlin, [name]      [place] LOCATION: [address], [place] [address]                   Office [phone]   [facility] LOCATION: [address] [place], [place] [address] Office [phone]

## 2024-04-20 ENCOUNTER — Ambulatory Visit (INDEPENDENT_AMBULATORY_CARE_PROVIDER_SITE_OTHER): Admitting: Internal Medicine

## 2024-04-20 ENCOUNTER — Encounter: Payer: Self-pay | Admitting: Internal Medicine

## 2024-04-20 VITALS — BP 110/80 | HR 75 | Temp 98.1°F | Ht 70.0 in | Wt 180.0 lb

## 2024-04-20 DIAGNOSIS — I1310 Hypertensive heart and chronic kidney disease without heart failure, with stage 1 through stage 4 chronic kidney disease, or unspecified chronic kidney disease: Secondary | ICD-10-CM

## 2024-04-20 DIAGNOSIS — E1122 Type 2 diabetes mellitus with diabetic chronic kidney disease: Secondary | ICD-10-CM | POA: Diagnosis not present

## 2024-04-20 DIAGNOSIS — E042 Nontoxic multinodular goiter: Secondary | ICD-10-CM

## 2024-04-20 DIAGNOSIS — I2510 Atherosclerotic heart disease of native coronary artery without angina pectoris: Secondary | ICD-10-CM

## 2024-04-20 DIAGNOSIS — N1832 Chronic kidney disease, stage 3b: Secondary | ICD-10-CM | POA: Diagnosis not present

## 2024-04-20 DIAGNOSIS — E782 Mixed hyperlipidemia: Secondary | ICD-10-CM

## 2024-04-20 DIAGNOSIS — K219 Gastro-esophageal reflux disease without esophagitis: Secondary | ICD-10-CM

## 2024-04-20 DIAGNOSIS — Z794 Long term (current) use of insulin: Secondary | ICD-10-CM

## 2024-04-20 NOTE — Assessment & Plan Note (Signed)
 Managed with pantoprazole . Discussed potential long-term renal effects. Advised to consider alternate-day dosing if symptoms controlled. - Advise taking pantoprazole  every other day if symptoms allow.

## 2024-04-20 NOTE — Assessment & Plan Note (Signed)
 Enlarged thyroid  with nodules causing occasional swallowing difficulty. - Order thyroid  ultrasound to evaluate nodules. - Consider swallowing test if ultrasound indicates esophageal compression.

## 2024-04-20 NOTE — Progress Notes (Addendum)
 I,[name], CMA,acting as a Neurosurgeon for Alec LOISE Slocumb, MD.,have documented all relevant documentation on the behalf of Alec LOISE Slocumb, MD,as directed by  Alec LOISE Slocumb, MD while in the presence of Alec LOISE Slocumb, MD.  Subjective:  Patient ID: Alec Solis , male    DOB: [birth year] , 89 y.o.   MRN: [id number]  Chief Complaint  Patient presents with   Diabetes    Patient presents today for dm, bp & cholesterol follow up. She reports compliance with medications. Denies headache, chest pain & sob.  He would like to further discuss Amlodipine . He has heard  controversy about medication.    Hypertension   Hyperlipidemia    HPI Discussed the use of AI scribe software for clinical note transcription with the patient, who gave verbal consent to proceed.  History of Present Illness [name] is an 89 year old male with diabetes and hypertension who presents for a follow-up visit.  His blood sugar levels have been generally low, typically in the 80s and 90s, with one instance of 76. He feels okay at these levels. He used his glucagon  pen once when his blood sugar dropped to 68, experiencing tingling in his fingers at that time. He might not have eaten properly during that episode. He currently takes Tresiba  18 units at night.  He is taking allopurinol  100 mg daily for gout, amlodipine  5 mg daily, atorvastatin  20 mg daily, carvedilol  12.5 mg twice a day, and Lasix as needed. He has concerns about amlodipine , particularly regarding leg swelling, which occurs more in the summer. He also takes pantoprazole  for reflux, which is working well.  He has a history of emphysema due to smoking and has had lung nodules.   [place], near [employer]. He has a friend with dementia and reflects on the impact of short-term memory loss.   Diabetes He presents for his follow-up diabetic visit. He has type 2 diabetes mellitus. His disease course has been improving.  There are no hypoglycemic associated symptoms. Pertinent negatives for hypoglycemia include no dizziness or headaches. Pertinent negatives for diabetes include no blurred vision, no chest pain, no fatigue, no polydipsia, no polyphagia and no polyuria. Symptoms are improving. There are no diabetic complications. Risk factors for coronary artery disease include sedentary lifestyle, male sex, diabetes mellitus and hypertension. Current diabetic treatment includes insulin  injections. He is compliant with treatment most of the time. Meal planning includes avoidance of concentrated sweets. He participates in exercise intermittently. His home blood glucose trend is decreasing steadily. (Blood sugars are running lower down to 86, he is currently taking 20 units nightly) Eye exam is current.  Hypertension This is a chronic problem. The current episode started more than 1 year ago. The problem has been gradually improving since onset. The problem is controlled. Pertinent negatives include no blurred vision, chest pain, headaches, palpitations or shortness of breath. Risk factors for coronary artery disease include diabetes mellitus, dyslipidemia, sedentary lifestyle and male gender. Identifiable causes of hypertension include chronic renal disease.  Hyperlipidemia This is a chronic problem. Exacerbating diseases include chronic renal disease and diabetes. Pertinent negatives include no chest pain or shortness of breath. Current antihyperlipidemic treatment includes statins. Compliance problems include adherence to exercise.  Risk factors for coronary artery disease include diabetes mellitus, dyslipidemia, male sex, hypertension and a sedentary lifestyle.     Past Medical History:  Diagnosis Date   Anemia    Anxiety    Arthritis  Cataract    Chronic kidney disease    Depression    Diabetes mellitus without complication (HCC)    type II    GERD (gastroesophageal reflux disease)    Gout    Heart murmur     Hypertension    Myocardial infarction (HCC)    minor Mi- years ago -    Varicose veins of left lower extremity      Family History  Problem Relation Age of Onset   Breast cancer Mother    Stomach cancer Mother    Diabetes Mellitus II Maternal Aunt    Lung cancer Neg Hx    Lung disease Neg Hx      Current Outpatient Medications:    acetaminophen  (TYLENOL ) 650 MG CR tablet, Take 650 mg by mouth every 8 (eight) hours as needed for pain. , Disp: , Rfl:    allopurinol  (ZYLOPRIM ) 100 MG tablet, TAKE 1 TABLET(100 MG) BY MOUTH TWICE DAILY, Disp: 180 tablet, Rfl: 1   amLODipine  (NORVASC ) 5 MG tablet, TAKE 1 TABLET(5 MG) BY MOUTH DAILY, Disp: 90 tablet, Rfl: 1   aspirin  81 MG tablet, Take 81 mg by mouth daily. , Disp: , Rfl:    atorvastatin  (LIPITOR) 20 MG tablet, Take 1 tablet (20 mg total) by mouth daily., Disp: 30 tablet, Rfl: 11   B-D ULTRAFINE III SHORT PEN 31G X 8 MM MISC, USE AS DIRECTED FOUR TIMES DAILY, Disp: 100 each, Rfl: 3   benzonatate  (TESSALON ) 100 MG capsule, Take 1 capsule (100 mg total) by mouth every 8 (eight) hours., Disp: 21 capsule, Rfl: 0   calcitRIOL  (ROCALTROL ) 0.25 MCG capsule, Take 0.25 mcg by mouth every other day. , Disp: , Rfl:    carboxymethylcellulose (REFRESH PLUS) 0.5 % SOLN, Place 1 drop into both eyes 3 (three) times daily as needed (Watery eyes). , Disp: , Rfl:    carvedilol  (COREG ) 12.5 MG tablet, Take 12.5 mg by mouth 2 (two) times daily with a meal., Disp: , Rfl:    ferrous sulfate 325 (65 FE) MG tablet, Take 325 mg by mouth daily with breakfast., Disp: , Rfl:    fluticasone  (FLONASE ) 50 MCG/ACT nasal spray, Place 2 sprays into both nostrils daily., Disp: 16 g, Rfl: 2   furosemide (LASIX) 40 MG tablet, Take 40 mg by mouth daily as needed for fluid or edema. 2 Tablets by mouth daily as needed, Disp: , Rfl:    Glucagon  (GVOKE HYPOPEN  2-PACK) 0.5 MG/0.1ML SOAJ, Inject 0.1 mLs into the skin as needed (inject SQ under the skin for BS less than 70)., Disp: 0.2  mL, Rfl: 1   glucose blood (TRUE METRIX BLOOD GLUCOSE TEST) test strip, Use as directed to check blood sugars 3 times per day dx: e11.65, Disp: 300 each, Rfl: 5   insulin  degludec (TRESIBA  FLEXTOUCH) 200 UNIT/ML FlexTouch Pen, Inject 18 Units into the skin daily., Disp: 27 mL, Rfl: 1   Insulin  Pen Needle 32G X 8 MM MISC, Use as directed, Disp: 100 each, Rfl: 2   Omega-3 Fatty Acids (FISH OIL OMEGA-3 PO), Take 1,200 mg by mouth daily. , Disp: , Rfl:    ondansetron  (ZOFRAN ) 4 MG tablet, TAKE 1 TABLET(4 MG) BY MOUTH DAILY AS NEEDED FOR NAUSEA OR VOMITING, Disp: 30 tablet, Rfl: 0   pantoprazole  (PROTONIX ) 40 MG tablet, TAKE 1 TABLET(40 MG) BY MOUTH DAILY, Disp: 90 tablet, Rfl: 0   sodium bicarbonate  650 MG tablet, Take 1,300 mg by mouth 2 (two) times daily., Disp: , Rfl:  TRUEplus Lancets 30G MISC, USE AS DIRECTED TO TEST BLOOD SUGAR THREE TIMES DAILY, Disp: 300 each, Rfl: 11   vitamin B-12 (CYANOCOBALAMIN ) 1000 MCG tablet, Take 1,000 mcg by mouth daily., Disp: , Rfl:    No Known Allergies   Review of Systems  Constitutional: Negative.  Negative for fatigue.  Eyes:  Negative for blurred vision.  Respiratory: Negative.  Negative for shortness of breath.   Cardiovascular: Negative.  Negative for chest pain and palpitations.  Endocrine: Negative.  Negative for polydipsia, polyphagia and polyuria.  Skin: Negative.   Allergic/Immunologic: Negative.   Neurological:  Negative for dizziness and headaches.  Hematological: Negative.      Today's Vitals   04/20/24 1151  BP: 110/80  Pulse: 75  Temp: 98.1 F (36.7 C)  SpO2: 98%  Weight: 180 lb (81.6 kg)  Height: 5' 10 (1.778 m)   Body mass index is 25.83 kg/m.  Wt Readings from Last 3 Encounters:  04/20/24 180 lb (81.6 kg)  01/04/24 180 lb 12.8 oz (82 kg)  12/20/23 180 lb 12.8 oz (82 kg)     Objective:  Physical Exam Vitals and nursing note reviewed.  Constitutional:      Appearance: Normal appearance.  HENT:     Head:  Normocephalic and atraumatic.  Eyes:     Extraocular Movements: Extraocular movements intact.  Cardiovascular:     Rate and Rhythm: Normal rate and regular rhythm.     Heart sounds: Normal heart sounds.  Pulmonary:     Effort: Pulmonary effort is normal.     Breath sounds: Normal breath sounds.  Musculoskeletal:     Cervical back: Normal range of motion.  Skin:    General: Skin is warm.  Neurological:     General: No focal deficit present.     Mental Status: He is alert.  Psychiatric:        Mood and Affect: Mood normal.         Assessment And Plan:  Type 2 diabetes mellitus with stage 3b chronic kidney disease, with long-term current use of insulin  (HCC) Assessment & Plan: Blood glucose levels stable with occasional hypoglycemia. Current insulin  regimen is Tresiba  18 units at night. - Review A1c results for potential insulin  dosage adjustment. - Consider reducing Tresiba  to 14 units based on A1c results. - Schedule follow-up appointment around Thanksgiving if blood glucose levels remain low.  Orders: -     CMP14+EGFR -     Lipid panel -     Hemoglobin A1c  Hypertensive heart and renal disease with renal failure, stage 1 through stage 4 or unspecified chronic kidney disease, without heart failure Assessment & Plan: Chronic, controlled. Blood pressure managed with amlodipine  5 mg daily. Concerns about potential side effects like leg swelling, especially in summer. Kidney function stable per nephrologist.   Coronary artery calcification seen on CAT scan Assessment & Plan: Chronic, LDL goal is less than 70.  She will continue with ASA 81mg  daily and atorvastatin  20mg  daily.  - Follow heart healthy lifestyle - Maintain an active lifestyle   Mixed hyperlipidemia Assessment & Plan: Chronic, LDL goal is less than 70. He is now on atorvastatin . Will check fasting lipid panel at his next visit. I will adjust dose as needed.    Gastroesophageal reflux disease without  esophagitis Assessment & Plan: Managed with pantoprazole . Discussed potential long-term renal effects. Advised to consider alternate-day dosing if symptoms controlled. - Advise taking pantoprazole  every other day if symptoms allow.   Multinodular goiter Assessment &  Plan: Enlarged thyroid  with nodules causing occasional swallowing difficulty. - Order thyroid  ultrasound to evaluate nodules. - Consider swallowing test if ultrasound indicates esophageal compression.  Orders: -     US  THYROID ; Future   Return in 4 weeks (on 05/18/2024), or flu shot NV, for second of week of January - dm check.  Patient was given opportunity to ask questions. Patient verbalized understanding of the plan and was able to repeat key elements of the plan. All questions were answered to their satisfaction.   I, Alec LOISE Slocumb, MD, have reviewed all documentation for this visit. The documentation on 04/20/24 for the exam, diagnosis, procedures, and orders are all accurate and complete.   IF YOU HAVE BEEN REFERRED TO A SPECIALIST, IT MAY TAKE 1-2 WEEKS TO SCHEDULE/PROCESS THE REFERRAL. IF YOU HAVE NOT HEARD FROM US /SPECIALIST IN TWO WEEKS, PLEASE GIVE US  A CALL AT [phone].   THE PATIENT IS ENCOURAGED TO PRACTICE SOCIAL DISTANCING DUE TO THE COVID-19 PANDEMIC.

## 2024-04-20 NOTE — Assessment & Plan Note (Signed)
 Chronic, LDL goal is less than 70. He is now on atorvastatin . Will check fasting lipid panel at his next visit. I will adjust dose as needed.

## 2024-04-20 NOTE — Assessment & Plan Note (Signed)
 Chronic, LDL goal is less than 70.  She will continue with ASA 81mg  daily and atorvastatin  20mg  daily.  - Follow heart healthy lifestyle - Maintain an active lifestyle

## 2024-04-20 NOTE — Progress Notes (Unsigned)
 I,[name], CMA,acting as a Neurosurgeon for Alec LOISE Slocumb, MD.,have documented all relevant documentation on the behalf of Alec LOISE Slocumb, MD,as directed by  Alec LOISE Slocumb, MD while in the presence of Alec LOISE Slocumb, MD.  Subjective:  Patient ID: Alec Solis , male    DOB: 08/16/1933 , 89 y.o.   MRN: [id number]  Chief Complaint  Patient presents with   Diabetes    Patient presents today for dm, bp & cholesterol follow up. She reports compliance with medications. Denies headache, chest pain & sob.  He would like to further discuss Amlodipine . He has heard  controversy about medication.    Hypertension   Hyperlipidemia    HPI  Patient presents today for a diabetes and BP check. Patient reports compliance with his meds.Denies headache, chest pain & SOB. Patient does not have any other questions or concerns at this time.   Diabetes He presents for his follow-up diabetic visit. He has type 2 diabetes mellitus. His disease course has been improving. There are no hypoglycemic associated symptoms. Pertinent negatives for hypoglycemia include no dizziness or headaches. Pertinent negatives for diabetes include no blurred vision, no chest pain, no fatigue, no polydipsia, no polyphagia and no polyuria. There are no hypoglycemic complications. Symptoms are improving. There are no diabetic complications. Risk factors for coronary artery disease include sedentary lifestyle, male sex, diabetes mellitus and hypertension. Current diabetic treatment includes insulin  injections. He is compliant with treatment most of the time. Meal planning includes avoidance of concentrated sweets. He participates in exercise intermittently. His home blood glucose trend is decreasing steadily. (Blood sugars are running lower down to 86, he is currently taking 20 units nightly) Eye exam is current.  Hypertension This is a chronic problem. The current episode started more than 1 year ago. The problem has been gradually  improving since onset. The problem is controlled. Pertinent negatives include no blurred vision, chest pain, headaches, palpitations or shortness of breath. Risk factors for coronary artery disease include diabetes mellitus, dyslipidemia, sedentary lifestyle and male gender. Identifiable causes of hypertension include chronic renal disease.  Hyperlipidemia This is a chronic problem. Exacerbating diseases include chronic renal disease and diabetes. Pertinent negatives include no chest pain or shortness of breath. Current antihyperlipidemic treatment includes statins. Compliance problems include adherence to exercise.  Risk factors for coronary artery disease include diabetes mellitus, dyslipidemia, male sex, hypertension and a sedentary lifestyle.     Past Medical History:  Diagnosis Date   Anemia    Anxiety    Arthritis    Cataract    Chronic kidney disease    Depression    Diabetes mellitus without complication (HCC)    type II    GERD (gastroesophageal reflux disease)    Gout    Heart murmur    Hypertension    Myocardial infarction (HCC)    minor Mi- years ago -    Varicose veins of left lower extremity      Family History  Problem Relation Age of Onset   Breast cancer Mother    Stomach cancer Mother    Diabetes Mellitus II Maternal Aunt    Lung cancer Neg Hx    Lung disease Neg Hx      Current Outpatient Medications:    acetaminophen  (TYLENOL ) 650 MG CR tablet, Take 650 mg by mouth every 8 (eight) hours as needed for pain. , Disp: , Rfl:    allopurinol  (ZYLOPRIM ) 100 MG tablet, TAKE 1 TABLET(100 MG) BY MOUTH TWICE DAILY,  Disp: 180 tablet, Rfl: 1   amLODipine  (NORVASC ) 5 MG tablet, TAKE 1 TABLET(5 MG) BY MOUTH DAILY, Disp: 90 tablet, Rfl: 1   aspirin  81 MG tablet, Take 81 mg by mouth daily. , Disp: , Rfl:    atorvastatin  (LIPITOR) 20 MG tablet, Take 1 tablet (20 mg total) by mouth daily., Disp: 30 tablet, Rfl: 11   B-D ULTRAFINE III SHORT PEN 31G X 8 MM MISC, USE AS DIRECTED  FOUR TIMES DAILY, Disp: 100 each, Rfl: 3   benzonatate  (TESSALON ) 100 MG capsule, Take 1 capsule (100 mg total) by mouth every 8 (eight) hours., Disp: 21 capsule, Rfl: 0   calcitRIOL  (ROCALTROL ) 0.25 MCG capsule, Take 0.25 mcg by mouth every other day. , Disp: , Rfl:    carboxymethylcellulose (REFRESH PLUS) 0.5 % SOLN, Place 1 drop into both eyes 3 (three) times daily as needed (Watery eyes). , Disp: , Rfl:    carvedilol  (COREG ) 12.5 MG tablet, Take 12.5 mg by mouth 2 (two) times daily with a meal., Disp: , Rfl:    ferrous sulfate 325 (65 FE) MG tablet, Take 325 mg by mouth daily with breakfast., Disp: , Rfl:    fluticasone  (FLONASE ) 50 MCG/ACT nasal spray, Place 2 sprays into both nostrils daily., Disp: 16 g, Rfl: 2   furosemide (LASIX) 40 MG tablet, Take 40 mg by mouth daily as needed for fluid or edema. 2 Tablets by mouth daily as needed, Disp: , Rfl:    Glucagon  (GVOKE HYPOPEN  2-PACK) 0.5 MG/0.1ML SOAJ, Inject 0.1 mLs into the skin as needed (inject SQ under the skin for BS less than 70)., Disp: 0.2 mL, Rfl: 1   glucose blood (TRUE METRIX BLOOD GLUCOSE TEST) test strip, Use as directed to check blood sugars 3 times per day dx: e11.65, Disp: 300 each, Rfl: 5   insulin  degludec (TRESIBA  FLEXTOUCH) 200 UNIT/ML FlexTouch Pen, Inject 18 Units into the skin daily., Disp: 27 mL, Rfl: 1   Insulin  Pen Needle 32G X 8 MM MISC, Use as directed, Disp: 100 each, Rfl: 2   Omega-3 Fatty Acids (FISH OIL OMEGA-3 PO), Take 1,200 mg by mouth daily. , Disp: , Rfl:    ondansetron  (ZOFRAN ) 4 MG tablet, TAKE 1 TABLET(4 MG) BY MOUTH DAILY AS NEEDED FOR NAUSEA OR VOMITING, Disp: 30 tablet, Rfl: 0   pantoprazole  (PROTONIX ) 40 MG tablet, TAKE 1 TABLET(40 MG) BY MOUTH DAILY, Disp: 90 tablet, Rfl: 0   sodium bicarbonate  650 MG tablet, Take 1,300 mg by mouth 2 (two) times daily., Disp: , Rfl:    TRUEplus Lancets 30G MISC, USE AS DIRECTED TO TEST BLOOD SUGAR THREE TIMES DAILY, Disp: 300 each, Rfl: 11   vitamin B-12  (CYANOCOBALAMIN ) 1000 MCG tablet, Take 1,000 mcg by mouth daily., Disp: , Rfl:    No Known Allergies   Review of Systems  Constitutional: Negative.  Negative for fatigue.  Eyes:  Negative for blurred vision.  Respiratory: Negative.  Negative for shortness of breath.   Cardiovascular: Negative.  Negative for chest pain and palpitations.  Endocrine: Negative.  Negative for polydipsia, polyphagia and polyuria.  Skin: Negative.   Allergic/Immunologic: Negative.   Neurological:  Negative for dizziness and headaches.  Hematological: Negative.      Today's Vitals   04/20/24 1151  BP: 110/80  Pulse: 75  Temp: 98.1 F (36.7 C)  SpO2: 98%  Weight: 180 lb (81.6 kg)  Height: 5' 10 (1.778 m)   Body mass index is 25.83 kg/m.  Wt Readings from Last  3 Encounters:  04/20/24 180 lb (81.6 kg)  01/04/24 180 lb 12.8 oz (82 kg)  12/20/23 180 lb 12.8 oz (82 kg)     Objective:  Physical Exam      Assessment And Plan:  Type 2 diabetes mellitus with stage 3b chronic kidney disease, with long-term current use of insulin  (HCC)  Hypertensive nephropathy  Mixed hyperlipidemia  Anemia in stage 3b chronic kidney disease (HCC)     Return for 3 month dm f/u.SABRA  Patient was given opportunity to ask questions. Patient verbalized understanding of the plan and was able to repeat key elements of the plan. All questions were answered to their satisfaction.  Alec LOISE Slocumb, MD  I, Alec LOISE Slocumb, MD, have reviewed all documentation for this visit. The documentation on 04/20/24 for the exam, diagnosis, procedures, and orders are all accurate and complete.   IF YOU HAVE BEEN REFERRED TO A SPECIALIST, IT MAY TAKE 1-2 WEEKS TO SCHEDULE/PROCESS THE REFERRAL. IF YOU HAVE NOT HEARD FROM US /SPECIALIST IN TWO WEEKS, PLEASE GIVE US  A CALL AT [phone] X [phone].   THE PATIENT IS ENCOURAGED TO PRACTICE SOCIAL DISTANCING DUE TO THE COVID-19 PANDEMIC.

## 2024-04-20 NOTE — Patient Instructions (Signed)

## 2024-04-20 NOTE — Assessment & Plan Note (Signed)
 Chronic, controlled. Blood pressure managed with amlodipine  5 mg daily. Concerns about potential side effects like leg swelling, especially in summer. Kidney function stable per nephrologist.

## 2024-04-20 NOTE — Assessment & Plan Note (Signed)
 Blood glucose levels stable with occasional hypoglycemia. Current insulin  regimen is Tresiba  18 units at night. - Review A1c results for potential insulin  dosage adjustment. - Consider reducing Tresiba  to 14 units based on A1c results. - Schedule follow-up appointment around Thanksgiving if blood glucose levels remain low.

## 2024-04-21 LAB — CMP14+EGFR
ALT: 10 IU/L (ref 0–44)
AST: 14 IU/L (ref 0–40)
Albumin: 4.2 g/dL (ref 3.7–4.7)
Alkaline Phosphatase: 151 IU/L — ABNORMAL HIGH (ref 44–121)
BUN/Creatinine Ratio: 15 (ref 10–24)
BUN: 27 mg/dL (ref 8–27)
Bilirubin Total: 0.3 mg/dL (ref 0.0–1.2)
CO2: 22 mmol/L (ref 20–29)
Calcium: 9.6 mg/dL (ref 8.6–10.2)
Chloride: 103 mmol/L (ref 96–106)
Creatinine, Ser: 1.86 mg/dL — ABNORMAL HIGH (ref 0.76–1.27)
Globulin, Total: 3.2 g/dL (ref 1.5–4.5)
Glucose: 147 mg/dL — ABNORMAL HIGH (ref 70–99)
Potassium: 4.4 mmol/L (ref 3.5–5.2)
Sodium: 141 mmol/L (ref 134–144)
Total Protein: 7.4 g/dL (ref 6.0–8.5)
eGFR: 34 mL/min/1.73 — ABNORMAL LOW (ref >59)

## 2024-04-21 LAB — HEMOGLOBIN A1C
Est. average glucose Bld gHb Est-mCnc: 146 mg/dL
Hgb A1c MFr Bld: 6.7 % — ABNORMAL HIGH (ref 4.8–5.6)

## 2024-04-21 LAB — LIPID PANEL
Chol/HDL Ratio: 5.4 ratio — ABNORMAL HIGH (ref 0.0–5.0)
Cholesterol, Total: 145 mg/dL (ref 100–199)
HDL: 27 mg/dL — ABNORMAL LOW (ref >39)
LDL Chol Calc (NIH): 81 mg/dL (ref 0–99)
Triglycerides: 221 mg/dL — ABNORMAL HIGH (ref 0–149)
VLDL Cholesterol Cal: 37 mg/dL (ref 5–40)

## 2024-04-23 ENCOUNTER — Ambulatory Visit: Payer: Self-pay | Admitting: Internal Medicine

## 2024-04-26 ENCOUNTER — Ambulatory Visit
Admission: RE | Admit: 2024-04-26 | Discharge: 2024-04-26 | Disposition: A | Discharge status: Home / Self Care | Source: Ambulatory Visit | Attending: Internal Medicine | Admitting: Internal Medicine

## 2024-04-26 DIAGNOSIS — E042 Nontoxic multinodular goiter: Secondary | ICD-10-CM | POA: Diagnosis not present

## 2024-05-03 DIAGNOSIS — D631 Anemia in chronic kidney disease: Secondary | ICD-10-CM | POA: Diagnosis not present

## 2024-05-03 DIAGNOSIS — N2581 Secondary hyperparathyroidism of renal origin: Secondary | ICD-10-CM | POA: Diagnosis not present

## 2024-05-03 DIAGNOSIS — M109 Gout, unspecified: Secondary | ICD-10-CM | POA: Diagnosis not present

## 2024-05-03 DIAGNOSIS — I129 Hypertensive chronic kidney disease with stage 1 through stage 4 chronic kidney disease, or unspecified chronic kidney disease: Secondary | ICD-10-CM | POA: Diagnosis not present

## 2024-05-03 DIAGNOSIS — E1122 Type 2 diabetes mellitus with diabetic chronic kidney disease: Secondary | ICD-10-CM | POA: Diagnosis not present

## 2024-05-03 DIAGNOSIS — N183 Chronic kidney disease, stage 3 unspecified: Secondary | ICD-10-CM | POA: Diagnosis not present

## 2024-05-05 ENCOUNTER — Other Ambulatory Visit: Payer: Self-pay | Admitting: Internal Medicine

## 2024-05-16 ENCOUNTER — Ambulatory Visit (INDEPENDENT_AMBULATORY_CARE_PROVIDER_SITE_OTHER)

## 2024-05-16 VITALS — BP 126/80 | HR 85 | Temp 98.1°F | Ht 70.0 in | Wt 180.4 lb

## 2024-05-16 DIAGNOSIS — Z23 Encounter for immunization: Secondary | ICD-10-CM | POA: Diagnosis not present

## 2024-05-16 NOTE — Patient Instructions (Signed)

## 2024-05-16 NOTE — Progress Notes (Signed)
 Patient is in office today for a nurse visit for FLU Immunization. Patient Injection was given in the  Left deltoid. Patient tolerated injection well.

## 2024-05-24 ENCOUNTER — Ambulatory Visit (INDEPENDENT_AMBULATORY_CARE_PROVIDER_SITE_OTHER)

## 2024-05-24 VITALS — BP 118/62 | HR 82 | Temp 98.7°F | Ht 70.0 in | Wt 176.4 lb

## 2024-05-24 DIAGNOSIS — Z0000 Encounter for general adult medical examination without abnormal findings: Secondary | ICD-10-CM | POA: Diagnosis not present

## 2024-05-24 NOTE — Progress Notes (Signed)
 Subjective:   Alec Solis is a 89 y.o. who presents for a Medicare Wellness preventive visit.  As a reminder, Annual Wellness Visits don't include a physical exam, and some assessments may be limited, especially if this visit is performed virtually. We may recommend an in-person follow-up visit with your provider if needed.  Visit Complete: In person    Persons Participating in Visit: Patient.  AWV Questionnaire: No: Patient Medicare AWV questionnaire was not completed prior to this visit.  Cardiac Risk Factors include: advanced age (>57men, >98 women);diabetes mellitus;male gender     Objective:    Today's Vitals   05/24/24 1227  BP: 118/62  Pulse: 82  Temp: 98.7 F (37.1 C)  TempSrc: Oral  SpO2: 95%  Weight: 176 lb 6.4 oz (80 kg)  Height: 5' 10 (1.778 m)   Body mass index is 25.31 kg/m.     05/24/2024   12:35 PM 04/20/2023   10:02 AM 03/11/2022    2:40 PM 01/30/2021    9:04 AM 03/29/2020    8:38 AM 05/23/2019    3:51 PM 05/28/2017    8:13 AM  Advanced Directives  Does Patient Have a Medical Advance Directive? No No Yes No No No No   Type of Surveyor, minerals;Living will      Copy of Healthcare Power of Attorney in Chart?   No - copy requested      Would patient like information on creating a medical advance directive?     No - Patient declined  No - Patient declined      Data saved with a previous flowsheet row definition    Current Medications (verified) Outpatient Encounter Medications as of 05/24/2024  Medication Sig   acetaminophen  (TYLENOL ) 650 MG CR tablet Take 650 mg by mouth every 8 (eight) hours as needed for pain.    allopurinol  (ZYLOPRIM ) 100 MG tablet TAKE 1 TABLET(100 MG) BY MOUTH TWICE DAILY   amLODipine  (NORVASC ) 5 MG tablet TAKE 1 TABLET(5 MG) BY MOUTH DAILY   aspirin  81 MG tablet Take 81 mg by mouth daily.    atorvastatin  (LIPITOR) 20 MG tablet Take 1 tablet (20 mg total) by mouth daily.   B-D ULTRAFINE III SHORT  PEN 31G X 8 MM MISC USE AS DIRECTED FOUR TIMES DAILY   benzonatate  (TESSALON ) 100 MG capsule Take 1 capsule (100 mg total) by mouth every 8 (eight) hours.   calcitRIOL  (ROCALTROL ) 0.25 MCG capsule Take 0.25 mcg by mouth every other day.    carboxymethylcellulose (REFRESH PLUS) 0.5 % SOLN Place 1 drop into both eyes 3 (three) times daily as needed (Watery eyes).    carvedilol  (COREG ) 12.5 MG tablet Take 12.5 mg by mouth 2 (two) times daily with a meal.   ferrous sulfate 325 (65 FE) MG tablet Take 325 mg by mouth daily with breakfast.   fluticasone  (FLONASE ) 50 MCG/ACT nasal spray Place 2 sprays into both nostrils daily.   furosemide (LASIX) 40 MG tablet Take 40 mg by mouth daily as needed for fluid or edema. 2 Tablets by mouth daily as needed   Glucagon  (GVOKE HYPOPEN  2-PACK) 0.5 MG/0.1ML SOAJ Inject 0.1 mLs into the skin as needed (inject SQ under the skin for BS less than 70).   glucose blood (TRUE METRIX BLOOD GLUCOSE TEST) test strip Use as directed to check blood sugars 3 times per day dx: e11.65   insulin  degludec (TRESIBA  FLEXTOUCH) 200 UNIT/ML FlexTouch Pen Inject 18 Units into the skin  daily.   Insulin  Pen Needle 32G X 8 MM MISC Use as directed   Omega-3 Fatty Acids (FISH OIL OMEGA-3 PO) Take 1,200 mg by mouth daily.    ondansetron  (ZOFRAN ) 4 MG tablet TAKE 1 TABLET(4 MG) BY MOUTH DAILY AS NEEDED FOR NAUSEA OR VOMITING   pantoprazole  (PROTONIX ) 40 MG tablet TAKE 1 TABLET(40 MG) BY MOUTH DAILY   sodium bicarbonate  650 MG tablet Take 1,300 mg by mouth 2 (two) times daily.   TRUEplus Lancets 30G MISC USE AS DIRECTED TO TEST BLOOD SUGAR THREE TIMES DAILY   vitamin B-12 (CYANOCOBALAMIN ) 1000 MCG tablet Take 1,000 mcg by mouth daily.   No facility-administered encounter medications on file as of 05/24/2024.    Allergies (verified) Patient has no known allergies.   History: Past Medical History:  Diagnosis Date   Anemia    Anxiety    Arthritis    Cataract    Chronic kidney disease     Depression    Diabetes mellitus without complication (HCC)    type II    GERD (gastroesophageal reflux disease)    Gout    Heart murmur    Hypertension    Myocardial infarction (HCC)    minor Mi- years ago -    Varicose veins of left lower extremity    Past Surgical History:  Procedure Laterality Date   BUNIONECTOMY     cataract     COLONOSCOPY WITH PROPOFOL  N/A 05/28/2017   Procedure: COLONOSCOPY WITH PROPOFOL ;  Surgeon: Rollin Dover, MD;  Location: [facility];  Service: Endoscopy;  Laterality: N/A;   COLONOSCOPY WITH PROPOFOL  N/A 03/29/2020   Procedure: COLONOSCOPY WITH PROPOFOL ;  Surgeon: Rollin Dover, MD;  Location: [facility];  Service: Endoscopy;  Laterality: N/A;   ENTEROSCOPY N/A 03/29/2020   Procedure: ENTEROSCOPY;  Surgeon: Rollin Dover, MD;  Location: [facility];  Service: Endoscopy;  Laterality: N/A;   ESOPHAGOGASTRODUODENOSCOPY (EGD) WITH PROPOFOL  N/A 05/28/2017   Procedure: ESOPHAGOGASTRODUODENOSCOPY (EGD) WITH PROPOFOL ;  Surgeon: Rollin Dover, MD;  Location: [facility];  Service: Endoscopy;  Laterality: N/A;   HEMOSTASIS CLIP PLACEMENT  03/29/2020   Procedure: HEMOSTASIS CLIP PLACEMENT;  Surgeon: Rollin Dover, MD;  Location: [facility];  Service: Endoscopy;;   HOT HEMOSTASIS N/A 03/29/2020   Procedure: HOT HEMOSTASIS (ARGON PLASMA COAGULATION/BICAP);  Surgeon: Rollin Dover, MD;  Location: [facility];  Service: Endoscopy;  Laterality: N/A;   POLYPECTOMY  03/29/2020   Procedure: POLYPECTOMY;  Surgeon: Rollin Dover, MD;  Location: [facility];  Service: Endoscopy;;   Family History  Problem Relation Age of Onset   Breast cancer Mother    Stomach cancer Mother    Diabetes Mellitus II Maternal Aunt    Lung cancer Neg Hx    Lung disease Neg Hx    Social History   Socioeconomic History   Marital status: Married    Spouse name: Not on file   Number of children: 1   Years of education: Not on file   Highest education level: Not on file  Occupational History    Occupation: retired  Tobacco Use   Smoking status: Some Days    Current packs/day: 0.00    Average packs/day: 0.5 packs/day for 62.0 years (31.0 ttl pk-yrs)    Types: Cigarettes    Start date: 08/24/1952    Last attempt to quit: 08/24/2014    Years since quitting: 9.7   Smokeless tobacco: Never  Vaping Use   Vaping status: Never Used  Substance and Sexual Activity   Alcohol  use: Yes  Comment: occassional wine beer   Drug use: No   Sexual activity: Not Currently  Other Topics Concern   Not on file  Social History Narrative   Not on file   Social Drivers of Health   Financial Resource Strain: Low Risk  (05/24/2024)   Overall Financial Resource Strain (CARDIA)    Difficulty of Paying Living Expenses: Not hard at all  Food Insecurity: No Food Insecurity (05/24/2024)   Hunger Vital Sign    Worried About Running Out of Food in the Last Year: Never true    Ran Out of Food in the Last Year: Never true  Transportation Needs: No Transportation Needs (05/24/2024)   PRAPARE - Administrator, Civil Service (Medical): No    Lack of Transportation (Non-Medical): No  Physical Activity: Inactive (05/24/2024)   Exercise Vital Sign    Days of Exercise per Week: 0 days    Minutes of Exercise per Session: 0 min  Stress: No Stress Concern Present (05/24/2024)   [employer] of Occupational Health - Occupational Stress Questionnaire    Feeling of Stress: Not at all  Social Connections: Moderately Isolated (05/24/2024)   Social Connection and Isolation Panel    Frequency of Communication with Friends and Family: Three times a week    Frequency of Social Gatherings with Friends and Family: Once a week    Attends Religious Services: More than 4 times per year    Active Member of [employer] or Organizations: No    Attends Banker Meetings: Never    Marital Status: Widowed    Tobacco Counseling Ready to quit: Not Answered Counseling given: Not Answered    Clinical  Intake:  Pre-visit preparation completed: Yes  Pain : No/denies pain     Nutritional Status: BMI 25 -29 Overweight Nutritional Risks: Nausea/ vomitting/ diarrhea (nauseous off and on) Diabetes: Yes CBG done?: No Did pt. bring in CBG monitor from home?: No  Lab Results  Component Value Date   HGBA1C 6.7 (H) 04/20/2024   HGBA1C 7.1 (H) 12/20/2023   HGBA1C 6.8 (H) 09/21/2023     How often do you need to have someone help you when you read instructions, pamphlets, or other written materials from your doctor or pharmacy?: 1 - Never  Interpreter Needed?: No  Information entered by :: NAllen LPN   Activities of Daily Living     05/24/2024   12:29 PM  In your present state of health, do you have any difficulty performing the following activities:  Hearing? 0  Comment ringing in ears  Vision? 1  Comment blurriness sometimes  Difficulty concentrating or making decisions? 0  Walking or climbing stairs? 0  Dressing or bathing? 0  Doing errands, shopping? 0  Preparing Food and eating ? N  Using the Toilet? N  In the past six months, have you accidently leaked urine? N  Do you have problems with loss of bowel control? N  Managing your Medications? N  Managing your Finances? N  Housekeeping or managing your Housekeeping? N    Patient Care Team: Jarold Medici, MD as PCP - General (Internal Medicine) Pearson, Vallie J, [facility] (Inactive) (Pharmacist) Pa, [facility]  I have updated your Care Teams any recent Medical Services you may have received from other providers in the past year.     Assessment:   This is a routine wellness examination for Alec Solis.  Hearing/Vision screen Hearing Screening - Comments:: Denies hearing issues Vision Screening - Comments:: Regular eye exams, Alec Solis  Eye Care   Goals Addressed             This Visit's Progress    Patient Stated       05/24/2024, stay alive       Depression Screen     05/24/2024   12:37 PM 04/20/2024    11:52 AM 04/20/2023   10:04 AM 03/29/2023   10:08 AM 12/18/2022    9:05 AM 09/17/2022    9:09 AM 05/18/2022    8:54 AM  PHQ 2/9 Scores  PHQ - 2 Score 0 0 0 0 3 0 0  PHQ- 9 Score 3 0 0 0 8  0    Fall Risk     05/24/2024   12:36 PM 04/20/2024   11:52 AM 12/20/2023   11:57 AM 09/21/2023   11:52 AM 04/20/2023   10:03 AM  Fall Risk   Falls in the past year? 0 0 0 0 0  Number falls in past yr: 0 0 0 0 0  Injury with Fall? 0 0 0 0 0  Risk for fall due to : Medication side effect No Fall Risks No Fall Risks No Fall Risks Medication side effect  Follow up Falls evaluation completed;Falls prevention discussed Falls evaluation completed Falls evaluation completed Falls evaluation completed Falls prevention discussed;Falls evaluation completed    MEDICARE RISK AT HOME:  Medicare Risk at Home Any stairs in or around the home?: Yes If so, are there any without handrails?: No Home free of loose throw rugs in walkways, pet beds, electrical cords, etc?: Yes Adequate lighting in your home to reduce risk of falls?: Yes Life alert?: No Use of a cane, walker or w/c?: No Grab bars in the bathroom?: No Shower chair or bench in shower?: Yes Elevated toilet seat or a handicapped toilet?: No  TIMED UP AND GO:  Was the test performed?  Yes  Length of time to ambulate 10 feet: 5 sec Gait steady and fast without use of assistive device  Cognitive Function: 6CIT completed        05/24/2024   12:37 PM 04/20/2023   10:04 AM 03/11/2022    2:43 PM 01/30/2021    9:07 AM 05/23/2019    3:55 PM  6CIT Screen  What Year? 0 points 0 points 0 points 0 points 0 points  What month? 0 points 0 points 0 points 0 points 0 points  What time? 0 points 0 points 0 points 3 points 0 points  Count back from 20 0 points 0 points 0 points 0 points 0 points  Months in reverse 0 points 0 points 0 points 0 points 2 points  Repeat phrase 2 points 2 points 0 points 2 points 0 points  Total Score 2 points 2 points 0 points 5 points  2 points    Immunizations Immunization History  Administered Date(s) Administered   Fluad Quad(high Dose 65+) 05/18/2022   Fluad Trivalent(High Dose 65+) 05/19/2023   INFLUENZA, HIGH DOSE SEASONAL PF 05/25/2018, 04/19/2019, 05/16/2024   Influenza-Unspecified 05/24/2013, 05/31/2018, 05/07/2021   PFIZER(Purple Top)SARS-COV-2 Vaccination 10/23/2019, 11/15/2019, 09/12/2020, 06/19/2022   PNEUMOCOCCAL CONJUGATE-20 10/30/2021   Pfizer Covid-19 Vaccine Bivalent Booster 47yrs & up 05/29/2021   Pneumococcal Polysaccharide-23 05/24/2013, 05/24/2017   Tdap 05/22/2013   Zoster Recombinant(Shingrix ) 06/19/2022    Screening Tests Health Maintenance  Topic Date Due   COVID-19 Vaccine (6 - 2025-26 season) 04/24/2024   DTaP/Tdap/Td (2 - Td or Tdap) 07/18/2024 (Originally 05/23/2023)   Zoster Vaccines- Shingrix  (2 of 2) 07/21/2024 (  Originally 08/14/2022)   FOOT EXAM  06/24/2024   OPHTHALMOLOGY EXAM  09/08/2024   HEMOGLOBIN A1C  10/21/2024   Medicare Annual Wellness (AWV)  05/24/2025   Pneumococcal Vaccine: 50+ Years  Completed   Influenza Vaccine  Completed   HPV VACCINES  Aged Out   Meningococcal B Vaccine  Aged Out    Health Maintenance Items Addressed: States does not need covid vaccine.  Additional Screening:  Vision Screening: Recommended annual ophthalmology exams for early detection of glaucoma and other disorders of the eye. Is the patient up to date with their annual eye exam?  Yes  Who is the provider or what is the name of the office in which the patient attends annual eye exams? [facility]  Dental Screening: Recommended annual dental exams for proper oral hygiene  Community Resource Referral / Chronic Care Management: CRR required this visit?  No   CCM required this visit?  No   Plan:    I have personally reviewed and noted the following in the patient's chart:   Medical and social history Use of alcohol , tobacco or illicit drugs  Current medications and  supplements including opioid prescriptions. Patient is not currently taking opioid prescriptions. Functional ability and status Nutritional status Physical activity Advanced directives List of other physicians Hospitalizations, surgeries, and ER visits in previous 12 months Vitals Screenings to include cognitive, depression, and falls Referrals and appointments  In addition, I have reviewed and discussed with patient certain preventive protocols, quality metrics, and best practice recommendations. A written personalized care plan for preventive services as well as general preventive health recommendations were provided to patient.   Alec FORBES Dawn, LPN   [id number]   After Visit Summary: (In Person-Printed) AVS printed and given to the patient  Notes: Nothing significant to report at this time.

## 2024-05-24 NOTE — Patient Instructions (Addendum)
 [name],  Thank you for taking the time for your Medicare Wellness Visit. I appreciate your continued commitment to your health goals. Please review the care plan we discussed, and feel free to reach out if I can assist you further.  Medicare recommends these wellness visits once per year to help you and your care team stay ahead of potential health issues. These visits are designed to focus on prevention, allowing your provider to concentrate on managing your acute and chronic conditions during your regular appointments.  Please note that Annual Wellness Visits do not include a physical exam. Some assessments may be limited, especially if the visit was conducted virtually. If needed, we may recommend a separate in-person follow-up with your provider.  Ongoing Care Seeing your primary care provider every 3 to 6 months helps us  monitor your health and provide consistent, personalized care.   Referrals If a referral was made during today's visit and you haven't received any updates within two weeks, please contact the referred provider directly to check on the status.  Recommended Screenings:  Health Maintenance  Topic Date Due   COVID-19 Vaccine (6 - 2025-26 season) 04/24/2024   DTaP/Tdap/Td vaccine (2 - Td or Tdap) 07/18/2024*   Zoster (Shingles) Vaccine (2 of 2) 07/21/2024*   Complete foot exam   06/24/2024   Eye exam for diabetics  09/08/2024   Hemoglobin A1C  10/21/2024   Medicare Annual Wellness Visit  05/24/2025   Pneumococcal Vaccine for age over 96  Completed   Flu Shot  Completed   HPV Vaccine  Aged Out   Meningitis B Vaccine  Aged Out  *Topic was postponed. The date shown is not the original due date.       05/24/2024   12:35 PM  Advanced Directives  Does Patient Have a Medical Advance Directive? No   Advance Care Planning is important because it: Ensures you receive medical care that aligns with your values, goals, and preferences. Provides guidance to your family  and loved ones, reducing the emotional burden of decision-making during critical moments.  Vision: Annual vision screenings are recommended for early detection of glaucoma, cataracts, and diabetic retinopathy. These exams can also reveal signs of chronic conditions such as diabetes and high blood pressure.  Dental: Annual dental screenings help detect early signs of oral cancer, gum disease, and other conditions linked to overall health, including heart disease and diabetes.  Please see the attached documents for additional preventive care recommendations.

## 2024-05-29 ENCOUNTER — Other Ambulatory Visit: Payer: Self-pay | Admitting: Internal Medicine

## 2024-06-05 ENCOUNTER — Other Ambulatory Visit: Payer: Self-pay | Admitting: Internal Medicine

## 2024-07-04 ENCOUNTER — Telehealth: Payer: Self-pay

## 2024-07-04 NOTE — Telephone Encounter (Signed)
 PAP: Patient assistance application for Tresiba  through [employer] has been mailed to pt's home address on file. Provider portion of application will be faxed to provider's office.patient portion e-filed

## 2024-07-05 ENCOUNTER — Encounter: Payer: Self-pay | Admitting: Pharmacist

## 2024-07-05 NOTE — Progress Notes (Signed)
   07/05/2024  Patient ID: Alec Solis, male   DOB: [birth year], 89 y.o.   MRN: [id number]  Completed and obtained Provider signature on patient assistance renewal forms for [employer] tresiba  and novo fine needles.  Forms were faxed to Luke Mall, CPhT for processing and scanning.  Lab Results  Component Value Date   HGBA1C 6.7 (H) 04/20/2024   HGBA1C 7.1 (H) 12/20/2023   HGBA1C 6.8 (H) 09/21/2023   Cassius DOROTHA Brought, PharmD, BCACP Clinical Pharmacist [phone]

## 2024-07-06 ENCOUNTER — Other Ambulatory Visit (HOSPITAL_COMMUNITY): Payer: Self-pay

## 2024-07-07 NOTE — Telephone Encounter (Signed)
 PAP: Application for Alec Solis has been submitted to [employer], via fax

## 2024-07-11 NOTE — Telephone Encounter (Signed)
 PAP: Patient assistance application for Tresiba  has been approved by PAP Companies: [employer] from 08/24/2024 to 08/23/2025. Medication should be delivered to PAP Delivery: Provider's office. For further shipping updates, please contact [employer] at [phone]. Patient ID is: approval letter in media.

## 2024-07-26 ENCOUNTER — Encounter: Payer: Self-pay | Admitting: Podiatry

## 2024-07-26 ENCOUNTER — Ambulatory Visit: Admitting: Podiatry

## 2024-07-26 DIAGNOSIS — M79675 Pain in left toe(s): Secondary | ICD-10-CM | POA: Diagnosis not present

## 2024-07-26 DIAGNOSIS — E1122 Type 2 diabetes mellitus with diabetic chronic kidney disease: Secondary | ICD-10-CM | POA: Diagnosis not present

## 2024-07-26 DIAGNOSIS — B351 Tinea unguium: Secondary | ICD-10-CM

## 2024-07-26 DIAGNOSIS — M79674 Pain in right toe(s): Secondary | ICD-10-CM | POA: Diagnosis not present

## 2024-07-26 DIAGNOSIS — E119 Type 2 diabetes mellitus without complications: Secondary | ICD-10-CM | POA: Diagnosis not present

## 2024-07-26 DIAGNOSIS — N183 Chronic kidney disease, stage 3 unspecified: Secondary | ICD-10-CM

## 2024-07-26 DIAGNOSIS — M2012 Hallux valgus (acquired), left foot: Secondary | ICD-10-CM | POA: Diagnosis not present

## 2024-07-26 DIAGNOSIS — Q828 Other specified congenital malformations of skin: Secondary | ICD-10-CM

## 2024-07-26 DIAGNOSIS — Z0189 Encounter for other specified special examinations: Secondary | ICD-10-CM

## 2024-07-26 DIAGNOSIS — M2041 Other hammer toe(s) (acquired), right foot: Secondary | ICD-10-CM | POA: Diagnosis not present

## 2024-07-26 DIAGNOSIS — M2042 Other hammer toe(s) (acquired), left foot: Secondary | ICD-10-CM

## 2024-07-26 DIAGNOSIS — M2011 Hallux valgus (acquired), right foot: Secondary | ICD-10-CM

## 2024-07-26 NOTE — Progress Notes (Signed)
 Subjective:  Patient ID: Alec Solis, male    DOB: September 23, 1932,  MRN: [id number]  Alec Solis presents to clinic today for for annual diabetic foot examination, at risk foot care. Pt has h/o NIDDM with chronic kidney disease, and painful porokeratotic lesion(s) b/l feet and painful mycotic toenails that limit ambulation. Painful toenails interfere with ambulation. Aggravating factors include wearing enclosed shoe gear. Pain is relieved with periodic professional debridement. Painful porokeratotic lesions are aggravated when weightbearing with and without shoegear. Pain is relieved with periodic professional debridement.  Chief Complaint  Patient presents with   [facility]    Rm17 Diabetic foot care/ Blood sugar 95/ A1c 6.7/ Dr. Catheryn Slocumb last visit March 31, 2024.   New problem(s): None.   PCP is Slocumb Catheryn, MD.  No Known Allergies  Review of Systems: Negative except as noted in the HPI.  Objective: No changes noted in today's physical examination. There were no vitals filed for this visit. Alec Solis is a pleasant 89 y.o. male WD, WN in NAD. AAO x 3.   Diabetic foot exam was performed with the following findings:   Normal sensation of 10g monofilament Vascular Examination: CFT <3 seconds b/l. DP/PT pulses faintly palpable b/l. Digital hair absent.  Skin temperature gradient warm to warm b/l. No pain with calf compression. No ischemia or gangrene. No cyanosis or clubbing noted b/l. Trace edema noted BLE.   Neurological Examination: Sensation grossly intact b/l with 10 gram monofilament. Vibratory sensation intact b/l.   Dermatological Examination: Pedal skin warm and supple b/l.   No open wounds. No interdigital macerations.  Toenails 1-5 b/l thick, discolored, elongated with subungual debris and pain on dorsal palpation.    Porokeratotic lesion(s) submet head 3 left foot and submet head 5 right foot. No erythema, no edema, no drainage, no fluctuance.  Musculoskeletal  Examination: Muscle strength 5/5 to all lower extremity muscle groups bilaterally. HAV with bunion bilaterally and hammertoes 2-5 b/l.SABRA No pain, crepitus or joint limitation noted with ROM b/l LE.  Patient ambulates independently without assistive aids.  Radiographs: None    Assessment/Plan: 1. Pain due to onychomycosis of toenails of both feet   2. Porokeratosis   3. Hallux valgus, acquired, bilateral   4. Acquired hammertoes of both feet   5. Diabetes mellitus with stage 3 chronic kidney disease (HCC)   6. Encounter for diabetic foot exam [facility])     Consent given for treatment. Diabetic foot examination performed. All patient's and/or POA's questions/concerns addressed on today's visit. Mycotic toenails 1-5 b/l debrided in length and girth without incident. Porokeratotic lesion(s) submet head 3 left foot and submet head 5 right foot pared with sharp debridement without incident. Continue foot and shoe inspections daily. Monitor blood glucose per PCP/Endocrinologist's recommendations.Continue soft, supportive shoe gear daily. Report any pedal injuries to medical professional. Call office if there are any quesitons/concerns.  Return in about 3 months (around 10/24/2024).  Alec Solis, [name]      [facility] LOCATION: [address], [place] [address]                   Office [phone]   [facility] LOCATION: [address] [employer] [place], [place] [address] Office [phone]

## 2024-08-21 ENCOUNTER — Other Ambulatory Visit: Payer: Self-pay | Admitting: Internal Medicine

## 2024-09-05 ENCOUNTER — Encounter: Payer: Self-pay | Admitting: Internal Medicine

## 2024-09-05 ENCOUNTER — Ambulatory Visit (INDEPENDENT_AMBULATORY_CARE_PROVIDER_SITE_OTHER): Payer: Self-pay | Admitting: Internal Medicine

## 2024-09-05 VITALS — BP 112/60 | HR 68 | Temp 98.6°F | Ht 70.0 in | Wt 178.0 lb

## 2024-09-05 DIAGNOSIS — I1310 Hypertensive heart and chronic kidney disease without heart failure, with stage 1 through stage 4 chronic kidney disease, or unspecified chronic kidney disease: Secondary | ICD-10-CM | POA: Diagnosis not present

## 2024-09-05 DIAGNOSIS — I129 Hypertensive chronic kidney disease with stage 1 through stage 4 chronic kidney disease, or unspecified chronic kidney disease: Secondary | ICD-10-CM

## 2024-09-05 DIAGNOSIS — Z23 Encounter for immunization: Secondary | ICD-10-CM | POA: Diagnosis not present

## 2024-09-05 DIAGNOSIS — N1832 Chronic kidney disease, stage 3b: Secondary | ICD-10-CM | POA: Diagnosis not present

## 2024-09-05 DIAGNOSIS — M1A4710 Other secondary chronic gout, right ankle and foot, without tophus (tophi): Secondary | ICD-10-CM | POA: Diagnosis not present

## 2024-09-05 DIAGNOSIS — I2510 Atherosclerotic heart disease of native coronary artery without angina pectoris: Secondary | ICD-10-CM

## 2024-09-05 DIAGNOSIS — Z794 Long term (current) use of insulin: Secondary | ICD-10-CM

## 2024-09-05 DIAGNOSIS — J432 Centrilobular emphysema: Secondary | ICD-10-CM | POA: Diagnosis not present

## 2024-09-05 DIAGNOSIS — E1122 Type 2 diabetes mellitus with diabetic chronic kidney disease: Secondary | ICD-10-CM

## 2024-09-05 DIAGNOSIS — E782 Mixed hyperlipidemia: Secondary | ICD-10-CM | POA: Diagnosis not present

## 2024-09-05 DIAGNOSIS — D631 Anemia in chronic kidney disease: Secondary | ICD-10-CM | POA: Diagnosis not present

## 2024-09-05 LAB — IRON,TIBC AND FERRITIN PANEL
Ferritin: 90 ng/mL (ref 30–400)
Iron Saturation: 16 % (ref 15–55)
Iron: 48 ug/dL (ref 38–169)
Total Iron Binding Capacity: 302 ug/dL (ref 250–450)
UIBC: 254 ug/dL (ref 111–343)

## 2024-09-05 MED ORDER — ALLOPURINOL 100 MG PO TABS: 100.0000 mg | ORAL_TABLET | Freq: Every day | ORAL | 1 refills | Status: AC

## 2024-09-05 MED ORDER — ATORVASTATIN CALCIUM 20 MG PO TABS: 20.0000 mg | ORAL_TABLET | Freq: Every day | ORAL | 1 refills | Status: AC

## 2024-09-05 MED ORDER — AMLODIPINE BESYLATE 5 MG PO TABS: ORAL_TABLET | ORAL | 1 refills | Status: AC

## 2024-09-05 MED ORDER — BOOSTRIX 5-2.5-18.5 LF-MCG/0.5 IM SUSY: 0.5000 mL | PREFILLED_SYRINGE | Freq: Once | INTRAMUSCULAR | 0 refills | Status: AC

## 2024-09-05 NOTE — Progress Notes (Signed)
 I,Jameka J Llittleton, CMA,acting as a neurosurgeon for [name], MD.,have documented all relevant documentation on the behalf of [name], MD,as directed by  [name], MD while in the presence of [name], MD.  Subjective:  Patient ID: Alec Solis , male    DOB: 04/28/1933 , 89 y.o.   MRN: [id number]  Chief Complaint  Patient presents with   Diabetes    Patient presents today for a diabetes check. Patient reports compliance with his meds. Patient denies having any chest pain, sob or headaches at this time. Patient doesn't have any questions or concerns at this time.    HPI Discussed the use of AI scribe software for clinical note transcription with the patient, who gave verbal consent to proceed.  History of Present Illness Alec Solis is an 89 year old male with diabetes and hypertension who presents for a diabetes and blood pressure check.  He has been experiencing low blood sugar levels in the mornings, with readings as low as 69 mg/dL and typically in the low 80s. He attributes this to not eating much. His current insulin  regimen includes Tresiba  at 18 units at night, and he has about 18 pens left. Blood sugar levels are usually in the 70s, 80s, and 90s, with occasional dips into the 60s when he does not eat. He does not use a continuous glucose monitor due to cost concerns.  He is taking allopurinol  twice a day for gout and requires a refill. He has not experienced any recent gout attacks. He also takes aspirin  and atorvastatin  daily. He mentions a past issue with ankle swelling, which he attributes to amlodipine .  He has a history of kidney issues and was previously taken off Citracal by his kidney doctor. He has not had a kidney function check since March 2025 and is due for a follow-up. He mentions a history of anemia related to kidney function and has not received treatment for it in recent years. He has been eating more spinach recently, which he enjoys, but it  could contribute to kidney stones.  He has cut out Pepsi from his diet.   Diabetes He presents for his follow-up diabetic visit. He has type 2 diabetes mellitus. His disease course has been improving. There are no hypoglycemic associated symptoms. Pertinent negatives for hypoglycemia include no dizziness or headaches. Pertinent negatives for diabetes include no blurred vision, no chest pain, no polydipsia, no polyphagia and no polyuria. Symptoms are improving. There are no diabetic complications. Risk factors for coronary artery disease include sedentary lifestyle, male sex, diabetes mellitus and hypertension. Current diabetic treatment includes insulin  injections. He is compliant with treatment most of the time. Meal planning includes avoidance of concentrated sweets. He participates in exercise intermittently. His home blood glucose trend is decreasing steadily. (Blood sugars are running lower down to 86, he is currently taking 20 units nightly) Eye exam is current.  Hypertension This is a chronic problem. The current episode started more than 1 year ago. The problem has been gradually improving since onset. The problem is controlled. Pertinent negatives include no blurred vision, chest pain, headaches, palpitations or shortness of breath. Risk factors for coronary artery disease include diabetes mellitus, dyslipidemia, sedentary lifestyle and male gender. Identifiable causes of hypertension include chronic renal disease.  Hyperlipidemia This is a chronic problem. Exacerbating diseases include chronic renal disease and diabetes. Pertinent negatives include no chest pain or shortness of breath. Current antihyperlipidemic treatment includes statins. Compliance problems include adherence to exercise.  Risk  factors for coronary artery disease include diabetes mellitus, dyslipidemia, male sex, hypertension and a sedentary lifestyle.     Past Medical History:  Diagnosis Date   Anemia    Anxiety     Arthritis    Cataract    Chronic kidney disease    Depression    Diabetes mellitus without complication (HCC)    type II    GERD (gastroesophageal reflux disease)    Gout    Heart murmur    Hypertension    Myocardial infarction (HCC)    minor Mi- years ago -    Varicose veins of left lower extremity      Family History  Problem Relation Age of Onset   Breast cancer Mother    Stomach cancer Mother    Diabetes Mellitus II Maternal Aunt    Lung cancer Neg Hx    Lung disease Neg Hx      Current Outpatient Medications:    acetaminophen  (TYLENOL ) 650 MG CR tablet, Take 650 mg by mouth every 8 (eight) hours as needed for pain. , Disp: , Rfl:    aspirin  81 MG tablet, Take 81 mg by mouth daily. , Disp: , Rfl:    B-D ULTRAFINE III SHORT PEN 31G X 8 MM MISC, USE AS DIRECTED FOUR TIMES DAILY, Disp: 100 each, Rfl: 3   benzonatate  (TESSALON ) 100 MG capsule, Take 1 capsule (100 mg total) by mouth every 8 (eight) hours., Disp: 21 capsule, Rfl: 0   carboxymethylcellulose (REFRESH PLUS) 0.5 % SOLN, Place 1 drop into both eyes 3 (three) times daily as needed (Watery eyes). , Disp: , Rfl:    carvedilol  (COREG ) 12.5 MG tablet, Take 12.5 mg by mouth 2 (two) times daily with a meal., Disp: , Rfl:    ferrous sulfate 325 (65 FE) MG tablet, Take 325 mg by mouth daily with breakfast., Disp: , Rfl:    fluticasone  (FLONASE ) 50 MCG/ACT nasal spray, Place 2 sprays into both nostrils daily., Disp: 16 g, Rfl: 2   furosemide (LASIX) 40 MG tablet, Take 40 mg by mouth daily as needed for fluid or edema. 2 Tablets by mouth daily as needed, Disp: , Rfl:    Glucagon  (GVOKE HYPOPEN  2-PACK) 0.5 MG/0.1ML SOAJ, Inject 0.1 mLs into the skin as needed (inject SQ under the skin for BS less than 70)., Disp: 0.2 mL, Rfl: 1   glucose blood (TRUE METRIX BLOOD GLUCOSE TEST) test strip, Use as directed to check blood sugars 3 times per day dx: e11.65, Disp: 300 each, Rfl: 5   insulin  degludec (TRESIBA  FLEXTOUCH) 200 UNIT/ML  FlexTouch Pen, Inject 18 Units into the skin daily., Disp: 27 mL, Rfl: 1   Insulin  Pen Needle 32G X 8 MM MISC, Use as directed, Disp: 100 each, Rfl: 2   Omega-3 Fatty Acids (FISH OIL OMEGA-3 PO), Take 1,200 mg by mouth daily. , Disp: , Rfl:    ondansetron  (ZOFRAN ) 4 MG tablet, TAKE 1 TABLET(4 MG) BY MOUTH DAILY AS NEEDED FOR NAUSEA OR VOMITING, Disp: 30 tablet, Rfl: 0   pantoprazole  (PROTONIX ) 40 MG tablet, TAKE 1 TABLET(40 MG) BY MOUTH DAILY, Disp: 90 tablet, Rfl: 0   sodium bicarbonate  650 MG tablet, Take 1,300 mg by mouth 2 (two) times daily., Disp: , Rfl:    TRUEplus Lancets 30G MISC, USE AS DIRECTED TO TEST BLOOD SUGAR THREE TIMES DAILY, Disp: 300 each, Rfl: 11   vitamin B-12 (CYANOCOBALAMIN ) 1000 MCG tablet, Take 1,000 mcg by mouth daily., Disp: , Rfl:  allopurinol  (ZYLOPRIM ) 100 MG tablet, Take 1 tablet (100 mg total) by mouth daily. TAKE 1 TABLET(100 MG) BY MOUTH TWICE DAILY, Disp: 180 tablet, Rfl: 1   amLODipine  (NORVASC ) 5 MG tablet, TAKE 1 TABLET(5 MG) BY MOUTH DAILY, Disp: 90 tablet, Rfl: 1   atorvastatin  (LIPITOR) 20 MG tablet, Take 1 tablet (20 mg total) by mouth daily. TAKE 1 TABLET(20 MG) BY MOUTH DAILY, Disp: 90 tablet, Rfl: 1   No Known Allergies   Review of Systems  Constitutional: Negative.   Eyes: Negative.  Negative for blurred vision.  Respiratory: Negative.  Negative for shortness of breath.   Cardiovascular: Negative.  Negative for chest pain and palpitations.  Gastrointestinal: Negative.   Endocrine: Negative for polydipsia, polyphagia and polyuria.  Musculoskeletal: Negative.   Skin: Negative.   Neurological:  Negative for dizziness and headaches.  Psychiatric/Behavioral: Negative.       Today's Vitals   09/05/24 1022  BP: 112/60  Pulse: 68  Temp: 98.6 F (37 C)  TempSrc: Oral  Weight: 178 lb (80.7 kg)  Height: 5' 10 (1.778 m)  PainSc: 0-No pain   Body mass index is 25.54 kg/m.  Wt Readings from Last 3 Encounters:  09/05/24 178 lb (80.7 kg)   05/24/24 176 lb 6.4 oz (80 kg)  05/16/24 180 lb 6.4 oz (81.8 kg)    The ASCVD Risk score (Arnett DK, et al., 2019) failed to calculate for the following reasons:   The 2019 ASCVD risk score is only valid for ages 90 to 53   Risk score cannot be calculated because patient has a medical history suggesting prior/existing ASCVD   * - Cholesterol units were assumed  Objective:  Physical Exam Vitals and nursing note reviewed.  Constitutional:      Appearance: Normal appearance.  HENT:     Head: Normocephalic and atraumatic.     Nose:     Comments: Masked     Mouth/Throat:     Comments: Masked  Eyes:     Extraocular Movements: Extraocular movements intact.  Cardiovascular:     Rate and Rhythm: Normal rate and regular rhythm.     Heart sounds: Normal heart sounds.  Pulmonary:     Effort: Pulmonary effort is normal.     Breath sounds: Normal breath sounds.  Musculoskeletal:     Cervical back: Normal range of motion.  Skin:    General: Skin is warm.  Neurological:     General: No focal deficit present.     Mental Status: He is alert.  Psychiatric:        Mood and Affect: Mood normal.         Assessment And Plan:   Assessment & Plan Type 2 diabetes mellitus with stage 3b chronic kidney disease, with long-term current use of insulin  (HCC) Chronic.  Blood sugars stable, occasional hypoglycemia likely from reduced intake. No recent gout attacks. Spinach intake noted for kidney stone risk. - Adjust insulin  based on blood sugar and intake. - Checked liver and kidney function. - Checked blood count for anemia. - Refilled allopurinol  prescription. Hypertensive heart and renal disease with renal failure, stage 1 through stage 4 or unspecified chronic kidney disease, without heart failure Chronic, controlled. Blood pressure managed with amlodipine  5 mg daily. Concerns about potential side effects like leg swelling, especially in summer.  - Kidney function stable per  nephrologist. Mixed hyperlipidemia Chronic, LDL goal is less than 70.  He is now on atorvastatin . Will check fasting lipid panel at his next  visit.  Centrilobular emphysema (HCC) Chronic.  Pulmonary input appreciated. Most recent Pulmonary notes reviewed, since he has been asymptomatic, he only requires prn Pulmonary follow up at this time. Most recent CT chest reviewed, he does have 4mm nodules, based on Fleischner society guidelines, these require no further follow up.  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 Currently on allopurinol .  - Refill allopurinol . Immunization due [facility] states he must receive Tdap at pharmacy.  - Rx Boostrix  sent to pharmacy.  Anemia due to stage 3b chronic kidney disease (HCC) Anemia previously treated, current status assessed with blood count. - Checked blood count to assess current anemia status. Coronary artery calcification seen on CAT scan Chronic, LDL goal is less than 70.  He will continue with ASA 81mg  daily and atorvastatin  20mg  daily.  - Follow heart healthy lifestyle - Maintain an active lifestyle    Orders Placed This Encounter  Procedures   Microscopic Examination   CBC   CMP14+EGFR   Hemoglobin A1c   Urinalysis, Complete (81001)   Microalbumin / creatinine urine ratio   Uric acid   Iron, TIBC and Ferritin Panel   Return for controlled DM check-4 months.  Patient was given opportunity to ask questions. Patient verbalized understanding of the plan and was able to repeat key elements of the plan. All questions were answered to their satisfaction.    I, [name], MD, have reviewed all documentation for this visit. The documentation on 09/05/2024 for the exam, diagnosis, procedures, and orders are all accurate and complete.   IF YOU HAVE BEEN REFERRED TO A SPECIALIST, IT MAY TAKE 1-2 WEEKS TO SCHEDULE/PROCESS THE REFERRAL. IF YOU HAVE NOT HEARD FROM US /SPECIALIST IN TWO WEEKS, PLEASE  GIVE US  A CALL AT [phone] X [phone].

## 2024-09-05 NOTE — Patient Instructions (Signed)

## 2024-09-06 LAB — CMP14+EGFR
ALT: 8 IU/L (ref 0–44)
AST: 13 IU/L (ref 0–40)
Albumin: 4.2 g/dL (ref 3.7–4.7)
Alkaline Phosphatase: 132 IU/L — ABNORMAL HIGH (ref 48–129)
BUN/Creatinine Ratio: 17 (ref 10–24)
BUN: 28 mg/dL — ABNORMAL HIGH (ref 8–27)
Bilirubin Total: 0.3 mg/dL (ref 0.0–1.2)
CO2: 17 mmol/L — ABNORMAL LOW (ref 20–29)
Calcium: 9.4 mg/dL (ref 8.6–10.2)
Chloride: 110 mmol/L — ABNORMAL HIGH (ref 96–106)
Creatinine, Ser: 1.67 mg/dL — ABNORMAL HIGH (ref 0.76–1.27)
Globulin, Total: 3.1 g/dL (ref 1.5–4.5)
Glucose: 102 mg/dL — ABNORMAL HIGH (ref 70–99)
Potassium: 4.6 mmol/L (ref 3.5–5.2)
Sodium: 145 mmol/L — ABNORMAL HIGH (ref 134–144)
Total Protein: 7.3 g/dL (ref 6.0–8.5)
eGFR: 39 mL/min/1.73 — ABNORMAL LOW

## 2024-09-06 LAB — URINALYSIS, COMPLETE
Bilirubin, UA: NEGATIVE
Glucose, UA: NEGATIVE
Ketones, UA: NEGATIVE
Nitrite, UA: NEGATIVE
RBC, UA: NEGATIVE
Specific Gravity, UA: 1.019 (ref 1.005–1.030)
Urobilinogen, Ur: 0.2 mg/dL (ref 0.2–1.0)
pH, UA: 5.5 (ref 5.0–7.5)

## 2024-09-06 LAB — MICROALBUMIN / CREATININE URINE RATIO
Creatinine, Urine: 190.1 mg/dL
Microalb/Creat Ratio: 29 mg/g{creat} (ref 0–29)
Microalbumin, Urine: 56 ug/mL

## 2024-09-06 LAB — MICROSCOPIC EXAMINATION
Bacteria, UA: NONE SEEN
Casts: NONE SEEN /LPF
Epithelial Cells (non renal): NONE SEEN /HPF (ref 0–10)
RBC, Urine: NONE SEEN /HPF (ref 0–2)

## 2024-09-06 LAB — CBC
Hematocrit: 36.2 % — ABNORMAL LOW (ref 37.5–51.0)
Hemoglobin: 10.6 g/dL — ABNORMAL LOW (ref 13.0–17.7)
MCH: 23.5 pg — ABNORMAL LOW (ref 26.6–33.0)
MCHC: 29.3 g/dL — ABNORMAL LOW (ref 31.5–35.7)
MCV: 80 fL (ref 79–97)
Platelets: 265 x10E3/uL (ref 150–450)
RBC: 4.51 x10E6/uL (ref 4.14–5.80)
RDW: 18.1 % — ABNORMAL HIGH (ref 11.6–15.4)
WBC: 4.8 x10E3/uL (ref 3.4–10.8)

## 2024-09-06 LAB — HEMOGLOBIN A1C
Est. average glucose Bld gHb Est-mCnc: 126 mg/dL
Hgb A1c MFr Bld: 6 % — ABNORMAL HIGH (ref 4.8–5.6)

## 2024-09-06 LAB — URIC ACID: Uric Acid: 7.2 mg/dL (ref 3.8–8.4)

## 2024-09-11 ENCOUNTER — Ambulatory Visit: Payer: Self-pay | Admitting: Internal Medicine

## 2024-09-11 NOTE — Progress Notes (Incomplete)
 I,Jameka J Llittleton, CMA,acting as a neurosurgeon for [name], MD.,have documented all relevant documentation on the behalf of [name], MD,as directed by  [name], MD while in the presence of [name], MD.  Subjective:  Patient ID: Alec Solis , male    DOB: February 28, 1933 , 89 y.o.   MRN: [id number]  Chief Complaint  Patient presents with   Diabetes    Patient presents today for a diabetes check. Patient reports compliance with his meds. Patient denies having any chest pain, sob or headaches at this time. Patient doesn't have any questions or concerns at this time.    HPI Discussed the use of AI scribe software for clinical note transcription with the patient, who gave verbal consent to proceed.  History of Present Illness Alec Solis is an 89 year old male with diabetes and hypertension who presents for a diabetes and blood pressure check.  He has been experiencing low blood sugar levels in the mornings, with readings as low as 69 mg/dL and typically in the low 80s. He attributes this to not eating much. His current insulin  regimen includes Tresiba  at 18 units at night, and he has about 18 pens left. Blood sugar levels are usually in the 70s, 80s, and 90s, with occasional dips into the 60s when he does not eat. He does not use a continuous glucose monitor due to cost concerns.  He is taking allopurinol  twice a day for gout and requires a refill. He has not experienced any recent gout attacks. He also takes aspirin  and atorvastatin  daily. He mentions a past issue with ankle swelling, which he attributes to amlodipine .  He has a history of kidney issues and was previously taken off Citracal by his kidney doctor. He has not had a kidney function check since March 2025 and is due for a follow-up. He mentions a history of anemia related to kidney function and has not received treatment for it in recent years. He has been eating more spinach recently, which he enjoys, but it  could contribute to kidney stones.  He has cut out Pepsi from his diet.   Diabetes He presents for his follow-up diabetic visit. He has type 2 diabetes mellitus. His disease course has been improving. There are no hypoglycemic associated symptoms. Pertinent negatives for hypoglycemia include no dizziness or headaches. Pertinent negatives for diabetes include no blurred vision, no chest pain, no polydipsia, no polyphagia and no polyuria. Symptoms are improving. There are no diabetic complications. Risk factors for coronary artery disease include sedentary lifestyle, male sex, diabetes mellitus and hypertension. Current diabetic treatment includes insulin  injections. He is compliant with treatment most of the time. Meal planning includes avoidance of concentrated sweets. He participates in exercise intermittently. His home blood glucose trend is decreasing steadily. (Blood sugars are running lower down to 86, he is currently taking 20 units nightly) Eye exam is current.  Hypertension This is a chronic problem. The current episode started more than 1 year ago. The problem has been gradually improving since onset. The problem is controlled. Pertinent negatives include no blurred vision, chest pain, headaches, palpitations or shortness of breath. Risk factors for coronary artery disease include diabetes mellitus, dyslipidemia, sedentary lifestyle and male gender. Identifiable causes of hypertension include chronic renal disease.  Hyperlipidemia This is a chronic problem. Exacerbating diseases include chronic renal disease and diabetes. Pertinent negatives include no chest pain or shortness of breath. Current antihyperlipidemic treatment includes statins. Compliance problems include adherence to exercise.  Risk  factors for coronary artery disease include diabetes mellitus, dyslipidemia, male sex, hypertension and a sedentary lifestyle.     Past Medical History:  Diagnosis Date   Anemia    Anxiety     Arthritis    Cataract    Chronic kidney disease    Depression    Diabetes mellitus without complication (HCC)    type II    GERD (gastroesophageal reflux disease)    Gout    Heart murmur    Hypertension    Myocardial infarction (HCC)    minor Mi- years ago -    Varicose veins of left lower extremity      Family History  Problem Relation Age of Onset   Breast cancer Mother    Stomach cancer Mother    Diabetes Mellitus II Maternal Aunt    Lung cancer Neg Hx    Lung disease Neg Hx     Current Medications[1]   Allergies[2]   Review of Systems  Constitutional: Negative.   Eyes: Negative.  Negative for blurred vision.  Respiratory: Negative.  Negative for shortness of breath.   Cardiovascular: Negative.  Negative for chest pain and palpitations.  Gastrointestinal: Negative.   Endocrine: Negative for polydipsia, polyphagia and polyuria.  Musculoskeletal: Negative.   Skin: Negative.   Neurological:  Negative for dizziness and headaches.  Psychiatric/Behavioral: Negative.       Today's Vitals   09/05/24 1022  BP: 112/60  Pulse: 68  Temp: 98.6 F (37 C)  TempSrc: Oral  Weight: 178 lb (80.7 kg)  Height: 5' 10 (1.778 m)  PainSc: 0-No pain   Body mass index is 25.54 kg/m.  Wt Readings from Last 3 Encounters:  09/05/24 178 lb (80.7 kg)  05/24/24 176 lb 6.4 oz (80 kg)  05/16/24 180 lb 6.4 oz (81.8 kg)    The ASCVD Risk score (Arnett DK, et al., 2019) failed to calculate for the following reasons:   The 2019 ASCVD risk score is only valid for ages 44 to 5   Risk score cannot be calculated because patient has a medical history suggesting prior/existing ASCVD   * - Cholesterol units were assumed  Objective:  Physical Exam Vitals and nursing note reviewed.  Constitutional:      Appearance: Normal appearance.  HENT:     Head: Normocephalic and atraumatic.     Nose:     Comments: Masked     Mouth/Throat:     Comments: Masked  Eyes:     Extraocular Movements:  Extraocular movements intact.  Cardiovascular:     Rate and Rhythm: Normal rate and regular rhythm.     Heart sounds: Normal heart sounds.  Pulmonary:     Effort: Pulmonary effort is normal.     Breath sounds: Normal breath sounds.  Musculoskeletal:     Cervical back: Normal range of motion.  Skin:    General: Skin is warm.  Neurological:     General: No focal deficit present.     Mental Status: He is alert.  Psychiatric:        Mood and Affect: Mood normal.         Assessment And Plan:   Assessment & Plan Type 2 diabetes mellitus with stage 3b chronic kidney disease, with long-term current use of insulin  (HCC)  Mixed hyperlipidemia  Hypertensive heart and renal disease with renal failure, stage 1 through stage 4 or unspecified chronic kidney disease, without heart failure  Centrilobular emphysema (HCC)   Assessment & Plan Type 2  diabetes mellitus with stage 3b chronic kidney disease Blood sugars stable, occasional hypoglycemia likely from reduced intake. No recent gout attacks. Spinach intake noted for kidney stone risk. - Adjust insulin  based on blood sugar and intake. - Checked liver and kidney function. - Checked blood count for anemia. - Refilled allopurinol  prescription. - Sent tetanus vaccine prescription to pharmacy.  Hypertensive heart and chronic kidney disease stage 3b Amlodipine  effective for blood pressure, no ankle swelling reported. - Continue current antihypertensive regimen including amlodipine .  Anemia due to stage 3b chronic kidney disease Anemia previously treated, current status assessed with blood count. - Checked blood count to assess current anemia status.  Secondary chronic gout, right foot No recent gout attacks, on allopurinol . - Refilled allopurinol  prescription.  General Health Maintenance Discussed tetanus vaccination importance. - Sent tetanus vaccine prescription to pharmacy.   No orders of the defined  types were placed in this encounter.  Return for controlled DM check-4 months.  Patient was given opportunity to ask questions. Patient verbalized understanding of the plan and was able to repeat key elements of the plan. All questions were answered to their satisfaction.    I, [name], MD, have reviewed all documentation for this visit. The documentation on 09/05/2024 for the exam, diagnosis, procedures, and orders are all accurate and complete.   IF YOU HAVE BEEN REFERRED TO A SPECIALIST, IT MAY TAKE 1-2 WEEKS TO SCHEDULE/PROCESS THE REFERRAL. IF YOU HAVE NOT HEARD FROM US /SPECIALIST IN TWO WEEKS, PLEASE GIVE US  A CALL AT [phone].        [1]  Current Outpatient Medications:    acetaminophen  (TYLENOL ) 650 MG CR tablet, Take 650 mg by mouth every 8 (eight) hours as needed for pain. , Disp: , Rfl:    allopurinol  (ZYLOPRIM ) 100 MG tablet, TAKE 1 TABLET(100 MG) BY MOUTH TWICE DAILY, Disp: 180 tablet, Rfl: 1   amLODipine  (NORVASC ) 5 MG tablet, TAKE 1 TABLET(5 MG) BY MOUTH DAILY, Disp: 90 tablet, Rfl: 1   aspirin  81 MG tablet, Take 81 mg by mouth daily. , Disp: , Rfl:    atorvastatin  (LIPITOR) 20 MG tablet, TAKE 1 TABLET(20 MG) BY MOUTH DAILY, Disp: 30 tablet, Rfl: 11   B-D ULTRAFINE III SHORT PEN 31G X 8 MM MISC, USE AS DIRECTED FOUR TIMES DAILY, Disp: 100 each, Rfl: 3   benzonatate  (TESSALON ) 100 MG capsule, Take 1 capsule (100 mg total) by mouth every 8 (eight) hours., Disp: 21 capsule, Rfl: 0   carboxymethylcellulose (REFRESH PLUS) 0.5 % SOLN, Place 1 drop into both eyes 3 (three) times daily as needed (Watery eyes). , Disp: , Rfl:    carvedilol  (COREG ) 12.5 MG tablet, Take 12.5 mg by mouth 2 (two) times daily with a meal., Disp: , Rfl:    ferrous sulfate 325 (65 FE) MG tablet, Take 325 mg by mouth daily with breakfast., Disp: , Rfl:    fluticasone  (FLONASE ) 50 MCG/ACT nasal spray, Place 2 sprays into both nostrils daily., Disp: 16 g, Rfl: 2   furosemide  (LASIX) 40 MG tablet, Take 40 mg by mouth daily as needed for fluid or edema. 2 Tablets by mouth daily as needed, Disp: , Rfl:    Glucagon  (GVOKE HYPOPEN  2-PACK) 0.5 MG/0.1ML SOAJ, Inject 0.1 mLs into the skin as needed (inject SQ under the skin for BS less than 70)., Disp: 0.2 mL, Rfl: 1   glucose blood (TRUE METRIX BLOOD GLUCOSE TEST) test strip, Use as directed to check blood sugars 3 times per  e11.65, Disp: 300 each, Rfl: 5   insulin  degludec (TRESIBA  FLEXTOUCH) 200 UNIT/ML FlexTouch Pen, Inject 18 Units into the skin daily., Disp: 27 mL, Rfl: 1   Insulin  Pen Needle 32G X 8 MM MISC, Use as directed, Disp: 100 each, Rfl: 2   Omega-3 Fatty Acids (FISH OIL OMEGA-3 PO), Take 1,200 mg by mouth daily. , Disp: , Rfl:    ondansetron  (ZOFRAN ) 4 MG tablet, TAKE 1 TABLET(4 MG) BY MOUTH DAILY AS NEEDED FOR NAUSEA OR VOMITING, Disp: 30 tablet, Rfl: 0   pantoprazole  (PROTONIX ) 40 MG tablet, TAKE 1 TABLET(40 MG) BY MOUTH DAILY, Disp: 90 tablet, Rfl: 0   sodium bicarbonate  650 MG tablet, Take 1,300 mg by mouth 2 (two) times daily., Disp: , Rfl:    TRUEplus Lancets 30G MISC, USE AS DIRECTED TO TEST BLOOD SUGAR THREE TIMES DAILY, Disp: 300 each, Rfl: 11   vitamin B-12 (CYANOCOBALAMIN ) 1000 MCG tablet, Take 1,000 mcg by mouth daily., Disp: , Rfl:  [2] No Known Allergies

## 2024-09-13 LAB — OPHTHALMOLOGY REPORT-SCANNED

## 2024-09-17 NOTE — Assessment & Plan Note (Signed)
 Chronic.  Pulmonary input appreciated. Most recent Pulmonary notes reviewed, since he has been asymptomatic, he only requires prn Pulmonary follow up at this time. Most recent CT chest reviewed, he does have 4mm nodules, based on Fleischner society guidelines, these require no further follow up.

## 2024-09-17 NOTE — Assessment & Plan Note (Signed)
 Chronic, LDL goal is less than 70.  He is now on atorvastatin . Will check fasting lipid panel at his next visit.

## 2024-09-17 NOTE — Assessment & Plan Note (Signed)
 No recent gouty attacks. Currently on allopurinol .  - Refill allopurinol .

## 2024-09-17 NOTE — Assessment & Plan Note (Signed)
 Chronic, LDL goal is less than 70.  He will continue with ASA 81mg  daily and atorvastatin  20mg  daily.  - Follow heart healthy lifestyle - Maintain an active lifestyle

## 2024-09-17 NOTE — Assessment & Plan Note (Signed)
 Chronic, controlled. Blood pressure managed with amlodipine  5 mg daily. Concerns about potential side effects like leg swelling, especially in summer. Kidney function stable per nephrologist.

## 2024-09-17 NOTE — Assessment & Plan Note (Signed)
 Chronic.  Blood sugars stable, occasional hypoglycemia likely from reduced intake. No recent gout attacks. Spinach intake noted for kidney stone risk. - Adjust insulin  based on blood sugar and intake. - Checked liver and kidney function. - Checked blood count for anemia. - Refilled allopurinol  prescription.

## 2024-09-17 NOTE — Assessment & Plan Note (Signed)
 Anemia previously treated, current status assessed with blood count. - Checked blood count to assess current anemia status.

## 2024-09-17 NOTE — Assessment & Plan Note (Signed)
 [facility] states he must receive Tdap at pharmacy.  - Rx Boostrix  sent to pharmacy.

## 2024-09-27 ENCOUNTER — Telehealth: Payer: Self-pay

## 2024-09-27 NOTE — Telephone Encounter (Signed)
 Called patient to inform their  tresiba  and pen needles patient assistance  is ready for pick up, LVM. Patient made aware they have 1 week to pick up patient assistance.

## 2024-11-08 ENCOUNTER — Ambulatory Visit: Admitting: Podiatry

## 2025-01-04 ENCOUNTER — Ambulatory Visit: Payer: Self-pay | Admitting: Internal Medicine

## 2025-06-20 ENCOUNTER — Ambulatory Visit: Payer: Self-pay
